# Patient Record
Sex: Female | Born: 1960 | Race: Black or African American | Hispanic: No | Marital: Married | Smoking: Never smoker
Health system: Southern US, Community
[De-identification: ages and names within clinical notes are randomized; demographics above are authoritative.]

## PROBLEM LIST (undated history)

## (undated) ENCOUNTER — Emergency Department (HOSPITAL_COMMUNITY)

## (undated) DIAGNOSIS — M069 Rheumatoid arthritis, unspecified: Secondary | ICD-10-CM

## (undated) DIAGNOSIS — K219 Gastro-esophageal reflux disease without esophagitis: Secondary | ICD-10-CM

## (undated) DIAGNOSIS — E079 Disorder of thyroid, unspecified: Secondary | ICD-10-CM

## (undated) DIAGNOSIS — D239 Other benign neoplasm of skin, unspecified: Secondary | ICD-10-CM

## (undated) DIAGNOSIS — Z833 Family history of diabetes mellitus: Secondary | ICD-10-CM

## (undated) DIAGNOSIS — E039 Hypothyroidism, unspecified: Secondary | ICD-10-CM

## (undated) DIAGNOSIS — Z780 Asymptomatic menopausal state: Secondary | ICD-10-CM

## (undated) DIAGNOSIS — T7840XA Allergy, unspecified, initial encounter: Secondary | ICD-10-CM

## (undated) DIAGNOSIS — E876 Hypokalemia: Secondary | ICD-10-CM

## (undated) DIAGNOSIS — J069 Acute upper respiratory infection, unspecified: Secondary | ICD-10-CM

## (undated) DIAGNOSIS — M25512 Pain in left shoulder: Secondary | ICD-10-CM

## (undated) DIAGNOSIS — Z8249 Family history of ischemic heart disease and other diseases of the circulatory system: Secondary | ICD-10-CM

## (undated) DIAGNOSIS — D649 Anemia, unspecified: Secondary | ICD-10-CM

## (undated) DIAGNOSIS — I10 Essential (primary) hypertension: Secondary | ICD-10-CM

## (undated) DIAGNOSIS — R209 Unspecified disturbances of skin sensation: Secondary | ICD-10-CM

## (undated) DIAGNOSIS — IMO0002 Reserved for concepts with insufficient information to code with codable children: Secondary | ICD-10-CM

## (undated) DIAGNOSIS — G4730 Sleep apnea, unspecified: Secondary | ICD-10-CM

## (undated) DIAGNOSIS — Z8049 Family history of malignant neoplasm of other genital organs: Secondary | ICD-10-CM

## (undated) DIAGNOSIS — K5730 Diverticulosis of large intestine without perforation or abscess without bleeding: Secondary | ICD-10-CM

## (undated) DIAGNOSIS — S139XXA Sprain of joints and ligaments of unspecified parts of neck, initial encounter: Secondary | ICD-10-CM

## (undated) DIAGNOSIS — R109 Unspecified abdominal pain: Secondary | ICD-10-CM

## (undated) HISTORY — DX: Pain in left shoulder: M25.512

## (undated) HISTORY — DX: Acute upper respiratory infection, unspecified: J06.9

## (undated) HISTORY — DX: Hypokalemia: E87.6

## (undated) HISTORY — DX: Allergy, unspecified, initial encounter: T78.40XA

## (undated) HISTORY — DX: Essential (primary) hypertension: I10

## (undated) HISTORY — PX: SHOULDER SURGERY: SHX246

## (undated) HISTORY — PX: ABDOMINAL HYSTERECTOMY: SHX81

## (undated) HISTORY — DX: Family history of diabetes mellitus: Z83.3

## (undated) HISTORY — DX: Sprain of joints and ligaments of unspecified parts of neck, initial encounter: S13.9XXA

## (undated) HISTORY — DX: Rheumatoid arthritis, unspecified: M06.9

## (undated) HISTORY — PX: COLONOSCOPY: SHX174

## (undated) HISTORY — DX: Family history of ischemic heart disease and other diseases of the circulatory system: Z82.49

## (undated) HISTORY — DX: Gastro-esophageal reflux disease without esophagitis: K21.9

## (undated) HISTORY — DX: Unspecified disturbances of skin sensation: R20.9

## (undated) HISTORY — DX: Unspecified abdominal pain: R10.9

## (undated) HISTORY — DX: Family history of malignant neoplasm of other genital organs: Z80.49

## (undated) HISTORY — DX: Diverticulosis of large intestine without perforation or abscess without bleeding: K57.30

## (undated) HISTORY — DX: Asymptomatic menopausal state: Z78.0

## (undated) HISTORY — DX: Other benign neoplasm of skin, unspecified: D23.9

## (undated) HISTORY — DX: Sleep apnea, unspecified: G47.30

## (undated) HISTORY — DX: Reserved for concepts with insufficient information to code with codable children: IMO0002

## (undated) HISTORY — DX: Anemia, unspecified: D64.9

## (undated) HISTORY — DX: Disorder of thyroid, unspecified: E07.9

---

## 2000-04-19 HISTORY — PX: TOTAL ABDOMINAL HYSTERECTOMY W/ BILATERAL SALPINGOOPHORECTOMY: SHX83

## 2002-08-08 ENCOUNTER — Other Ambulatory Visit: Admission: RE | Admit: 2002-08-08 | Discharge: 2002-08-08 | Payer: Self-pay | Admitting: Gynecology

## 2002-08-29 ENCOUNTER — Encounter: Admission: RE | Admit: 2002-08-29 | Discharge: 2002-08-29 | Payer: Self-pay | Admitting: Gynecology

## 2002-08-29 ENCOUNTER — Encounter: Payer: Self-pay | Admitting: Gynecology

## 2003-01-03 ENCOUNTER — Inpatient Hospital Stay (HOSPITAL_COMMUNITY): Admission: AD | Admit: 2003-01-03 | Discharge: 2003-01-06 | Payer: Self-pay | Admitting: Obstetrics & Gynecology

## 2003-01-03 ENCOUNTER — Encounter (INDEPENDENT_AMBULATORY_CARE_PROVIDER_SITE_OTHER): Payer: Self-pay

## 2003-04-20 HISTORY — PX: BUNIONECTOMY: SHX129

## 2003-11-20 ENCOUNTER — Other Ambulatory Visit: Admission: RE | Admit: 2003-11-20 | Discharge: 2003-11-20 | Payer: Self-pay | Admitting: Obstetrics & Gynecology

## 2004-03-11 ENCOUNTER — Inpatient Hospital Stay (HOSPITAL_COMMUNITY): Admission: AD | Admit: 2004-03-11 | Discharge: 2004-03-17 | Payer: Self-pay | Admitting: Obstetrics & Gynecology

## 2004-03-11 ENCOUNTER — Encounter (INDEPENDENT_AMBULATORY_CARE_PROVIDER_SITE_OTHER): Payer: Self-pay | Admitting: *Deleted

## 2004-03-12 ENCOUNTER — Encounter (INDEPENDENT_AMBULATORY_CARE_PROVIDER_SITE_OTHER): Payer: Self-pay | Admitting: *Deleted

## 2004-03-12 ENCOUNTER — Encounter (INDEPENDENT_AMBULATORY_CARE_PROVIDER_SITE_OTHER): Payer: Self-pay | Admitting: Specialist

## 2004-12-22 ENCOUNTER — Other Ambulatory Visit: Admission: RE | Admit: 2004-12-22 | Discharge: 2004-12-22 | Payer: Self-pay | Admitting: Obstetrics & Gynecology

## 2006-01-07 ENCOUNTER — Ambulatory Visit: Payer: Self-pay | Admitting: Family Medicine

## 2006-01-17 ENCOUNTER — Ambulatory Visit: Payer: Self-pay | Admitting: Family Medicine

## 2006-01-28 ENCOUNTER — Ambulatory Visit: Payer: Self-pay | Admitting: Family Medicine

## 2006-02-04 ENCOUNTER — Ambulatory Visit: Payer: Self-pay | Admitting: Family Medicine

## 2006-02-07 ENCOUNTER — Ambulatory Visit: Payer: Self-pay | Admitting: Family Medicine

## 2006-02-07 LAB — CONVERTED CEMR LAB
BUN: 13 mg/dL (ref 6–23)
CO2: 29 meq/L (ref 19–32)
Calcium: 8.6 mg/dL (ref 8.4–10.5)
Chloride: 108 meq/L (ref 96–112)
Creatinine, Ser: 1.2 mg/dL (ref 0.4–1.2)
GFR calc non Af Amer: 52 mL/min
Glomerular Filtration Rate, Af Am: 62 mL/min/{1.73_m2}
Glucose, Bld: 106 mg/dL — ABNORMAL HIGH (ref 70–99)
Potassium: 3 meq/L — ABNORMAL LOW (ref 3.5–5.1)
Sodium: 144 meq/L (ref 135–145)

## 2006-02-16 ENCOUNTER — Ambulatory Visit: Payer: Self-pay | Admitting: Family Medicine

## 2006-02-16 LAB — CONVERTED CEMR LAB: Potassium: 3.8 meq/L (ref 3.5–5.1)

## 2006-03-03 ENCOUNTER — Ambulatory Visit: Payer: Self-pay | Admitting: Family Medicine

## 2006-03-03 LAB — CONVERTED CEMR LAB
BUN: 15 mg/dL (ref 6–23)
CO2: 27 meq/L (ref 19–32)
Calcium: 8.9 mg/dL (ref 8.4–10.5)
Chloride: 106 meq/L (ref 96–112)
Creatinine, Ser: 1.1 mg/dL (ref 0.4–1.2)
GFR calc non Af Amer: 57 mL/min
Glomerular Filtration Rate, Af Am: 69 mL/min/{1.73_m2}
Glucose, Bld: 74 mg/dL (ref 70–99)
H Pylori IgG: NEGATIVE
Potassium: 4.1 meq/L (ref 3.5–5.1)
Sodium: 140 meq/L (ref 135–145)

## 2006-03-23 ENCOUNTER — Encounter: Admission: RE | Admit: 2006-03-23 | Discharge: 2006-06-21 | Payer: Self-pay | Admitting: Internal Medicine

## 2006-04-25 ENCOUNTER — Ambulatory Visit: Payer: Self-pay | Admitting: Family Medicine

## 2006-05-18 DIAGNOSIS — I10 Essential (primary) hypertension: Secondary | ICD-10-CM | POA: Insufficient documentation

## 2006-06-22 ENCOUNTER — Ambulatory Visit: Payer: Self-pay | Admitting: Family Medicine

## 2006-07-01 ENCOUNTER — Ambulatory Visit: Payer: Self-pay | Admitting: Family Medicine

## 2006-07-07 ENCOUNTER — Encounter: Admission: RE | Admit: 2006-07-07 | Discharge: 2006-07-07 | Payer: Self-pay | Admitting: Family Medicine

## 2006-07-28 ENCOUNTER — Ambulatory Visit: Payer: Self-pay | Admitting: Family Medicine

## 2006-07-28 LAB — CONVERTED CEMR LAB
BUN: 15 mg/dL (ref 6–23)
CO2: 29 meq/L (ref 19–32)
Calcium: 9 mg/dL (ref 8.4–10.5)
Chloride: 108 meq/L (ref 96–112)
Creatinine, Ser: 0.9 mg/dL (ref 0.4–1.2)
GFR calc Af Amer: 87 mL/min
GFR calc non Af Amer: 72 mL/min
Glucose, Bld: 81 mg/dL (ref 70–99)
Potassium: 3.9 meq/L (ref 3.5–5.1)
Sodium: 143 meq/L (ref 135–145)

## 2006-08-26 ENCOUNTER — Ambulatory Visit: Payer: Self-pay | Admitting: Internal Medicine

## 2006-09-28 ENCOUNTER — Telehealth (INDEPENDENT_AMBULATORY_CARE_PROVIDER_SITE_OTHER): Payer: Self-pay | Admitting: Family Medicine

## 2007-01-10 ENCOUNTER — Encounter (INDEPENDENT_AMBULATORY_CARE_PROVIDER_SITE_OTHER): Payer: Self-pay | Admitting: Family Medicine

## 2007-01-13 ENCOUNTER — Ambulatory Visit: Payer: Self-pay | Admitting: Family Medicine

## 2007-01-16 ENCOUNTER — Telehealth (INDEPENDENT_AMBULATORY_CARE_PROVIDER_SITE_OTHER): Payer: Self-pay | Admitting: *Deleted

## 2007-01-16 LAB — CONVERTED CEMR LAB
BUN: 14 mg/dL (ref 6–23)
CO2: 28 meq/L (ref 19–32)
Calcium: 9.2 mg/dL (ref 8.4–10.5)
Chloride: 107 meq/L (ref 96–112)
Creatinine, Ser: 0.8 mg/dL (ref 0.4–1.2)
GFR calc Af Amer: 99 mL/min
GFR calc non Af Amer: 82 mL/min
Glucose, Bld: 95 mg/dL (ref 70–99)
Potassium: 3.5 meq/L (ref 3.5–5.1)
Sodium: 142 meq/L (ref 135–145)

## 2007-02-17 ENCOUNTER — Ambulatory Visit: Payer: Self-pay | Admitting: Family Medicine

## 2007-02-24 ENCOUNTER — Encounter (INDEPENDENT_AMBULATORY_CARE_PROVIDER_SITE_OTHER): Payer: Self-pay | Admitting: *Deleted

## 2007-02-24 ENCOUNTER — Telehealth (INDEPENDENT_AMBULATORY_CARE_PROVIDER_SITE_OTHER): Payer: Self-pay | Admitting: *Deleted

## 2007-02-24 LAB — CONVERTED CEMR LAB
BUN: 11 mg/dL (ref 6–23)
CO2: 31 meq/L (ref 19–32)
Calcium: 8.9 mg/dL (ref 8.4–10.5)
Chloride: 108 meq/L (ref 96–112)
Creatinine, Ser: 0.8 mg/dL (ref 0.4–1.2)
GFR calc Af Amer: 99 mL/min
GFR calc non Af Amer: 82 mL/min
Glucose, Bld: 77 mg/dL (ref 70–99)
Potassium: 3.7 meq/L (ref 3.5–5.1)
Sodium: 142 meq/L (ref 135–145)

## 2007-04-28 ENCOUNTER — Telehealth (INDEPENDENT_AMBULATORY_CARE_PROVIDER_SITE_OTHER): Payer: Self-pay | Admitting: *Deleted

## 2007-04-28 ENCOUNTER — Ambulatory Visit: Payer: Self-pay | Admitting: Family Medicine

## 2007-04-28 DIAGNOSIS — J069 Acute upper respiratory infection, unspecified: Secondary | ICD-10-CM | POA: Insufficient documentation

## 2007-04-29 LAB — CONVERTED CEMR LAB
BUN: 15 mg/dL (ref 6–23)
CO2: 29 meq/L (ref 19–32)
Calcium: 9 mg/dL (ref 8.4–10.5)
Chloride: 102 meq/L (ref 96–112)
Creatinine, Ser: 0.9 mg/dL (ref 0.4–1.2)
GFR calc Af Amer: 87 mL/min
GFR calc non Af Amer: 72 mL/min
Glucose, Bld: 80 mg/dL (ref 70–99)
Potassium: 3.4 meq/L — ABNORMAL LOW (ref 3.5–5.1)
Sodium: 139 meq/L (ref 135–145)

## 2007-05-15 ENCOUNTER — Telehealth (INDEPENDENT_AMBULATORY_CARE_PROVIDER_SITE_OTHER): Payer: Self-pay | Admitting: *Deleted

## 2007-05-17 ENCOUNTER — Telehealth (INDEPENDENT_AMBULATORY_CARE_PROVIDER_SITE_OTHER): Payer: Self-pay | Admitting: *Deleted

## 2007-05-17 ENCOUNTER — Encounter (INDEPENDENT_AMBULATORY_CARE_PROVIDER_SITE_OTHER): Payer: Self-pay | Admitting: Family Medicine

## 2007-05-17 ENCOUNTER — Telehealth (INDEPENDENT_AMBULATORY_CARE_PROVIDER_SITE_OTHER): Payer: Self-pay | Admitting: Family Medicine

## 2007-06-27 ENCOUNTER — Ambulatory Visit: Payer: Self-pay | Admitting: Family Medicine

## 2007-08-24 ENCOUNTER — Encounter: Payer: Self-pay | Admitting: Family Medicine

## 2007-11-02 ENCOUNTER — Ambulatory Visit: Payer: Self-pay | Admitting: Family Medicine

## 2008-01-23 ENCOUNTER — Emergency Department (HOSPITAL_COMMUNITY): Admission: EM | Admit: 2008-01-23 | Discharge: 2008-01-24 | Payer: Self-pay | Admitting: Emergency Medicine

## 2008-01-29 ENCOUNTER — Ambulatory Visit: Payer: Self-pay | Admitting: Family Medicine

## 2008-01-29 DIAGNOSIS — R209 Unspecified disturbances of skin sensation: Secondary | ICD-10-CM | POA: Insufficient documentation

## 2008-01-29 DIAGNOSIS — S139XXA Sprain of joints and ligaments of unspecified parts of neck, initial encounter: Secondary | ICD-10-CM | POA: Insufficient documentation

## 2008-01-29 DIAGNOSIS — IMO0002 Reserved for concepts with insufficient information to code with codable children: Secondary | ICD-10-CM | POA: Insufficient documentation

## 2008-04-29 ENCOUNTER — Encounter: Payer: Self-pay | Admitting: Family Medicine

## 2008-04-30 ENCOUNTER — Ambulatory Visit: Payer: Self-pay | Admitting: Family Medicine

## 2008-04-30 DIAGNOSIS — D239 Other benign neoplasm of skin, unspecified: Secondary | ICD-10-CM | POA: Insufficient documentation

## 2008-05-01 ENCOUNTER — Encounter: Payer: Self-pay | Admitting: Family Medicine

## 2008-05-03 ENCOUNTER — Encounter (INDEPENDENT_AMBULATORY_CARE_PROVIDER_SITE_OTHER): Payer: Self-pay | Admitting: *Deleted

## 2008-05-13 ENCOUNTER — Encounter: Payer: Self-pay | Admitting: Family Medicine

## 2008-05-14 ENCOUNTER — Ambulatory Visit: Payer: Self-pay | Admitting: Family Medicine

## 2008-05-14 LAB — CONVERTED CEMR LAB
OCCULT 1: NEGATIVE
OCCULT 2: NEGATIVE
OCCULT 3: NEGATIVE

## 2008-05-15 ENCOUNTER — Encounter (INDEPENDENT_AMBULATORY_CARE_PROVIDER_SITE_OTHER): Payer: Self-pay | Admitting: *Deleted

## 2008-05-16 ENCOUNTER — Telehealth (INDEPENDENT_AMBULATORY_CARE_PROVIDER_SITE_OTHER): Payer: Self-pay | Admitting: *Deleted

## 2008-06-14 ENCOUNTER — Encounter: Payer: Self-pay | Admitting: Family Medicine

## 2008-07-10 ENCOUNTER — Encounter: Payer: Self-pay | Admitting: Internal Medicine

## 2008-07-31 ENCOUNTER — Ambulatory Visit: Payer: Self-pay | Admitting: Family Medicine

## 2008-07-31 DIAGNOSIS — Z862 Personal history of diseases of the blood and blood-forming organs and certain disorders involving the immune mechanism: Secondary | ICD-10-CM | POA: Insufficient documentation

## 2008-07-31 DIAGNOSIS — Z8639 Personal history of other endocrine, nutritional and metabolic disease: Secondary | ICD-10-CM

## 2008-08-01 LAB — CONVERTED CEMR LAB
BUN: 15 mg/dL (ref 6–23)
CO2: 31 meq/L (ref 19–32)
Calcium: 8.8 mg/dL (ref 8.4–10.5)
Chloride: 107 meq/L (ref 96–112)
Creatinine, Ser: 1 mg/dL (ref 0.4–1.2)
GFR calc non Af Amer: 76.24 mL/min (ref >60)
Glucose, Bld: 84 mg/dL (ref 70–99)
Potassium: 3.7 meq/L (ref 3.5–5.1)
Sodium: 143 meq/L (ref 135–145)

## 2008-08-02 ENCOUNTER — Telehealth (INDEPENDENT_AMBULATORY_CARE_PROVIDER_SITE_OTHER): Payer: Self-pay | Admitting: *Deleted

## 2008-08-02 ENCOUNTER — Encounter (INDEPENDENT_AMBULATORY_CARE_PROVIDER_SITE_OTHER): Payer: Self-pay | Admitting: *Deleted

## 2008-09-03 ENCOUNTER — Encounter: Payer: Self-pay | Admitting: Family Medicine

## 2008-09-13 DIAGNOSIS — K5730 Diverticulosis of large intestine without perforation or abscess without bleeding: Secondary | ICD-10-CM | POA: Insufficient documentation

## 2008-09-13 DIAGNOSIS — R1010 Upper abdominal pain, unspecified: Secondary | ICD-10-CM | POA: Insufficient documentation

## 2008-09-13 DIAGNOSIS — R109 Unspecified abdominal pain: Secondary | ICD-10-CM | POA: Insufficient documentation

## 2008-09-19 ENCOUNTER — Ambulatory Visit: Payer: Self-pay | Admitting: Internal Medicine

## 2008-09-19 DIAGNOSIS — R109 Unspecified abdominal pain: Secondary | ICD-10-CM | POA: Insufficient documentation

## 2008-09-25 ENCOUNTER — Ambulatory Visit: Payer: Self-pay | Admitting: Internal Medicine

## 2008-09-25 LAB — HM COLONOSCOPY

## 2008-12-11 ENCOUNTER — Ambulatory Visit: Payer: Self-pay | Admitting: Family Medicine

## 2008-12-11 DIAGNOSIS — R519 Headache, unspecified: Secondary | ICD-10-CM | POA: Insufficient documentation

## 2008-12-11 DIAGNOSIS — R51 Headache: Secondary | ICD-10-CM | POA: Insufficient documentation

## 2008-12-11 DIAGNOSIS — R42 Dizziness and giddiness: Secondary | ICD-10-CM | POA: Insufficient documentation

## 2008-12-11 LAB — CONVERTED CEMR LAB
BUN: 17 mg/dL (ref 6–23)
Basophils Absolute: 0.1 10*3/uL (ref 0.0–0.1)
Basophils Relative: 1.3 % (ref 0.0–3.0)
CO2: 31 meq/L (ref 19–32)
Calcium: 8.8 mg/dL (ref 8.4–10.5)
Chloride: 107 meq/L (ref 96–112)
Creatinine, Ser: 0.9 mg/dL (ref 0.4–1.2)
Eosinophils Absolute: 0.1 10*3/uL (ref 0.0–0.7)
Eosinophils Relative: 2.3 % (ref 0.0–5.0)
GFR calc non Af Amer: 85.97 mL/min (ref >60)
Glucose, Bld: 76 mg/dL (ref 70–99)
HCT: 36.3 % (ref 36.0–46.0)
Hemoglobin: 12 g/dL (ref 12.0–15.0)
Lymphocytes Relative: 36.6 % (ref 12.0–46.0)
Lymphs Abs: 1.5 10*3/uL (ref 0.7–4.0)
MCHC: 33.2 g/dL (ref 30.0–36.0)
MCV: 86.7 fL (ref 78.0–100.0)
Monocytes Absolute: 0.3 10*3/uL (ref 0.1–1.0)
Monocytes Relative: 6.6 % (ref 3.0–12.0)
Neutro Abs: 2.2 10*3/uL (ref 1.4–7.7)
Neutrophils Relative %: 53.2 % (ref 43.0–77.0)
Platelets: 280 10*3/uL (ref 150.0–400.0)
Potassium: 3.6 meq/L (ref 3.5–5.1)
RBC: 4.19 M/uL (ref 3.87–5.11)
RDW: 13.1 % (ref 11.5–14.6)
Sed Rate: 9 mm/hr (ref 0–22)
Sodium: 142 meq/L (ref 135–145)
TSH: 1.7 microintl units/mL (ref 0.35–5.50)
WBC: 4.2 10*3/uL — ABNORMAL LOW (ref 4.5–10.5)

## 2008-12-13 ENCOUNTER — Encounter (INDEPENDENT_AMBULATORY_CARE_PROVIDER_SITE_OTHER): Payer: Self-pay | Admitting: *Deleted

## 2008-12-17 ENCOUNTER — Telehealth (INDEPENDENT_AMBULATORY_CARE_PROVIDER_SITE_OTHER): Payer: Self-pay | Admitting: *Deleted

## 2008-12-17 LAB — CONVERTED CEMR LAB

## 2008-12-27 ENCOUNTER — Encounter (INDEPENDENT_AMBULATORY_CARE_PROVIDER_SITE_OTHER): Payer: Self-pay | Admitting: *Deleted

## 2009-01-02 ENCOUNTER — Telehealth (INDEPENDENT_AMBULATORY_CARE_PROVIDER_SITE_OTHER): Payer: Self-pay | Admitting: *Deleted

## 2009-01-21 ENCOUNTER — Ambulatory Visit: Payer: Self-pay | Admitting: Family Medicine

## 2009-02-07 ENCOUNTER — Telehealth (INDEPENDENT_AMBULATORY_CARE_PROVIDER_SITE_OTHER): Payer: Self-pay | Admitting: *Deleted

## 2009-02-11 ENCOUNTER — Encounter: Payer: Self-pay | Admitting: Family Medicine

## 2009-04-23 ENCOUNTER — Ambulatory Visit: Payer: Self-pay | Admitting: Family Medicine

## 2009-04-23 DIAGNOSIS — M1990 Unspecified osteoarthritis, unspecified site: Secondary | ICD-10-CM | POA: Insufficient documentation

## 2009-04-30 ENCOUNTER — Telehealth (INDEPENDENT_AMBULATORY_CARE_PROVIDER_SITE_OTHER): Payer: Self-pay | Admitting: *Deleted

## 2009-04-30 LAB — CONVERTED CEMR LAB
BUN: 14 mg/dL (ref 6–23)
CO2: 30 meq/L (ref 19–32)
Calcium: 9 mg/dL (ref 8.4–10.5)
Chloride: 105 meq/L (ref 96–112)
Creatinine, Ser: 0.9 mg/dL (ref 0.4–1.2)
GFR calc non Af Amer: 85.83 mL/min (ref >60)
Glucose, Bld: 81 mg/dL (ref 70–99)
Potassium: 3.2 meq/L — ABNORMAL LOW (ref 3.5–5.1)
Sodium: 141 meq/L (ref 135–145)

## 2009-05-08 ENCOUNTER — Telehealth (INDEPENDENT_AMBULATORY_CARE_PROVIDER_SITE_OTHER): Payer: Self-pay | Admitting: *Deleted

## 2009-05-13 ENCOUNTER — Telehealth (INDEPENDENT_AMBULATORY_CARE_PROVIDER_SITE_OTHER): Payer: Self-pay | Admitting: *Deleted

## 2009-05-19 ENCOUNTER — Encounter: Payer: Self-pay | Admitting: Family Medicine

## 2009-06-25 LAB — HM MAMMOGRAPHY: HM Mammogram: NORMAL

## 2009-07-02 LAB — CONVERTED CEMR LAB: Pap Smear: NORMAL

## 2009-09-23 ENCOUNTER — Telehealth (INDEPENDENT_AMBULATORY_CARE_PROVIDER_SITE_OTHER): Payer: Self-pay | Admitting: *Deleted

## 2009-09-24 ENCOUNTER — Ambulatory Visit: Payer: Self-pay | Admitting: Family Medicine

## 2009-09-24 DIAGNOSIS — R079 Chest pain, unspecified: Secondary | ICD-10-CM | POA: Insufficient documentation

## 2009-09-24 DIAGNOSIS — R12 Heartburn: Secondary | ICD-10-CM | POA: Insufficient documentation

## 2009-09-30 ENCOUNTER — Encounter (INDEPENDENT_AMBULATORY_CARE_PROVIDER_SITE_OTHER): Payer: Self-pay | Admitting: *Deleted

## 2009-11-03 ENCOUNTER — Ambulatory Visit: Payer: Self-pay | Admitting: Internal Medicine

## 2009-11-04 ENCOUNTER — Ambulatory Visit: Payer: Self-pay | Admitting: Internal Medicine

## 2009-11-06 ENCOUNTER — Ambulatory Visit (HOSPITAL_COMMUNITY): Admission: RE | Admit: 2009-11-06 | Discharge: 2009-11-06 | Payer: Self-pay | Admitting: Internal Medicine

## 2009-11-08 ENCOUNTER — Encounter: Payer: Self-pay | Admitting: Internal Medicine

## 2009-11-27 ENCOUNTER — Encounter: Payer: Self-pay | Admitting: Family Medicine

## 2009-12-25 ENCOUNTER — Telehealth: Payer: Self-pay | Admitting: Family Medicine

## 2010-03-04 ENCOUNTER — Ambulatory Visit: Payer: Self-pay | Admitting: Family Medicine

## 2010-03-04 ENCOUNTER — Encounter: Payer: Self-pay | Admitting: Family Medicine

## 2010-03-04 DIAGNOSIS — IMO0001 Reserved for inherently not codable concepts without codable children: Secondary | ICD-10-CM | POA: Insufficient documentation

## 2010-03-04 DIAGNOSIS — M2550 Pain in unspecified joint: Secondary | ICD-10-CM | POA: Insufficient documentation

## 2010-03-08 LAB — CONVERTED CEMR LAB
ANA Titer 1: 1:160 {titer} — ABNORMAL HIGH
Anti Nuclear Antibody(ANA): POSITIVE — AB
Rheumatoid fact SerPl-aCnc: 21 intl units/mL — ABNORMAL HIGH (ref 0–20)

## 2010-03-09 ENCOUNTER — Telehealth (INDEPENDENT_AMBULATORY_CARE_PROVIDER_SITE_OTHER): Payer: Self-pay | Admitting: *Deleted

## 2010-03-09 LAB — CONVERTED CEMR LAB
ALT: 16 units/L (ref 0–35)
AST: 16 units/L (ref 0–37)
Albumin: 3.7 g/dL (ref 3.5–5.2)
Alkaline Phosphatase: 66 units/L (ref 39–117)
BUN: 20 mg/dL (ref 6–23)
Basophils Absolute: 0 10*3/uL (ref 0.0–0.1)
Basophils Relative: 0.9 % (ref 0.0–3.0)
Bilirubin, Direct: 0.1 mg/dL (ref 0.0–0.3)
CO2: 29 meq/L (ref 19–32)
Calcium: 8.8 mg/dL (ref 8.4–10.5)
Chloride: 101 meq/L (ref 96–112)
Cholesterol: 181 mg/dL (ref 0–200)
Creatinine, Ser: 1 mg/dL (ref 0.4–1.2)
Eosinophils Absolute: 0.1 10*3/uL (ref 0.0–0.7)
Eosinophils Relative: 2 % (ref 0.0–5.0)
GFR calc non Af Amer: 79.39 mL/min (ref >60)
Glucose, Bld: 76 mg/dL (ref 70–99)
HCT: 36.8 % (ref 36.0–46.0)
HDL: 42.1 mg/dL (ref >39.00)
Hemoglobin: 12.2 g/dL (ref 12.0–15.0)
LDL Cholesterol: 114 mg/dL — ABNORMAL HIGH (ref 0–99)
Lymphocytes Relative: 40.4 % (ref 12.0–46.0)
Lymphs Abs: 2 10*3/uL (ref 0.7–4.0)
MCHC: 33.3 g/dL (ref 30.0–36.0)
MCV: 86.3 fL (ref 78.0–100.0)
Monocytes Absolute: 0.3 10*3/uL (ref 0.1–1.0)
Monocytes Relative: 6.5 % (ref 3.0–12.0)
Neutro Abs: 2.5 10*3/uL (ref 1.4–7.7)
Neutrophils Relative %: 50.2 % (ref 43.0–77.0)
Platelets: 290 10*3/uL (ref 150.0–400.0)
Potassium: 3.2 meq/L — ABNORMAL LOW (ref 3.5–5.1)
RBC: 4.26 M/uL (ref 3.87–5.11)
RDW: 13.7 % (ref 11.5–14.6)
Sed Rate: 38 mm/hr — ABNORMAL HIGH (ref 0–22)
Sodium: 139 meq/L (ref 135–145)
TSH: 2 microintl units/mL (ref 0.35–5.50)
Total Bilirubin: 0.7 mg/dL (ref 0.3–1.2)
Total CHOL/HDL Ratio: 4
Total Protein: 7 g/dL (ref 6.0–8.3)
Triglycerides: 123 mg/dL (ref 0.0–149.0)
VLDL: 24.6 mg/dL (ref 0.0–40.0)
WBC: 5 10*3/uL (ref 4.5–10.5)

## 2010-03-19 ENCOUNTER — Encounter: Payer: Self-pay | Admitting: Family Medicine

## 2010-03-27 ENCOUNTER — Encounter: Payer: Self-pay | Admitting: Family Medicine

## 2010-04-23 ENCOUNTER — Encounter: Payer: Self-pay | Admitting: Family Medicine

## 2010-04-23 ENCOUNTER — Ambulatory Visit
Admission: RE | Admit: 2010-04-23 | Discharge: 2010-04-23 | Payer: Self-pay | Source: Home / Self Care | Attending: Family Medicine | Admitting: Family Medicine

## 2010-04-23 LAB — CONVERTED CEMR LAB
Inflenza A Ag: NEGATIVE
Influenza B Ag: NEGATIVE
Rapid Strep: NEGATIVE

## 2010-04-27 ENCOUNTER — Telehealth: Payer: Self-pay | Admitting: Family Medicine

## 2010-05-10 ENCOUNTER — Encounter: Payer: Self-pay | Admitting: Obstetrics & Gynecology

## 2010-05-17 LAB — CONVERTED CEMR LAB
ALT: 22 units/L (ref 0–35)
AST: 18 units/L (ref 0–37)
Albumin: 3.9 g/dL (ref 3.5–5.2)
Alkaline Phosphatase: 70 units/L (ref 39–117)
BUN: 18 mg/dL (ref 6–23)
Basophils Absolute: 0.1 10*3/uL (ref 0.0–0.1)
Basophils Relative: 2.2 % (ref 0.0–3.0)
Bilirubin, Direct: 0.1 mg/dL (ref 0.0–0.3)
CO2: 31 meq/L (ref 19–32)
Calcium: 9.4 mg/dL (ref 8.4–10.5)
Chloride: 103 meq/L (ref 96–112)
Cholesterol: 204 mg/dL (ref 0–200)
Creatinine, Ser: 0.9 mg/dL (ref 0.4–1.2)
Direct LDL: 128.5 mg/dL
Eosinophils Absolute: 0 10*3/uL (ref 0.0–0.7)
Eosinophils Relative: 0.9 % (ref 0.0–5.0)
GFR calc Af Amer: 86 mL/min
GFR calc non Af Amer: 71 mL/min
Glucose, Bld: 86 mg/dL (ref 70–99)
HCT: 38.6 % (ref 36.0–46.0)
HDL: 48.4 mg/dL (ref >39.0)
Hemoglobin: 13 g/dL (ref 12.0–15.0)
Lymphocytes Relative: 36.9 % (ref 12.0–46.0)
MCHC: 33.6 g/dL (ref 30.0–36.0)
MCV: 86.8 fL (ref 78.0–100.0)
Monocytes Absolute: 0.4 10*3/uL (ref 0.1–1.0)
Monocytes Relative: 7 % (ref 3.0–12.0)
Neutro Abs: 2.8 10*3/uL (ref 1.4–7.7)
Neutrophils Relative %: 53 % (ref 43.0–77.0)
Pap Smear: NORMAL
Platelets: 280 10*3/uL (ref 150–400)
Potassium: 3 meq/L — ABNORMAL LOW (ref 3.5–5.1)
RBC: 4.45 M/uL (ref 3.87–5.11)
RDW: 13.3 % (ref 11.5–14.6)
Sodium: 141 meq/L (ref 135–145)
TSH: 2.34 microintl units/mL (ref 0.35–5.50)
Total Bilirubin: 0.8 mg/dL (ref 0.3–1.2)
Total CHOL/HDL Ratio: 4.2
Total Protein: 8.2 g/dL (ref 6.0–8.3)
Triglycerides: 79 mg/dL (ref 0–149)
VLDL: 16 mg/dL (ref 0–40)
WBC: 5.3 10*3/uL (ref 4.5–10.5)

## 2010-05-19 NOTE — Progress Notes (Signed)
Summary: [facility] 9/8  Phone Note Outgoing Call Call back at Work Phone [phone] Call back at [phone]   Call placed by: Almeta Monas CMA Duncan Dull),  December 25, 2009 12:41 PM Details for Reason: Med Verification Summary of Call: Letter rcvd from the Insurance comp advising [place] that pt's meds are not being filled in accordance to direction from MD. Jeanene Erb pt to verify and make sure she is taking Losartan-Hctz 100-25 mg tab as directed. Almeta Monas CMA Duncan Dull)  December 25, 2009 12:43 PM   Follow-up for Phone Call        spk with patient and she said she is not taking her meds properly, says she takes them when she remembers. Advised importance of taking meds properly, pt voiced understanding. She said she was no having any problems and no concerns at the moment, says she will do better at taking meds. Adv pt I will let Dr. Laury Axon know and I may need to call back with further instruction. Pt voiced understanding.  Follow-up by: Almeta Monas CMA Duncan Dull),  December 25, 2009 1:07 PM

## 2010-05-19 NOTE — Progress Notes (Signed)
Summary: REFILL  Phone Note Refill Request Message from:  Fax from Pharmacy on [facility] [address] FAX [phone]  Refills Requested: Medication #1:  KLOR-CON M20 20 MEQ CR-TABS Take TWO  tabletS daily Initial call taken by: Barb Merino,  May 08, 2009 10:49 AM    Prescriptions: KLOR-CON M20 20 MEQ CR-TABS (POTASSIUM CHLORIDE CRYS CR) Take TWO  tabletS daily  #60 x 2   Entered by:   Army Fossa CMA   Authorized by:   Loreen Freud DO   Signed by:   Army Fossa CMA on 05/08/2009   Method used:   [facility]ron[facility]        [facility]  [address] [id number]       [address]       [place], [place]  [address]       Ph: [phone] or [phone]       Fax: [phone]   RxID:   [id number]

## 2010-05-19 NOTE — Progress Notes (Signed)
Summary: REFILL  Phone Note Refill Request Message from:  Fax from Pharmacy on [facility] [address] FAX [phone]  LOSARTAN-HCTZ 100-25MG   Initial call taken by: Barb Merino,  April 30, 2009 11:46 AM    Prescriptions: HYZAAR 100-25 MG TABS (LOSARTAN POTASSIUM-HCTZ) 1 by mouth once daily  #30 x 2   Entered by:   Army Fossa CMA   Authorized by:   Loreen Freud DO   Signed by:   Army Fossa CMA on 04/30/2009   Method used:   Electronically to        [facility]  [address] [id number] (retail)       [address]       [place], [place]  [address]       Ph: [phone] or [phone]       Fax: [phone]   RxID:   [id number]

## 2010-05-19 NOTE — Progress Notes (Signed)
Summary: Results 11/21  Phone Note Outgoing Call   Call placed by: Almeta Monas CMA Duncan Dull),  March 09, 2010 3:49 PM Call placed to: Patient Details for Reason: Results Summary of Call: Ideally your LDL (bad cholesterol) should be <_100___, your HDL (good cholesterol) should be >_50__ and your triglycerides should be< 150.  Diet and exercise will increase HDL and decrease the LDL and triglycerides. Read Dr. Danice Goltz book--Eat Drink and Be Healthy.  We will recheck labs in _3__ months.  We will discuss options then.    K is low----is pt taking potassium supplements?  med list says 2 daily.  She will need to increase to 3 a day if she is.             272,4  401.9  bmp, lipid, hep  + ANA---- with bodyaches----refer to rheumatology  Left message to call back...Marland KitchenMarland KitchenMarland Kitchen Almeta Monas CMA Duncan Dull)  March 09, 2010 3:52 PM   Follow-up for Phone Call        spk with pt and gave her results, pt is only taking 1 potassium pill and will increase to two. Will watch her diet and exercise and recheck labs in 3 mo. Follow-up by: Almeta Monas CMA Duncan Dull),  March 11, 2010 4:08 PM

## 2010-05-19 NOTE — Letter (Signed)
Summary: Patient Notice-Endo Biopsy Results   Gastroenterology  [address] [place], [place] [address]   Phone: [phone]  Fax: [phone]        November 08, 2009 MRN: [id number]    [name] [address] [place], [place]  [address]    Dear [name],  I am pleased to inform you that the biopsies taken during your recent endoscopic examination did not show any evidence of cancer upon pathologic examination.Mild inflammation due to reflux  Additional information/recommendations:  __No further action is needed at this time.  Please follow-up with      your primary care physician for your other healthcare needs.  __ Please call [phone] to schedule a return visit to review      your condition.  _x_ Continue with the treatment plan as outlined on the day of your      exam.  __ You should have a repeat endoscopic examination for this problem              in _ months/years.   Please call us if you are having persistent problems or have questions about your condition that have not been fully answered at this time.  Sincerely,  Hart Carwin MD  This letter has been electronically signed by your physician.  Appended Document: Patient Notice-Endo Biopsy Results letter mailed

## 2010-05-19 NOTE — Progress Notes (Signed)
Summary: Lab Results   Phone Note Outgoing Call   Summary of Call: Regarding lab results, LMTCB with a female:  make sure she is taking her potassium two times a day ---  K is still low  Initial call taken by: Army Fossa CMA,  April 30, 2009 2:02 PM  Follow-up for Phone Call        pt was only taking once daily. instructed pt to take two times a day.

## 2010-05-19 NOTE — Progress Notes (Signed)
Summary: refill  Phone Note Refill Request Message from:  Fax from Pharmacy on [address] fax [phone]  losartan-hcttz 100-25mg    Initial call taken by: Barb Merino,  May 13, 2009 10:10 AM    Prescriptions: HYZAAR 100-25 MG TABS (LOSARTAN POTASSIUM-HCTZ) 1 by mouth once daily  #30 x 2   Entered by:   Army Fossa CMA   Authorized by:   Loreen Freud DO   Signed by:   Army Fossa CMA on 05/13/2009   Method used:   Faxed to ...       [facility]  [address] [id number])       [address]       [place], [place]  [address]       Ph: [phone] or [phone]       Fax: [phone]   RxID:   [id number]

## 2010-05-19 NOTE — Miscellaneous (Signed)
Summary: nexium-rx and carafate rx  Clinical Lists Changes  Medications: Added new medication of NEXIUM 40 MG  CPDR (ESOMEPRAZOLE MAGNESIUM) 1 capsule twice a day 30 minutes before meals - Signed Added new medication of CARAFATE 1 GM/10ML  SUSP (SUCRALFATE) take 10ml by mouth two times a day for 2 weeks - Signed Rx of NEXIUM 40 MG  CPDR (ESOMEPRAZOLE MAGNESIUM) 1 capsule twice a day 30 minutes before meals;  #60 x 1;  Signed;  Entered by: Greer Ee RN;  Authorized by: Hart Carwin MD;  Method used: Electronically [facility]  [address] [id number] [address], [place], [place]  [address], Ph: [phone] or [phone], Fax: [phone] Rx of CARAFATE 1 GM/10ML  SUSP (SUCRALFATE) take 10ml by mouth two times a day for 2 weeks;  #12oz x 1;  Signed;  Entered by: Greer Ee RN;  Authorized by: Hart Carwin MD;  Method used: Electronically to [facility]  [address] [facility] [address] [place], [place], [place]  [address], Ph: [phone] or [phone], Fax: [phone]    Prescriptions: CARAFATE 1 GM/10ML  SUSP (SUCRALFATE) take 10ml by mouth two times a day for 2 weeks  #12oz x 1   Entered by:   Greer Ee RN   Authorized by:   Hart Carwin MD   Signed by:   Greer Ee RN on 11/04/2009   Method used:   Electronically to        [facility]  [address] [id number] (retail)       [address]       [place], [place]  [address]       Ph: [phone] or [phone]       Fax: [phone]   RxID:   [id number] NEXIUM 40 MG  CPDR (ESOMEPRAZOLE MAGNESIUM) 1 capsule twice a day 30 minutes before meals  #60 x 1   Entered by:   Greer Ee RN   Authorized by:   Hart Carwin MD   Signed by:   Greer Ee RN on 11/04/2009   Method used:   Electronically to        [facility]  [address] [id number] (retail)       [address]       [place], [place]  [address]       Ph: [phone] or [phone]       Fax: [phone]   RxID:    [id number]   Appended Document: nexium-rx and carafate rx I have spoken to patient. Patient's insurance will not cover Nexium. However, they will approve Dexilant. I will send a new prescription for the Dexilant to pharmacy and will discontinue prescription for Nexium.   Clinical Lists Changes  Medications: Removed medication of NEXIUM 40 MG CPDR (ESOMEPRAZOLE MAGNESIUM) 1 by mouth once daily Removed medication of NEXIUM 40 MG  CPDR (ESOMEPRAZOLE MAGNESIUM) 1 capsule twice a day 30 minutes before meals Added new medication of DEXILANT 60 MG CPDR (DEXLANSOPRAZOLE) Take 1 tablet by mouth two times a day. PHARMACY-PLEASE D/C NEXIUM RX.Marland KitchenMarland KitchenINSURANCE WILL NOT COVER! - Signed Rx of DEXILANT 60 MG CPDR (DEXLANSOPRAZOLE) Take 1 tablet by mouth two times a day. PHARMACY-PLEASE D/C NEXIUM RX.Marland KitchenMarland KitchenINSURANCE WILL NOT COVER!;  #60 x 1;  Signed;  Entered by: Lamona Curl CMA (AAMA);  Authorized by: Hart Carwin MD;  Method used: Electronically to [facility]  [address] [phone]*, [address] [place], [place], [place]  [address], Ph: [phone] or [phone], Fax: [phone]    Prescriptions: DEXILANT 60 MG CPDR (DEXLANSOPRAZOLE) Take 1 tablet by mouth two times a day. PHARMACY-PLEASE D/C NEXIUM RX.Marland KitchenMarland KitchenINSURANCE WILL NOT COVER!  #60 x 1   Entered by:   Lamona Curl CMA (AAMA)   Authorized by:   Hart Carwin MD   Signed by:   Lamona Curl CMA (AAMA) on 11/05/2009   Method used:   Electronically to        [facility]  [address] [id number] (retail)       [address]       [place], [place]  [address]       Ph: [phone] or [phone]       Fax: [phone]   RxID:   [id number]

## 2010-05-19 NOTE — Assessment & Plan Note (Signed)
Summary: heartburn...as.   History of Present Illness Visit Type: consult  Primary GI MD: Lina Sar MD Primary Provider: Loreen Freud, DO Requesting Provider: Loreen Freud, DO Chief Complaint: GERD History of Present Illness:   This is a 50 year old African American female with a history of gastroesophageal reflux which has progressed over the past few months. She has had heartburn intermittently for many years. She has been overweight. She never smoked. She denies taking anti-inflammatory agents other than an occasional baby aspirin. Dr Laury Axon started the patient on Nexium 40 mg daily which resulted in 80% improvement. There is no family history of gallbladder disease. A CT Scan of the abdomen and pelvis completed last year showed diverticulosis of the left colon. I saw the patient last year for left lower quadrant abdominal pain. She is status post hysterectomy and BSO. A colonoscopy in June 2010 showed moderately severe diverticulosis. She has been treated with Levsin SL 0.125  and Levbid 0.375 mg.   GI Review of Systems    Reports acid reflux and  heartburn.      Denies abdominal pain, belching, bloating, chest pain, dysphagia with liquids, dysphagia with solids, loss of appetite, nausea, vomiting, vomiting blood, weight loss, and  weight gain.        Denies anal fissure, black tarry stools, change in bowel habit, constipation, diarrhea, diverticulosis, fecal incontinence, heme positive stool, hemorrhoids, irritable bowel syndrome, jaundice, light color stool, liver problems, rectal bleeding, and  rectal pain.    Current Medications (verified): 1)  Hyzaar 100-25 Mg Tabs (Losartan Potassium-Hctz) .Marland Kitchen.. 1 By Mouth Once Daily 2)  Klor-Con M20 20 Meq Cr-Tabs (Potassium Chloride Crys Cr) .... Take Two  Tablets Daily 3)  Estradiol 2 Mg Tabs (Estradiol) .... As Needed 4)  Hydrocodone-Acetaminophen 7.5-750 Mg Tabs (Hydrocodone-Acetaminophen) .Marland Kitchen.. 1 By Mouth Every 6 Hours As Needed 5)  Levsin  0.125 Mg  Tabs (Hyoscyamine Sulfate) .... Dissolve 1 Tablets Under Tongue As Need For Abdominal Pain 6)  Nexium 40 Mg Cpdr (Esomeprazole Magnesium) .Marland Kitchen.. 1 By Mouth Once Daily  Allergies (verified): No Known Drug Allergies  Past History:  Past Medical History: Reviewed history from 09/13/2008 and no changes required. Hypertension Post menopausal Left Shoulder pain  DIVERTICULOSIS, COLON (ICD-562.10) PELVIC PAIN, CHRONIC (ICD-789.09) HYPOKALEMIA, HX OF (ICD-V12.2) PREVENTIVE HEALTH CARE (ICD-V70.0) BENIGN NEOPLASM OF SKIN SITE UNSPECIFIED (ICD-216.9) FAMILY HISTORY OF CAD FEMALE 1ST DEGREE RELATIVE <50 (ICD-V17.3) FAMILY HISTORY OF CAD FEMALE 1ST DEGREE RELATIVE <60 (ICD-V16.49) PARESTHESIA, HANDS (ICD-782.0) KNEE SPRAIN, LEFT (ICD-844.9) NECK SPRAIN AND STRAIN (ICD-847.0) URI (ICD-465.9) WELL ADULT EXAM (ICD-V70.0) FAMILY HISTORY DIABETES 1ST DEGREE RELATIVE (ICD-V18.0) HYPERTENSION (ICD-401.9)    Past Surgical History: Reviewed history from 09/19/2008 and no changes required. TAH/BSO secondary to ovarian cyst-2002 Recurrent pelvis cyst-2005 Rt foot (bunionectomy)   left shoulder surgery: spurs  2/09---Dr Woos Hysterectomy  Family History: Reviewed history from 09/13/2008 and no changes required. Family History Diabetes 1st degree relative Family History Hypertension Family History of CAD Female 1st degree relative <52 yo Family History of CAD Female 1st degree relative <35 Maunt--stroke Maunt--breast cancer Mniece--breast Cancer Family History of Stroke F 1st degree relative <60 Dementia Family History High cholesterol No FH of Colon Cancer:  Social History: Reviewed history from 04/30/2008 and no changes required. 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[employer]  Review of Systems  The patient denies allergy/sinus, anemia, anxiety-new, arthritis/joint pain, back pain, blood in urine,  breast changes/lumps, change in vision, confusion, cough, coughing  up blood, depression-new, fainting, fatigue, fever, headaches-new, hearing problems, heart murmur, heart rhythm changes, itching, menstrual pain, muscle pains/cramps, night sweats, nosebleeds, pregnancy symptoms, shortness of breath, skin rash, sleeping problems, sore throat, swelling of feet/legs, swollen lymph glands, thirst - excessive , urination - excessive , urination changes/pain, urine leakage, vision changes, and voice change.         Pertinent positive and negative review of systems were noted in the above HPI. All other ROS was otherwise negative.   Vital Signs:  Patient profile:   50 year old female Height:      68 inches Weight:      278 pounds BMI:     42.42 BSA:     2.35 Pulse rate:   88 / minute Pulse rhythm:   regular BP sitting:   124 / 82  (left arm) Cuff size:   large  Vitals Entered By: Ok Anis CMA (November 03, 2009 8:21 AM)  Physical Exam  General:  very pleasant, alert and oriented. Overweight. Eyes:  PERRLA, no icterus. Mouth:  No deformity or lesions, dentition normal. Neck:  Supple; no masses or thyromegaly. Lungs:  Clear throughout to auscultation. Heart:  Regular rate and rhythm; no murmurs, rubs,  or bruits. Abdomen:  soft abdomen with mild tenderness in the mid epigastrium. Right and left upper quadrants are unremarkable. Lower abdomen was normal. well-healed surgical scar from prior hysterectomy. Extremities:  No clubbing, cyanosis, edema or deformities noted. Skin:  Intact without significant lesions or rashes. Psych:  Alert and cooperative. Normal mood and affect.   Impression & Recommendations:  Problem # 1:  HEARTBURN (ICD-787.1)  chronic intermittent gastroesophageal reflux improved but not resolved on Nexium 40 mg daily. I have asked patient to increase her Nexium to 40 mg twice a day while awaiting upper endoscopy to rule out Barrett's esophagus, hiatal hernia. H. pylori  gastritis. We will also proceed with upper abdominal ultrasound. I have given her a instruction booklet on  gastroesophageal reflux  Orders: Ultrasound Abdomen (UAS) EGD (EGD)  Problem # 2:  ABDOMINAL PAIN, UNSPECIFIED SITE (ICD-789.00) Patient has left lower quadrant abdominal pain improved on antispasmodics. She is status post evaluation with colonoscopy and CT scan in 2010. The pain is most likely due to diverticulosis or adhesions from her prior hysterectomy.  Patient Instructions: 1)  Antireflux measures. 2)  Upper endoscopy and biopsies. 3)  Nexium 40 mg p.o. b.i.d., samples given. 4)  Upper abdominal ultrasound. 5)  Copy sent to : Dr Laury Axon 6)  The medication list was reviewed and reconciled.  All changed / newly prescribed medications were explained.  A complete medication list was provided to the patient / caregiver.

## 2010-05-19 NOTE — Procedures (Signed)
Summary: Upper Endoscopy  Patient: [name] Note: All result statuses are Final unless otherwise noted.  Tests: (1) Upper Endoscopy (EGD)   EGD Upper Endoscopy       DONE     [facility]     [address]     [place], [place]  [address]           ENDOSCOPY PROCEDURE REPORT           PATIENT:  Andrea, Porter  MR#:  [id number]     BIRTHDATE:  July 19, 1960, 48 yrs. old  GENDER:  female           ENDOSCOPIST:  Hedwig Morton. Juanda Chance, MD     Referred by:  Loreen Freud, DO           PROCEDURE DATE:  11/04/2009     PROCEDURE:  EGD with biopsy     ASA CLASS:  Class II     INDICATIONS:  heartburn, GERD           MEDICATIONS:   Versed 7 mg, Fentanyl 75 mcg     TOPICAL ANESTHETIC:  Exactacain Spray           DESCRIPTION OF PROCEDURE:   After the risks benefits and     alternatives of the procedure were thoroughly explained, informed     consent was obtained.  The LB GIF-H180 [id number] endoscope was     introduced through the mouth and advanced to the second portion of     the duodenum, without limitations.  The instrument was slowly     withdrawn as the mucosa was fully examined.     <<PROCEDUREIMAGES>>           The upper, middle, and distal third of the esophagus were     carefully inspected and no abnormalities were noted. The z-line     was well seen at the GEJ. The endoscope was pushed into the fundus     which was normal including a retroflexed view. The antrum,gastric     body, first and second part of the duodenum were unremarkable.     With standard forceps, a biopsy was obtained and sent to pathology     (see image1, image2, image3, and image4). gastric biopsy and g-e     junction biopsy    Retroflexed views revealed no abnormalities.     The scope was then withdrawn from the patient and the procedure     completed.           COMPLICATIONS:  None           ENDOSCOPIC IMPRESSION:     1) Normal EGD     s/p gastric and esophageal biopsies     RECOMMENDATIONS:     1) Await  biopsy results     Nexiem 40 mg po bid x 2 weeks, # 60, 1 refill,     Carafate slurry 10cc po bid x 2 weeks, #12 oz, 1 refill           REPEAT EXAM:  In 0 year(s) for.           ______________________________     Hedwig Morton. Juanda Chance, MD           CC:           n.     eSIGNED:   Hedwig Morton. Latonga Ponder at 11/04/2009 03:36 PM           Camie Patience, [phone]  Note:  An exclamation mark (!) indicates a result that was not dispersed into the flowsheet. Document Creation Date: 11/04/2009 3:38 PM _______________________________________________________________________  (1) Order result status: Final Collection or observation date-time: 11/04/2009 15:20 Requested date-time:  Receipt date-time:  Reported date-time:  Referring Physician:   Ordering Physician: Lina Sar [id number]) Specimen Source:  Source: Launa Grill Order Number: [id number] Lab site:

## 2010-05-19 NOTE — Letter (Signed)
Summary: [facility]  [facility]   Imported By: Lanelle Bal 05/30/2009 10:02:45  _____________________________________________________________________  External Attachment:    Type:   Image     Comment:   External Document

## 2010-05-19 NOTE — Letter (Signed)
Summary: EGD Instructions  [facility]  [address] [place], [place] [address]   Phone: [phone]  Fax: [phone]       [name]    Jan 10, 1961    MRN: [id number]       Procedure Day /Date: 11/04/09 Tuesday     Arrival Time:  1:30 pm     Procedure Time: 2:30 pm     Location of Procedure:                    _ x _ [facility] (4th Floor)  PREPARATION FOR ENDOSCOPY   On 11/04/09 THE DAY OF THE PROCEDURE:  1.   No solid foods, milk or milk products are allowed after midnight the night before your procedure.  2.   Do not drink anything colored red or purple.  Avoid juices with pulp.  No orange juice.  3.  You may drink clear liquids until 12:30 pm, which is 2 hours before your procedure.                                                                                                CLEAR LIQUIDS INCLUDE: Water Jello Ice Popsicles Tea (sugar ok, no milk/cream) Powdered fruit flavored drinks Coffee (sugar ok, no milk/cream) Gatorade Juice: apple, white grape, white cranberry  Lemonade Clear bullion, consomm, broth Carbonated beverages (any kind) Strained chicken noodle soup Hard Candy   MEDICATION INSTRUCTIONS  Unless otherwise instructed, you should take regular prescription medications with a small sip of water as early as possible the morning of your procedure.                   OTHER INSTRUCTIONS  You will need a responsible adult at least 50 years of age to accompany you and drive you home.   This person must remain in the waiting room during your procedure.  Wear loose fitting clothing that is easily removed.  Leave jewelry and other valuables at home.  However, you may wish to bring a book to read or an iPod/MP3 player to listen to music as you wait for your procedure to start.  Remove all body piercing jewelry and leave at home.  Total time from sign-in until discharge is approximately 2-3 hours.  You should go  home directly after your procedure and rest.  You can resume normal activities the day after your procedure.  The day of your procedure you should not:   Drive   Make legal decisions   Operate machinery   Drink alcohol   Return to work  You will receive specific instructions about eating, activities and medications before you leave.    The above instructions have been reviewed and explained to me by  Lamona Curl CMA Duncan Dull)  November 03, 2009 8:48 AM     I fully understand and can verbalize these instructions _____________________________ Date 11/03/09

## 2010-05-19 NOTE — Progress Notes (Signed)
Summary: reflux  Phone Note Call from Patient Call back at [phone]   Caller: Patient Summary of Call: pt called c/o reflux real bad  OV sched for am. .Kandice Hams  September 23, 2009 3:57 PM  Initial call taken by: Kandice Hams,  September 23, 2009 3:57 PM

## 2010-05-19 NOTE — Assessment & Plan Note (Signed)
Summary: 3 MONTH FOLLOW UP//PH   Vital Signs:  Patient profile:   50 year old female Weight:      277 pounds Temp:     98.1 degrees F oral Pulse rate:   80 / minute Pulse rhythm:   regular BP sitting:   124 / 82  (left arm) Cuff size:   large  Vitals Entered By: Army Fossa CMA (April 23, 2009 10:00 AM) CC: 3 month follow up.    History of Present Illness:  Hypertension follow-up      This is a 50 year old woman who presents for Hypertension follow-up.  The patient denies lightheadedness, urinary frequency, headaches, edema, impotence, rash, and fatigue.  The patient denies the following associated symptoms: chest pain, chest pressure, exercise intolerance, dyspnea, palpitations, syncope, leg edema, and pedal edema.  Compliance with medications (by patient report) has been near 100%.  The patient reports that dietary compliance has been fair.  The patient reports exercising daily.  Adjunctive measures currently used by the patient include salt restriction.    Preventive Screening-Counseling & Management  Caffeine-Diet-Exercise     Does Patient Exercise: yes     Type of exercise: walking     Times/week: 5  Current Medications (verified): 1)  Hyzaar 100-25 Mg Tabs (Losartan Potassium-Hctz) .Marland Kitchen.. 1 By Mouth Once Daily 2)  Klor-Con M20 20 Meq Cr-Tabs (Potassium Chloride Crys Cr) .... Take Two  Tablets Daily 3)  Estradiol 2 Mg Tabs (Estradiol) .... As Needed 4)  Hydrocodone-Acetaminophen 7.5-750 Mg Tabs (Hydrocodone-Acetaminophen) .... As Needed 5)  Levsin 0.125 Mg  Tabs (Hyoscyamine Sulfate) .... Dissolve 1 Tablets Under Tongue As Need For Abdominal Pain 6)  Levbid 0.375 Mg  Tb12 (Hyoscyamine Sulfate) .... Take 1 Tablet Once Daily 7)  Flexeril 10 Mg Tabs (Cyclobenzaprine Hcl) .Marland Kitchen.. 1 By Mouth Three Times A Day As Needed 8)  Valtrex 1 Gm Tabs (Valacyclovir Hcl) .... Take 1 Tab  Daily  Allergies (verified): No Known Drug Allergies  Past History:  Past medical, surgical,  family and social histories (including risk factors) reviewed for relevance to current acute and chronic problems.  Past Medical History: Reviewed history from 09/13/2008 and no changes required. Hypertension Post menopausal Left Shoulder pain  DIVERTICULOSIS, COLON (ICD-562.10) PELVIC PAIN, CHRONIC (ICD-789.09) HYPOKALEMIA, HX OF (ICD-V12.2) PREVENTIVE HEALTH CARE (ICD-V70.0) BENIGN NEOPLASM OF SKIN SITE UNSPECIFIED (ICD-216.9) FAMILY HISTORY OF CAD FEMALE 1ST DEGREE RELATIVE <50 (ICD-V17.3) FAMILY HISTORY OF CAD FEMALE 1ST DEGREE RELATIVE <60 (ICD-V16.49) PARESTHESIA, HANDS (ICD-782.0) KNEE SPRAIN, LEFT (ICD-844.9) NECK SPRAIN AND STRAIN (ICD-847.0) URI (ICD-465.9) WELL ADULT EXAM (ICD-V70.0) FAMILY HISTORY DIABETES 1ST DEGREE RELATIVE (ICD-V18.0) HYPERTENSION (ICD-401.9)    Past Surgical History: Reviewed history from 09/19/2008 and no changes required. TAH/BSO secondary to ovarian cyst-2002 Recurrent pelvis cyst-2005 Rt foot (bunionectomy)   left shoulder surgery: spurs  2/09---Dr Woos Hysterectomy  Family History: Reviewed history from 09/13/2008 and no changes required. Family History Diabetes 1st degree relative Family History Hypertension Family History of CAD Female 1st degree relative <52 yo Family History of CAD Female 1st degree relative <35 Maunt--stroke Maunt--breast cancer Mniece--breast Cancer Family History of Stroke F 1st degree relative <60 Dementia Family History High cholesterol No FH of Colon Cancer:  Social History: Reviewed history from 04/30/2008 and no changes required. 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 Lungs are clear to auscultation, no crackles or wheezes. Heart:  normal rate and no murmur.   Extremities:  No clubbing, cyanosis, edema, or deformity noted with normal full range of motion of all joints.     Impression & Recommendations:  Problem # 1:  HYPERTENSION (ICD-401.9)  Her updated medication list for this problem includes:    Hyzaar 100-25 Mg Tabs (Losartan potassium-hctz) .Marland Kitchen... 1 by mouth once daily  BP today: 124/82 Prior BP: 128/90 (01/21/2009)  Labs Reviewed: K+: 3.6 (12/11/2008) Creat: : 0.9 (12/11/2008)   Chol: 204 (04/30/2008)   HDL: 48.4 (04/30/2008)   LDL: DEL (04/30/2008)   TG: 79 (04/30/2008)  Orders: Venipuncture (09811) TLB-BMP (Basic Metabolic Panel-BMET) (80048-METABOL)  Problem # 2:  KNEE PAIN, RIGHT (ICD-719.46) Con't f/u with ortho Her updated medication list for this problem includes:    Hydrocodone-acetaminophen 7.5-750 Mg Tabs (Hydrocodone-acetaminophen) .Marland Kitchen... 1 by mouth every 6 hours as needed    Flexeril 10 Mg Tabs (Cyclobenzaprine hcl) .Marland Kitchen... 1 by mouth three times a day as needed  Discussed strengthening exercises, use of ice or heat, and medications.   Complete Medication List: 1)  Hyzaar 100-25 Mg Tabs (Losartan potassium-hctz) .Marland Kitchen.. 1 by mouth once daily 2)  Klor-con M20 20 Meq Cr-tabs (Potassium chloride crys cr) .... Take two  tablets daily 3)  Estradiol 2 Mg Tabs (Estradiol) .... As needed 4)  Hydrocodone-acetaminophen 7.5-750 Mg Tabs (Hydrocodone-acetaminophen) .Marland Kitchen.. 1 by mouth every 6 hours as needed 5)  Levsin 0.125 Mg Tabs (Hyoscyamine sulfate) .... Dissolve 1 tablets under tongue as need for abdominal pain 6)  Levbid 0.375 Mg Tb12 (Hyoscyamine sulfate) .... Take 1 tablet once daily 7)  Flexeril 10 Mg Tabs (Cyclobenzaprine hcl) .Marland Kitchen.. 1 by mouth three times a day as needed 8)  Valtrex 1 Gm Tabs (Valacyclovir hcl) .... Take 1 tab  daily Prescriptions: HYDROCODONE-ACETAMINOPHEN 7.5-750 MG TABS (HYDROCODONE-ACETAMINOPHEN) 1  by mouth every 6 hours as needed  #30 x 0   Entered and Authorized by:   Loreen Freud DO   Signed by:   Loreen Freud DO on 04/23/2009   Method used:   Print then Give to Patient   RxID:   [id number]

## 2010-05-19 NOTE — Assessment & Plan Note (Signed)
Summary: reflux/alr   Vital Signs:  Patient profile:   50 year old female Height:      68 inches Weight:      281 pounds BMI:     42.88 Pulse rate:   85 / minute Pulse rhythm:   regular BP sitting:   124 / 78  (left arm) Cuff size:   large  Vitals Entered By: Army Fossa CMA (September 24, 2009 11:05 AM) CC: Pt here c/o Acid Reflux, ongoing problem., Heartburn   History of Present Illness:  Heartburn      This is a 50 year old woman who presents with Heartburn.  The symptoms began 1 day ago.  Pt states she feels like the food gets stuck when she eats.  It started yesterday after a cup of coffee.  She did not get relief until she had some ginger ale and burped.  Pt did not take any otc.  She used to take nexium.  The patient complains of acid reflux and chest pain, but denies sour taste in mouth, epigastric pain, trouble swallowing, weight loss, and weight gain.  The patient denies the following alarm features: melena, dysphagia, hematemesis, vomiting, involuntary weight loss >5%, and history of anemia.  Symptoms are worse with spicy foods and citrus.  Prior evaluation has included no diagnostic studies.    Current Medications (verified): 1)  Hyzaar 100-25 Mg Tabs (Losartan Potassium-Hctz) .Marland Kitchen.. 1 By Mouth Once Daily 2)  Klor-Con M20 20 Meq Cr-Tabs (Potassium Chloride Crys Cr) .... Take Two  Tablets Daily 3)  Estradiol 2 Mg Tabs (Estradiol) .... As Needed 4)  Hydrocodone-Acetaminophen 7.5-750 Mg Tabs (Hydrocodone-Acetaminophen) .Marland Kitchen.. 1 By Mouth Every 6 Hours As Needed 5)  Levsin 0.125 Mg  Tabs (Hyoscyamine Sulfate) .... Dissolve 1 Tablets Under Tongue As Need For Abdominal Pain 6)  Nexium 40 Mg Cpdr (Esomeprazole Magnesium) .Marland Kitchen.. 1 By Mouth Once Daily  Allergies (verified): No Known Drug Allergies  Past History:  Past medical, surgical, family and social histories (including risk factors) reviewed for relevance to current acute and chronic problems.  Past Medical History: Reviewed  history from 09/13/2008 and no changes required. Hypertension Post menopausal Left Shoulder pain  DIVERTICULOSIS, COLON (ICD-562.10) PELVIC PAIN, CHRONIC (ICD-789.09) HYPOKALEMIA, HX OF (ICD-V12.2) PREVENTIVE HEALTH CARE (ICD-V70.0) BENIGN NEOPLASM OF SKIN SITE UNSPECIFIED (ICD-216.9) FAMILY HISTORY OF CAD FEMALE 1ST DEGREE RELATIVE <50 (ICD-V17.3) FAMILY HISTORY OF CAD FEMALE 1ST DEGREE RELATIVE <60 (ICD-V16.49) PARESTHESIA, HANDS (ICD-782.0) KNEE SPRAIN, LEFT (ICD-844.9) NECK SPRAIN AND STRAIN (ICD-847.0) URI (ICD-465.9) WELL ADULT EXAM (ICD-V70.0) FAMILY HISTORY DIABETES 1ST DEGREE RELATIVE (ICD-V18.0) HYPERTENSION (ICD-401.9)    Past Surgical History: Reviewed history from 09/19/2008 and no changes required. TAH/BSO secondary to ovarian cyst-2002 Recurrent pelvis cyst-2005 Rt foot (bunionectomy)   left shoulder surgery: spurs  2/09---Dr Woos Hysterectomy  Family History: Reviewed history from 09/13/2008 and no changes required. Family History Diabetes 1st degree relative Family History Hypertension Family History of CAD Female 1st degree relative <52 yo Family History of CAD Female 1st degree relative <35 Maunt--stroke Maunt--breast cancer Mniece--breast Cancer Family History of Stroke F 1st degree relative <60 Dementia Family History High cholesterol No FH of Colon Cancer:  Social History: Reviewed history from 04/30/2008 and no changes required. 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 Lungs:  Normal respiratory effort, chest expands symmetrically. Lungs are clear to auscultation, no crackles or wheezes. Heart:  normal rate and no murmur.   Abdomen:  soft,  no distention, no masses, no guarding, no rigidity, no rebound tenderness, and epigastric tenderness.   Extremities:  No clubbing, cyanosis, edema, or deformity noted with normal full range of motion of all joints.   Psych:  Oriented X3 and normally interactive.     Impression & Recommendations:  Problem # 1:  HEARTBURN (ICD-787.1) nexium qam avoid spicy acidic foods, peppermint and caffeine Orders: Gastroenterology Referral (GI)  Problem # 2:  CHEST PAIN UNSPECIFIED (ICD-786.50)  Orders: EKG w/ Interpretation (93000)  Complete Medication List: 1)  Hyzaar 100-25 Mg Tabs (Losartan potassium-hctz) .Marland Kitchen.. 1 by mouth once daily 2)  Klor-con M20 20 Meq Cr-tabs (Potassium chloride crys cr) .... Take two  tablets daily 3)  Estradiol 2 Mg Tabs (Estradiol) .... As needed 4)  Hydrocodone-acetaminophen 7.5-750 Mg Tabs (Hydrocodone-acetaminophen) .Marland Kitchen.. 1 by mouth every 6 hours as needed 5)  Levsin 0.125 Mg Tabs (Hyoscyamine sulfate) .... Dissolve 1 tablets under tongue as need for abdominal pain 6)  Nexium 40 Mg Cpdr (Esomeprazole magnesium) .Marland Kitchen.. 1 by mouth once daily Prescriptions: NEXIUM 40 MG CPDR (ESOMEPRAZOLE MAGNESIUM) 1 by mouth once daily  #30 x 11   Entered and Authorized by:   Loreen Freud DO   Signed by:   Loreen Freud DO on 09/24/2009   Method used:   Print then Give to Patient   RxID:   [id number]     EKG  Procedure date:  09/24/2009  Findings:      Normal sinus rhythm with rate of:  75

## 2010-05-19 NOTE — Letter (Signed)
Summary: New Patient letter  [facility]  [address] [place], [place] [address]   Phone: [phone]  Fax: [phone]       09/30/2009 MRN: [id number]  [name] [address] [place], [place]  [address]  Dear [name],  Welcome to the Gastroenterology Division at [facility].    You are scheduled to see Dr. Juanda Chance on 11/03/2009 at 8:30AM on the 3rd floor at [facility], [address].  We ask that you try to arrive at our office 15 minutes prior to your appointment time to allow for check-in.  We would like you to complete the enclosed self-administered evaluation form prior to your visit and bring it with you on the day of your appointment.  We will review it with you.  Also, please bring a complete list of all your medications or, if you prefer, bring the medication bottles and we will list them.  Please bring your insurance card so that we may make a copy of it.  If your insurance requires a referral to see a specialist, please bring your referral form from your primary care physician.  Co-payments are due at the time of your visit and may be paid by cash, check or credit card.     Your office visit will consist of a consult with your physician (includes a physical exam), any laboratory testing he/she may order, scheduling of any necessary diagnostic testing (e.g. x-ray, ultrasound, CT-scan), and scheduling of a procedure (e.g. Endoscopy, Colonoscopy) if required.  Please allow enough time on your schedule to allow for any/all of these possibilities.    If you cannot keep your appointment, please call [phone] to cancel or reschedule prior to your appointment date.  This allows [place] the opportunity to schedule an appointment for another patient in need of care.  If you do not cancel or reschedule by 5 p.m. the business day prior to your appointment date, you will be charged a $50.00 late cancellation/no-show fee.    Thank you for choosing [facility] for your medical needs.  We appreciate the opportunity to care for you.  Please visit [place] at our website  to learn more about our practice.                     Sincerely,                                                             The Gastroenterology Division

## 2010-05-19 NOTE — Assessment & Plan Note (Signed)
Summary: CPX/FASTING///KP   Vital Signs:  Patient profile:   50 year old female Menstrual status:  hysterectomy Height:      68 inches Weight:      274.0 pounds Temp:     98.5 degrees F oral BP sitting:   130 / 88  (right arm) Cuff size:   large  Vitals Entered By: Almeta Monas CMA Duncan Dull) (March 04, 2010 10:02 AM) CC: cpx/fasting--- c/o pain to the body at night only      Menstrual Status hysterectomy Last PAP Result normal   CC:  cpx/fasting--- c/o pain to the body at night only .  History of Present Illness: Pt here for cpe and labs .  Pt c/o muscle aches only at night in hands , arms and legs.  She takes aleve and it goes away.  Symptoms for about 1 month.   No known injury.   Pt sees Dr Ishmael Holter for gyn exam and mammo.    Preventive Screening-Counseling & Management  Alcohol-Tobacco     Alcohol drinks/day: 0     Smoking Status: never  Caffeine-Diet-Exercise     Caffeine use/day: 2     Does Patient Exercise: yes     Type of exercise: walking     Times/week: 5  Hep-HIV-STD-Contraception     HIV Risk: no     Dental Visit-last 6 months yes     Dental Care Counseling: not indicated; dental care within six months     SBE monthly: yes     SBE Education/Counseling: not indicated; SBE done regularly  Safety-Violence-Falls     Seat Belt Use: 100      Sexual History:  currently monogamous.    Current Medications (verified): 1)  Hyzaar 100-25 Mg Tabs (Losartan Potassium-Hctz) .Marland Kitchen.. 1 By Mouth Once Daily 2)  Klor-Con M20 20 Meq Cr-Tabs (Potassium Chloride Crys Cr) .... Take Two  Tablets Daily 3)  Estradiol 2 Mg Tabs (Estradiol) .... As Needed 4)  Hydrocodone-Acetaminophen 7.5-750 Mg Tabs (Hydrocodone-Acetaminophen) .Marland Kitchen.. 1 By Mouth Every 6 Hours As Needed 5)  Dexilant 60 Mg Cpdr (Dexlansoprazole) .... Take 1 Tablet By Mouth Two Times A Day. Pharmacy-Please D/c Nexium Rx...insurance Will Not Cover!  Allergies (verified): No Known Drug Allergies  Past  History:  Past Medical History: Last updated: 09/13/2008 Hypertension Post menopausal Left Shoulder pain  DIVERTICULOSIS, COLON (ICD-562.10) PELVIC PAIN, CHRONIC (ICD-789.09) HYPOKALEMIA, HX OF (ICD-V12.2) PREVENTIVE HEALTH CARE (ICD-V70.0) BENIGN NEOPLASM OF SKIN SITE UNSPECIFIED (ICD-216.9) FAMILY HISTORY OF CAD FEMALE 1ST DEGREE RELATIVE <50 (ICD-V17.3) FAMILY HISTORY OF CAD FEMALE 1ST DEGREE RELATIVE <60 (ICD-V16.49) PARESTHESIA, HANDS (ICD-782.0) KNEE SPRAIN, LEFT (ICD-844.9) NECK SPRAIN AND STRAIN (ICD-847.0) URI (ICD-465.9) WELL ADULT EXAM (ICD-V70.0) FAMILY HISTORY DIABETES 1ST DEGREE RELATIVE (ICD-V18.0) HYPERTENSION (ICD-401.9)    Past Surgical History: Last updated: 09/19/2008 TAH/BSO secondary to ovarian cyst-2002 Recurrent pelvis cyst-2005 Rt foot (bunionectomy)   left shoulder surgery: spurs  2/09---Dr Woos Hysterectomy  Family History: Last updated: 09/13/2008 Family History Diabetes 1st degree relative Family History Hypertension Family History of CAD Female 1st degree relative <52 yo Family History of CAD Female 1st degree relative <35 Maunt--stroke Maunt--breast cancer Mniece--breast Cancer Family History of Stroke F 1st degree relative <60 Dementia Family History High cholesterol No FH of Colon Cancer:  Social History: Last updated: 04/30/2008 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 Family History Diabetes 1st degree relative Family History Hypertension Family History of CAD Female 1st degree relative <52 yo Family History of CAD Female 1st degree relative <35 Maunt--stroke Maunt--breast cancer Mniece--breast Cancer Family History  of Stroke F 1st degree relative <60 Dementia Family History High cholesterol No FH of Colon Cancer:  Social History: Reviewed history from 04/30/2008 and no changes required. 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 Eyes:  Denies blurring, discharge, double vision, eye irritation, eye pain, halos, itching, light sensitivity, red eye, vision loss-1 eye, and vision loss-both eyes; optho--  q1y--. ENT:  Denies decreased hearing, difficulty swallowing, ear discharge, earache, hoarseness, nasal congestion, nosebleeds, postnasal drainage, ringing in ears, sinus pressure, and sore throat. CV:  Denies bluish discoloration of lips or nails, chest pain or discomfort, difficulty breathing at night, difficulty breathing while lying down, fainting, fatigue, leg cramps with exertion, lightheadness, near fainting, palpitations, shortness of breath with exertion, swelling of feet, swelling of hands, and weight gain. Resp:  Denies chest discomfort, chest pain with inspiration, cough, coughing up blood, excessive snoring, hypersomnolence, morning headaches, pleuritic, shortness of breath, sputum productive, and wheezing. GI:  Denies abdominal pain, bloody stools, change in bowel habits, constipation, dark tarry stools, diarrhea, excessive appetite, gas, hemorrhoids, indigestion, loss of appetite, nausea, vomiting, vomiting blood, and yellowish skin color. GU:  Denies abnormal vaginal bleeding, decreased libido, discharge, dysuria, genital sores, hematuria, incontinence, nocturia, urinary frequency, and urinary hesitancy. MS:  Complains of joint pain; denies joint redness, joint swelling, loss of  strength, low back pain, mid back pain, muscle aches, muscle , cramps, muscle weakness, stiffness, and thoracic pain. Derm:  Denies changes in color of skin, changes in nail beds, dryness, excessive perspiration, flushing, hair loss, insect bite(s), itching, lesion(s), poor wound healing, and rash. Neuro:  Denies brief paralysis, difficulty with concentration, disturbances in coordination, falling down, headaches, inability to speak, memory loss, numbness, poor balance, seizures, sensation of room spinning, tingling, tremors, visual disturbances, and weakness. Psych:  Denies alternate hallucination ( auditory/visual), anxiety, depression, easily angered, easily tearful, irritability, mental problems, panic attacks, sense of great danger, suicidal thoughts/plans, thoughts of violence, unusual visions or sounds, and thoughts /plans of harming others. Endo:  Denies cold intolerance, excessive hunger, excessive thirst, excessive urination, heat intolerance, polyuria, and weight change. Heme:  Denies abnormal bruising, bleeding, enlarge lymph nodes, fevers, pallor, and skin discoloration. Allergy:  Denies hives or rash, itching eyes, persistent infections, seasonal allergies, and sneezing.  Physical Exam  General:  Well-developed,well-nourished,in no acute distress; alert,appropriate and cooperative throughout examinationoverweight-appearing.   Head:  Normocephalic and atraumatic without obvious abnormalities. No apparent alopecia or balding. Eyes:  pupils equal, pupils round, pupils reactive to light, and no injection.   Ears:  External ear exam shows no significant lesions or deformities.  Otoscopic examination reveals clear canals, tympanic membranes are intact bilaterally without bulging, retraction, inflammation or discharge. Hearing is grossly normal bilaterally. Nose:  External nasal examination shows no deformity or inflammation. Nasal mucosa are pink and moist without lesions or exudates. Mouth:   Oral mucosa and oropharynx without lesions or exudates.  Teeth in good repair. Neck:  No deformities, masses,  or tenderness noted.no carotid bruits.   Breasts:  gyn Lungs:  Normal respiratory effort, chest expands symmetrically. Lungs are clear to auscultation, no crackles or wheezes. Heart:  normal rate and no murmur.   Abdomen:  Bowel sounds positive,abdomen soft and non-tender without masses, organomegaly or hernias noted. Rectal:  gyn Genitalia:  gyn Msk:  L hip pain with walking --no dec rom normal ROM, no joint swelling, no joint warmth, no redness over joints, no joint deformities, no joint instability, and no crepitation.   Pulses:  R posterior tibial normal, R dorsalis pedis normal, R carotid normal, L posterior tibial normal, L dorsalis pedis normal, and L carotid normal.   Extremities:  No clubbing, cyanosis, edema, or deformity noted with normal full range of motion of all joints.   Neurologic:  No cranial nerve deficits noted. Station and gait are normal. Plantar reflexes are down-going bilaterally. DTRs are symmetrical throughout. Sensory, motor and coordinative functions appear intact. Skin:  Intact without suspicious lesions or rashes Cervical Nodes:  No lymphadenopathy noted Psych:  Cognition and judgment appear intact. Alert and cooperative with normal attention span and concentration. No apparent delusions, illusions, hallucinations   Impression & Recommendations:  Problem # 1:  PREVENTIVE HEALTH CARE (ICD-V70.0) GHM utd  Orders: Venipuncture ([address]) TLB-Lipid Panel (80061-LIPID) TLB-BMP (Basic Metabolic Panel-BMET) (80048-METABOL) TLB-CBC Platelet - w/Differential (85025-CBCD) TLB-Hepatic/Liver Function Pnl (80076-HEPATIC) TLB-TSH (Thyroid Stimulating Hormone) (84443-TSH) TLB-Sedimentation Rate (ESR) (85652-ESR) T-Antinuclear Antib (ANA) (60454-09811) T-Rheumatoid Factor (91478-29562) EKG w/ Interpretation (93000)  Problem # 2:  PAIN IN JOINT, MULTIPLE SITES  (ICD-719.49)  Orders: Venipuncture ([address]) TLB-Lipid Panel (80061-LIPID) TLB-BMP (Basic Metabolic Panel-BMET) (80048-METABOL) TLB-CBC Platelet - w/Differential (85025-CBCD) TLB-Hepatic/Liver Function Pnl (80076-HEPATIC) TLB-TSH (Thyroid Stimulating Hormone) (84443-TSH) TLB-Sedimentation Rate (ESR) (85652-ESR) T-Antinuclear Antib (ANA) (57846-96295) T-Rheumatoid Factor (28413-24401) EKG w/ Interpretation (93000)  Problem # 3:  MYALGIA (ICD-729.1)  Her updated medication list for this problem includes:    Hydrocodone-acetaminophen 7.5-750 Mg Tabs (Hydrocodone-acetaminophen) .Marland Kitchen... 1 by mouth every 6 hours as needed  Orders: Venipuncture (02725) TLB-Lipid Panel (80061-LIPID) TLB-BMP (Basic Metabolic Panel-BMET) (80048-METABOL) TLB-CBC Platelet - w/Differential (85025-CBCD) TLB-Hepatic/Liver Function Pnl (80076-HEPATIC) TLB-TSH (Thyroid Stimulating Hormone) (84443-TSH) TLB-Sedimentation Rate (ESR) (85652-ESR) T-Antinuclear Antib (ANA) (36644-03474) T-Rheumatoid Factor (25956-38756) EKG w/ Interpretation (93000)  Problem # 4:  HYPOKALEMIA, HX OF (ICD-V12.2)  Problem # 5:  HYPERTENSION (ICD-401.9)  Her updated medication list for this problem includes:    Hyzaar 100-25 Mg Tabs (Losartan potassium-hctz) .Marland Kitchen... 1 by mouth once daily  Orders: Venipuncture ([address]) TLB-Lipid Panel (80061-LIPID) TLB-BMP (Basic Metabolic Panel-BMET) (80048-METABOL) TLB-CBC Platelet - w/Differential (85025-CBCD) TLB-Hepatic/Liver Function Pnl (80076-HEPATIC) TLB-TSH (Thyroid Stimulating Hormone) (84443-TSH) TLB-Sedimentation Rate (ESR) (85652-ESR) T-Antinuclear Antib (ANA) (51884-16606) T-Rheumatoid Factor (30160-10932) EKG w/ Interpretation (93000)  BP today: 130/88 Prior BP: 124/82 (11/03/2009)  Labs Reviewed: K+: 3.2 (04/23/2009) Creat: : 0.9 (04/23/2009)   Chol: 204 (04/30/2008)   HDL: 48.4 (04/30/2008)   LDL: DEL (04/30/2008)   TG: 79 (04/30/2008)  Complete Medication List: 1)  Hyzaar  100-25 Mg Tabs (Losartan potassium-hctz) .Marland Kitchen.. 1 by mouth once daily 2)  Klor-con M20 20 Meq Cr-tabs (Potassium chloride crys cr) .... Take two  tablets daily 3)  Estradiol 2 Mg Tabs (Estradiol) .... As needed 4)  Hydrocodone-acetaminophen 7.5-750 Mg Tabs (Hydrocodone-acetaminophen) .Marland Kitchen.. 1 by mouth every 6 hours as needed 5)  Dexilant 60 Mg Cpdr (Dexlansoprazole) .... Take 1 tablet by mouth two times a day. pharmacy-please d/c nexium rx...insurance will not cover!   Orders Added: 1)  Venipuncture [35573] 2)  TLB-Lipid Panel [  80061-LIPID] 3)  TLB-BMP (Basic Metabolic Panel-BMET) [80048-METABOL] 4)  TLB-CBC Platelet - w/Differential [85025-CBCD] 5)  TLB-Hepatic/Liver Function Pnl [80076-HEPATIC] 6)  TLB-TSH (Thyroid Stimulating Hormone) [84443-TSH] 7)  TLB-Sedimentation Rate (ESR) [85652-ESR] 8)  T-Antinuclear Antib (ANA) [47829-56213] 9)  T-Rheumatoid Factor [08657-84696] 10)  Est. Patient 40-64 years [99396] 11)  EKG w/ Interpretation [93000]    Flu Vaccine Next Due:  Refused Last PAP:  Normal (02/01/2007 9:41:44 AM) PAP Result Date:  07/02/2009 PAP Result:  normal PAP Next Due:  1 yr Last Mammogram:  Normal Bilateral (02/01/2007 9:41:44 AM) Mammogram Result Date:  06/25/2009 Mammogram Result:  normal Mammogram Next Due:  1 yr   Appended Document: CPX/FASTING///KP   Appended Document: CPX/FASTING///KP  Laboratory Results   Urine Tests   Date/Time Reported: March 04, 2010 1:09 PM   Routine Urinalysis   Color: yellow Appearance: Clear Glucose: negative   (Normal Range: Negative) Bilirubin: negative   (Normal Range: Negative) Ketone: negative   (Normal Range: Negative) Spec. Gravity: >=1.030   (Normal Range: 1.003-1.035) Blood: small   (Normal Range: Negative) pH: 5.0   (Normal Range: 5.0-8.0) Protein: negative   (Normal Range: Negative) Urobilinogen: negative   (Normal Range: 0-1) Nitrite: negative   (Normal Range: Negative) Leukocyte Esterace: negative    (Normal Range: Negative)    Comments: Nia Nathaniel  March 04, 2010 1:10 PM cx

## 2010-05-19 NOTE — Medication Information (Signed)
Summary: Noncompliance with Losartan/[employer]  Noncompliance with Losartan/[employer]   Imported By: Lanelle Bal 01/05/2010 08:38:04  _____________________________________________________________________  External Attachment:    Type:   Image     Comment:   External Document

## 2010-05-21 NOTE — Progress Notes (Signed)
Summary: vaginal itching  Phone Note Call from Patient Call back at [phone]   Caller: Patient Summary of Call: Pt c/o vaginal itching since starting amoxicillin. Pt would like a cream or something to help with the itching....Marland KitchenMarland KitchenFelecia Deloach CMA  April 27, 2010 2:25 PM    Follow-up for Phone Call        diflucan 150 mg  #2  1 by mouth once daily x1 , may repeat in 3 days as needed  Follow-up by: Loreen Freud DO,  April 27, 2010 5:20 PM  Additional Follow-up for Phone Call Additional follow up Details #1::        Discuss with patient .....Marland KitchenMarland KitchenFelecia Deloach CMA  April 27, 2010 5:22 PM     New/Updated Medications: DIFLUCAN 150 MG TABS (FLUCONAZOLE) Take  1 by mouth once daily x1 , may repeat in 3 days as needed Prescriptions: DIFLUCAN 150 MG TABS (FLUCONAZOLE) Take  1 by mouth once daily x1 , may repeat in 3 days as needed  #2 x 0   Entered by:   Jeremy Johann CMA   Authorized by:   Loreen Freud DO   Signed by:   Jeremy Johann CMA on 04/27/2010   Method used:   [facility]ed to ...       [facility]  [address] [id number]       [address]       [place], [place]  [address]       Ph: [phone] or [phone]       Fax: [phone]   RxID:   [id number]

## 2010-05-21 NOTE — Letter (Signed)
Summary: Out of Work  [facility]  [address] [place], [place] [address]   Phone: [phone]  Fax: [phone]    April 23, 2010   Employee:  [name]    To Whom It May Concern:   For Medical reasons, please excuse the above named employee from work for the following dates:  Start:   04/23/2010  End:   04/23/2010  If you need additional information, please feel free to contact our office.         Sincerely,    Loreen Freud DO

## 2010-05-21 NOTE — Assessment & Plan Note (Signed)
Summary: flu//kp   Vital Signs:  Patient profile:   50 year old female Menstrual status:  hysterectomy Weight:      274 pounds Temp:     98.7 degrees F oral Pulse rate:   84 / minute Pulse rhythm:   regular BP sitting:   140 / 90  (right arm) Cuff size:   large  Vitals Entered By: Almeta Monas CMA Duncan Dull) (April 23, 2010 10:25 AM) CC: X2days c/o bodyaches, fever, weakness, cough, URI symptoms    History of Present Illness:       This is a 50 year old woman who presents with URI symptoms.  The symptoms began 3 days ago.  The patient complains of nasal congestion, clear nasal discharge, sore throat, dry cough, and sick contacts, but denies earache.  Associated symptoms include fever.  The patient denies low-grade fever (<100.5 degrees), fever of 100.5-103 degrees, fever of 103.1-104 degrees, fever to >104 degrees, stiff neck, dyspnea, wheezing, rash, vomiting, diarrhea, use of an antipyretic, and response to antipyretic.  The patient also reports headache and muscle aches.  The patient denies itchy watery eyes, itchy throat, sneezing, seasonal symptoms, response to antihistamine, and severe fatigue.  The patient denies the following risk factors for Strep sinusitis: unilateral facial pain, unilateral nasal discharge, poor response to decongestant, double sickening, tooth pain, Strep exposure, tender adenopathy, and absence of cough.    Current Medications (verified): 1)  Hyzaar 100-25 Mg Tabs (Losartan Potassium-Hctz) .Marland Kitchen.. 1 By Mouth Once Daily 2)  Klor-Con M20 20 Meq Cr-Tabs (Potassium Chloride Crys Cr) .... Take Two  Tablets Daily 3)  Estradiol 2 Mg Tabs (Estradiol) .... As Needed 4)  Hydrocodone-Acetaminophen 7.5-750 Mg Tabs (Hydrocodone-Acetaminophen) .Marland Kitchen.. 1 By Mouth Every 6 Hours As Needed 5)  Dexilant 60 Mg Cpdr (Dexlansoprazole) .... Take 1 Tablet By Mouth Two Times A Day. Pharmacy-Please D/c Nexium Rx...insurance Will Not Cover! 6)  Plaquenil 200 Mg Tabs (Hydroxychloroquine  Sulfate) .Marland Kitchen.. 1 By Mouth Two Times A Day 7)  Amoxicillin 875 Mg Tabs (Amoxicillin) .Marland Kitchen.. 1 By Mouth Two Times A Day 8)  Claritin 10 Mg Tabs (Loratadine) .Marland Kitchen.. 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  Past Medical History: Reviewed history from 09/13/2008 and no changes required. Hypertension Post menopausal Left Shoulder pain  DIVERTICULOSIS, COLON (ICD-562.10) PELVIC PAIN, CHRONIC (ICD-789.09) HYPOKALEMIA, HX OF (ICD-V12.2) PREVENTIVE HEALTH CARE (ICD-V70.0) BENIGN NEOPLASM OF SKIN SITE UNSPECIFIED (ICD-216.9) FAMILY HISTORY OF CAD FEMALE 1ST DEGREE RELATIVE <50 (ICD-V17.3) FAMILY HISTORY OF CAD FEMALE 1ST DEGREE RELATIVE <60 (ICD-V16.49) PARESTHESIA, HANDS (ICD-782.0) KNEE SPRAIN, LEFT (ICD-844.9) NECK SPRAIN AND STRAIN (ICD-847.0) URI (ICD-465.9) WELL ADULT EXAM (ICD-V70.0) FAMILY HISTORY DIABETES 1ST DEGREE RELATIVE (ICD-V18.0) HYPERTENSION (ICD-401.9)    Past Surgical History: Reviewed history from 09/19/2008 and  no changes required. TAH/BSO secondary to ovarian  cyst-2002 Recurrent pelvis cyst-2005 Rt foot (bunionectomy)   left shoulder surgery: spurs  2/09---Dr Woos Hysterectomy  Family History: Reviewed history from 09/13/2008 and no changes required. Family History Diabetes 1st degree relative Family History Hypertension Family History of CAD Female 1st degree relative <52 yo Family History of CAD Female 1st degree relative <35 Maunt--stroke Maunt--breast cancer Mniece--breast Cancer Family History of Stroke F 1st degree relative <60 Dementia Family History High cholesterol No FH of Colon Cancer:  Social History: Reviewed history from 04/30/2008 and no changes required. 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  Otoscopic examination reveals clear canals, tympanic membranes are intact bilaterally without bulging, retraction, inflammation or discharge. Hearing is grossly normal bilaterally. Nose:  no external deformity, mucosal erythema, and mucosal edema.   Mouth:  pharyngeal erythema and postnasal drip.   Neck:  No deformities, masses, or tenderness noted. Lungs:  Normal respiratory effort, chest expands symmetrically. Lungs are clear to auscultation, no crackles or wheezes. Heart:  normal rate and no murmur.     Impression & Recommendations:  Problem # 1:  URI (ICD-465.9)  Instructed on symptomatic treatment. Call if symptoms persist or worsen.   Her updated medication list for this problem includes:    Claritin 10 Mg Tabs (Loratadine) .Marland Kitchen... 1 by mouth once daily    If no better fill amoxicillin ----rto as needed   Complete Medication List: 1)  Hyzaar 100-25 Mg Tabs (Losartan potassium-hctz) .Marland Kitchen.. 1 by  mouth once daily 2)  Klor-con M20 20 Meq Cr-tabs (Potassium chloride crys cr) .... Take two  tablets daily 3)  Estradiol 2 Mg Tabs (Estradiol) .... As needed 4)  Hydrocodone-acetaminophen 7.5-750 Mg Tabs (Hydrocodone-acetaminophen) .Marland Kitchen.. 1 by mouth every 6 hours as needed 5)  Dexilant 60 Mg Cpdr (Dexlansoprazole) .... Take 1 tablet by mouth two times a day. pharmacy-please d/c nexium rx...insurance will not cover! 6)  Plaquenil 200 Mg Tabs (Hydroxychloroquine sulfate) .Marland Kitchen.. 1 by mouth two times a day 7)  Amoxicillin 875 Mg Tabs (Amoxicillin) .Marland Kitchen.. 1 by mouth two times a day 8)  Claritin 10 Mg Tabs (Loratadine) .Marland Kitchen.. 1 by mouth once daily Prescriptions: AMOXICILLIN 875 MG TABS (AMOXICILLIN) 1 by mouth two times a day  #20 x 0   Entered and Authorized by:   Loreen Freud DO   Signed by:   Loreen Freud DO on 04/23/2010   Method used:   [facility]        [facility]  [address] [id number]       [address]       [place], [place]  [address]       Ph: [phone] or [phone]       Fax: [phone]   RxID:   [id number]    Orders Added: 1)  Est. Patient Level III [95638]    Laboratory Results    Other Tests  Rapid Strep: negative Influenza A: negative Influenza B: negative

## 2010-05-21 NOTE — Letter (Signed)
Summary: [facility]   Imported By: Lanelle Bal 04/16/2010 12:02:11  _____________________________________________________________________  External Attachment:    Type:   Image     Comment:   External Document

## 2010-05-21 NOTE — Consult Note (Signed)
Summary: [facility]   Imported By: Lanelle Bal 03/31/2010 11:31:37  _____________________________________________________________________  External Attachment:    Type:   Image     Comment:   External Document

## 2010-07-09 ENCOUNTER — Ambulatory Visit: Payer: Self-pay | Admitting: Family Medicine

## 2010-07-09 DIAGNOSIS — Z0289 Encounter for other administrative examinations: Secondary | ICD-10-CM

## 2010-07-14 ENCOUNTER — Ambulatory Visit (INDEPENDENT_AMBULATORY_CARE_PROVIDER_SITE_OTHER): Payer: Managed Care, Other (non HMO) | Admitting: Family Medicine

## 2010-07-14 ENCOUNTER — Encounter: Payer: Self-pay | Admitting: Family Medicine

## 2010-07-14 VITALS — BP 126/74 | HR 88 | Wt 286.4 lb

## 2010-07-14 DIAGNOSIS — N390 Urinary tract infection, site not specified: Secondary | ICD-10-CM

## 2010-07-14 DIAGNOSIS — R319 Hematuria, unspecified: Secondary | ICD-10-CM

## 2010-07-14 DIAGNOSIS — I10 Essential (primary) hypertension: Secondary | ICD-10-CM

## 2010-07-14 LAB — POCT URINALYSIS DIPSTICK
Glucose, UA: NEGATIVE
Nitrite, UA: NEGATIVE
Protein, UA: NEGATIVE
Urobilinogen, UA: NEGATIVE

## 2010-07-14 MED ORDER — POTASSIUM CHLORIDE CRYS ER 20 MEQ PO TBCR: 20.0000 meq | EXTENDED_RELEASE_TABLET | Freq: Every day | ORAL | Status: DC

## 2010-07-14 NOTE — Progress Notes (Signed)
Addended by: Floydene Flock on: 07/14/2010 04:38 PM   Modules accepted: Orders

## 2010-07-14 NOTE — Progress Notes (Signed)
  Subjective:    Patient ID: [name], female    DOB: Aug 19, 1960, 50 y.o.   MRN: [id number]  HPI    Review of Systems     Objective:   Physical Exam        Assessment & Plan:   Subjective:    Patient here for follow-up of elevated blood pressure.  She is exercising , walking qd for 10 min and is adherent to a low-salt diet.  Blood pressure is well controlled at home. Cardiac symptoms: none. Patient denies: chest pain, chest pressure/discomfort, dyspnea, exertional chest pressure/discomfort, fatigue, irregular heart beat, lower extremity edema, near-syncope, orthopnea, palpitations, paroxysmal nocturnal dyspnea, syncope and tachypnea. Cardiovascular risk factors: hypertension, obesity (BMI >= 30 kg/m2) and sedentary lifestyle. Use of agents associated with hypertension: none. History of target organ damage: none.  The following portions of the patient's history were reviewed and updated as appropriate: allergies, current medications, past family history, past medical history, past social history, past surgical history and problem list.  Review of Systems Pertinent items are noted in HPI.     Objective:    BP 126/74  Pulse 88  Wt 286 lb 6.4 oz (129.91 kg) General appearance: alert, cooperative, no distress and mildly obese Lungs: clear to auscultation bilaterally Heart: regular rate and rhythm, S1, S2 normal, no murmur, click, rub or gallop Extremities: extremities normal, atraumatic, no cyanosis or edema    Assessment:    Hypertension, normal blood pressure today. Evidence of target organ damage: none.    Plan:    Medication: no change. Screening for causes of secondary hypertension: GFR (chronic kidney disease). Dietary sodium restriction. Regular aerobic exercise. Check blood pressures 2 times weekly and record. Follow up: 6 months and as needed.

## 2010-07-14 NOTE — Progress Notes (Signed)
Addended by: Floydene Flock on: 07/14/2010 04:23 PM   Modules accepted: Orders

## 2010-07-14 NOTE — Patient Instructions (Signed)
  Place appropriate patient instructions regarding hypertension here. 

## 2010-07-15 LAB — LIPID PANEL
Cholesterol: 187 mg/dL (ref 0–200)
LDL Cholesterol: 104 mg/dL — ABNORMAL HIGH (ref 0–99)
Triglycerides: 172 mg/dL — ABNORMAL HIGH (ref 0.0–149.0)
VLDL: 34.4 mg/dL (ref 0.0–40.0)

## 2010-07-15 LAB — HEPATIC FUNCTION PANEL
ALT: 15 U/L (ref 0–35)
Albumin: 3.7 g/dL (ref 3.5–5.2)
Total Protein: 7.2 g/dL (ref 6.0–8.3)

## 2010-07-15 LAB — BASIC METABOLIC PANEL
Chloride: 105 mEq/L (ref 96–112)
GFR: 72.28 mL/min (ref >60.00)
Potassium: 4 mEq/L (ref 3.5–5.1)
Sodium: 140 mEq/L (ref 135–145)

## 2010-07-16 LAB — URINE CULTURE: Colony Count: 8000

## 2010-07-17 ENCOUNTER — Telehealth: Payer: Self-pay | Admitting: *Deleted

## 2010-07-17 NOTE — Telephone Encounter (Signed)
Left message on VM      KP

## 2010-07-17 NOTE — Progress Notes (Signed)
Left message to call back     KP 

## 2010-07-17 NOTE — Progress Notes (Signed)
Pt aware--done in the office      KP

## 2010-07-17 NOTE — Telephone Encounter (Signed)
Pt return call Left message to call office.  

## 2010-07-17 NOTE — Progress Notes (Signed)
Pt aware of results---done in office    KP

## 2010-07-17 NOTE — Telephone Encounter (Signed)
Message copied by Candie Echevaria on Fri Jul 17, 2010  3:42 PM ------      Message from: Loreen Freud      Created: Fri Jul 17, 2010  2:24 PM       UA contaminated---recheck if symptomatic      Cholesterol--- LDL goal < 100,  HDL >40,  TG < 150.  Diet and exercise will increase HDL and decrease LDL and TG.  Fish,  Fish Oil, Flaxseed oil will also help increase the HDL and decrease Triglycerides.   Recheck labs in 3-6 months.      TG have increased since last check.

## 2010-07-20 NOTE — Telephone Encounter (Signed)
Spoke with patient and she is aware of labs--copy mailed       KP

## 2010-07-20 NOTE — Telephone Encounter (Signed)
Left message to call office

## 2010-09-04 NOTE — Assessment & Plan Note (Signed)
[facility]                        [facility] OFFICE NOTE   NAME:[name]                         MRN:          [id number]  DATE:06/22/2006                            DOB:          January 15, 1961    REASON FOR VISIT:  Headache.   [name] is a 50 year old female who reports that for the last 2 weeks  she has been having a headache that starts in the left side of her neck  into the left temporal area. Sometimes it goes into the axillary sinus  and behind her left eye. She reports that it has been off and on, on a  daily basis for the whole 2 weeks. She does report that this is not an  unusual type of headache. Typically it presents when she has sinus  infection. The only difference is that it has been prolonged. She  denies any photosensitivity, she denies any nausea or vomiting,  paresthesias, syncope, or presyncopal episodes. She denies any head  trauma or recent stressors although her sister passed away about a week  ago. She rates the headache at 4/10, occasionally it is up to a 6. She  has been taking some Advil on an as needed basis. The Advil has  decreased the discomfort but has not resolved the pain.   MEDICATIONS:  1. Benicar 40.  2. Premarin 0.45 mg.   ALLERGIES:  No known drug allergies.   REVIEW OF SYSTEMS:  As per HPI otherwise unremarkable.   OBJECTIVE:  VITAL SIGNS:  Weight 275.8, pulse 80, blood pressure 130/94.  GENERAL:  We have a pleasant, overweight female in no acute distress,  answers questions appropriately.  HEENT:  Normocephalic, atraumatic. Pupils were equal and reactive to  light. Extraocular muscles were intact. No nystagmus was noted. Fundi  was benign.  NECK:  The nasal mucosa was swollen and boggy. No significant drainage  noted. Oropharynx is benign. Supple, no lymphadenopathy, carotid bruits  or JVD. No thyromegaly.  LUNGS:  Clear.  HEART:  Regular rate and rhythm. Normal S1, S2. No murmur, gallop  or  rub.  NEUROLOGIC:  Cranial nerves II-XII grossly intact. No focal sensory or  motor deficits are noted. Cerebellar function was within normal limits.  No pronator drift was appreciated. Gait was uninhibited.   IMPRESSION:  A 50 year old female presenting with a 2 week headache.  Differential diagnosis includes tension headache versus sinusitis based  on history.   PLAN:  1. Precautions were reviewed with the patient in regards to worsening      headaches. Advised if symptoms worsen in anyway she is to seek      urgent attention or to call our office.  2. Will treat the patient empirically as a sinusitis with Augmentin      875 mg b.i.d. for 10 days, side effects were reviewed.  3. Will use Tylenol #3 p.r.n. as needed for moderate headache. Side      effects were reviewed.  4. The patient is scheduled to followup with me in 10 days or sooner      if symptoms worsen in  anyway.     Leanne Chang, M.D.  Electronically Signed    LA/MedQ  DD: 06/22/2006  DT: 06/22/2006  Job #: [id number]

## 2010-09-04 NOTE — Op Note (Signed)
[name], [name]                  ACCOUNT NO.:  [id number]   MEDICAL RECORD NO.:  [id number]          PATIENT TYPE:  INP   LOCATION:  9316                          FACILITY:  [facility]   PHYSICIAN:  Freddy Finner, M.D.   DATE OF BIRTH:  Jun 06, 1960   DATE OF PROCEDURE:  03/12/2004  DATE OF DISCHARGE:                                 OPERATIVE REPORT   PREOPERATIVE DIAGNOSIS:  Large complex cystic pelvic mass with left ureteral  obstruction.   POSTOPERATIVE DIAGNOSES:  1.  Large complex cystic pelvic mass with left ureteral obstruction with      left ovarian remnant syndrome and large benign cyst by frozen section,      probably of ovarian origin.  2.  Extensive intra-abdominal and pelvic adhesions.  3.  Complete obstruction of the left ureter.   PROCEDURE:  Cystoscopy by Dr. Ginger Carne with easy cannulization and  placement of the stent in the right ureter but inability to pass a catheter  through the left ureter, followed by exploratory laparotomy, resection of  large cystic and pelvic mass, and ovarian remnant, lysis of adhesions,  repair of serosal injury of colon on two occasions-one on the sigmoid colon  and one on the small bowel loop of ileum.   SURGEON:  Freddy Finner, M.D.   ASSISTANT:  1.  Ginger Carne, M.D.  2.  Courtney Paris, M.D., was brought in following transection of the      left ureter who performed a reimplantation of the left ureter.   ANESTHESIA:  General endotracheal anesthesia.   COMPLICATIONS:  Ureteral transection.  No other complications.   ESTIMATED BLOOD LOSS:  500 cc.   INDICATIONS:  Details of the present illness are recorded in the admission  note.  The patient was admitted following a CT scan in the radiographic unit  at [facility] showing obstruction of the left ureter.   DESCRIPTION OF PROCEDURE:  The patient was brought to the operating room.  There, placed under adequate general endotracheal anesthesia.   Placed in the  dorsal lithotomy position using the [facility] stirrup system.  The perineum and  vagina were prepped by Dr. Ginger Carne, who will dictate an operative  note regarding this cystoscopy and attempted placement of ureteral stent.   After completion of this procedure, a Foley catheter was placed.  The  abdomen was prepped and draped for laparotomy.  A vertical midline incision  was made from the symphysis to just below the umbilicus.  The incision was  carried sharply down to fascia which was entered sharply and extended to the  skin incision.  Bleeding subcutaneous vessels were controlled with Bovie.   Rectus midline was identified and entered with gentle sharp and blunt  dissection.  The rectus muscle was divided.  The perineum was carefully  elevated with hemostats and entered sharply.  The incision was extended.  Peritoneal washings were taken.  Loop of bowel was adherent to the right  side of the incision on the peritoneal surface and this was freed.  This  serosa of the bowel was partially denuded in an area approximately 5 mm.  Interrupted 3-0 silk popoff sutures were used to close the defect in the  muscularis and serosa.   The mass was palpable lateral to and an inferior to the sigmoid colon which  was elevated over the mass.  The mesentery was entered inferior to the  sigmoid to open the space containing the mass.  With careful blunt and sharp  dissection, the mass was dissected from approximately three-fourths of the  parameter of the mass.  This was ruptured at this point and it was clear,  yellow fluid. The wall was thin and the segment of the wall was excised and  sent to pathology for evaluation.  Preliminary report was a benign cystic  structure.   The dissection was then continued with sharp and blunt dissection.  Essentially 90% or more of the mass was felt to have been resected including  a segment of approximately 1.5 cm of what appeared to be ovary.   Additional  attempts were then made to free the obstruction.  The ureter was easily  identified and dilated and the dissection was carried along the anterior  surface of the ureter.  In the process of this dissection the point of  occlusion, near the ureter, was transected.  Remaining portions of the cyst  and ovary were excised.   After transection of the ureter, Dr. Dennison Nancy. Kimbrough was consulted and  came in for the reimplantation procedure which will be described in his  operative note.   After completion of this procedure, copious irrigation was carried out.  Please note that the small defect in the serosa of the lower portion of the  sigmoid did occur during the dissection.  This was also closed with  interrupted 3-0 silk popoff's.  No free spillage of material was encountered  from either bowel injury of this one or the one described above.  After  completing the procedure, copious irrigation was carried out.  Hemostasis  was adequate.  All pack, needles, and instruments were removed.  Counts were  correct.  Rectus muscles were approximated with figure-of-eight of 0  Monocryl.  Fascia was closed with running 0 PDS running from superiorly to  midpoint and from inferiorly to midpoint with two separate sutures.  Skin  was closed with wide skin staples.   The patient was awakened and taken to the recovery room in good condition.     [name]   WRN/MEDQ  D:  03/12/2004  T:  03/12/2004  Job:  [id number]

## 2010-09-04 NOTE — Op Note (Signed)
NAMEBRYNNE, [name]                            ACCOUNT NO.:  [id number]   MEDICAL RECORD NO.:  [id number]                   PATIENT TYPE:  INP   LOCATION:  9322                                 FACILITY:  [facility]   PHYSICIAN:  Freddy Finner, M.D.                DATE OF BIRTH:  [birth year]   DATE OF PROCEDURE:  01/03/2003  DATE OF DISCHARGE:                                 OPERATIVE REPORT   PREOPERATIVE DIAGNOSES:  1. Complex adnexal mass.  2. Uterine leiomyomata.  3. 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   POSTOPERATIVE DIAGNOSES:  1. Bilateral complex adnexal masses, including ovarian cysts.  2. Uterine leiomyomata.  3. 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  4. Basically a frozen pelvis with extensive pelvic and adnexal adhesions,     uterine fibroids.   OPERATIVE PROCEDURE:  Total abdominal hysterectomy, bilateral salpingo-  oophorectomy.   SURGEON:  Freddy Finner, M.D.   ASSISTANT:  Dineen Kid. Rana Snare, M.D.   ESTIMATED INTRAOPERATIVE BLOOD LOSS:  800 mL.   ANESTHESIA:  General endotracheal.   INTRAOPERATIVE COMPLICATIONS:  None.   Details of the present illness recorded in the admission note.  The patient  has had a long history of GYN problems and had exploratory laparotomy in  1999 for bilateral ovarian endometriomas, which were resected at that time.  She has reached a point in her life where she is not willing to continue  with pain to maintain her ability to conceive and has requested definitive  surgery.  She is admitted at this time for that purpose.   She was admitted on the morning of surgery.  She was placed on PAS hose.  She was given a gram of Cefotetan IV preoperatively.  She was brought to the  operating room, placed under adequate general endotracheal anesthesia,  placed in the dorsal recumbent position.  Abdomen,  perineum, and vagina were  prepped in the usual fashion with scrub followed by solution.  A Foley  catheter was placed using sterile technique.  Sterile drapes were applied.  A lower abdominal transverse incision was made just above an old scar.  This  incision was carried sharply to the fascia, which was entered sharply and  extended to the extent of the skin incision.  The rectus sheath was  developed superiorly and inferiorly with blunt and sharp dissection.  Subcutaneous and subfascial bleeding vessels were controlled with the Bovie.  The peritoneum was entered and, in fact, the omentum was stuck directly to  the peritoneum and so the entry into the abdomen was carried out through the  omentum, which was left in this position.  Exploration of the upper abdomen  revealed no palpable abnormalities of liver, gallbladder, or kidneys.  The  appendix was visualized  later in the case and was normal.  A self-retaining  O'Connor-O'Sullivan retractor was then used.  Moist towels were used to pack  intestine out of the pelvis as best as could be accomplished, although there  were loops of bowel adherent to the posterior fundus and adhesions filling  the cul-de-sac and both adnexa.  Careful sharp and blunt dissection was then  carried out to partially free up the adnexa on either side.  Using the  LigaSure system, the round ligament on each side was sealed and divided.  The upper broad ligament was sealed and divided.  Portions of the adnexa on  each side were removed.  It was felt for certain that the ovary remained on  the right side.  With continued careful dissection, the uterus and cul-de-  sac were freed.  The bladder flap was developed and the bladder advanced off  the cervix, and we continued progressive bites with LigaSure system.  Each  pedicle was sealed and divided as we progressed.  This was carried down to  the level of the uterosacral ligaments on each side.  These ligaments were   then clamped with Heaneys and divided sharply and suture ligated in a Heaney  fashion.  The cervix was completely excised from the cuff.  The cuff was  closed with figure-of-eights of 0 Monocryl.  Attention was then directed at  additional dissection on either side, first starting with the right, and  with very careful blunt and sharp dissection the ovary and most if not all  of the ovarian cortex were dissected off the right pelvic sidewall and  removed.  Bleeding sources within this dissection were controlled with the  Bovie.  There was a fragment of tissue measuring approximately 2.5 cm,  probably containing elements of tube and ovary on the left, which was  developed.  In each case on each side, the infundibulopelvic ligament was  sealed with the LigaSure system and divided.  Again, the Bovie was required  for complete hemostasis on the left side.  Irrigation was carried out.  Hemostasis was considered to be adequate.  At this point the procedure was  terminated, all instruments, needles, and packs were accounted for.  The  abdominal incision was closed in layers.  Figure-of-eights of 0 Monocryl  were used to approximate the rectus muscles and thereby closed the  peritoneal cavity.  The fascia was closed with a running 0 PDS from angle to  angle on either side.  Subcutaneous tissue was approximated with a running 2-  0 plain suture.  The skin was closed with wide skin staples.  The patient  tolerated the operative field well.  She was awakened and taken to the  recovery room in satisfactory condition.                                                Freddy Finner, M.D.    WRN/MEDQ  D:  01/03/2003  T:  01/03/2003  Job:  [id number]

## 2010-09-04 NOTE — Assessment & Plan Note (Signed)
[facility]                        [facility] OFFICE NOTE   NAME:[name], [name]                         MRN:          [id number]  DATE:07/01/2006                            DOB:          01/04/1961    REASON FOR VISIT:  Follow up headache.   HISTORY OF PRESENT ILLNESS:  [name] is a 50 year old female who is  presenting for a follow up on headache which was treated as sinusitis.  She was given a prescription for Tylenol 3, as well as Augmentin 875 mg  b.i.d. Unfortunately, the patient did not pick up the prescription for  the Augmentin, because it totally crossed her mind until I reviewed our  previous discussion. The patient does report that the headache is  usually worse in the morning or in the evening when she goes to bed. She  describes it as a 6-7/10. Symptoms have not worsened. Please see the  previous note for further details.   MEDICATIONS:  1. Premarin 0.25 mg daily.  2. Benicar 40 mg daily.   ALLERGIES:  No known drug allergies.   REVIEW OF SYSTEMS:  As per history of present illness, otherwise  unremarkable.   OBJECTIVE:  VITAL SIGNS:  Blood pressure 130/80, weight 277, pulse 82.  GENERAL:  This is a pleasant overweight female in no acute distress.  HEENT:  Pupils were equal and reactive to light, extraocular muscles  intact, no nystagmus appreciated; tympanic membranes were both clear  bilaterally; nasal mucosa was not swollen with very minimal nasal  discharge; oral pharynx was benign.  NECK:  Supple.  LUNGS:  Clear.  NEUROLOGIC:  Non-focal.   IMPRESSION:  50 year old female with a now three week history of a  headache. Unfortunately, treatment for sinusitis was not implement  secondary to the patient's forgetting to pick up the prescription.  Nonetheless, given that her headache continues to be present and starts  in the base of her neck into the left side of her head, further  evaluation will be warranted at this  point.   PLAN:  1. The patient states that she will go ahead and pick up the      prescription for Augmentin 875 mg b.i.d. for 10 days.  2. We will obtain a MRI with or without contrast to rule out any      positive pathology given the      extent of her headache.  3. The patient is advised that if her symptoms worsen in anyway she is      to seek urgent medical care. The patient expressed understanding.     Leanne Chang, M.D.  Electronically Signed    LA/MedQ  DD: 07/03/2006  DT: 07/04/2006  Job #: [id number]

## 2010-09-04 NOTE — Op Note (Signed)
[name], [name]                  ACCOUNT NO.:  [id number]   MEDICAL RECORD NO.:  [id number]          PATIENT TYPE:  INP   LOCATION:  9316                          FACILITY:  [facility]   PHYSICIAN:  Rozanna Boer., M.D.DATE OF BIRTH:  04/06/1961   DATE OF PROCEDURE:  03/11/2004  DATE OF DISCHARGE:                                 OPERATIVE REPORT   PREOPERATIVE DIAGNOSIS:  Injured left ureter.   POSTOPERATIVE DIAGNOSIS:  Injured left ureter.   OPERATION PERFORMED:  Left ureteral reimplantation.   SURGEON:  Courtney Paris, M.D.   ASSISTANT:  Freddy Finner, M.D.   ANESTHESIA:  General.   INDICATIONS FOR PROCEDURE:  This 50 year old black female presented with a  pelvic mass and some pain and was found to have a large benign ovarian  lesion and rather severe endometriosis.  In resecting this lesion, Dr. Jennette Kettle  inadvertently cut the ureter distally near the bladder and I was called to  the operating room to repair this.  The ureter was transected probably about  3 or 4 cm from the ureterovesical junction on the left side.  It did not  look like it could be repaired primarily.  The bladder was then opened and  there seemed to be plenty of bladder to stretch up to the ureter which was  freed up so that it came into the bladder without tension.  A right angle  clamp was brought out through a dependent portion of the back wall of the  bladder and a silk suture which had been placed on the 6 o'clock position of  the ureter was then pulled into the bladder.  Potts Smith scissors were used  to spatulate the ureteral opening and a 6 French x 24 cm length double-J  ureteral stent was then passed up the ureteral orifice up into the kidney.  As the guidewire was removed, the stent was in good position.  The ureteral  mucosa was then tacked to the bladder with five 4-0 chromic catgut sutures  at 12, 2, 5, 6, 8, and 10 o'clock to effect good anchoring of the ureter to  the bladder.   Two more stitches were placed outside the bladder with 4-0  chromic to anchor the back wall of the ureter to the bladder wall so that  again it would relieve tension.  There was a nice gentle curve of the ureter  to the bladder and it did not appear to be under any tension.  The bladder  was then closed with a running 3-0 chromic for the mucosa and another 0  chromic for the muscle layer to effect a water tight closure.  The rest of  the procedure will be dictated by Dr. Jennette Kettle.  We will leave the catheter for  two weeks and I will removed the stent in about a month postoperatively.      HMK/MEDQ  D:  03/12/2004  T:  03/12/2004  Job:  [id number]

## 2010-09-04 NOTE — Op Note (Signed)
[name], [name]                  ACCOUNT NO.:  [id number]   MEDICAL RECORD NO.:  [id number]          PATIENT TYPE:  INP   LOCATION:  9316                          FACILITY:  [facility]   PHYSICIAN:  Ginger Carne, MD  DATE OF BIRTH:  11-19-1960   DATE OF PROCEDURE:  03/11/2004  DATE OF DISCHARGE:                                 OPERATIVE REPORT   PREOPERATIVE DIAGNOSIS:  Obstructed left ureter and 10 cm left pelvic mass.   POSTOPERATIVE DIAGNOSIS:  Obstructed left ureter and 10 cm left pelvic mass.  Distal ureterovesical obstruction (left side).   OPERATION PERFORMED:  Open stenting of right ureter and failed attempt to  catheterize and stent left ureter.   SURGEON:  Ginger Carne, MD   ASSISTANT:  None.   COMPLICATIONS:  None immediately.   ESTIMATED BLOOD LOSS:  Negligible.   SPECIMENS:  None.   ANESTHESIA:  General.   OPERATIVE FINDINGS:  External genitalia, vulva and vagina normal.  The  patient had absent cervix and uterus.  The external urethral meatus appeared  normal.  Urethral canal normal.  No evidence of bladder lesions noted from  trigone to fundus.  The right ureter was easily catheterized with a #7 open  [place] stent.  Left ureter despite multiple attempts would not admit a  #3  French guidewire and/or #4 open __________ stent despite multiple attempts.  Catheter went about 2 cm and would not advance beyond said location.   DESCRIPTION OF PROCEDURE:  Patient prepped and draped in usual fashion and  placed in lithotomy position.  Betadine solution used for antiseptic.  Afterwards a cystoscopy was performed and a #7 open [place] stent was placed  through the right ureteral orifice and advanced about 20 cm.  The left  distal portion of the ureter, however, could not be stented despite multiple  attempts as mentioned above.   ADDENDUM:  Note, the patient was opened followed these attempts.  Significant left hydroureter noted with extensive narrowing of the  ureterovesical junction.  Said narrowing was significant with adhesive  disease around distal ureter.     Stev  SHB/MEDQ  D:  03/12/2004  T:  03/12/2004  Job:  [id number]

## 2010-09-04 NOTE — H&P (Signed)
[name], [name]                  ACCOUNT NO.:  [id number]   MEDICAL RECORD NO.:  [id number]          PATIENT TYPE:  INP   LOCATION:  9316                          [facility]ILITY[facility]   PHYSICIAN:  Freddy Finner, M.D.   DATE OF BIRTH:  10/22/1960   DATE OF ADMISSION:  03/11/2004  DATE OF DISCHARGE:                                HISTORY & PHYSICAL   ADMISSION DIAGNOSES:  1.  Complex cystic pelvic mass.  2.  Obstruction of left ureter.   The patient is a 50 year old, black, married female nulligravida who had  total abdominal hysterectomy, bilateral salpingo-oophorectomy in September  of 2004. At that surgery, there were massive pelvic adhesions with extensive  pelvic endometriosis and while reviewing the operative note, there were only  what appeared to be a remnant of the left tube and ovary present at the time  of that surgery and this was resected. The patient had done well until  approximately 2-3 days prior to this admission when she noted the onset of  severe left lower quadrant pelvic pain. She presented to the office on the  day prior to this admission for evaluation of that pain and was found to  have a large pelvic mass which was approximately 11 cm and complex with  cystic and solid components on pelvic ultrasound.  CT of the abdomen and  pelvis today with contrast was performed and there was a finding of an  obstruction of the left ureter. The mass was much as described by the pelvic  ultrasound. There was no evidence of ascites and no other apparent  abnormality within the abdomen or at the lung bases.  She is now admitted  for laparotomy with a ureteral catheter as part of her operative procedure.   REVIEW OF SYMPTOMS:  She denies fever, nausea, vomiting, diarrhea or urinary  symptoms except for urinary frequency.  The pain is described as somewhat  colicky in nature but is fairly persistent at all times.   PAST MEDICAL HISTORY:  The patient is noted to be hypertensive  for which she  takes Univasc 7.5 mg a day and hydrochlorothiazide 25 mg a day for  hypertension. Her only other chronic medication is Climara 0.1 mg patch for  hormone replacement therapy.  She has never had a blood transfusions. She  has no known allergies to medications. She is not a cigarette smoker. She  does not use alcohol.   FAMILY HISTORY:  Noncontributory.   PHYSICAL EXAMINATION:  HEENT:  Grossly within normal limits. Thyroid gland  is not palpably enlarged.  VITAL SIGNS:  Blood pressure 139/78.  CHEST:  Clear to auscultation throughout.  HEART:  Normal sinus rhythm without murmurs, rubs or gallops.  ABDOMEN:  Mildly tender to deep palpation in the left lower quadrant. There  is no appreciable organomegaly, no CVA tenderness, no palpable masses on  abdominal exam.  EXTREMITIES:  No cyanosis, clubbing or edema.  PELVIC:  External genitalia, vagina and cervix are normal. Bimanual and  rectovaginal identify a large approximately 10-12 cm cystic mass filling the  pelvis in  the midline and extending to the left.   ASSESSMENT:  1.  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  2.  Obstruction of left ureter.   PLAN:  Dr. Alfonzo Feller will place ureteral stents and then will proceed  exploratory laparotomy and resection of the pelvic mass.     [name]   WRN/MEDQ  D:  03/11/2004  T:  03/11/2004  Job:  [id number]

## 2010-09-04 NOTE — Discharge Summary (Signed)
[name], [name]                  ACCOUNT NO.:  [id number]   MEDICAL RECORD NO.:  [id number]          PATIENT TYPE:  INP   LOCATION:  9316                          [facility]   PHYSICIAN:  [name], M.D.   DATE OF BIRTH:  [birth year]   DATE OF ADMISSION:  03/11/2004  DATE OF DISCHARGE:  03/17/2004                                 DISCHARGE SUMMARY   DISCHARGE DIAGNOSES:  1.  Large benign hemorrhagic ovarian cyst, left, no evidence of malignancy.  2.  Extensive intraabdominal adhesions.  3.  Preoperative obstruction of left ureter.   OPERATIVE PROCEDURE:  Stenting of right ureter and failed attempt to  catheterize and stent left ureter, followed by exploratory laparotomy with  extensive intraabdominal and pelvic adhesiolysis and resection of large  ovarian remnant with large associated cyst.   INTRAOPERATIVE COMPLICATIONS:  Transsection of left ureter, followed by  reimplantation of left ureter by Dr. Vic Porter.   POSTOPERATIVE COMPLICATIONS:  None, though she did have an anticipated  significant postoperative ileus.   Details of the present illness are recorded in the admission History and  Physical.  Briefly, the patient presented with left-sided pain which at  least clinically seemed consistent with nephrolithiasis.  In the course of  her workup she was found to have a complex large cystic pelvic mass and  complete obstruction of the left ureter, presumably by this mass.  This  prompted admission for emergency surgery.  Her other physical findings were  considered to be normal.   Laboratory data during this admission includes a CBC on admission with  hemoglobin of 12.5, postoperative hemoglobin were 11.8 on postoperative day  #1, 9.9 on postoperative day #2.  Her admission chemistry profile was  normal.  Her admission prothrombin time and PTT were normal.  A CT scan of  the abdomen did show the large pelvic mass and left hydronephrosis with a  high-grade  obstruction of the left ureter.  Ultrasound findings done in the  office prior to admission also showed a complex cystic mass.   HOSPITAL COURSE:  Because of the ureteral obstruction it was elected to  proceed with a relatively urgent surgical procedure after the CT scan showed  obstruction of the left ureter.  Dr. Blima Porter was asked to assist and  attempted to catheterize or stent the left ureter.  This could not be done  technically.  This later became apparent at the laparotomy that the ureter  on the left was obstructed and stenotic.  The pelvic mass and extensive  adhesions were removed.  In the process of doing this in stripping the mass  away from the ureter, the ureter was transected.  Dr. Vic Porter was  consulted intraoperatively and performed a reimplantation procedure.  Postoperatively, the patient remained essentially afebrile throughout her  hospital stay.  She did have a mild to moderate ileus which responded to  conservative management.  This seemed appropriate for the lengthy procedure  which she did have.  She also had a stent that was left in place to be  removed in  approximately 2 weeks which created some difficulty with pain,  but manageable with oral medications after approximately 2 days.  By  postoperative day #6 she was still afebrile, she was tolerating a regular  diet at that time after resolution of the ileus.  She was ambulating without  difficulty.  She was having adequate bowel function.  Her Foley catheter was  left in place.  She is instructed to have follow-up appointments with Dr.  Aldean Porter in approximately 1 week for Foley removal and approximately 4  weeks for stent removal.  She is to return to my office in approximately 10  days for wound care.   She was given the following medications:  1.  She is to take Percocet 5/325 for postoperative pain.  2.  She is given Ambien 10 mg to be used at night as needed for sleep.  3.  She is given  Vivelle Dot 0.1 for estrogen replacement therapy.  4.  She is to take Colace as a stool softener.  5.  She was given Ceftin to take 250 mg b.i.d. for 10 days.  6.  She was given Pyridium 200 mg to be taken t.i.d. for urinary tract spasm      and analgesia.   She is further instructed to call for fever, for significant bleeding or  drainage from the incision.  She is to avoid heavy lifting.  She is to take  her regular diet.     [name]   WRN/MEDQ  D:  04/23/2004  T:  04/23/2004  Job:  [id number]   cc:   Andrea Carne, MD  Fax: [phone]   [name]., M.D.  [address]  [place]  [place] [address]  Fax: [phone]

## 2010-09-04 NOTE — Discharge Summary (Signed)
   [name], PATIENT                            ACCOUNT NO.:  [id number]   MEDICAL RECORD NO.:  [id number]                   PATIENT TYPE:  INP   LOCATION:  9322                                 [facility]ILITY[facility]   PHYSICIAN:  Freddy Finner, M.D.                DATE OF BIRTH:  [birth year]   DATE OF ADMISSION:  01/03/2003  DATE OF DISCHARGE:  01/06/2003                                 DISCHARGE SUMMARY   DISCHARGE DIAGNOSES:  1. Uterine leiomyomata.  2. Pelvic endometriosis.  3. 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  4. Benign left endometriotic cyst.  5. Extensive pelvic adhesions.   OPERATIVE PROCEDURE:  Total abdominal hysterectomy, salpingo-oophorectomy.   COMPLICATIONS:  None.   POSTOPERATIVE EVENTS:  Mild postoperative ileus delaying discharge.   DISPOSITION:  The patient was in satisfactory and improved condition at the  time of her discharge.  Her mild ileus had resolved.  She was ambulating  without difficulty,  having normal bowel and bladder function.  She was  discharged home with progressively increasing physical activity for follow-  up in the office in two weeks.  She was given Tylox to be taken as needed  for postoperative pain.  She was given Prempro 2.5 to be taken daily.  She  is to take an iron supplement daily.  She is to have a regular diet.   Details of the present illness, past history, family history, review of  systems, and physical exam are recorded in the admission note.  The patient  was admitted on the morning of surgery.  The above-described operative  procedure was accomplished without difficulty.  Intrahospital laboratory  data included admission hemoglobin of 11.5, postoperative hemoglobin of 11.1  immediately after surgery, 11.2 on postoperative day #1, and 9.4 on  postoperative day #2.  These were consistent with the estimated  intraoperative blood loss.  Her prothrombin time and PTT on admission were  normal as was  her routine chemistry profile and urinalysis.   The patient's postoperative course was without significant complications.  She remained afebrile throughout the hospital stay.  By the morning of  postoperative day #3 her condition was considered to be satisfactory.  Her  mild ileus had resolved and she was discharged home with disposition as  noted above.                                               Freddy Finner, M.D.    WRN/MEDQ  D:  01/22/2003  T:  01/22/2003  Job:  [id number]

## 2010-09-04 NOTE — H&P (Signed)
NAMESHAMYIA, GRANDPRE                            ACCOUNT NO.:  [id number]   MEDICAL RECORD NO.:  [id number]                   PATIENT TYPE:  INP   LOCATION:  NA                                   [facility]   PHYSICIAN:  Freddy Finner, M.D.                DATE OF BIRTH:  1961/03/12   DATE OF ADMISSION:  01/02/2003  DATE OF DISCHARGE:                                HISTORY & PHYSICAL   ADMISSION DIAGNOSES:  1. Fibroids with extensive pelvic endometriosis.  2. Persistent left adnexal mass.  3. Previous conservative surgery for endometriosis.   HISTORY OF PRESENT ILLNESS:  The patient is a 50 year old, African-American,  married female, nulligravida who has some extensive past history of  endometriosis with previous laparoscopy and findings described as bilateral  endometriomas, fibroids with extensive pelvic adhesions.  She has had  extensive therapy and has pursued fertility treatments in the past.  At the  present time, she is having chronic pain.  She has reached a point in her  life where she does not wish to have further pregnancies and she is now  admitted for definitive surgery.   REVIEW OF SYMPTOMS:  Current review of systems is otherwise negative.  There  are no cardiopulmonary, GI or GU complaints.   PAST MEDICAL HISTORY:  Hypertension for which she takes Univasc 7.5 mg a day  and hydrochlorothiazide daily.  She has no other known significant medical  illnesses.   ALLERGIES:  No known drug allergies.   MEDICATIONS:  She is on no other medications on a chronic basis.  She has  never had a blood transfusion.   SOCIAL HISTORY:  She does not use alcohol.   FAMILY HISTORY:  Noncontributory.   PHYSICAL EXAMINATION:  HEENT:  Grossly within normal limits.  NECK:  Thyroid gland is not palpably enlarged.  VITAL SIGNS:  Most recent blood pressure in the office was 126/84.  CHEST:  Clear to auscultation.  HEART:  Normal sinus rhythm without murmurs, rubs or gallops.  ABDOMEN:  Soft, nontender without appreciable organomegaly or palpable  masses.  PELVIC:  External genitalia, vagina and cervix are normal.  Bimanual reveals  the uterus to be anterior in position.  It is a difficult exam because of  obesity, but there is no palpable enlargement of the uterus to clinical  exam.  There are no palpable adnexal masses at the present time.  Again,  because of obesity, we recommend rectovaginal exam to confirm these  findings.   LABORATORY DATA AND X-RAY FINDINGS:  Previous pelvic ultrasound in the  office has shown enlargement of the uterus with at least five myomas noted  throughout the uterus with one thought to be degeneration.   ASSESSMENT:  1. Significant past history of extensive pelvic endometriosis with     adhesions.  2. Definite documented uterine leiomyomata.  3. Clinical history of chronic pelvic  pain.   PLAN:  The patient is admitted now for a total abdominal hysterectomy and  bilateral salingo-oophorectomy.                                               Freddy Finner, M.D.    WRN/MEDQ  D:  01/02/2003  T:  01/02/2003  Job:  [id number]

## 2010-10-14 ENCOUNTER — Other Ambulatory Visit: Payer: Self-pay | Admitting: Family Medicine

## 2011-01-13 ENCOUNTER — Encounter: Payer: Self-pay | Admitting: Family Medicine

## 2011-01-14 ENCOUNTER — Ambulatory Visit (INDEPENDENT_AMBULATORY_CARE_PROVIDER_SITE_OTHER): Payer: Managed Care, Other (non HMO) | Admitting: Family Medicine

## 2011-01-14 ENCOUNTER — Encounter: Payer: Self-pay | Admitting: Family Medicine

## 2011-01-14 VITALS — BP 124/76 | HR 95 | Temp 98.8°F | Wt 279.2 lb

## 2011-01-14 DIAGNOSIS — K219 Gastro-esophageal reflux disease without esophagitis: Secondary | ICD-10-CM

## 2011-01-14 DIAGNOSIS — E785 Hyperlipidemia, unspecified: Secondary | ICD-10-CM

## 2011-01-14 DIAGNOSIS — I10 Essential (primary) hypertension: Secondary | ICD-10-CM

## 2011-01-14 MED ORDER — ESOMEPRAZOLE MAGNESIUM 40 MG PO CPDR: 40.0000 mg | DELAYED_RELEASE_CAPSULE | Freq: Every day | ORAL | Status: DC

## 2011-01-14 NOTE — Patient Instructions (Signed)
Esophagitis (Heartburn) Esophagitis (heartburn) is a painful, burning sensation in the chest. It may feel worse in certain positions, such as lying down or bending over. It is caused by stomach acid backing up into the tube that carries food from the mouth down to the stomach (lower esophagus). TREATMENT There are a number of non-prescription medicines used to treat heartburn, including:  Antacids.   Acid reducers (also called H-2 blockers).   Proton-pump inhibitors.  HOME CARE INSTRUCTIONS  Raise the head of your bead by putting blocks under the legs.   Eat 2-3 hours before going to bed.   Stop smoking.   Try to reach and maintain a healthy weight.   Do not eat just a few very large meals. Instead, eat many smaller meals throughout the day.   Try to identify foods and beverages that make your symptoms worse, and avoid these.   Avoid tight clothing.   Do not exercise right after eating.  SEEK IMMEDIATE MEDICAL CARE IF YOU:  Have severe chest pain that goes down your arm, or into your jaw or neck.   Feel sweaty, dizzy, or lightheaded.   Are short of breath.   Throw up (vomit) blood.   Have difficulty or pain with swallowing.   Have bloody or black, tarry stools.   Have bouts of heartburn more than three times a week for more than two weeks.  Document Released: 05/13/2004 Document Re-Released: 06/30/2009 [employer] Patient Information 2011 [employer]. 

## 2011-01-14 NOTE — Progress Notes (Signed)
  Subjective:    Patient here for follow-up of elevated blood pressure.  She is not exercising and is adherent to a low-salt diet.  Blood pressure is well controlled at home. Cardiac symptoms: none. Patient denies: chest pain, chest pressure/discomfort, claudication, dyspnea, exertional chest pressure/discomfort, fatigue, irregular heart beat, lower extremity edema, near-syncope, orthopnea, palpitations, paroxysmal nocturnal dyspnea, syncope and tachypnea. Cardiovascular risk factors: dyslipidemia, hypertension, obesity (BMI >= 30 kg/m2) and sedentary lifestyle. Use of agents associated with hypertension: none. History of target organ damage: none.  Pt also stopped her nexium because she didn't feel like she needed it anymore.   Pt c/o midepigastric tenderness.  Pt las GI visit 1 year ago.  The following portions of the patient's history were reviewed and updated as appropriate: allergies, current medications, past family history, past medical history, past social history, past surgical history and problem list.  Review of Systems Pertinent items are noted in HPI.     Objective:    BP 124/76  Pulse 95  Temp(Src) 98.8 F (37.1 C) (Oral)  Wt 279 lb 3.2 oz (126.644 kg)  SpO2 97% General appearance: alert, cooperative, appears stated age and no distress Throat: lips, mucosa, and tongue normal; teeth and gums normal Neck: no adenopathy Lungs: clear to auscultation bilaterally Abdomen: normal findings: bowel sounds normal, no masses palpable and no organomegaly and abnormal findings:  moderate tenderness in the epigastrium    Assessment:    Hypertension, normal blood pressure . Evidence of target organ damage: none.   GERD----restart nexium 40 mg daily---f/u GI if no better Plan:    Medication: no change. Dietary sodium restriction. Regular aerobic exercise. Check blood pressures 2-3 times weekly and record. Follow up: 3 months and as needed.

## 2011-01-15 ENCOUNTER — Other Ambulatory Visit: Payer: Self-pay | Admitting: Family Medicine

## 2011-01-15 DIAGNOSIS — K219 Gastro-esophageal reflux disease without esophagitis: Secondary | ICD-10-CM

## 2011-01-15 DIAGNOSIS — I10 Essential (primary) hypertension: Secondary | ICD-10-CM

## 2011-01-15 DIAGNOSIS — E785 Hyperlipidemia, unspecified: Secondary | ICD-10-CM

## 2011-01-18 ENCOUNTER — Other Ambulatory Visit (INDEPENDENT_AMBULATORY_CARE_PROVIDER_SITE_OTHER): Payer: Managed Care, Other (non HMO)

## 2011-01-18 DIAGNOSIS — E785 Hyperlipidemia, unspecified: Secondary | ICD-10-CM

## 2011-01-18 DIAGNOSIS — R319 Hematuria, unspecified: Secondary | ICD-10-CM

## 2011-01-18 DIAGNOSIS — K219 Gastro-esophageal reflux disease without esophagitis: Secondary | ICD-10-CM

## 2011-01-18 DIAGNOSIS — I10 Essential (primary) hypertension: Secondary | ICD-10-CM

## 2011-01-18 LAB — POCT URINALYSIS DIPSTICK
Bilirubin, UA: NEGATIVE
Ketones, UA: NEGATIVE
Nitrite, UA: NEGATIVE
pH, UA: 5

## 2011-01-18 LAB — BASIC METABOLIC PANEL
CO2: 27 mEq/L (ref 19–32)
Calcium: 8.5 mg/dL (ref 8.4–10.5)
Creatinine, Ser: 0.9 mg/dL (ref 0.4–1.2)
GFR: 86.33 mL/min (ref >60.00)
Glucose, Bld: 85 mg/dL (ref 70–99)
Sodium: 143 mEq/L (ref 135–145)

## 2011-01-18 LAB — LIPID PANEL
HDL: 58.8 mg/dL (ref >39.00)
Triglycerides: 85 mg/dL (ref 0.0–149.0)
VLDL: 17 mg/dL (ref 0.0–40.0)

## 2011-01-18 LAB — HEPATIC FUNCTION PANEL
Albumin: 3.6 g/dL (ref 3.5–5.2)
Bilirubin, Direct: 0 mg/dL (ref 0.0–0.3)
Total Protein: 7.3 g/dL (ref 6.0–8.3)

## 2011-01-18 NOTE — Progress Notes (Signed)
Labs only

## 2011-01-19 ENCOUNTER — Other Ambulatory Visit: Payer: Self-pay | Admitting: Family Medicine

## 2011-01-19 DIAGNOSIS — K219 Gastro-esophageal reflux disease without esophagitis: Secondary | ICD-10-CM

## 2011-01-19 MED ORDER — ESOMEPRAZOLE MAGNESIUM 40 MG PO CPDR: 40.0000 mg | DELAYED_RELEASE_CAPSULE | Freq: Every day | ORAL | Status: DC

## 2011-01-19 NOTE — Telephone Encounter (Signed)
Rx faxed.    KP 

## 2011-01-20 LAB — URINE CULTURE: Colony Count: 25000

## 2011-02-01 ENCOUNTER — Telehealth: Payer: Self-pay | Admitting: *Deleted

## 2011-02-01 NOTE — Telephone Encounter (Signed)
Pt should get a copy of her formulary to [place] so we can pick one that is covered.

## 2011-02-01 NOTE — Telephone Encounter (Signed)
Rec fax stating Nexium is no longer covered. Pharmacy could not provide covered alternatives. Please advise.

## 2011-02-01 NOTE — Telephone Encounter (Signed)
Pt informed. She will check with ins for preferred alternatives and let us know what they are.

## 2011-05-20 ENCOUNTER — Ambulatory Visit (INDEPENDENT_AMBULATORY_CARE_PROVIDER_SITE_OTHER): Payer: Managed Care, Other (non HMO) | Admitting: Family Medicine

## 2011-05-20 ENCOUNTER — Encounter: Payer: Self-pay | Admitting: Family Medicine

## 2011-05-20 ENCOUNTER — Telehealth: Payer: Self-pay | Admitting: *Deleted

## 2011-05-20 DIAGNOSIS — I10 Essential (primary) hypertension: Secondary | ICD-10-CM

## 2011-05-20 DIAGNOSIS — R079 Chest pain, unspecified: Secondary | ICD-10-CM

## 2011-05-20 MED ORDER — AMLODIPINE BESYLATE 5 MG PO TABS: 5.0000 mg | ORAL_TABLET | Freq: Every day | ORAL | Status: DC

## 2011-05-20 MED ORDER — LOSARTAN POTASSIUM-HCTZ 100-25 MG PO TABS: ORAL_TABLET | ORAL | Status: DC

## 2011-05-20 NOTE — Telephone Encounter (Signed)
Pt c/o headache for to pass few days. Pt denies any chest pain, SOB, numbness, tingling in extremities, or blurred vision. Pt does not have BP cuff so she is unable to verify what BP is now of has been for the pass few days that she has had symptom.Pt notes that she does not have a history of migraines but does have stress related headache. Pt scheduled to come in this afternoon, Pt offer earlier appt but is only able to come in at current time. .Please advise if ok to have Pt wait to 4:15 pm or does she needs to be evaluated some where else.

## 2011-05-20 NOTE — Telephone Encounter (Signed)
Ideally it would be better to see her sooner --- if symptoms worsen she should go to er

## 2011-05-20 NOTE — Telephone Encounter (Signed)
Discuss with patient  

## 2011-05-20 NOTE — Progress Notes (Signed)
  Subjective:    Patient here for follow-up of elevated blood pressure.  She is not exercising and is adherent to a low-salt diet.  Blood pressure is not well controlled at home. Cardiac symptoms: chest pain. Patient denies: dyspnea, exertional chest pressure/discomfort, fatigue, irregular heart beat, lower extremity edema, near-syncope, orthopnea, palpitations, paroxysmal nocturnal dyspnea, syncope and tachypnea. Cardiovascular risk factors: hypertension, obesity (BMI >= 30 kg/m2) and sedentary lifestyle. Use of agents associated with hypertension: none. History of target organ damage: none.  The following portions of the patient's history were reviewed and updated as appropriate: allergies, current medications, past family history, past medical history, past social history, past surgical history and problem list.  Review of Systems Pertinent items are noted in HPI.     Objective:    BP 134/90  Pulse 92  Temp(Src) 99.1 F (37.3 C) (Oral)  Wt 292 lb (132.45 kg)  SpO2 96% General appearance: alert, cooperative, appears stated age, no distress and morbidly obese Lungs: clear to auscultation bilaterally Heart: S1, S2 normal Extremities: extremities normal, atraumatic, no cyanosis or edema    Assessment:    Hypertension, stage 1 . Evidence of target organ damage: none.    Plan:    Medication: continue losartan and begin norvasc. Dietary sodium restriction. Regular aerobic exercise. Follow up: 2 weeks and as needed.

## 2011-05-20 NOTE — Patient Instructions (Signed)

## 2011-06-04 ENCOUNTER — Encounter: Payer: Self-pay | Admitting: Family Medicine

## 2011-06-04 ENCOUNTER — Ambulatory Visit (INDEPENDENT_AMBULATORY_CARE_PROVIDER_SITE_OTHER): Payer: Managed Care, Other (non HMO) | Admitting: Family Medicine

## 2011-06-04 VITALS — BP 122/90 | HR 115 | Temp 98.2°F | Wt 282.8 lb

## 2011-06-04 DIAGNOSIS — I10 Essential (primary) hypertension: Secondary | ICD-10-CM

## 2011-06-04 DIAGNOSIS — J329 Chronic sinusitis, unspecified: Secondary | ICD-10-CM

## 2011-06-04 MED ORDER — CEFUROXIME AXETIL 500 MG PO TABS: 500.0000 mg | ORAL_TABLET | Freq: Two times a day (BID) | ORAL | Status: AC

## 2011-06-04 MED ORDER — FLUTICASONE PROPIONATE 50 MCG/ACT NA SUSP: 2.0000 | Freq: Every day | NASAL | Status: DC

## 2011-06-04 NOTE — Progress Notes (Signed)
  Subjective:     [name] is a 51 y.o. female who presents for evaluation of sinus pain. Symptoms include: clear rhinorrhea, congestion, nasal congestion, post nasal drip, sinus pressure and low grade fever and productive cough. Onset of symptoms was 1 week ago. Symptoms have been gradually worsening since that time. Past history is significant for no history of pneumonia or bronchitis. Patient is a non-smoker.  Pt also her for bp check.  Headaches are better  The following portions of the patient's history were reviewed and updated as appropriate: allergies, current medications, past family history, past medical history, past social history, past surgical history and problem list.  Review of Systems Pertinent items are noted in HPI.   Objective:    BP 122/90  Pulse 115  Temp(Src) 98.2 F (36.8 C) (Oral)  Wt 282 lb 12.8 oz (128.277 kg)  SpO2 95% General appearance: alert, cooperative, appears stated age and mild distress Ears: normal TM's and external ear canals both ears Nose: clear discharge, moderate congestion, turbinates red, swollen, edematous, sinus tenderness bilateral Throat: lips, mucosa, and tongue normal; teeth and gums normal Neck: mild anterior cervical adenopathy, supple, symmetrical, trachea midline and thyroid not enlarged, symmetric, no tenderness/mass/nodules Lungs: clear to auscultation bilaterally Heart: S1, S2 normal Extremities: extremities normal, atraumatic, no cyanosis or edema    Assessment:    Acute bacterial sinusitis.  HTN--- slightly high today but pt has been sick   Plan:    Nasal steroids per medication orders. Antihistamines per medication orders. Ceftin per medication orders. f/u prn -----2-3 weeks for bp check

## 2011-06-04 NOTE — Patient Instructions (Signed)

## 2011-06-18 ENCOUNTER — Ambulatory Visit: Payer: Managed Care, Other (non HMO) | Admitting: Family Medicine

## 2011-06-21 ENCOUNTER — Ambulatory Visit (INDEPENDENT_AMBULATORY_CARE_PROVIDER_SITE_OTHER): Payer: Managed Care, Other (non HMO) | Admitting: Family Medicine

## 2011-06-21 ENCOUNTER — Encounter: Payer: Self-pay | Admitting: Family Medicine

## 2011-06-21 VITALS — BP 130/84 | HR 98 | Temp 98.5°F | Wt 290.0 lb

## 2011-06-21 DIAGNOSIS — E876 Hypokalemia: Secondary | ICD-10-CM

## 2011-06-21 DIAGNOSIS — I10 Essential (primary) hypertension: Secondary | ICD-10-CM

## 2011-06-21 MED ORDER — POTASSIUM CHLORIDE 20 MEQ PO PACK: 20.0000 meq | PACK | Freq: Two times a day (BID) | ORAL | Status: DC

## 2011-06-21 MED ORDER — AMLODIPINE BESYLATE 5 MG PO TABS: 5.0000 mg | ORAL_TABLET | Freq: Every day | ORAL | Status: DC

## 2011-06-21 NOTE — Progress Notes (Signed)
  Subjective:    Patient here for follow-up of elevated blood pressure.  She  is adherent to a low-salt diet.  Blood pressure is well controlled at home. Cardiac symptoms: none. Patient denies: chest pain, chest pressure/discomfort, claudication, dyspnea, exertional chest pressure/discomfort, fatigue, irregular heart beat, lower extremity edema, near-syncope, orthopnea, palpitations, paroxysmal nocturnal dyspnea, syncope and tachypnea. Cardiovascular risk factors: hypertension and obesity (BMI >= 30 kg/m2). Use of agents associated with hypertension: none. History of target organ damage: none.  The following portions of the patient's history were reviewed and updated as appropriate: allergies, current medications, past family history, past medical history, past social history, past surgical history and problem list.  Review of Systems Pertinent items are noted in HPI.     Objective:    BP 130/84  Pulse 98  Temp(Src) 98.5 F (36.9 C) (Oral)  Wt 290 lb (131.543 kg)  SpO2 96% General appearance: alert, cooperative, appears stated age and no distress Lungs: clear to auscultation bilaterally Heart: regular rate and rhythm, S1, S2 normal, no murmur, click, rub or gallop Extremities: extremities normal, atraumatic, no cyanosis or edema    Assessment:    Hypertension, normal blood pressure . Evidence of target organ damage: none.    Plan:    Medication: no change. Dietary sodium restriction. Regular aerobic exercise. Follow up: 3 months and as needed.

## 2011-06-21 NOTE — Patient Instructions (Signed)

## 2011-07-14 ENCOUNTER — Ambulatory Visit: Payer: Managed Care, Other (non HMO) | Admitting: Family Medicine

## 2011-07-21 ENCOUNTER — Ambulatory Visit (INDEPENDENT_AMBULATORY_CARE_PROVIDER_SITE_OTHER): Payer: Managed Care, Other (non HMO) | Admitting: Family Medicine

## 2011-07-21 ENCOUNTER — Encounter: Payer: Self-pay | Admitting: Family Medicine

## 2011-07-21 VITALS — BP 128/90 | HR 74 | Temp 98.0°F | Ht 69.0 in | Wt 296.6 lb

## 2011-07-21 DIAGNOSIS — E669 Obesity, unspecified: Secondary | ICD-10-CM

## 2011-07-21 DIAGNOSIS — Z0000 Encounter for general adult medical examination without abnormal findings: Secondary | ICD-10-CM

## 2011-07-21 DIAGNOSIS — R319 Hematuria, unspecified: Secondary | ICD-10-CM

## 2011-07-21 DIAGNOSIS — I10 Essential (primary) hypertension: Secondary | ICD-10-CM

## 2011-07-21 DIAGNOSIS — M1990 Unspecified osteoarthritis, unspecified site: Secondary | ICD-10-CM

## 2011-07-21 LAB — POCT URINALYSIS DIPSTICK
Bilirubin, UA: NEGATIVE
Ketones, UA: NEGATIVE
Leukocytes, UA: NEGATIVE
Spec Grav, UA: >=1.03
pH, UA: 6

## 2011-07-21 LAB — CBC WITH DIFFERENTIAL/PLATELET
Basophils Absolute: 0 10*3/uL (ref 0.0–0.1)
Basophils Relative: 0.7 % (ref 0.0–3.0)
Eosinophils Absolute: 0.1 10*3/uL (ref 0.0–0.7)
HCT: 36.8 % (ref 36.0–46.0)
Hemoglobin: 12.2 g/dL (ref 12.0–15.0)
Lymphs Abs: 1.5 10*3/uL (ref 0.7–4.0)
MCHC: 33.2 g/dL (ref 30.0–36.0)
MCV: 86.5 fl (ref 78.0–100.0)
Monocytes Absolute: 0.3 10*3/uL (ref 0.1–1.0)
Neutro Abs: 1.9 10*3/uL (ref 1.4–7.7)
RBC: 4.25 Mil/uL (ref 3.87–5.11)
RDW: 14.4 % (ref 11.5–14.6)

## 2011-07-21 LAB — BASIC METABOLIC PANEL
CO2: 25 mEq/L (ref 19–32)
Chloride: 110 mEq/L (ref 96–112)
Glucose, Bld: 84 mg/dL (ref 70–99)
Sodium: 147 mEq/L — ABNORMAL HIGH (ref 135–145)

## 2011-07-21 LAB — HEPATIC FUNCTION PANEL
Albumin: 3.8 g/dL (ref 3.5–5.2)
Alkaline Phosphatase: 56 U/L (ref 39–117)
Total Protein: 7.5 g/dL (ref 6.0–8.3)

## 2011-07-21 LAB — LIPID PANEL
Cholesterol: 194 mg/dL (ref 0–200)
HDL: 54.8 mg/dL (ref >39.00)
Triglycerides: 132 mg/dL (ref 0.0–149.0)
VLDL: 26.4 mg/dL (ref 0.0–40.0)

## 2011-07-21 NOTE — Assessment & Plan Note (Signed)
D/w pt diet and exercise  

## 2011-07-21 NOTE — Assessment & Plan Note (Signed)
Per ortho.  

## 2011-07-21 NOTE — Progress Notes (Signed)
Addended by: Silvio Pate D on: 07/21/2011 11:42 AM   Modules accepted: Orders

## 2011-07-21 NOTE — Assessment & Plan Note (Signed)
con't meds con't diet and exercise 

## 2011-07-21 NOTE — Patient Instructions (Signed)
Preventive Care for Adults, Female A healthy lifestyle and preventive care can promote health and wellness. Preventive health guidelines for women include the following key practices.  A routine yearly physical is a good way to check with your caregiver about your health and preventive screening. It is a chance to share any concerns and updates on your health, and to receive a thorough exam.   Visit your dentist for a routine exam and preventive care every 6 months. Brush your teeth twice a day and floss once a day. Good oral hygiene prevents tooth decay and gum disease.   The frequency of eye exams is based on your age, health, family medical history, use of contact lenses, and other factors. Follow your caregiver's recommendations for frequency of eye exams.   Eat a healthy diet. Foods like vegetables, fruits, whole grains, low-fat dairy products, and lean protein foods contain the nutrients you need without too many calories. Decrease your intake of foods high in solid fats, added sugars, and salt. Eat the right amount of calories for you.Get information about a proper diet from your caregiver, if necessary.   Regular physical exercise is one of the most important things you can do for your health. Most adults should get at least 150 minutes of moderate-intensity exercise (any activity that increases your heart rate and causes you to sweat) each week. In addition, most adults need muscle-strengthening exercises on 2 or more days a week.   Maintain a healthy weight. The body mass index (BMI) is a screening tool to identify possible weight problems. It provides an estimate of body fat based on height and weight. Your caregiver can help determine your BMI, and can help you achieve or maintain a healthy weight.For adults 20 years and older:   A BMI below 18.5 is considered underweight.   A BMI of 18.5 to 24.9 is normal.   A BMI of 25 to 29.9 is considered overweight.   A BMI of 30 and above is  considered obese.   Maintain normal blood lipids and cholesterol levels by exercising and minimizing your intake of saturated fat. Eat a balanced diet with plenty of fruit and vegetables. Blood tests for lipids and cholesterol should begin at age 20 and be repeated every 5 years. If your lipid or cholesterol levels are high, you are over 50, or you are at high risk for heart disease, you may need your cholesterol levels checked more frequently.Ongoing high lipid and cholesterol levels should be treated with medicines if diet and exercise are not effective.   If you smoke, find out from your caregiver how to quit. If you do not use tobacco, do not start.   If you are pregnant, do not drink alcohol. If you are breastfeeding, be very cautious about drinking alcohol. If you are not pregnant and choose to drink alcohol, do not exceed 1 drink per day. One drink is considered to be 12 ounces (355 mL) of beer, 5 ounces (148 mL) of wine, or 1.5 ounces (44 mL) of liquor.   Avoid use of street drugs. Do not share needles with anyone. Ask for help if you need support or instructions about stopping the use of drugs.   High blood pressure causes heart disease and increases the risk of stroke. Your blood pressure should be checked at least every 1 to 2 years. Ongoing high blood pressure should be treated with medicines if weight loss and exercise are not effective.   If you are 55 to 51   years old, ask your caregiver if you should take aspirin to prevent strokes.   Diabetes screening involves taking a blood sample to check your fasting blood sugar level. This should be done once every 3 years, after age 45, if you are within normal weight and without risk factors for diabetes. Testing should be considered at a younger age or be carried out more frequently if you are overweight and have at least 1 risk factor for diabetes.   Breast cancer screening is essential preventive care for women. You should practice "breast  self-awareness." This means understanding the normal appearance and feel of your breasts and may include breast self-examination. Any changes detected, no matter how small, should be reported to a caregiver. Women in their 20s and 30s should have a clinical breast exam (CBE) by a caregiver as part of a regular health exam every 1 to 3 years. After age 40, women should have a CBE every year. Starting at age 40, women should consider having a mammography (breast X-ray test) every year. Women who have a family history of breast cancer should talk to their caregiver about genetic screening. Women at a high risk of breast cancer should talk to their caregivers about having magnetic resonance imaging (MRI) and a mammography every year.   The Pap test is a screening test for cervical cancer. A Pap test can show cell changes on the cervix that might become cervical cancer if left untreated. A Pap test is a procedure in which cells are obtained and examined from the lower end of the uterus (cervix).   Women should have a Pap test starting at age 21.   Between ages 21 and 29, Pap tests should be repeated every 2 years.   Beginning at age 30, you should have a Pap test every 3 years as long as the past 3 Pap tests have been normal.   Some women have medical problems that increase the chance of getting cervical cancer. Talk to your caregiver about these problems. It is especially important to talk to your caregiver if a new problem develops soon after your last Pap test. In these cases, your caregiver may recommend more frequent screening and Pap tests.   The above recommendations are the same for women who have or have not gotten the vaccine for human papillomavirus (HPV).   If you had a hysterectomy for a problem that was not cancer or a condition that could lead to cancer, then you no longer need Pap tests. Even if you no longer need a Pap test, a regular exam is a good idea to make sure no other problems are  starting.   If you are between ages 65 and 70, and you have had normal Pap tests going back 10 years, you no longer need Pap tests. Even if you no longer need a Pap test, a regular exam is a good idea to make sure no other problems are starting.   If you have had past treatment for cervical cancer or a condition that could lead to cancer, you need Pap tests and screening for cancer for at least 20 years after your treatment.   If Pap tests have been discontinued, risk factors (such as a new sexual partner) need to be reassessed to determine if screening should be resumed.   The HPV test is an additional test that may be used for cervical cancer screening. The HPV test looks for the virus that can cause the cell changes on the cervix.   The cells collected during the Pap test can be tested for HPV. The HPV test could be used to screen women aged 30 years and older, and should be used in women of any age who have unclear Pap test results. After the age of 30, women should have HPV testing at the same frequency as a Pap test.   Colorectal cancer can be detected and often prevented. Most routine colorectal cancer screening begins at the age of 50 and continues through age 75. However, your caregiver may recommend screening at an earlier age if you have risk factors for colon cancer. On a yearly basis, your caregiver may provide home test kits to check for hidden blood in the stool. Use of a small camera at the end of a tube, to directly examine the colon (sigmoidoscopy or colonoscopy), can detect the earliest forms of colorectal cancer. Talk to your caregiver about this at age 50, when routine screening begins. Direct examination of the colon should be repeated every 5 to 10 years through age 75, unless early forms of pre-cancerous polyps or small growths are found.   Hepatitis C blood testing is recommended for all people born from 1945 through 1965 and any individual with known risks for hepatitis C.    Practice safe sex. Use condoms and avoid high-risk sexual practices to reduce the spread of sexually transmitted infections (STIs). STIs include gonorrhea, chlamydia, syphilis, trichomonas, herpes, HPV, and human immunodeficiency virus (HIV). Herpes, HIV, and HPV are viral illnesses that have no cure. They can result in disability, cancer, and death. Sexually active women aged 25 and younger should be checked for chlamydia. Older women with new or multiple partners should also be tested for chlamydia. Testing for other STIs is recommended if you are sexually active and at increased risk.   Osteoporosis is a disease in which the bones lose minerals and strength with aging. This can result in serious bone fractures. The risk of osteoporosis can be identified using a bone density scan. Women ages 65 and over and women at risk for fractures or osteoporosis should discuss screening with their caregivers. Ask your caregiver whether you should take a calcium supplement or vitamin D to reduce the rate of osteoporosis.   Menopause can be associated with physical symptoms and risks. Hormone replacement therapy is available to decrease symptoms and risks. You should talk to your caregiver about whether hormone replacement therapy is right for you.   Use sunscreen with sun protection factor (SPF) of 30 or more. Apply sunscreen liberally and repeatedly throughout the day. You should seek shade when your shadow is shorter than you. Protect yourself by wearing long sleeves, pants, a wide-brimmed hat, and sunglasses year round, whenever you are outdoors.   Once a month, do a whole body skin exam, using a mirror to look at the skin on your back. Notify your caregiver of new moles, moles that have irregular borders, moles that are larger than a pencil eraser, or moles that have changed in shape or color.   Stay current with required immunizations.   Influenza. You need a dose every fall (or winter). The composition of  the flu vaccine changes each year, so being vaccinated once is not enough.   Pneumococcal polysaccharide. You need 1 to 2 doses if you smoke cigarettes or if you have certain chronic medical conditions. You need 1 dose at age 65 (or older) if you have never been vaccinated.   Tetanus, diphtheria, pertussis (Tdap, Td). Get 1 dose of   Tdap vaccine if you are younger than age 65, are over 65 and have contact with an infant, are a healthcare worker, are pregnant, or simply want to be protected from whooping cough. After that, you need a Td booster dose every 10 years. Consult your caregiver if you have not had at least 3 tetanus and diphtheria-containing shots sometime in your life or have a deep or dirty wound.   HPV. You need this vaccine if you are a woman age 26 or younger. The vaccine is given in 3 doses over 6 months.   Measles, mumps, rubella (MMR). You need at least 1 dose of MMR if you were born in 1957 or later. You may also need a second dose.   Meningococcal. If you are age 19 to 21 and a first-year college student living in a residence hall, or have one of several medical conditions, you need to get vaccinated against meningococcal disease. You may also need additional booster doses.   Zoster (shingles). If you are age 60 or older, you should get this vaccine.   Varicella (chickenpox). If you have never had chickenpox or you were vaccinated but received only 1 dose, talk to your caregiver to find out if you need this vaccine.   Hepatitis A. You need this vaccine if you have a specific risk factor for hepatitis A virus infection or you simply wish to be protected from this disease. The vaccine is usually given as 2 doses, 6 to 18 months apart.   Hepatitis B. You need this vaccine if you have a specific risk factor for hepatitis B virus infection or you simply wish to be protected from this disease. The vaccine is given in 3 doses, usually over 6 months.  Preventive Services /  Frequency Ages 19 to 39  Blood pressure check.** / Every 1 to 2 years.   Lipid and cholesterol check.** / Every 5 years beginning at age 20.   Clinical breast exam.** / Every 3 years for women in their 20s and 30s.   Pap test.** / Every 2 years from ages 21 through 29. Every 3 years starting at age 30 through age 65 or 70 with a history of 3 consecutive normal Pap tests.   HPV screening.** / Every 3 years from ages 30 through ages 65 to 70 with a history of 3 consecutive normal Pap tests.   Hepatitis C blood test.** / For any individual with known risks for hepatitis C.   Skin self-exam. / Monthly.   Influenza immunization.** / Every year.   Pneumococcal polysaccharide immunization.** / 1 to 2 doses if you smoke cigarettes or if you have certain chronic medical conditions.   Tetanus, diphtheria, pertussis (Tdap, Td) immunization. / A one-time dose of Tdap vaccine. After that, you need a Td booster dose every 10 years.   HPV immunization. / 3 doses over 6 months, if you are 26 and younger.   Measles, mumps, rubella (MMR) immunization. / You need at least 1 dose of MMR if you were born in 1957 or later. You may also need a second dose.   Meningococcal immunization. / 1 dose if you are age 19 to 21 and a first-year college student living in a residence hall, or have one of several medical conditions, you need to get vaccinated against meningococcal disease. You may also need additional booster doses.   Varicella immunization.** / Consult your caregiver.   Hepatitis A immunization.** / Consult your caregiver. 2 doses, 6 to 18 months   apart.   Hepatitis B immunization.** / Consult your caregiver. 3 doses usually over 6 months.  Ages 40 to 64  Blood pressure check.** / Every 1 to 2 years.   Lipid and cholesterol check.** / Every 5 years beginning at age 20.   Clinical breast exam.** / Every year after age 40.   Mammogram.** / Every year beginning at age 40 and continuing for as  long as you are in good health. Consult with your caregiver.   Pap test.** / Every 3 years starting at age 30 through age 65 or 70 with a history of 3 consecutive normal Pap tests.   HPV screening.** / Every 3 years from ages 30 through ages 65 to 70 with a history of 3 consecutive normal Pap tests.   Fecal occult blood test (FOBT) of stool. / Every year beginning at age 50 and continuing until age 75. You may not need to do this test if you get a colonoscopy every 10 years.   Flexible sigmoidoscopy or colonoscopy.** / Every 5 years for a flexible sigmoidoscopy or every 10 years for a colonoscopy beginning at age 50 and continuing until age 75.   Hepatitis C blood test.** / For all people born from 1945 through 1965 and any individual with known risks for hepatitis C.   Skin self-exam. / Monthly.   Influenza immunization.** / Every year.   Pneumococcal polysaccharide immunization.** / 1 to 2 doses if you smoke cigarettes or if you have certain chronic medical conditions.   Tetanus, diphtheria, pertussis (Tdap, Td) immunization.** / A one-time dose of Tdap vaccine. After that, you need a Td booster dose every 10 years.   Measles, mumps, rubella (MMR) immunization. / You need at least 1 dose of MMR if you were born in 1957 or later. You may also need a second dose.   Varicella immunization.** / Consult your caregiver.   Meningococcal immunization.** / Consult your caregiver.   Hepatitis A immunization.** / Consult your caregiver. 2 doses, 6 to 18 months apart.   Hepatitis B immunization.** / Consult your caregiver. 3 doses, usually over 6 months.  Ages 65 and over  Blood pressure check.** / Every 1 to 2 years.   Lipid and cholesterol check.** / Every 5 years beginning at age 20.   Clinical breast exam.** / Every year after age 40.   Mammogram.** / Every year beginning at age 40 and continuing for as long as you are in good health. Consult with your caregiver.   Pap test.** /  Every 3 years starting at age 30 through age 65 or 70 with a 3 consecutive normal Pap tests. Testing can be stopped between 65 and 70 with 3 consecutive normal Pap tests and no abnormal Pap or HPV tests in the past 10 years.   HPV screening.** / Every 3 years from ages 30 through ages 65 or 70 with a history of 3 consecutive normal Pap tests. Testing can be stopped between 65 and 70 with 3 consecutive normal Pap tests and no abnormal Pap or HPV tests in the past 10 years.   Fecal occult blood test (FOBT) of stool. / Every year beginning at age 50 and continuing until age 75. You may not need to do this test if you get a colonoscopy every 10 years.   Flexible sigmoidoscopy or colonoscopy.** / Every 5 years for a flexible sigmoidoscopy or every 10 years for a colonoscopy beginning at age 50 and continuing until age 75.   Hepatitis   C blood test.** / For all people born from 1945 through 1965 and any individual with known risks for hepatitis C.   Osteoporosis screening.** / A one-time screening for women ages 65 and over and women at risk for fractures or osteoporosis.   Skin self-exam. / Monthly.   Influenza immunization.** / Every year.   Pneumococcal polysaccharide immunization.** / 1 dose at age 65 (or older) if you have never been vaccinated.   Tetanus, diphtheria, pertussis (Tdap, Td) immunization. / A one-time dose of Tdap vaccine if you are over 65 and have contact with an infant, are a healthcare worker, or simply want to be protected from whooping cough. After that, you need a Td booster dose every 10 years.   Varicella immunization.** / Consult your caregiver.   Meningococcal immunization.** / Consult your caregiver.   Hepatitis A immunization.** / Consult your caregiver. 2 doses, 6 to 18 months apart.   Hepatitis B immunization.** / Check with your caregiver. 3 doses, usually over 6 months.  ** Family history and personal history of risk and conditions may change your caregiver's  recommendations. Document Released: 06/01/2001 Document Revised: 03/25/2011 Document Reviewed: 08/31/2010 [employer] Patient Information 2012 [employer]. 

## 2011-07-21 NOTE — Progress Notes (Signed)
  Subjective:     [name] is a 51 y.o. female and is here for a comprehensive physical exam. The patient reports no problems.  History   Social History  . Marital Status: Married    Spouse Name: N/A    Number of Children: N/A  . Years of Education: N/A   Occupational History  . Not on file.   Social History Main Topics  . Smoking status: Never Smoker   . Smokeless tobacco: Never Used  . Alcohol Use: No  . Drug Use: No  . Sexually Active: Yes -- Female partner(s)   Other Topics Concern  . Not on file   Social History Narrative   Regular exercise- joined [employer]--- plans to go 5 days a week   Health Maintenance  Topic Date Due  . Mammogram  01/04/2011  . Influenza Vaccine  01/18/2012  . Pap Smear  10/13/2013  . Tetanus/tdap  04/30/2018  . Colonoscopy  09/26/2018    The following portions of the patient's history were reviewed and updated as appropriate: allergies, current medications, past family history, past medical history, past social history, past surgical history and problem list.  Review of Systems Review of Systems  Constitutional: Negative for activity change, appetite change and fatigue.  HENT: Negative for hearing loss, congestion, tinnitus and ear discharge.  dentist-- due Eyes: Negative for visual disturbance (see optho q1y -- vision corrected to 20/20 with glasses).  Respiratory: Negative for cough, chest tightness and shortness of breath.   Cardiovascular: Negative for chest pain, palpitations and leg swelling.  Gastrointestinal: Negative for abdominal pain, diarrhea, constipation and abdominal distention.  Genitourinary: Negative for urgency, frequency, decreased urine volume and difficulty urinating.  Musculoskeletal: Negative for back pain, arthralgias and gait problem.  Skin: Negative for color change, pallor and rash.  Neurological: Negative for dizziness, light-headedness, numbness and headaches.  Hematological: Negative for adenopathy. Does not  bruise/bleed easily.  Psychiatric/Behavioral: Negative for suicidal ideas, confusion, sleep disturbance, self-injury, dysphoric mood, decreased concentration and agitation.       Objective:    BP 128/90  Pulse 74  Temp(Src) 98 F (36.7 C) (Oral)  Ht 5\' 9"  (1.753 m)  Wt 296 lb 9.6 oz (134.537 kg)  BMI 43.80 kg/m2  SpO2 96% General appearance: alert, cooperative, appears stated age and no distress Head: Normocephalic, without obvious abnormality, atraumatic Eyes: conjunctivae/corneas clear. PERRL, EOM's intact. Fundi benign. Ears: normal TM's and external ear canals both ears Nose: Nares normal. Septum midline. Mucosa normal. No drainage or sinus tenderness. Throat: lips, mucosa, and tongue normal; teeth and gums normal Neck: no adenopathy, no carotid bruit, no JVD, supple, symmetrical, trachea midline and thyroid not enlarged, symmetric, no tenderness/mass/nodules Back: symmetric, no curvature. ROM normal. No CVA tenderness. Lungs: clear to auscultation bilaterally Breasts: gyn Heart: regular rate and rhythm, S1, S2 normal, no murmur, click, rub or gallop Abdomen: soft, non-tender; bowel sounds normal; no masses,  no organomegaly Pelvic: gyn Extremities: extremities normal, atraumatic, no cyanosis or edema Pulses: 2+ and symmetric Skin: Skin color, texture, turgor normal. No rashes or lesions Lymph nodes: Cervical, supraclavicular, and axillary nodes normal. Neurologic: Alert and oriented X 3, normal strength and tone. Normal symmetric reflexes. Normal coordination and gait Psych--- no depression / anxiety Assessment:    Healthy female exam.      Plan:     See After Visit Summary for Counseling Recommendations

## 2011-07-23 LAB — URINE CULTURE

## 2011-07-30 ENCOUNTER — Telehealth: Payer: Self-pay

## 2011-07-30 MED ORDER — FLUCONAZOLE 150 MG PO TABS: ORAL_TABLET | ORAL | Status: DC

## 2011-07-30 NOTE — Telephone Encounter (Signed)
Rx faxed.    KP 

## 2011-07-30 NOTE — Telephone Encounter (Signed)
Discussed with patient and she stated she did not have any urine symptoms but the ABX that she was given gave her a yeast infection. She wanted to know if she could get an Rx for Diflucan sent to the pharmacy. Please advise    KP

## 2011-07-30 NOTE — Telephone Encounter (Signed)
Ok for Diflucan 150mg 

## 2011-07-30 NOTE — Telephone Encounter (Signed)
Message copied by Arnette Norris on Fri Jul 30, 2011 10:25 AM ------      Message from: Lelon Perla      Created: Fri Jul 23, 2011  9:41 AM       Contaminated culture.  If symptomatic ---recheck

## 2011-07-31 ENCOUNTER — Ambulatory Visit (INDEPENDENT_AMBULATORY_CARE_PROVIDER_SITE_OTHER): Payer: Managed Care, Other (non HMO) | Admitting: Family Medicine

## 2011-07-31 VITALS — BP 134/81 | HR 85 | Temp 98.1°F | Resp 18 | Ht 69.0 in | Wt 296.0 lb

## 2011-07-31 DIAGNOSIS — H60399 Other infective otitis externa, unspecified ear: Secondary | ICD-10-CM

## 2011-07-31 DIAGNOSIS — H6000 Abscess of external ear, unspecified ear: Secondary | ICD-10-CM

## 2011-07-31 MED ORDER — HYDROCODONE-ACETAMINOPHEN 5-500 MG PO TABS: 1.0000 | ORAL_TABLET | Freq: Three times a day (TID) | ORAL | Status: AC | PRN

## 2011-07-31 MED ORDER — DOXYCYCLINE HYCLATE 100 MG PO TABS: 100.0000 mg | ORAL_TABLET | Freq: Two times a day (BID) | ORAL | Status: AC

## 2011-07-31 NOTE — Progress Notes (Signed)
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  Worse pain with pressure to left ear.  Pain as bad as 8/10 and radiates to cheek.    No allergies to medicine, no fever, no trouble swallowing or dental pain  O: swollen post auricular skin with obvious abscess and small pustule in center of postauricular swelling Oroph:  Clear Neck:  Tender left side with left ant cervical adenopathy.   Left ear mildly swollen, although TM and canal are normal  A: postauricular abscess with cellulitis, worsening  P:  Doxycycline 100 mg bid Vicodin 5/500 q6h prn

## 2011-07-31 NOTE — Patient Instructions (Signed)

## 2011-07-31 NOTE — Progress Notes (Signed)
  Subjective:    Patient ID: [name], female    DOB: [birth year], 51 y.o.   MRN: [id number]  HPI    Review of Systems     Objective:   Physical Exam  L ear- betadine prep-0.5cc plain 1% lidocaine locally. #11 blade to make 5mm incision. +purulent drainage. Wick placed.      Assessment & Plan:

## 2011-08-02 ENCOUNTER — Ambulatory Visit (INDEPENDENT_AMBULATORY_CARE_PROVIDER_SITE_OTHER): Payer: Managed Care, Other (non HMO) | Admitting: Family Medicine

## 2011-08-02 VITALS — BP 121/85 | HR 90 | Temp 97.6°F | Resp 16 | Ht 68.5 in | Wt 293.0 lb

## 2011-08-02 DIAGNOSIS — H60399 Other infective otitis externa, unspecified ear: Secondary | ICD-10-CM

## 2011-08-02 DIAGNOSIS — H6000 Abscess of external ear, unspecified ear: Secondary | ICD-10-CM

## 2011-08-02 LAB — POCT CBC
Granulocyte percent: 54.5 %G (ref 37–80)
HCT, POC: 35.8 % — AB (ref 37.7–47.9)
Hemoglobin: 11.3 g/dL — AB (ref 12.2–16.2)
Lymph, poc: 1.9 (ref 0.6–3.4)
MCHC: 31.6 g/dL — AB (ref 31.8–35.4)
MCV: 85.8 fL (ref 80–97)
POC Granulocyte: 2.7 (ref 2–6.9)

## 2011-08-02 NOTE — Patient Instructions (Signed)
Same care. If not much better by Wednesday or Thursday return for one more recheck.

## 2011-08-02 NOTE — Progress Notes (Signed)
Subjective: Patient is here for recheck with regard to her ear lobe. She was seen 2 days ago and an I&D was done. He continues to be swollen her, but is not as painful as it was.  Objective the ear lobe itself looks okay. There is an area of induration just below the ear. Dr. Elbert Ewings was walking by and I have checked also. It is better than it was the other day.  Abscess left earlobe and neck  Plan Same treatment. Check CBC.  Results for orders placed in visit on 08/02/11  POCT CBC      Component Value Range   WBC 5.0  4.6 - 10.2 (K/uL)   Lymph, poc 1.9  0.6 - 3.4    POC LYMPH PERCENT 37.4  10 - 50 (%L)   MID (cbc) 0.4  0 - 0.9    POC MID % 8.1  0 - 12 (%M)   POC Granulocyte 2.7  2 - 6.9    Granulocyte percent 54.5  37 - 80 (%G)   RBC 4.17  4.04 - 5.48 (M/uL)   Hemoglobin 11.3 (*) 12.2 - 16.2 (g/dL)   HCT, POC 95.6 (*) 21.3 - 47.9 (%)   MCV 85.8  80 - 97 (fL)   MCH, POC 27.1  27 - 31.2 (pg)   MCHC 31.6 (*) 31.8 - 35.4 (g/dL)   RDW, POC 08.6     Platelet Count, POC 265  142 - 424 (K/uL)   MPV 8.9  0 - 99.8 (fL)

## 2011-08-06 ENCOUNTER — Other Ambulatory Visit: Payer: Self-pay | Admitting: Obstetrics & Gynecology

## 2011-08-06 DIAGNOSIS — R928 Other abnormal and inconclusive findings on diagnostic imaging of breast: Secondary | ICD-10-CM

## 2011-08-09 ENCOUNTER — Ambulatory Visit
Admission: RE | Admit: 2011-08-09 | Discharge: 2011-08-09 | Disposition: A | Discharge status: Home / Self Care | Payer: Managed Care, Other (non HMO) | Source: Ambulatory Visit | Attending: Obstetrics & Gynecology | Admitting: Obstetrics & Gynecology

## 2011-08-09 DIAGNOSIS — R928 Other abnormal and inconclusive findings on diagnostic imaging of breast: Secondary | ICD-10-CM

## 2011-08-21 ENCOUNTER — Other Ambulatory Visit: Payer: Self-pay | Admitting: Family Medicine

## 2011-09-20 ENCOUNTER — Encounter: Payer: Self-pay | Admitting: Family Medicine

## 2011-09-20 ENCOUNTER — Ambulatory Visit (INDEPENDENT_AMBULATORY_CARE_PROVIDER_SITE_OTHER): Payer: Managed Care, Other (non HMO) | Admitting: Family Medicine

## 2011-09-20 VITALS — BP 118/80 | HR 76 | Temp 98.8°F | Wt 298.8 lb

## 2011-09-20 DIAGNOSIS — R12 Heartburn: Secondary | ICD-10-CM

## 2011-09-20 DIAGNOSIS — K219 Gastro-esophageal reflux disease without esophagitis: Secondary | ICD-10-CM

## 2011-09-20 LAB — BASIC METABOLIC PANEL
BUN: 12 mg/dL (ref 6–23)
GFR: 92.03 mL/min (ref >60.00)
Potassium: 3.4 mEq/L — ABNORMAL LOW (ref 3.5–5.1)
Sodium: 141 mEq/L (ref 135–145)

## 2011-09-20 LAB — CBC WITH DIFFERENTIAL/PLATELET
Eosinophils Relative: 4.4 % (ref 0.0–5.0)
HCT: 33.6 % — ABNORMAL LOW (ref 36.0–46.0)
Lymphs Abs: 1.5 10*3/uL (ref 0.7–4.0)
Monocytes Relative: 7.6 % (ref 3.0–12.0)
Platelets: 257 10*3/uL (ref 150.0–400.0)
WBC: 5.2 10*3/uL (ref 4.5–10.5)

## 2011-09-20 LAB — H. PYLORI ANTIBODY, IGG: H Pylori IgG: NEGATIVE

## 2011-09-20 LAB — HEPATIC FUNCTION PANEL
Bilirubin, Direct: 0.1 mg/dL (ref 0.0–0.3)
Total Bilirubin: 0.5 mg/dL (ref 0.3–1.2)

## 2011-09-20 MED ORDER — OMEPRAZOLE 40 MG PO CPDR: 40.0000 mg | DELAYED_RELEASE_CAPSULE | Freq: Every day | ORAL | Status: DC

## 2011-09-20 MED ORDER — GI COCKTAIL ~~LOC~~
30.0000 mL | Freq: Once | ORAL | Status: AC
  Administered 2011-09-20: 30 mL via ORAL

## 2011-09-20 NOTE — Assessment & Plan Note (Signed)
Omeprazole Check labs F/u GI

## 2011-09-20 NOTE — Progress Notes (Signed)
  Subjective:    Patient ID: [name], female    DOB: 04/05/1961, 51 y.o.   MRN: [id number]  HPI  Pt here c/o feeling like a piece of grapefruit from snack cup got stuck in her throat Thursday.   Friday she took a Nexium because the feeling did not go away.  Now she has a buringing sensation in her chest and is burping a lot.    Review of Systems As above    Objective:   Physical Exam  Constitutional: She is oriented to person, place, and time. She appears well-developed and well-nourished.  Pulmonary/Chest: Effort normal and breath sounds normal. No respiratory distress. She has no wheezes. She has no rales. She exhibits no tenderness.  Abdominal: Soft. Bowel sounds are normal. She exhibits no distension. There is no tenderness. There is no rebound and no guarding.  Neurological: She is alert and oriented to person, place, and time.  Psychiatric: She has a normal mood and affect. Her behavior is normal.          Assessment & Plan:   GERD----  Symptoms improve with GI cocktail                   Take omeprazole qd                     F/u GI

## 2011-09-20 NOTE — Patient Instructions (Signed)
Diet for GERD or PUD Nutrition therapy can help ease the discomfort of gastroesophageal reflux disease (GERD) and peptic ulcer disease (PUD).  HOME CARE INSTRUCTIONS   Eat your meals slowly, in a relaxed setting.   Eat 5 to 6 small meals per day.   If a food causes distress, stop eating it for a period of time.  FOODS TO AVOID  Coffee, regular or decaffeinated.   Cola beverages, regular or low calorie.   Tea, regular or decaffeinated.   Pepper.   Cocoa.   High fat foods, including meats.   Butter, margarine, hydrogenated oil (trans fats).   Peppermint or spearmint (if you have GERD).   Fruits and vegetables if not tolerated.   Alcohol.   Nicotine (smoking or chewing). This is one of the most potent stimulants to acid production in the gastrointestinal tract.   Any food that seems to aggravate your condition.  If you have questions regarding your diet, ask your caregiver or a registered dietitian. TIPS  Lying flat may make symptoms worse. Keep the head of your bed raised 6 to 9 inches (15 to 23 cm) by using a foam wedge or blocks under the legs of the bed.   Do not lay down until 3 hours after eating a meal.   Daily physical activity may help reduce symptoms.  MAKE SURE YOU:   Understand these instructions.   Will watch your condition.   Will get help right away if you are not doing well or get worse.  Document Released: 04/05/2005 Document Revised: 03/25/2011 Document Reviewed: 02/19/2011 Exit[employer] Patient Information 2012 [employer]. 

## 2011-09-24 ENCOUNTER — Encounter: Payer: Self-pay | Admitting: *Deleted

## 2011-10-01 ENCOUNTER — Encounter: Payer: Self-pay | Admitting: Internal Medicine

## 2011-10-01 ENCOUNTER — Ambulatory Visit (INDEPENDENT_AMBULATORY_CARE_PROVIDER_SITE_OTHER): Payer: Managed Care, Other (non HMO) | Admitting: Internal Medicine

## 2011-10-01 VITALS — BP 110/64 | HR 88 | Ht 68.0 in | Wt 292.8 lb

## 2011-10-01 DIAGNOSIS — R1319 Other dysphagia: Secondary | ICD-10-CM

## 2011-10-01 DIAGNOSIS — K219 Gastro-esophageal reflux disease without esophagitis: Secondary | ICD-10-CM

## 2011-10-01 NOTE — Progress Notes (Signed)
[name] [birth year] MRN [id number]   History of Present Illness:  This is a 51 year old African American female with a history of gastroesophageal reflux evaluated with an upper endoscopy in July 2011. The exam was normal and she responded to Nexium and Carafate. She had a colonoscopy in June 2010 for left lower quadrant abdominal pain and was found to have moderately severe diverticulosis. She is here today because of  an episode of choking on food one week ago. It was pieces of grapefruit. It hesitated and then it felt painful as the bolus of food passed down... She is  still experiencing soreness substernally every time she eats, but it is about 75% improved. She denies having dysphagia . She used to be on Nexium 40 mg daily but stopped taking it. She uses it only on occasion, less than once a week. An upper abdominal ultrasound in July 2011 was normal.   Past Medical History  Diagnosis Date  . Hypertension   . Post-menopausal   . Left shoulder pain   . Diverticulosis of colon (without mention of hemorrhage)   . Abdominal  pain, other specified site   . Hypokalemia   . Benign neoplasm of skin, site unspecified   . Family history of ischemic heart disease   . Family history of malignant neoplasm of genital organ, other   . Disturbance of skin sensation   . Sprain and strain of unspecified site of knee and leg   . Sprain of neck   . Acute upper respiratory infections of unspecified site   . Family history of diabetes mellitus   . Unspecified essential hypertension   . GERD (gastroesophageal reflux disease)    Past Surgical History  Procedure Date  . Total abdominal hysterectomy w/ bilateral salpingoophorectomy 2002  . Bunionectomy 2005    right foot  . Shoulder surgery     left;spurs 2-09- dr Rolene Arbour  . Abdominal hysterectomy     reports that she has never smoked. She has never used smokeless tobacco. She reports that she does not drink alcohol or use illicit drugs. family  history includes Dementia in her mother; Diabetes in her brother and mother; Hypertension in her father, mother, and sister; Prostate cancer in her father; and Stroke in her maternal aunt and mother.  There is no history of Colon cancer and Colon cancer. No Known Allergies      Review of Systems: Residual dysphagia odynophagia. No nausea vomiting  The remainder of the 10 point ROS is negative except as outlined in H&P   Physical Exam: General appearance  Well developed, in no distress. Eyes- non icteric. HEENT nontraumatic, normocephalic. Mouth no lesions, tongue papillated, no cheilosis. Neck supple without adenopathy, thyroid not enlarged, no carotid bruits, no JVD. Lungs Clear to auscultation bilaterally. Cor normal S1, normal S2, regular rhythm, no murmur,  quiet precordium. Abdomen: Soft nontender with normoactive bowel sounds. Rectal: Not done. Extremities no pedal edema. Skin no lesions. Neurological alert and oriented x 3. Psychological normal mood and affect.  Assessment and Plan:  Problem #1 History of gastroesophageal reflux and acute episode of odynophagia precipitated by food bolus . We need to rule out esophageal dysmotility or esophageal ulcer. Her symptoms have  improved on proton pump inhibitors. She will continue on Prilosec 20 mg daily and we will add Carafate slurry 10 cc by mouth twice a day for several days. If her symptoms don't improve or if they improve and then recuragain she ought to have an upper endoscopy  and biopsies.Candida esophagitis is a consideration. Problem #2 colorectal screening- she is up to date, last colon 09/2008  10/01/2011 Lina Sar

## 2011-10-01 NOTE — Patient Instructions (Addendum)
We have sent the following medications to your pharmacy for you to pick up at your convenience: Carafate CC: Dr Loreen Freud

## 2011-10-06 ENCOUNTER — Other Ambulatory Visit: Payer: Self-pay | Admitting: Internal Medicine

## 2011-10-06 MED ORDER — SUCRALFATE 1 GM/10ML PO SUSP: 1.0000 g | Freq: Two times a day (BID) | ORAL | Status: DC

## 2011-10-06 NOTE — Telephone Encounter (Signed)
rx sent

## 2011-10-20 ENCOUNTER — Telehealth: Payer: Self-pay | Admitting: Family Medicine

## 2011-10-20 DIAGNOSIS — E876 Hypokalemia: Secondary | ICD-10-CM

## 2011-10-20 MED ORDER — POTASSIUM CHLORIDE 20 MEQ PO PACK: 20.0000 meq | PACK | Freq: Two times a day (BID) | ORAL | Status: DC

## 2011-10-20 NOTE — Telephone Encounter (Signed)
Refill: Klor-con 20 meq packet #60. Use 1 packet by mouth twice daily. Last fill 09-20-11

## 2011-10-22 ENCOUNTER — Other Ambulatory Visit (INDEPENDENT_AMBULATORY_CARE_PROVIDER_SITE_OTHER): Payer: Managed Care, Other (non HMO)

## 2011-10-22 DIAGNOSIS — D649 Anemia, unspecified: Secondary | ICD-10-CM

## 2011-10-22 LAB — CBC WITH DIFFERENTIAL/PLATELET
Basophils Relative: 0.4 % (ref 0.0–3.0)
Eosinophils Absolute: 0.1 10*3/uL (ref 0.0–0.7)
Eosinophils Relative: 3.1 % (ref 0.0–5.0)
HCT: 39 % (ref 36.0–46.0)
Lymphs Abs: 1.5 10*3/uL (ref 0.7–4.0)
MCHC: 32.8 g/dL (ref 30.0–36.0)
MCV: 86.2 fl (ref 78.0–100.0)
Monocytes Absolute: 0.3 10*3/uL (ref 0.1–1.0)
Neutro Abs: 2.6 10*3/uL (ref 1.4–7.7)
RBC: 4.52 Mil/uL (ref 3.87–5.11)
WBC: 4.5 10*3/uL (ref 4.5–10.5)

## 2011-10-22 NOTE — Progress Notes (Signed)
Labs only

## 2012-01-20 ENCOUNTER — Ambulatory Visit (INDEPENDENT_AMBULATORY_CARE_PROVIDER_SITE_OTHER): Payer: Managed Care, Other (non HMO) | Admitting: Family Medicine

## 2012-01-20 ENCOUNTER — Encounter: Payer: Self-pay | Admitting: Family Medicine

## 2012-01-20 VITALS — BP 114/74 | HR 97 | Temp 98.4°F | Wt 286.2 lb

## 2012-01-20 DIAGNOSIS — D649 Anemia, unspecified: Secondary | ICD-10-CM

## 2012-01-20 DIAGNOSIS — E785 Hyperlipidemia, unspecified: Secondary | ICD-10-CM | POA: Insufficient documentation

## 2012-01-20 DIAGNOSIS — M1990 Unspecified osteoarthritis, unspecified site: Secondary | ICD-10-CM

## 2012-01-20 DIAGNOSIS — I10 Essential (primary) hypertension: Secondary | ICD-10-CM

## 2012-01-20 LAB — CBC WITH DIFFERENTIAL/PLATELET
Basophils Absolute: 0.1 10*3/uL (ref 0.0–0.1)
HCT: 38.8 % (ref 36.0–46.0)
Lymphocytes Relative: 32.1 % (ref 12.0–46.0)
Lymphs Abs: 1.6 10*3/uL (ref 0.7–4.0)
Monocytes Relative: 6.9 % (ref 3.0–12.0)
Neutrophils Relative %: 57.8 % (ref 43.0–77.0)
Platelets: 274 10*3/uL (ref 150.0–400.0)
RDW: 14.1 % (ref 11.5–14.6)

## 2012-01-20 LAB — BASIC METABOLIC PANEL
BUN: 20 mg/dL (ref 6–23)
Calcium: 8.6 mg/dL (ref 8.4–10.5)
Creatinine, Ser: 1 mg/dL (ref 0.4–1.2)
GFR: 73.46 mL/min (ref >60.00)
Glucose, Bld: 76 mg/dL (ref 70–99)

## 2012-01-20 LAB — HEPATIC FUNCTION PANEL
AST: 17 U/L (ref 0–37)
Bilirubin, Direct: 0.1 mg/dL (ref 0.0–0.3)
Total Bilirubin: 0.8 mg/dL (ref 0.3–1.2)

## 2012-01-20 LAB — LIPID PANEL
Cholesterol: 172 mg/dL (ref 0–200)
HDL: 52.8 mg/dL (ref >39.00)
Triglycerides: 75 mg/dL (ref 0.0–149.0)
VLDL: 15 mg/dL (ref 0.0–40.0)

## 2012-01-20 MED ORDER — HYDROCODONE-ACETAMINOPHEN 7.5-750 MG PO TABS: 1.0000 | ORAL_TABLET | Freq: Four times a day (QID) | ORAL | Status: DC | PRN

## 2012-01-20 NOTE — Assessment & Plan Note (Signed)
Check labs 

## 2012-01-20 NOTE — Progress Notes (Signed)
  Subjective:    Patient here for follow-up of elevated blood pressure, cholesterol and to have labs done.    She is exercising and is adherent to a low-salt diet.  Blood pressure is well controlled at home. Cardiac symptoms: none. Patient denies: chest pain, chest pressure/discomfort, claudication, dyspnea, exertional chest pressure/discomfort, fatigue, irregular heart beat, lower extremity edema, near-syncope, orthopnea, palpitations, paroxysmal nocturnal dyspnea, syncope and tachypnea. Cardiovascular risk factors: dyslipidemia, hypertension and obesity (BMI >= 30 kg/m2). Use of agents associated with hypertension: none. History of target organ damage: none.  The following portions of the patient's history were reviewed and updated as appropriate: allergies, current medications, past family history, past medical history, past social history, past surgical history and problem list.  Review of Systems Pertinent items are noted in HPI.     Objective:    BP 114/74  Pulse 97  Temp 98.4 F (36.9 C) (Oral)  Wt 286 lb 3.2 oz (129.819 kg)  SpO2 96% General appearance: alert, cooperative, appears stated age and no distress Lungs: clear to auscultation bilaterally Heart: S1, S2 normal Extremities: extremities normal, atraumatic, no cyanosis or edema    Assessment:    Hypertension, normal blood pressure . Evidence of target organ damage: none.   hyperlipidemia OA Plan:    Medication: no change. Dietary sodium restriction. Regular aerobic exercise. Check blood pressures 2-3 times weekly and record. Follow up: 6 months and as needed.

## 2012-01-20 NOTE — Assessment & Plan Note (Signed)
Controlled con't meds  

## 2012-01-20 NOTE — Assessment & Plan Note (Signed)
Refill pain meds 

## 2012-01-20 NOTE — Patient Instructions (Addendum)

## 2012-02-08 ENCOUNTER — Other Ambulatory Visit (INDEPENDENT_AMBULATORY_CARE_PROVIDER_SITE_OTHER): Payer: Managed Care, Other (non HMO)

## 2012-02-08 DIAGNOSIS — E876 Hypokalemia: Secondary | ICD-10-CM

## 2012-02-08 LAB — BASIC METABOLIC PANEL
CO2: 29 mEq/L (ref 19–32)
GFR: 78.77 mL/min (ref >60.00)
Glucose, Bld: 70 mg/dL (ref 70–99)
Potassium: 3.2 mEq/L — ABNORMAL LOW (ref 3.5–5.1)
Sodium: 139 mEq/L (ref 135–145)

## 2012-02-11 ENCOUNTER — Other Ambulatory Visit: Payer: Self-pay | Admitting: Family Medicine

## 2012-02-11 DIAGNOSIS — I10 Essential (primary) hypertension: Secondary | ICD-10-CM

## 2012-02-11 DIAGNOSIS — E876 Hypokalemia: Secondary | ICD-10-CM

## 2012-02-11 MED ORDER — AMLODIPINE BESYLATE 5 MG PO TABS: 5.0000 mg | ORAL_TABLET | Freq: Every day | ORAL | Status: DC

## 2012-02-11 MED ORDER — POTASSIUM CHLORIDE 20 MEQ PO PACK: 20.0000 meq | PACK | Freq: Two times a day (BID) | ORAL | Status: DC

## 2012-02-11 NOTE — Telephone Encounter (Signed)
refill AmLODIPine Besylate (Tab) 5 MG Take 1 tablet (5 mg total) by mouth daily. #30 wt/2-refills last fill 6.29.13--last ov 34-month f/u 10.3.13

## 2012-02-11 NOTE — Telephone Encounter (Signed)
refill KLOR-CON 20 MEQ Take 20 mEq by mouth 2 (two) times daily #60 wt/2-refills last fill 6.3.13-last ov 10.3.13 26-month follow up

## 2012-05-18 ENCOUNTER — Telehealth: Payer: Self-pay | Admitting: Family Medicine

## 2012-05-18 NOTE — Telephone Encounter (Signed)
Patient Information:  Caller Name: Tam  Phone: [phone]  Patient: [name], [name]  Gender: Female  DOB: 1961/04/11  Age: 52 Years  PCP: Lelon Perla.  Pregnant: No  Office Follow Up:  Does the office need to follow up with this patient?: Yes  Instructions For The Office: Patient is at work and can be at the office in 5 minutes.   Symptoms  Reason For Call & Symptoms: Patient calling, asking for an appt.  Has had a cold and cough since Monday 1/27 and had yellow sputum.  Has a fever at times but hasn't checked same with a thermometer.  Reviewed Health History In EMR: Yes  Reviewed Medications In EMR: Yes  Reviewed Allergies In EMR: Yes  Reviewed Surgeries / Procedures: Yes  Date of Onset of Symptoms: 05/15/2012  Treatments Tried: Aleve, Halls cough drops  Treatments Tried Worked: No OB / GYN:  LMP: Unknown  Guideline(s) Used:  Cough  Disposition Per Guideline:   See Today or Tomorrow in Office  Reason For Disposition Reached:   Patient wants to be seen  Advice Given:  N/A

## 2012-05-18 NOTE — Telephone Encounter (Signed)
msg left to call the office     KP 

## 2012-05-18 NOTE — Telephone Encounter (Signed)
Was appointment made?

## 2012-05-19 ENCOUNTER — Encounter: Payer: Self-pay | Admitting: Family Medicine

## 2012-05-19 ENCOUNTER — Ambulatory Visit (INDEPENDENT_AMBULATORY_CARE_PROVIDER_SITE_OTHER): Payer: Managed Care, Other (non HMO) | Admitting: Family Medicine

## 2012-05-19 VITALS — BP 130/88 | HR 110 | Temp 99.1°F | Ht 68.25 in | Wt 284.0 lb

## 2012-05-19 DIAGNOSIS — J111 Influenza due to unidentified influenza virus with other respiratory manifestations: Secondary | ICD-10-CM

## 2012-05-19 LAB — POCT INFLUENZA A/B: Influenza A, POC: POSITIVE

## 2012-05-19 MED ORDER — OSELTAMIVIR PHOSPHATE 75 MG PO CAPS: 75.0000 mg | ORAL_CAPSULE | Freq: Two times a day (BID) | ORAL | Status: DC

## 2012-05-19 MED ORDER — GUAIFENESIN-CODEINE 100-10 MG/5ML PO SYRP: 10.0000 mL | ORAL_SOLUTION | Freq: Three times a day (TID) | ORAL | Status: DC | PRN

## 2012-05-19 NOTE — Progress Notes (Signed)
  Subjective:    Patient ID: [name], female    DOB: 09-15-1960, 52 y.o.   MRN: [id number]  HPI URI- sxs started Monday.  + productive cough, facial pain/pressure, no HAs.  L ear pain on Wed.  Subjective fevers at night.  No N/V/D.  No known sick contacts.  + body aches.  No relief w/ OTC cough/cold meds.  Fever improves w/ ibuprofen   Review of Systems For ROS see HPI     Objective:   Physical Exam  Vitals reviewed. Constitutional: She appears well-developed and well-nourished. No distress.       Obviously not feeling well  HENT:  Head: Normocephalic and atraumatic.  Right Ear: Tympanic membrane normal.  Left Ear: Tympanic membrane normal.  Nose: Mucosal edema and rhinorrhea present. Right sinus exhibits no maxillary sinus tenderness and no frontal sinus tenderness. Left sinus exhibits no maxillary sinus tenderness and no frontal sinus tenderness.  Mouth/Throat: Uvula is midline and mucous membranes are normal. Posterior oropharyngeal erythema present. No oropharyngeal exudate.  Eyes: Conjunctivae normal and EOM are normal. Pupils are equal, round, and reactive to light.  Neck: Normal range of motion. Neck supple.  Cardiovascular: Normal rate, regular rhythm and normal heart sounds.   Pulmonary/Chest: Effort normal and breath sounds normal. No respiratory distress. She has no wheezes.       + dry cough  Lymphadenopathy:    She has no cervical adenopathy.  Skin: Skin is warm.          Assessment & Plan:

## 2012-05-19 NOTE — Telephone Encounter (Signed)
msg left to call the office     KP 

## 2012-05-19 NOTE — Patient Instructions (Addendum)
This is the flu Start the Tamiflu twice daily Use the cough syrup as needed Drink plenty of fluids REST! Hang in there!

## 2012-05-21 NOTE — Assessment & Plan Note (Signed)
New.  Rapid test +.  Start Tamiflu.  Reviewed supportive care and red flags that should prompt return.  Pt expressed understanding and is in agreement w/ plan.

## 2012-05-24 NOTE — Telephone Encounter (Signed)
Spoke with patient and she stated she was doing better.  Still having cough and she had been taking the cough medication that was prescribed at night only. I made her aware to try delsym during the day if need and if no relief to follow up     KP//CMA

## 2012-06-14 ENCOUNTER — Telehealth: Payer: Self-pay | Admitting: Family Medicine

## 2012-06-14 DIAGNOSIS — I10 Essential (primary) hypertension: Secondary | ICD-10-CM

## 2012-06-14 MED ORDER — LOSARTAN POTASSIUM-HCTZ 100-25 MG PO TABS: ORAL_TABLET | ORAL | Status: DC

## 2012-06-14 NOTE — Telephone Encounter (Signed)
refill Losartan HCTZ (Tab) 100-25 MG take 1 tablet every day #90 wt/3-refills last fill 1.29.14

## 2012-07-05 ENCOUNTER — Telehealth: Payer: Self-pay | Admitting: Family Medicine

## 2012-07-05 DIAGNOSIS — I10 Essential (primary) hypertension: Secondary | ICD-10-CM

## 2012-07-05 DIAGNOSIS — M1990 Unspecified osteoarthritis, unspecified site: Secondary | ICD-10-CM

## 2012-07-05 DIAGNOSIS — J329 Chronic sinusitis, unspecified: Secondary | ICD-10-CM

## 2012-07-05 MED ORDER — AMLODIPINE BESYLATE 5 MG PO TABS: 5.0000 mg | ORAL_TABLET | Freq: Every day | ORAL | Status: DC

## 2012-07-05 MED ORDER — HYDROCODONE-ACETAMINOPHEN 7.5-750 MG PO TABS: 1.0000 | ORAL_TABLET | Freq: Four times a day (QID) | ORAL | Status: DC | PRN

## 2012-07-05 MED ORDER — LOSARTAN POTASSIUM-HCTZ 100-25 MG PO TABS: ORAL_TABLET | ORAL | Status: DC

## 2012-07-05 MED ORDER — FLUTICASONE PROPIONATE 50 MCG/ACT NA SUSP: 2.0000 | Freq: Every day | NASAL | Status: DC

## 2012-07-05 MED ORDER — OMEPRAZOLE 40 MG PO CPDR: 40.0000 mg | DELAYED_RELEASE_CAPSULE | Freq: Every day | ORAL | Status: DC

## 2012-07-05 NOTE — Telephone Encounter (Signed)
Pt aware Rx ready for pick up 

## 2012-07-05 NOTE — Telephone Encounter (Signed)
Patient called requesting written prescriptions of flonase, her 2 bp meds, her indigestion pill, and hydrocodone. Call mobile # when ready for pick up.

## 2012-07-05 NOTE — Telephone Encounter (Signed)
Refill pain med x1 all else for 3 months

## 2012-07-05 NOTE — Telephone Encounter (Signed)
01-20-12 Last OV, Last filled #30

## 2012-07-11 MED ORDER — HYDROCODONE-ACETAMINOPHEN 7.5-325 MG PO TABS: 1.0000 | ORAL_TABLET | Freq: Four times a day (QID) | ORAL | Status: DC | PRN

## 2012-07-11 NOTE — Addendum Note (Signed)
Addended by: Candie Echevaria L on: 07/11/2012 01:48 PM   Modules accepted: Orders

## 2012-07-11 NOTE — Telephone Encounter (Signed)
Rx changed due to current strength no longer available.

## 2012-07-19 ENCOUNTER — Encounter: Payer: Self-pay | Admitting: Lab

## 2012-07-20 ENCOUNTER — Ambulatory Visit: Payer: Managed Care, Other (non HMO) | Admitting: Family Medicine

## 2012-07-21 ENCOUNTER — Encounter: Payer: Self-pay | Admitting: Lab

## 2012-07-24 ENCOUNTER — Encounter: Payer: Self-pay | Admitting: Family Medicine

## 2012-07-24 ENCOUNTER — Ambulatory Visit (INDEPENDENT_AMBULATORY_CARE_PROVIDER_SITE_OTHER): Payer: Managed Care, Other (non HMO) | Admitting: Family Medicine

## 2012-07-24 VITALS — BP 114/72 | HR 73 | Temp 98.2°F | Wt 290.6 lb

## 2012-07-24 DIAGNOSIS — I10 Essential (primary) hypertension: Secondary | ICD-10-CM

## 2012-07-24 DIAGNOSIS — E876 Hypokalemia: Secondary | ICD-10-CM

## 2012-07-24 DIAGNOSIS — Z7251 High risk heterosexual behavior: Secondary | ICD-10-CM

## 2012-07-24 MED ORDER — POTASSIUM CHLORIDE ER 10 MEQ PO TBCR: 20.0000 meq | EXTENDED_RELEASE_TABLET | Freq: Two times a day (BID) | ORAL | Status: DC

## 2012-07-24 NOTE — Progress Notes (Signed)
  Subjective:    Patient here for follow-up of elevated blood pressure.  She is not exercising and is adherent to a low-salt diet.  Blood pressure is well controlled at home. Cardiac symptoms: none. Patient denies: chest pain, chest pressure/discomfort, claudication, dyspnea, exertional chest pressure/discomfort, fatigue and irregular heart beat. Cardiovascular risk factors: hypertension, obesity (BMI >= 30 kg/m2) and sedentary lifestyle. Use of agents associated with hypertension: none. History of target organ damage: none.  The following portions of the patient's history were reviewed and updated as appropriate: allergies, current medications, past family history, past medical history, past social history, past surgical history and problem list.  Review of Systems Pertinent items are noted in HPI.     Objective:    BP 114/72  Pulse 73  Temp(Src) 98.2 F (36.8 C) (Oral)  Wt 290 lb 9.6 oz (131.815 kg)  BMI 43.84 kg/m2  SpO2 96% General appearance: alert, cooperative, appears stated age and no distress Neck: no adenopathy, supple, symmetrical, trachea midline and thyroid not enlarged, symmetric, no tenderness/mass/nodules Lungs: clear to auscultation bilaterally Heart: S1, S2 normal Extremities: extremities normal, atraumatic, no cyanosis or edema    Assessment:    Hypertension, normal blood pressure . Evidence of target organ damage: none.    Plan:    Medication: no change. Dietary sodium restriction. Regular aerobic exercise. Check blood pressures 2-3 times weekly and record. Follow up: 6 months and as needed.

## 2012-07-24 NOTE — Patient Instructions (Addendum)

## 2012-07-25 LAB — LIPID PANEL
Cholesterol: 151 mg/dL (ref 0–200)
LDL Cholesterol: 84 mg/dL (ref 0–99)
Total CHOL/HDL Ratio: 3
VLDL: 17 mg/dL (ref 0.0–40.0)

## 2012-07-25 LAB — POCT URINALYSIS DIPSTICK
Blood, UA: NEGATIVE
Glucose, UA: NEGATIVE
Nitrite, UA: NEGATIVE
Urobilinogen, UA: 0.2
pH, UA: 6

## 2012-07-25 LAB — HEPATIC FUNCTION PANEL
AST: 18 U/L (ref 0–37)
Alkaline Phosphatase: 57 U/L (ref 39–117)
Bilirubin, Direct: 0.1 mg/dL (ref 0.0–0.3)
Total Bilirubin: 0.7 mg/dL (ref 0.3–1.2)

## 2012-07-25 LAB — BASIC METABOLIC PANEL
Calcium: 8.6 mg/dL (ref 8.4–10.5)
GFR: 77.69 mL/min (ref >60.00)
Glucose, Bld: 85 mg/dL (ref 70–99)
Sodium: 138 mEq/L (ref 135–145)

## 2012-08-14 ENCOUNTER — Other Ambulatory Visit: Payer: Self-pay | Admitting: Family Medicine

## 2012-08-17 ENCOUNTER — Encounter: Payer: Self-pay | Admitting: Family Medicine

## 2012-10-23 ENCOUNTER — Other Ambulatory Visit: Payer: Self-pay | Admitting: *Deleted

## 2012-10-23 DIAGNOSIS — J329 Chronic sinusitis, unspecified: Secondary | ICD-10-CM

## 2012-10-23 MED ORDER — FLUTICASONE PROPIONATE 50 MCG/ACT NA SUSP: 2.0000 | Freq: Every day | NASAL | Status: DC

## 2012-10-23 NOTE — Addendum Note (Signed)
Addended by: Arnette Norris on: 10/23/2012 03:46 PM   Modules accepted: Orders

## 2012-10-23 NOTE — Telephone Encounter (Signed)
Refill for fluticasone [facility] to [facility]

## 2012-10-23 NOTE — Telephone Encounter (Signed)
Rx re-sent because previous Rx printed      KP

## 2012-11-22 ENCOUNTER — Other Ambulatory Visit: Payer: Self-pay | Admitting: *Deleted

## 2012-11-22 DIAGNOSIS — I10 Essential (primary) hypertension: Secondary | ICD-10-CM

## 2012-11-22 MED ORDER — LOSARTAN POTASSIUM-HCTZ 100-25 MG PO TABS: ORAL_TABLET | ORAL | Status: DC

## 2012-11-22 MED ORDER — OMEPRAZOLE 40 MG PO CPDR: 40.0000 mg | DELAYED_RELEASE_CAPSULE | Freq: Every day | ORAL | Status: DC

## 2012-11-22 NOTE — Telephone Encounter (Signed)
Rx was filled for losartan/hctz 100/25 mg. AG cma

## 2012-11-22 NOTE — Telephone Encounter (Signed)
Rx was refilled for Omeprazole dr 40 mg 11/22/2012.  AG cma

## 2012-11-24 ENCOUNTER — Other Ambulatory Visit: Payer: Self-pay | Admitting: *Deleted

## 2012-11-24 DIAGNOSIS — I10 Essential (primary) hypertension: Secondary | ICD-10-CM

## 2012-11-24 MED ORDER — OMEPRAZOLE 40 MG PO CPDR: 40.0000 mg | DELAYED_RELEASE_CAPSULE | Freq: Every day | ORAL | Status: DC

## 2012-11-24 MED ORDER — LOSARTAN POTASSIUM-HCTZ 100-25 MG PO TABS: ORAL_TABLET | ORAL | Status: DC

## 2012-11-25 ENCOUNTER — Ambulatory Visit: Payer: Managed Care, Other (non HMO)

## 2012-11-25 ENCOUNTER — Ambulatory Visit (INDEPENDENT_AMBULATORY_CARE_PROVIDER_SITE_OTHER): Payer: Managed Care, Other (non HMO) | Admitting: Emergency Medicine

## 2012-11-25 VITALS — BP 124/70 | HR 84 | Temp 97.8°F | Resp 16 | Ht 68.5 in | Wt 285.0 lb

## 2012-11-25 DIAGNOSIS — M2390 Unspecified internal derangement of unspecified knee: Secondary | ICD-10-CM

## 2012-11-25 DIAGNOSIS — M2391 Unspecified internal derangement of right knee: Secondary | ICD-10-CM

## 2012-11-25 DIAGNOSIS — M25569 Pain in unspecified knee: Secondary | ICD-10-CM

## 2012-11-25 DIAGNOSIS — M25561 Pain in right knee: Secondary | ICD-10-CM

## 2012-11-25 MED ORDER — HYDROCODONE-ACETAMINOPHEN 7.5-325 MG PO TABS: 1.0000 | ORAL_TABLET | Freq: Four times a day (QID) | ORAL | Status: DC | PRN

## 2012-11-25 NOTE — Progress Notes (Signed)
  Subjective:    Patient ID: [name], female    DOB: 12/17/1960, 52 y.o.   MRN: [id number]  HPI  52 year old female presents with right knee swelling and feels pinched in the front and back.  No known injuries.  Did start a Zumba class three weeks ago.  Knee started swelling before class started.  History of bone rubbing bone on the right knee per Dr. Althea Charon.  She did receive a shot in there a year ago.  He also pulled fluid off the right knee.  She takes Aleve and hydrocodone as needed.  Had shoulder surgery in 2003.    Review of Systems     Objective:   Physical Exam  Patient has mild swelling around the knee. There is no definite fluid present. She is tenderness over the medial and lateral joint space but more tenderness present medially. The patella is normal. There is a negative anterior drawer sign [facility] reading (PRIMARY) by  Dr Cleta Alberts there are spurs present on the top and bottom of the patella. There is loss of the medial joint space       Assessment & Plan:  We'll go ahead and refill her hydrocodone. She is to followup with Dr. Althea Charon if she continues to have problems

## 2012-12-21 ENCOUNTER — Other Ambulatory Visit: Payer: Self-pay | Admitting: *Deleted

## 2012-12-21 DIAGNOSIS — I10 Essential (primary) hypertension: Secondary | ICD-10-CM

## 2012-12-21 MED ORDER — AMLODIPINE BESYLATE 5 MG PO TABS: 5.0000 mg | ORAL_TABLET | Freq: Every day | ORAL | Status: DC

## 2012-12-21 NOTE — Telephone Encounter (Signed)
Rx fro amlodipine faxed to [facility]

## 2012-12-21 NOTE — Telephone Encounter (Signed)
Rx was refilled for Amlodlpine 5 mg.  Ag cma

## 2013-02-13 ENCOUNTER — Other Ambulatory Visit: Payer: Self-pay | Admitting: Family Medicine

## 2013-03-20 ENCOUNTER — Other Ambulatory Visit: Payer: Self-pay | Admitting: Family Medicine

## 2013-03-23 ENCOUNTER — Ambulatory Visit (INDEPENDENT_AMBULATORY_CARE_PROVIDER_SITE_OTHER): Payer: Managed Care, Other (non HMO) | Admitting: Family Medicine

## 2013-03-23 ENCOUNTER — Encounter: Payer: Self-pay | Admitting: Family Medicine

## 2013-03-23 VITALS — BP 120/80 | HR 73 | Temp 98.1°F | Resp 16 | Wt 279.4 lb

## 2013-03-23 DIAGNOSIS — M7541 Impingement syndrome of right shoulder: Secondary | ICD-10-CM | POA: Insufficient documentation

## 2013-03-23 DIAGNOSIS — M62838 Other muscle spasm: Secondary | ICD-10-CM

## 2013-03-23 MED ORDER — NAPROXEN 500 MG PO TABS: 500.0000 mg | ORAL_TABLET | Freq: Two times a day (BID) | ORAL | Status: DC

## 2013-03-23 MED ORDER — CYCLOBENZAPRINE HCL 10 MG PO TABS: 10.0000 mg | ORAL_TABLET | Freq: Three times a day (TID) | ORAL | Status: DC | PRN

## 2013-03-23 NOTE — Patient Instructions (Signed)
Follow up as needed Start the Naproxen twice daily- take w/ food Use the flexeril at night- will cause drowsiness HEAT! Call with any questions or concerns Hang in there! Happy Holidays!

## 2013-03-23 NOTE — Progress Notes (Signed)
   Subjective:    Patient ID: [name], female    DOB: 1960-07-03, 52 y.o.   MRN: [id number]  HPI Pre visit review using our clinic review tool, if applicable. No additional management support is needed unless otherwise documented below in the visit note.  R shoulder pain- sxs started 1 week ago.  Is having a 'pulling' sensation in R side of neck.  sxs started when they began incorporating yoga poses into Zumba.  Pain radiates from neck into front of shoulder.  Pain worsens w/ overhead motion.  Heat improves pain.  No weakness or numbness of arm.   Review of Systems For ROS see HPI     Objective:   Physical Exam  Vitals reviewed. Constitutional: She is oriented to person, place, and time. She appears well-developed and well-nourished. No distress.  HENT:  Head: Normocephalic and atraumatic.  Neck: Normal range of motion.  + trap spasm on R  Musculoskeletal: She exhibits tenderness (over R trap spasm).  Full ROM of neck and R shoulder  Lymphadenopathy:    She has no cervical adenopathy.  Neurological: She is alert and oriented to person, place, and time. She has normal reflexes. No cranial nerve deficit. Coordination normal.          Assessment & Plan:

## 2013-03-25 NOTE — Assessment & Plan Note (Signed)
New.  Start scheduled NSAIDs and muscle relaxer prn.  Reviewed supportive care and red flags that should prompt return.  Pt expressed understanding and is in agreement w/ plan.

## 2013-03-27 ENCOUNTER — Other Ambulatory Visit: Payer: Self-pay

## 2013-03-27 DIAGNOSIS — I10 Essential (primary) hypertension: Secondary | ICD-10-CM

## 2013-03-27 MED ORDER — AMLODIPINE BESYLATE 5 MG PO TABS: 5.0000 mg | ORAL_TABLET | Freq: Every day | ORAL | Status: DC

## 2013-04-11 ENCOUNTER — Telehealth: Payer: Self-pay | Admitting: *Deleted

## 2013-04-11 MED ORDER — HYDROCODONE-ACETAMINOPHEN 5-325 MG PO TABS: 1.0000 | ORAL_TABLET | Freq: Four times a day (QID) | ORAL | Status: DC | PRN

## 2013-04-11 NOTE — Telephone Encounter (Signed)
Ok for Hydrocodone 5/325mg  q6 prn, #30, no refills

## 2013-04-11 NOTE — Telephone Encounter (Signed)
Med filled and patient notified.

## 2013-04-11 NOTE — Telephone Encounter (Signed)
Patient called and stated that she was prescribed naproxen and states that it is not working. Patient states that she has hydrocodone on her medication list and would like to know if she can get that refilled.  Last seen-03/23/2013  Last filled-11/25/2012  [facility]-07/24/2012 low risk, contract signed   Please advise. SW

## 2013-05-04 ENCOUNTER — Other Ambulatory Visit: Payer: Self-pay | Admitting: Family Medicine

## 2013-05-09 ENCOUNTER — Ambulatory Visit (INDEPENDENT_AMBULATORY_CARE_PROVIDER_SITE_OTHER): Payer: Managed Care, Other (non HMO) | Admitting: Family Medicine

## 2013-05-09 ENCOUNTER — Encounter: Payer: Self-pay | Admitting: Family Medicine

## 2013-05-09 VITALS — BP 134/92 | HR 93 | Temp 98.4°F | Resp 12 | Wt 285.4 lb

## 2013-05-09 DIAGNOSIS — M25819 Other specified joint disorders, unspecified shoulder: Secondary | ICD-10-CM

## 2013-05-09 DIAGNOSIS — M7541 Impingement syndrome of right shoulder: Secondary | ICD-10-CM

## 2013-05-09 DIAGNOSIS — M7580 Other shoulder lesions, unspecified shoulder: Secondary | ICD-10-CM

## 2013-05-09 NOTE — Progress Notes (Signed)
   Subjective:    Patient ID: [name], female    DOB: 06/15/1960, 53 y.o.   MRN: [id number]  HPI R shoulder pain- pt was seen for similar in December and tx'd w/ Naproxen and flexeril.  Flexeril helps pt sleep but doesn't improve pain or spasm.  Pain will radiate from R shoulder up into neck.  'it just hurts'.  Some discomfort w/ overhead motion but worse w/ reaching across.   Review of Systems For ROS see HPI     Objective:   Physical Exam  Vitals reviewed. Constitutional: She is oriented to person, place, and time. She appears well-developed and well-nourished. No distress.  Cardiovascular: Intact distal pulses.   Musculoskeletal:  + impingement signs on R R trap spasm  Neurological: She is alert and oriented to person, place, and time. She has normal reflexes.          Assessment & Plan:

## 2013-05-09 NOTE — Assessment & Plan Note (Signed)
New.  Refer to ortho.  Continue NSAIDs and flexeril until ortho appt.

## 2013-05-09 NOTE — Patient Instructions (Signed)
Follow up as needed We'll notify you of your ortho appt Continue the flexeril and naproxen as needed ICE! Call with any questions or concerns Hang in there!!!

## 2013-05-09 NOTE — Progress Notes (Signed)
Pre visit review using our clinic review tool, if applicable. No additional management support is needed unless otherwise documented below in the visit note. 

## 2013-05-11 ENCOUNTER — Other Ambulatory Visit: Payer: Self-pay

## 2013-05-11 DIAGNOSIS — E876 Hypokalemia: Secondary | ICD-10-CM

## 2013-05-11 MED ORDER — OMEPRAZOLE 40 MG PO CPDR: DELAYED_RELEASE_CAPSULE | ORAL | Status: DC

## 2013-05-11 MED ORDER — POTASSIUM CHLORIDE ER 10 MEQ PO TBCR: 20.0000 meq | EXTENDED_RELEASE_TABLET | Freq: Two times a day (BID) | ORAL | Status: DC

## 2013-05-11 MED ORDER — NAPROXEN 500 MG PO TABS: 500.0000 mg | ORAL_TABLET | Freq: Two times a day (BID) | ORAL | Status: DC | PRN

## 2013-05-11 MED ORDER — CYCLOBENZAPRINE HCL 10 MG PO TABS: 10.0000 mg | ORAL_TABLET | Freq: Three times a day (TID) | ORAL | Status: DC | PRN

## 2013-07-19 ENCOUNTER — Other Ambulatory Visit: Payer: Self-pay

## 2013-07-19 DIAGNOSIS — I10 Essential (primary) hypertension: Secondary | ICD-10-CM

## 2013-07-19 MED ORDER — AMLODIPINE BESYLATE 5 MG PO TABS: 5.0000 mg | ORAL_TABLET | Freq: Every day | ORAL | Status: DC

## 2013-07-31 ENCOUNTER — Encounter: Payer: Self-pay | Admitting: Family Medicine

## 2013-08-22 ENCOUNTER — Other Ambulatory Visit: Payer: Self-pay | Admitting: Family Medicine

## 2013-08-28 ENCOUNTER — Encounter: Payer: Self-pay | Admitting: Family Medicine

## 2013-08-28 ENCOUNTER — Ambulatory Visit (INDEPENDENT_AMBULATORY_CARE_PROVIDER_SITE_OTHER): Payer: Managed Care, Other (non HMO) | Admitting: Family Medicine

## 2013-08-28 VITALS — BP 132/86 | HR 75 | Temp 98.0°F | Wt 291.8 lb

## 2013-08-28 DIAGNOSIS — I10 Essential (primary) hypertension: Secondary | ICD-10-CM

## 2013-08-28 LAB — BASIC METABOLIC PANEL
BUN: 20 mg/dL (ref 6–23)
CO2: 29 mEq/L (ref 19–32)
Calcium: 9 mg/dL (ref 8.4–10.5)
Chloride: 105 mEq/L (ref 96–112)
Creatinine, Ser: 1 mg/dL (ref 0.4–1.2)
GFR: 74.69 mL/min (ref >60.00)
Glucose, Bld: 73 mg/dL (ref 70–99)
POTASSIUM: 3.7 meq/L (ref 3.5–5.1)
SODIUM: 139 meq/L (ref 135–145)

## 2013-08-28 MED ORDER — AMLODIPINE BESYLATE 10 MG PO TABS: 10.0000 mg | ORAL_TABLET | Freq: Every day | ORAL | Status: DC

## 2013-08-28 NOTE — Patient Instructions (Signed)

## 2013-08-28 NOTE — Progress Notes (Signed)
  Subjective:    Patient here for follow-up of elevated blood pressure.  She is not exercising and is adherent to a low-salt diet.  Blood pressure is well controlled at home. Cardiac symptoms: none. Patient denies: chest pain, chest pressure/discomfort, claudication, dyspnea, exertional chest pressure/discomfort, fatigue, irregular heart beat, lower extremity edema, near-syncope, orthopnea, palpitations, paroxysmal nocturnal dyspnea, syncope and tachypnea. Cardiovascular risk factors: hypertension, obesity (BMI >= 30 kg/m2) and sedentary lifestyle. Use of agents associated with hypertension: none. History of target organ damage: none.  The following portions of the patient's history were reviewed and updated as appropriate: allergies, current medications, past family history, past medical history, past social history, past surgical history and problem list.  Review of Systems Pertinent items are noted in HPI.     Objective:    BP 132/86  Pulse 75  Temp(Src) 98 F (36.7 C) (Oral)  Wt 291 lb 12.8 oz (132.36 kg)  SpO2 98% General appearance: alert, cooperative, appears stated age and no distress Throat: lips, mucosa, and tongue normal; teeth and gums normal Neck: no adenopathy, supple, symmetrical, trachea midline and thyroid not enlarged, symmetric, no tenderness/mass/nodules Lungs: clear to auscultation bilaterally Heart: S1, S2 normal Extremities: extremities normal, atraumatic, no cyanosis or edema    Assessment:    Hypertension, stage 1 . Evidence of target organ damage: none.    Plan:    Medication: increase to norvasc 10 mg. Dietary sodium restriction. Regular aerobic exercise. Check blood pressures 2-3 times weekly and record. Follow up: 3 months and as needed.

## 2013-08-28 NOTE — Progress Notes (Signed)
Pre visit review using our clinic review tool, if applicable. No additional management support is needed unless otherwise documented below in the visit note. 

## 2013-09-19 ENCOUNTER — Encounter: Payer: Self-pay | Admitting: Family Medicine

## 2013-09-20 ENCOUNTER — Other Ambulatory Visit: Payer: Self-pay | Admitting: Family Medicine

## 2013-09-26 ENCOUNTER — Other Ambulatory Visit: Payer: Self-pay | Admitting: Family Medicine

## 2013-09-26 ENCOUNTER — Telehealth: Payer: Self-pay | Admitting: Family Medicine

## 2013-09-26 NOTE — Telephone Encounter (Signed)
Caller name: [name] Relation to pt: patient Call back number: [place], [place]   Reason for call: patient called stating that her pharmacy refilled amlodipine instead of Losartan. Please advise.

## 2013-09-26 NOTE — Telephone Encounter (Signed)
Patient is aware the med's have been faxed and she would need to call the pharmacy to get her Rx refills.     KP

## 2013-10-11 ENCOUNTER — Other Ambulatory Visit: Payer: Self-pay | Admitting: Family Medicine

## 2013-11-28 ENCOUNTER — Telehealth: Payer: Self-pay | Admitting: Family Medicine

## 2013-11-28 ENCOUNTER — Encounter: Payer: Self-pay | Admitting: Family Medicine

## 2013-11-28 ENCOUNTER — Ambulatory Visit (INDEPENDENT_AMBULATORY_CARE_PROVIDER_SITE_OTHER): Payer: Managed Care, Other (non HMO) | Admitting: Family Medicine

## 2013-11-28 ENCOUNTER — Other Ambulatory Visit (HOSPITAL_COMMUNITY): Payer: Self-pay | Admitting: Cardiology

## 2013-11-28 VITALS — BP 134/76 | HR 69 | Temp 98.1°F | Ht 68.0 in | Wt 287.0 lb

## 2013-11-28 DIAGNOSIS — R0989 Other specified symptoms and signs involving the circulatory and respiratory systems: Secondary | ICD-10-CM

## 2013-11-28 DIAGNOSIS — B354 Tinea corporis: Secondary | ICD-10-CM

## 2013-11-28 DIAGNOSIS — I6529 Occlusion and stenosis of unspecified carotid artery: Secondary | ICD-10-CM

## 2013-11-28 DIAGNOSIS — Z0000 Encounter for general adult medical examination without abnormal findings: Secondary | ICD-10-CM

## 2013-11-28 DIAGNOSIS — I10 Essential (primary) hypertension: Secondary | ICD-10-CM

## 2013-11-28 DIAGNOSIS — R011 Cardiac murmur, unspecified: Secondary | ICD-10-CM

## 2013-11-28 MED ORDER — NAFTIFINE HCL 1 % EX CREA: TOPICAL_CREAM | Freq: Every day | CUTANEOUS | Status: DC

## 2013-11-28 NOTE — Progress Notes (Signed)
Subjective:     [name] is a 53 y.o. female and is here for a comprehensive physical exam. The patient reports no problems.  History   Social History  . Marital Status: Married    Spouse Name: N/A    Number of Children: 0  . Years of Education: N/A   Occupational History  . [employer]   Social History Main Topics  . Smoking status: Never Smoker   . Smokeless tobacco: Never Used  . Alcohol Use: No  . Drug Use: No  . Sexual Activity: Yes    Partners: Male   Other Topics Concern  . Not on file   Social History Narrative   Regular exercise- joined [employer]--- plans to go 5 days a week   Health Maintenance  Topic Date Due  . Influenza Vaccine  01/28/2014 (Originally 11/17/2013)  . Mammogram  06/18/2014  . Pap Smear  06/18/2015  . Tetanus/tdap  04/30/2018  . Colonoscopy  09/26/2018    The following portions of the patient's history were reviewed and updated as appropriate:  She  has a past medical history of Hypertension; Post-menopausal; Left shoulder pain; Diverticulosis of colon (without mention of hemorrhage); Abdominal pain, other specified site; Hypokalemia; Benign neoplasm of skin, site unspecified; Family history of ischemic heart disease; Family history of malignant neoplasm of genital organ, other; Disturbance of skin sensation; Sprain and strain of unspecified site of knee and leg; Sprain of neck; Acute upper respiratory infections of unspecified site; Family history of diabetes mellitus; Unspecified essential hypertension; and GERD (gastroesophageal reflux disease). She  does not have any pertinent problems on file. She  has past surgical history that includes Total abdominal hysterectomy w/ bilateral salpingoophorectomy (2002); Bunionectomy (2005); Shoulder surgery; and Abdominal hysterectomy. Her family history includes Dementia in her mother; Diabetes in her brother and mother; Hypertension in her father, mother, and sister; Prostate cancer  in her father; Stroke in her maternal aunt and mother. There is no history of Colon cancer or Colon cancer. She  reports that she has never smoked. She has never used smokeless tobacco. She reports that she does not drink alcohol or use illicit drugs. She has a current medication list which includes the following prescription(s): amlodipine, estradiol, fluticasone, hydrocodone-acetaminophen, hydroxychloroquine, losartan-hydrochlorothiazide, naproxen, naproxen sodium, omeprazole, potassium chloride, and restasis. Current Outpatient Prescriptions on File Prior to Visit  Medication Sig Dispense Refill  . amLODipine (NORVASC) 10 MG tablet Take 1 tablet (10 mg total) by mouth daily.  90 tablet  3  . estradiol (ESTRACE) 2 MG tablet Take 1 mg by mouth as needed.       . fluticasone (FLONASE) 50 MCG/ACT nasal spray Place 2 sprays into the nose daily.  16 g  2  . HYDROcodone-acetaminophen (NORCO/VICODIN) 5-325 MG per tablet Take 1 tablet by mouth every 6 (six) hours as needed for moderate pain.  30 tablet  0  . hydroxychloroquine (PLAQUENIL) 200 MG tablet Take 200 mg by mouth 2 (two) times daily.        Marland Kitchen losartan-hydrochlorothiazide (HYZAAR) 100-25 MG per tablet Take 1 tablet by mouth daily.  30 tablet  5  . naproxen (NAPROSYN) 500 MG tablet TAKE 1 TABLET (500 MG TOTAL) BY MOUTH 2 (TWO) TIMES DAILY BETWEEN MEALS AS NEEDED.  60 tablet  1  . Naproxen Sodium (ALEVE PO) Take by mouth as needed.      Marland Kitchen omeprazole (PRILOSEC) 40 MG capsule TAKE 1 CAPSULE (40 MG TOTAL) BY MOUTH DAILY.  30 capsule  11  . potassium chloride (K-DUR) 10 MEQ tablet Take 2 tablets (20 mEq total) by mouth 2 (two) times daily.  60 tablet  2  . RESTASIS 0.05 % ophthalmic emulsion Place 1 drop into both eyes daily.        No current facility-administered medications on file prior to visit.   She has No Known Allergies..  Review of Systems Review of Systems  Constitutional: Negative for activity change, appetite change and fatigue.   HENT: Negative for hearing loss, congestion, tinnitus and ear discharge.  dentist q33m Eyes: Negative for visual disturbance (see optho q1y -- vision corrected to 20/20 with glasses).  Respiratory: Negative for cough, chest tightness and shortness of breath.   Cardiovascular: Negative for chest pain, palpitations and leg swelling.  Gastrointestinal: Negative for abdominal pain, diarrhea, constipation and abdominal distention.  Genitourinary: Negative for urgency, frequency, decreased urine volume and difficulty urinating.  Musculoskeletal: Negative for back pain, arthralgias and gait problem.  Skin: Negative for color change, pallor and rash.  Neurological: Negative for dizziness, light-headedness, numbness and headaches.  Hematological: Negative for adenopathy. Does not bruise/bleed easily.  Psychiatric/Behavioral: Negative for suicidal ideas, confusion, sleep disturbance, self-injury, dysphoric mood, decreased concentration and agitation.       Objective:    BP 134/76  Pulse 69  Temp(Src) 98.1 F (36.7 C) (Oral)  Ht 5\' 8"  (1.727 m)  Wt 287 lb (130.182 kg)  BMI 43.65 kg/m2  SpO2 95% General appearance: alert, cooperative, appears stated age and no distress Head: Normocephalic, without obvious abnormality, atraumatic Eyes: negative findings: lids and lashes normal, conjunctivae and sclerae normal and pupils equal, round, reactive to light and accomodation Ears: normal TM's and external ear canals both ears Nose: Nares normal. Septum midline. Mucosa normal. No drainage or sinus tenderness. Throat: lips, mucosa, and tongue normal; teeth and gums normal Neck: no adenopathy, no JVD, supple, symmetrical, trachea midline, thyroid not enlarged, symmetric, no tenderness/mass/nodules and L bruit Back: symmetric, no curvature. ROM normal. No CVA tenderness. Lungs: clear to auscultation bilaterally Breasts: gyn Heart: S1, S2 normal --murmur   soft, non-tender; bowel sounds normal; no  masses,  no organomegaly Pelvic: deferred ---gyn Extremities: extremities normal, atraumatic, no cyanosis or edema Pulses: 2+ and symmetric Skin: hyperpigmentation - axilla b/l Lymph nodes: Cervical, supraclavicular, and axillary nodes normal. Neurologic: Alert and oriented X 3, normal strength and tone. Normal symmetric reflexes. Normal coordination and gait Psych-- no depression, no anxiety      Assessment:    Healthy female exam.      Plan:    check labs See After Visit Summary for Counseling Recommendations   1. Tinea corporis  - naftifine (NAFTIN) 1 % cream; Apply topically daily.  Dispense: 30 g; Refill: 0  2. Preventative health care  - Basic metabolic panel; Future - CBC with Differential; Future - Hepatic function panel; Future - Lipid panel; Future - POCT urinalysis dipstick; Future - TSH; Future  3. Essential hypertension Stable, con't meds - Basic metabolic panel; Future - CBC with Differential; Future - Hepatic function panel; Future - Lipid panel; Future - POCT urinalysis dipstick; Future - TSH; Future - 2D Echocardiogram without contrast; Future  4. Undiagnosed cardiac murmurs  - 2D Echocardiogram without contrast; Future  5. Left carotid bruit  - US Carotid Duplex Bilateral; Future

## 2013-11-28 NOTE — Progress Notes (Signed)
Pre visit review using our clinic review tool, if applicable. No additional management support is needed unless otherwise documented below in the visit note. 

## 2013-11-28 NOTE — Telephone Encounter (Signed)
Relevant patient education assigned to patient using Emmi. ° °

## 2013-11-28 NOTE — Patient Instructions (Signed)
Preventive Care for Adults A healthy lifestyle and preventive care can promote health and wellness. Preventive health guidelines for women include the following key practices.  A routine yearly physical is a good way to check with your health care provider about your health and preventive screening. It is a chance to share any concerns and updates on your health and to receive a thorough exam.  Visit your dentist for a routine exam and preventive care every 6 months. Brush your teeth twice a day and floss once a day. Good oral hygiene prevents tooth decay and gum disease.  The frequency of eye exams is based on your age, health, family medical history, use of contact lenses, and other factors. Follow your health care provider's recommendations for frequency of eye exams.  Eat a healthy diet. Foods like vegetables, fruits, whole grains, low-fat dairy products, and lean protein foods contain the nutrients you need without too many calories. Decrease your intake of foods high in solid fats, added sugars, and salt. Eat the right amount of calories for you.Get information about a proper diet from your health care provider, if necessary.  Regular physical exercise is one of the most important things you can do for your health. Most adults should get at least 150 minutes of moderate-intensity exercise (any activity that increases your heart rate and causes you to sweat) each week. In addition, most adults need muscle-strengthening exercises on 2 or more days a week.  Maintain a healthy weight. The body mass index (BMI) is a screening tool to identify possible weight problems. It provides an estimate of body fat based on height and weight. Your health care provider can find your BMI and can help you achieve or maintain a healthy weight.For adults 20 years and older:  A BMI below 18.5 is considered underweight.  A BMI of 18.5 to 24.9 is normal.  A BMI of 25 to 29.9 is considered overweight.  A BMI of  30 and above is considered obese.  Maintain normal blood lipids and cholesterol levels by exercising and minimizing your intake of saturated fat. Eat a balanced diet with plenty of fruit and vegetables. Blood tests for lipids and cholesterol should begin at age 76 and be repeated every 5 years. If your lipid or cholesterol levels are high, you are over 50, or you are at high risk for heart disease, you may need your cholesterol levels checked more frequently.Ongoing high lipid and cholesterol levels should be treated with medicines if diet and exercise are not working.  If you smoke, find out from your health care provider how to quit. If you do not use tobacco, do not start.  Lung cancer screening is recommended for adults aged 22-80 years who are at high risk for developing lung cancer because of a history of smoking. A yearly low-dose CT scan of the lungs is recommended for people who have at least a 30-pack-year history of smoking and are a current smoker or have quit within the past 15 years. A pack year of smoking is smoking an average of 1 pack of cigarettes a day for 1 year (for example: 1 pack a day for 30 years or 2 packs a day for 15 years). Yearly screening should continue until the smoker has stopped smoking for at least 15 years. Yearly screening should be stopped for people who develop a health problem that would prevent them from having lung cancer treatment.  If you are pregnant, do not drink alcohol. If you are breastfeeding,  be very cautious about drinking alcohol. If you are not pregnant and choose to drink alcohol, do not have more than 1 drink per day. One drink is considered to be 12 ounces (355 mL) of beer, 5 ounces (148 mL) of wine, or 1.5 ounces (44 mL) of liquor.  Avoid use of street drugs. Do not share needles with anyone. Ask for help if you need support or instructions about stopping the use of drugs.  High blood pressure causes heart disease and increases the risk of  stroke. Your blood pressure should be checked at least every 1 to 2 years. Ongoing high blood pressure should be treated with medicines if weight loss and exercise do not work.  If you are 75-52 years old, ask your health care provider if you should take aspirin to prevent strokes.  Diabetes screening involves taking a blood sample to check your fasting blood sugar level. This should be done once every 3 years, after age 15, if you are within normal weight and without risk factors for diabetes. Testing should be considered at a younger age or be carried out more frequently if you are overweight and have at least 1 risk factor for diabetes.  Breast cancer screening is essential preventive care for women. You should practice "breast self-awareness." This means understanding the normal appearance and feel of your breasts and may include breast self-examination. Any changes detected, no matter how small, should be reported to a health care provider. Women in their 58s and 30s should have a clinical breast exam (CBE) by a health care provider as part of a regular health exam every 1 to 3 years. After age 16, women should have a CBE every year. Starting at age 53, women should consider having a mammogram (breast X-ray test) every year. Women who have a family history of breast cancer should talk to their health care provider about genetic screening. Women at a high risk of breast cancer should talk to their health care providers about having an MRI and a mammogram every year.  Breast cancer gene (BRCA)-related cancer risk assessment is recommended for women who have family members with BRCA-related cancers. BRCA-related cancers include breast, ovarian, tubal, and peritoneal cancers. Having family members with these cancers may be associated with an increased risk for harmful changes (mutations) in the breast cancer genes BRCA1 and BRCA2. Results of the assessment will determine the need for genetic counseling and  BRCA1 and BRCA2 testing.  Routine pelvic exams to screen for cancer are no longer recommended for nonpregnant women who are considered low risk for cancer of the pelvic organs (ovaries, uterus, and vagina) and who do not have symptoms. Ask your health care provider if a screening pelvic exam is right for you.  If you have had past treatment for cervical cancer or a condition that could lead to cancer, you need Pap tests and screening for cancer for at least 20 years after your treatment. If Pap tests have been discontinued, your risk factors (such as having a new sexual partner) need to be reassessed to determine if screening should be resumed. Some women have medical problems that increase the chance of getting cervical cancer. In these cases, your health care provider may recommend more frequent screening and Pap tests.  The HPV test is an additional test that may be used for cervical cancer screening. The HPV test looks for the virus that can cause the cell changes on the cervix. The cells collected during the Pap test can be  tested for HPV. The HPV test could be used to screen women aged 30 years and older, and should be used in women of any age who have unclear Pap test results. After the age of 30, women should have HPV testing at the same frequency as a Pap test.  Colorectal cancer can be detected and often prevented. Most routine colorectal cancer screening begins at the age of 50 years and continues through age 75 years. However, your health care provider may recommend screening at an earlier age if you have risk factors for colon cancer. On a yearly basis, your health care provider may provide home test kits to check for hidden blood in the stool. Use of a small camera at the end of a tube, to directly examine the colon (sigmoidoscopy or colonoscopy), can detect the earliest forms of colorectal cancer. Talk to your health care provider about this at age 50, when routine screening begins. Direct  exam of the colon should be repeated every 5-10 years through age 75 years, unless early forms of pre-cancerous polyps or small growths are found.  People who are at an increased risk for hepatitis B should be screened for this virus. You are considered at high risk for hepatitis B if:  You were born in a country where hepatitis B occurs often. Talk with your health care provider about which countries are considered high risk.  Your parents were born in a high-risk country and you have not received a shot to protect against hepatitis B (hepatitis B vaccine).  You have HIV or AIDS.  You use needles to inject street drugs.  You live with, or have sex with, someone who has hepatitis B.  You get hemodialysis treatment.  You take certain medicines for conditions like cancer, organ transplantation, and autoimmune conditions.  Hepatitis C blood testing is recommended for all people born from 1945 through 1965 and any individual with known risks for hepatitis C.  Practice safe sex. Use condoms and avoid high-risk sexual practices to reduce the spread of sexually transmitted infections (STIs). STIs include gonorrhea, chlamydia, syphilis, trichomonas, herpes, HPV, and human immunodeficiency virus (HIV). Herpes, HIV, and HPV are viral illnesses that have no cure. They can result in disability, cancer, and death.  You should be screened for sexually transmitted illnesses (STIs) including gonorrhea and chlamydia if:  You are sexually active and are younger than 24 years.  You are older than 24 years and your health care provider tells you that you are at risk for this type of infection.  Your sexual activity has changed since you were last screened and you are at an increased risk for chlamydia or gonorrhea. Ask your health care provider if you are at risk.  If you are at risk of being infected with HIV, it is recommended that you take a prescription medicine daily to prevent HIV infection. This is  called preexposure prophylaxis (PrEP). You are considered at risk if:  You are a heterosexual woman, are sexually active, and are at increased risk for HIV infection.  You take drugs by injection.  You are sexually active with a partner who has HIV.  Talk with your health care provider about whether you are at high risk of being infected with HIV. If you choose to begin PrEP, you should first be tested for HIV. You should then be tested every 3 months for as long as you are taking PrEP.  Osteoporosis is a disease in which the bones lose minerals and strength   with aging. This can result in serious bone fractures or breaks. The risk of osteoporosis can be identified using a bone density scan. Women ages 65 years and over and women at risk for fractures or osteoporosis should discuss screening with their health care providers. Ask your health care provider whether you should take a calcium supplement or vitamin D to reduce the rate of osteoporosis.  Menopause can be associated with physical symptoms and risks. Hormone replacement therapy is available to decrease symptoms and risks. You should talk to your health care provider about whether hormone replacement therapy is right for you.  Use sunscreen. Apply sunscreen liberally and repeatedly throughout the day. You should seek shade when your shadow is shorter than you. Protect yourself by wearing long sleeves, pants, a wide-brimmed hat, and sunglasses year round, whenever you are outdoors.  Once a month, do a whole body skin exam, using a mirror to look at the skin on your back. Tell your health care provider of new moles, moles that have irregular borders, moles that are larger than a pencil eraser, or moles that have changed in shape or color.  Stay current with required vaccines (immunizations).  Influenza vaccine. All adults should be immunized every year.  Tetanus, diphtheria, and acellular pertussis (Td, Tdap) vaccine. Pregnant women should  receive 1 dose of Tdap vaccine during each pregnancy. The dose should be obtained regardless of the length of time since the last dose. Immunization is preferred during the 27th-36th week of gestation. An adult who has not previously received Tdap or who does not know her vaccine status should receive 1 dose of Tdap. This initial dose should be followed by tetanus and diphtheria toxoids (Td) booster doses every 10 years. Adults with an unknown or incomplete history of completing a 3-dose immunization series with Td-containing vaccines should begin or complete a primary immunization series including a Tdap dose. Adults should receive a Td booster every 10 years.  Varicella vaccine. An adult without evidence of immunity to varicella should receive 2 doses or a second dose if she has previously received 1 dose. Pregnant females who do not have evidence of immunity should receive the first dose after pregnancy. This first dose should be obtained before leaving the health care facility. The second dose should be obtained 4-8 weeks after the first dose.  Human papillomavirus (HPV) vaccine. Females aged 13-26 years who have not received the vaccine previously should obtain the 3-dose series. The vaccine is not recommended for use in pregnant females. However, pregnancy testing is not needed before receiving a dose. If a female is found to be pregnant after receiving a dose, no treatment is needed. In that case, the remaining doses should be delayed until after the pregnancy. Immunization is recommended for any person with an immunocompromised condition through the age of 26 years if she did not get any or all doses earlier. During the 3-dose series, the second dose should be obtained 4-8 weeks after the first dose. The third dose should be obtained 24 weeks after the first dose and 16 weeks after the second dose.  Zoster vaccine. One dose is recommended for adults aged 60 years or older unless certain conditions are  present.  Measles, mumps, and rubella (MMR) vaccine. Adults born before 1957 generally are considered immune to measles and mumps. Adults born in 1957 or later should have 1 or more doses of MMR vaccine unless there is a contraindication to the vaccine or there is laboratory evidence of immunity to   each of the three diseases. A routine second dose of MMR vaccine should be obtained at least 28 days after the first dose for students attending postsecondary schools, health care workers, or international travelers. People who received inactivated measles vaccine or an unknown type of measles vaccine during 1963-1967 should receive 2 doses of MMR vaccine. People who received inactivated mumps vaccine or an unknown type of mumps vaccine before 1979 and are at high risk for mumps infection should consider immunization with 2 doses of MMR vaccine. For females of childbearing age, rubella immunity should be determined. If there is no evidence of immunity, females who are not pregnant should be vaccinated. If there is no evidence of immunity, females who are pregnant should delay immunization until after pregnancy. Unvaccinated health care workers born before 1957 who lack laboratory evidence of measles, mumps, or rubella immunity or laboratory confirmation of disease should consider measles and mumps immunization with 2 doses of MMR vaccine or rubella immunization with 1 dose of MMR vaccine.  Pneumococcal 13-valent conjugate (PCV13) vaccine. When indicated, a person who is uncertain of her immunization history and has no record of immunization should receive the PCV13 vaccine. An adult aged 19 years or older who has certain medical conditions and has not been previously immunized should receive 1 dose of PCV13 vaccine. This PCV13 should be followed with a dose of pneumococcal polysaccharide (PPSV23) vaccine. The PPSV23 vaccine dose should be obtained at least 8 weeks after the dose of PCV13 vaccine. An adult aged 19  years or older who has certain medical conditions and previously received 1 or more doses of PPSV23 vaccine should receive 1 dose of PCV13. The PCV13 vaccine dose should be obtained 1 or more years after the last PPSV23 vaccine dose.  Pneumococcal polysaccharide (PPSV23) vaccine. When PCV13 is also indicated, PCV13 should be obtained first. All adults aged 65 years and older should be immunized. An adult younger than age 65 years who has certain medical conditions should be immunized. Any person who resides in a nursing home or long-term care facility should be immunized. An adult smoker should be immunized. People with an immunocompromised condition and certain other conditions should receive both PCV13 and PPSV23 vaccines. People with human immunodeficiency virus (HIV) infection should be immunized as soon as possible after diagnosis. Immunization during chemotherapy or radiation therapy should be avoided. Routine use of PPSV23 vaccine is not recommended for American Indians, [place] Natives, or people younger than 65 years unless there are medical conditions that require PPSV23 vaccine. When indicated, people who have unknown immunization and have no record of immunization should receive PPSV23 vaccine. One-time revaccination 5 years after the first dose of PPSV23 is recommended for people aged 19-64 years who have chronic kidney failure, nephrotic syndrome, asplenia, or immunocompromised conditions. People who received 1-2 doses of PPSV23 before age 65 years should receive another dose of PPSV23 vaccine at age 65 years or later if at least 5 years have passed since the previous dose. Doses of PPSV23 are not needed for people immunized with PPSV23 at or after age 65 years.  Meningococcal vaccine. Adults with asplenia or persistent complement component deficiencies should receive 2 doses of quadrivalent meningococcal conjugate (MenACWY-D) vaccine. The doses should be obtained at least 2 months apart.  Microbiologists working with certain meningococcal bacteria, military recruits, people at risk during an outbreak, and people who travel to or live in countries with a high rate of meningitis should be immunized. A first-year college student up through age   21 years who is living in a residence hall should receive a dose if she did not receive a dose on or after her 16th birthday. Adults who have certain high-risk conditions should receive one or more doses of vaccine.  Hepatitis A vaccine. Adults who wish to be protected from this disease, have certain high-risk conditions, work with hepatitis A-infected animals, work in hepatitis A research labs, or travel to or work in countries with a high rate of hepatitis A should be immunized. Adults who were previously unvaccinated and who anticipate close contact with an international adoptee during the first 60 days after arrival in the [place] from a country with a high rate of hepatitis A should be immunized.  Hepatitis B vaccine. Adults who wish to be protected from this disease, have certain high-risk conditions, may be exposed to blood or other infectious body fluids, are household contacts or sex partners of hepatitis B positive people, are clients or workers in certain care facilities, or travel to or work in countries with a high rate of hepatitis B should be immunized.  Haemophilus influenzae type b (Hib) vaccine. A previously unvaccinated person with asplenia or sickle cell disease or having a scheduled splenectomy should receive 1 dose of Hib vaccine. Regardless of previous immunization, a recipient of a hematopoietic stem cell transplant should receive a 3-dose series 6-12 months after her successful transplant. Hib vaccine is not recommended for adults with HIV infection. Preventive Services / Frequency Ages 64 to 68 years  Blood pressure check.** / Every 1 to 2 years.  Lipid and cholesterol check.** / Every 5 years beginning at age  22.  Clinical breast exam.** / Every 3 years for women in their 88s and 53s.  BRCA-related cancer risk assessment.** / For women who have family members with a BRCA-related cancer (breast, ovarian, tubal, or peritoneal cancers).  Pap test.** / Every 2 years from ages 90 through 51. Every 3 years starting at age 21 through age 56 or 3 with a history of 3 consecutive normal Pap tests.  HPV screening.** / Every 3 years from ages 24 through ages 1 to 46 with a history of 3 consecutive normal Pap tests.  Hepatitis C blood test.** / For any individual with known risks for hepatitis C.  Skin self-exam. / Monthly.  Influenza vaccine. / Every year.  Tetanus, diphtheria, and acellular pertussis (Tdap, Td) vaccine.** / Consult your health care provider. Pregnant women should receive 1 dose of Tdap vaccine during each pregnancy. 1 dose of Td every 10 years.  Varicella vaccine.** / Consult your health care provider. Pregnant females who do not have evidence of immunity should receive the first dose after pregnancy.  HPV vaccine. / 3 doses over 6 months, if 72 and younger. The vaccine is not recommended for use in pregnant females. However, pregnancy testing is not needed before receiving a dose.  Measles, mumps, rubella (MMR) vaccine.** / You need at least 1 dose of MMR if you were born in 1957 or later. You may also need a 2nd dose. For females of childbearing age, rubella immunity should be determined. If there is no evidence of immunity, females who are not pregnant should be vaccinated. If there is no evidence of immunity, females who are pregnant should delay immunization until after pregnancy.  Pneumococcal 13-valent conjugate (PCV13) vaccine.** / Consult your health care provider.  Pneumococcal polysaccharide (PPSV23) vaccine.** / 1 to 2 doses if you smoke cigarettes or if you have certain conditions.  Meningococcal vaccine.** /  1 dose if you are age 19 to 21 years and a first-year college  student living in a residence hall, or have one of several medical conditions, you need to get vaccinated against meningococcal disease. You may also need additional booster doses.  Hepatitis A vaccine.** / Consult your health care provider.  Hepatitis B vaccine.** / Consult your health care provider.  Haemophilus influenzae type b (Hib) vaccine.** / Consult your health care provider. Ages 40 to 64 years  Blood pressure check.** / Every 1 to 2 years.  Lipid and cholesterol check.** / Every 5 years beginning at age 20 years.  Lung cancer screening. / Every year if you are aged 55-80 years and have a 30-pack-year history of smoking and currently smoke or have quit within the past 15 years. Yearly screening is stopped once you have quit smoking for at least 15 years or develop a health problem that would prevent you from having lung cancer treatment.  Clinical breast exam.** / Every year after age 40 years.  BRCA-related cancer risk assessment.** / For women who have family members with a BRCA-related cancer (breast, ovarian, tubal, or peritoneal cancers).  Mammogram.** / Every year beginning at age 40 years and continuing for as long as you are in good health. Consult with your health care provider.  Pap test.** / Every 3 years starting at age 30 years through age 65 or 70 years with a history of 3 consecutive normal Pap tests.  HPV screening.** / Every 3 years from ages 30 years through ages 65 to 70 years with a history of 3 consecutive normal Pap tests.  Fecal occult blood test (FOBT) of stool. / Every year beginning at age 50 years and continuing until age 75 years. You may not need to do this test if you get a colonoscopy every 10 years.  Flexible sigmoidoscopy or colonoscopy.** / Every 5 years for a flexible sigmoidoscopy or every 10 years for a colonoscopy beginning at age 50 years and continuing until age 75 years.  Hepatitis C blood test.** / For all people born from 1945 through  1965 and any individual with known risks for hepatitis C.  Skin self-exam. / Monthly.  Influenza vaccine. / Every year.  Tetanus, diphtheria, and acellular pertussis (Tdap/Td) vaccine.** / Consult your health care provider. Pregnant women should receive 1 dose of Tdap vaccine during each pregnancy. 1 dose of Td every 10 years.  Varicella vaccine.** / Consult your health care provider. Pregnant females who do not have evidence of immunity should receive the first dose after pregnancy.  Zoster vaccine.** / 1 dose for adults aged 60 years or older.  Measles, mumps, rubella (MMR) vaccine.** / You need at least 1 dose of MMR if you were born in 1957 or later. You may also need a 2nd dose. For females of childbearing age, rubella immunity should be determined. If there is no evidence of immunity, females who are not pregnant should be vaccinated. If there is no evidence of immunity, females who are pregnant should delay immunization until after pregnancy.  Pneumococcal 13-valent conjugate (PCV13) vaccine.** / Consult your health care provider.  Pneumococcal polysaccharide (PPSV23) vaccine.** / 1 to 2 doses if you smoke cigarettes or if you have certain conditions.  Meningococcal vaccine.** / Consult your health care provider.  Hepatitis A vaccine.** / Consult your health care provider.  Hepatitis B vaccine.** / Consult your health care provider.  Haemophilus influenzae type b (Hib) vaccine.** / Consult your health care provider. Ages 65   years and over  Blood pressure check.** / Every 1 to 2 years.  Lipid and cholesterol check.** / Every 5 years beginning at age 22 years.  Lung cancer screening. / Every year if you are aged 73-80 years and have a 30-pack-year history of smoking and currently smoke or have quit within the past 15 years. Yearly screening is stopped once you have quit smoking for at least 15 years or develop a health problem that would prevent you from having lung cancer  treatment.  Clinical breast exam.** / Every year after age 4 years.  BRCA-related cancer risk assessment.** / For women who have family members with a BRCA-related cancer (breast, ovarian, tubal, or peritoneal cancers).  Mammogram.** / Every year beginning at age 40 years and continuing for as long as you are in good health. Consult with your health care provider.  Pap test.** / Every 3 years starting at age 9 years through age 34 or 91 years with 3 consecutive normal Pap tests. Testing can be stopped between 65 and 70 years with 3 consecutive normal Pap tests and no abnormal Pap or HPV tests in the past 10 years.  HPV screening.** / Every 3 years from ages 57 years through ages 64 or 45 years with a history of 3 consecutive normal Pap tests. Testing can be stopped between 65 and 70 years with 3 consecutive normal Pap tests and no abnormal Pap or HPV tests in the past 10 years.  Fecal occult blood test (FOBT) of stool. / Every year beginning at age 15 years and continuing until age 17 years. You may not need to do this test if you get a colonoscopy every 10 years.  Flexible sigmoidoscopy or colonoscopy.** / Every 5 years for a flexible sigmoidoscopy or every 10 years for a colonoscopy beginning at age 86 years and continuing until age 71 years.  Hepatitis C blood test.** / For all people born from [birth year] through 1965 and any individual with known risks for hepatitis C.  Osteoporosis screening.** / A one-time screening for women ages 83 years and over and women at risk for fractures or osteoporosis.  Skin self-exam. / Monthly.  Influenza vaccine. / Every year.  Tetanus, diphtheria, and acellular pertussis (Tdap/Td) vaccine.** / 1 dose of Td every 10 years.  Varicella vaccine.** / Consult your health care provider.  Zoster vaccine.** / 1 dose for adults aged 61 years or older.  Pneumococcal 13-valent conjugate (PCV13) vaccine.** / Consult your health care provider.  Pneumococcal  polysaccharide (PPSV23) vaccine.** / 1 dose for all adults aged 28 years and older.  Meningococcal vaccine.** / Consult your health care provider.  Hepatitis A vaccine.** / Consult your health care provider.  Hepatitis B vaccine.** / Consult your health care provider.  Haemophilus influenzae type b (Hib) vaccine.** / Consult your health care provider. ** Family history and personal history of risk and conditions may change your health care provider's recommendations. Document Released: 06/01/2001 Document Revised: 08/20/2013 Document Reviewed: 08/31/2010 [facility] Patient Information 2015 [place], [place]. This information is not intended to replace advice given to you by your health care provider. Make sure you discuss any questions you have with your health care provider.

## 2013-12-03 ENCOUNTER — Other Ambulatory Visit (INDEPENDENT_AMBULATORY_CARE_PROVIDER_SITE_OTHER): Payer: Managed Care, Other (non HMO)

## 2013-12-03 DIAGNOSIS — Z0000 Encounter for general adult medical examination without abnormal findings: Secondary | ICD-10-CM

## 2013-12-03 DIAGNOSIS — I10 Essential (primary) hypertension: Secondary | ICD-10-CM

## 2013-12-03 LAB — CBC WITH DIFFERENTIAL/PLATELET
BASOS PCT: 0.7 % (ref 0.0–3.0)
Basophils Absolute: 0 10*3/uL (ref 0.0–0.1)
Eosinophils Absolute: 0.3 10*3/uL (ref 0.0–0.7)
Eosinophils Relative: 6.1 % — ABNORMAL HIGH (ref 0.0–5.0)
HCT: 34.4 % — ABNORMAL LOW (ref 36.0–46.0)
HEMOGLOBIN: 11.5 g/dL — AB (ref 12.0–15.0)
LYMPHS PCT: 33.7 % (ref 12.0–46.0)
Lymphs Abs: 1.4 10*3/uL (ref 0.7–4.0)
MCHC: 33.5 g/dL (ref 30.0–36.0)
MCV: 85.3 fl (ref 78.0–100.0)
MONOS PCT: 7.9 % (ref 3.0–12.0)
Monocytes Absolute: 0.3 10*3/uL (ref 0.1–1.0)
NEUTROS ABS: 2.2 10*3/uL (ref 1.4–7.7)
Neutrophils Relative %: 51.6 % (ref 43.0–77.0)
Platelets: 273 10*3/uL (ref 150.0–400.0)
RBC: 4.03 Mil/uL (ref 3.87–5.11)
RDW: 13.8 % (ref 11.5–15.5)
WBC: 4.3 10*3/uL (ref 4.0–10.5)

## 2013-12-03 LAB — POCT URINALYSIS DIPSTICK
BILIRUBIN UA: NEGATIVE
Blood, UA: NEGATIVE
GLUCOSE UA: NEGATIVE
Ketones, UA: NEGATIVE
Leukocytes, UA: NEGATIVE
NITRITE UA: NEGATIVE
PH UA: 5
Protein, UA: NEGATIVE
Spec Grav, UA: >=1.03
Urobilinogen, UA: 0.2

## 2013-12-03 LAB — HEPATIC FUNCTION PANEL
ALBUMIN: 3.5 g/dL (ref 3.5–5.2)
ALT: 15 U/L (ref 0–35)
AST: 16 U/L (ref 0–37)
Alkaline Phosphatase: 51 U/L (ref 39–117)
Bilirubin, Direct: 0 mg/dL (ref 0.0–0.3)
TOTAL PROTEIN: 6.7 g/dL (ref 6.0–8.3)
Total Bilirubin: 0.5 mg/dL (ref 0.2–1.2)

## 2013-12-03 LAB — BASIC METABOLIC PANEL
BUN: 22 mg/dL (ref 6–23)
CALCIUM: 8.7 mg/dL (ref 8.4–10.5)
CO2: 27 mEq/L (ref 19–32)
Chloride: 106 mEq/L (ref 96–112)
Creatinine, Ser: 1 mg/dL (ref 0.4–1.2)
GFR: 72.93 mL/min (ref >60.00)
Glucose, Bld: 74 mg/dL (ref 70–99)
POTASSIUM: 3.5 meq/L (ref 3.5–5.1)
Sodium: 142 mEq/L (ref 135–145)

## 2013-12-03 LAB — LIPID PANEL
Cholesterol: 161 mg/dL (ref 0–200)
HDL: 58.1 mg/dL (ref >39.00)
LDL Cholesterol: 88 mg/dL (ref 0–99)
NONHDL: 102.9
Total CHOL/HDL Ratio: 3
Triglycerides: 73 mg/dL (ref 0.0–149.0)
VLDL: 14.6 mg/dL (ref 0.0–40.0)

## 2013-12-03 LAB — TSH: TSH: 1.76 u[IU]/mL (ref 0.35–4.50)

## 2013-12-06 ENCOUNTER — Ambulatory Visit (HOSPITAL_COMMUNITY): Payer: Managed Care, Other (non HMO) | Attending: Family Medicine | Admitting: Radiology

## 2013-12-06 ENCOUNTER — Ambulatory Visit (HOSPITAL_BASED_OUTPATIENT_CLINIC_OR_DEPARTMENT_OTHER): Payer: Managed Care, Other (non HMO) | Admitting: Cardiology

## 2013-12-06 ENCOUNTER — Encounter (HOSPITAL_COMMUNITY): Payer: Managed Care, Other (non HMO)

## 2013-12-06 DIAGNOSIS — I10 Essential (primary) hypertension: Secondary | ICD-10-CM

## 2013-12-06 DIAGNOSIS — R011 Cardiac murmur, unspecified: Secondary | ICD-10-CM | POA: Diagnosis not present

## 2013-12-06 DIAGNOSIS — R0989 Other specified symptoms and signs involving the circulatory and respiratory systems: Secondary | ICD-10-CM

## 2013-12-06 NOTE — Progress Notes (Signed)
Carotid duplex performed 

## 2013-12-06 NOTE — Progress Notes (Signed)
Echocardiogram performed.  

## 2014-01-01 ENCOUNTER — Other Ambulatory Visit: Payer: Self-pay | Admitting: Obstetrics & Gynecology

## 2014-01-01 LAB — HM MAMMOGRAPHY

## 2014-01-02 LAB — CYTOLOGY - PAP

## 2014-02-09 ENCOUNTER — Other Ambulatory Visit: Payer: Self-pay | Admitting: Family Medicine

## 2014-03-12 ENCOUNTER — Other Ambulatory Visit: Payer: Self-pay | Admitting: Family Medicine

## 2014-04-25 ENCOUNTER — Other Ambulatory Visit: Payer: Self-pay | Admitting: Family Medicine

## 2014-05-21 ENCOUNTER — Other Ambulatory Visit: Payer: Self-pay | Admitting: Family Medicine

## 2014-07-04 ENCOUNTER — Other Ambulatory Visit: Payer: Self-pay | Admitting: Family Medicine

## 2014-07-16 ENCOUNTER — Other Ambulatory Visit: Payer: Self-pay | Admitting: Family Medicine

## 2014-07-22 ENCOUNTER — Other Ambulatory Visit: Payer: Self-pay | Admitting: Family Medicine

## 2014-07-27 ENCOUNTER — Other Ambulatory Visit: Payer: Self-pay | Admitting: Family Medicine

## 2014-08-08 ENCOUNTER — Ambulatory Visit (INDEPENDENT_AMBULATORY_CARE_PROVIDER_SITE_OTHER): Payer: Managed Care, Other (non HMO) | Admitting: Family Medicine

## 2014-08-08 ENCOUNTER — Encounter: Payer: Self-pay | Admitting: Family Medicine

## 2014-08-08 VITALS — BP 124/82 | HR 77 | Temp 98.1°F | Wt 301.0 lb

## 2014-08-08 DIAGNOSIS — J321 Chronic frontal sinusitis: Secondary | ICD-10-CM | POA: Diagnosis not present

## 2014-08-08 DIAGNOSIS — M150 Primary generalized (osteo)arthritis: Secondary | ICD-10-CM | POA: Diagnosis not present

## 2014-08-08 DIAGNOSIS — B354 Tinea corporis: Secondary | ICD-10-CM | POA: Diagnosis not present

## 2014-08-08 DIAGNOSIS — I10 Essential (primary) hypertension: Secondary | ICD-10-CM

## 2014-08-08 DIAGNOSIS — R1011 Right upper quadrant pain: Secondary | ICD-10-CM | POA: Diagnosis not present

## 2014-08-08 DIAGNOSIS — M549 Dorsalgia, unspecified: Secondary | ICD-10-CM

## 2014-08-08 DIAGNOSIS — R0789 Other chest pain: Secondary | ICD-10-CM

## 2014-08-08 DIAGNOSIS — R1013 Epigastric pain: Secondary | ICD-10-CM

## 2014-08-08 DIAGNOSIS — M159 Polyosteoarthritis, unspecified: Secondary | ICD-10-CM

## 2014-08-08 LAB — POCT URINALYSIS DIPSTICK
Bilirubin, UA: NEGATIVE
Blood, UA: NEGATIVE
GLUCOSE UA: NEGATIVE
Ketones, UA: NEGATIVE
Leukocytes, UA: NEGATIVE
Nitrite, UA: NEGATIVE
Spec Grav, UA: >=1.03
UROBILINOGEN UA: 4
pH, UA: 6

## 2014-08-08 MED ORDER — FLUTICASONE PROPIONATE 50 MCG/ACT NA SUSP: 2.0000 | Freq: Every day | NASAL | Status: DC

## 2014-08-08 MED ORDER — LOSARTAN POTASSIUM-HCTZ 100-25 MG PO TABS: 1.0000 | ORAL_TABLET | Freq: Every day | ORAL | Status: DC

## 2014-08-08 MED ORDER — AMLODIPINE BESYLATE 10 MG PO TABS: 10.0000 mg | ORAL_TABLET | Freq: Every day | ORAL | Status: DC

## 2014-08-08 MED ORDER — NAFTIFINE HCL 1 % EX CREA
TOPICAL_CREAM | Freq: Every day | CUTANEOUS | Status: DC
Start: 2014-08-08 — End: 2014-08-09

## 2014-08-08 MED ORDER — HYDROCODONE-ACETAMINOPHEN 5-325 MG PO TABS: 1.0000 | ORAL_TABLET | Freq: Four times a day (QID) | ORAL | Status: DC | PRN

## 2014-08-08 MED ORDER — CYCLOBENZAPRINE HCL 10 MG PO TABS: 10.0000 mg | ORAL_TABLET | Freq: Three times a day (TID) | ORAL | Status: DC | PRN

## 2014-08-08 MED ORDER — OMEPRAZOLE 20 MG PO CPDR: 20.0000 mg | DELAYED_RELEASE_CAPSULE | Freq: Every day | ORAL | Status: DC

## 2014-08-08 NOTE — Patient Instructions (Signed)

## 2014-08-08 NOTE — Progress Notes (Signed)
Pre visit review using our clinic review tool, if applicable. No additional management support is needed unless otherwise documented below in the visit note. 

## 2014-08-08 NOTE — Progress Notes (Signed)
Patient ID: [name], female    DOB: Oct 20, 1960  Age: 54 y.o. MRN: [id number]    Subjective:  Subjective HPI [name] presents for f/u htn and c/o mid back pain and underneath r shoulder blade.  + sob and chest pain--- last several seconds.    Review of Systems  Constitutional: Negative for activity change, appetite change, fatigue and unexpected weight change.  Respiratory: Negative for cough and shortness of breath.   Cardiovascular: Negative for chest pain and palpitations.  Gastrointestinal: Positive for abdominal pain.  Psychiatric/Behavioral: Negative for behavioral problems and dysphoric mood. The patient is not nervous/anxious.     History Past Medical History  Diagnosis Date  . Hypertension   . Post-menopausal   . Left shoulder pain   . Diverticulosis of colon (without mention of hemorrhage)   . Abdominal pain, other specified site   . Hypokalemia   . Benign neoplasm of skin, site unspecified   . Family history of ischemic heart disease   . Family history of malignant neoplasm of genital organ, other   . Disturbance of skin sensation   . Sprain and strain of unspecified site of knee and leg   . Sprain of neck   . Acute upper respiratory infections of unspecified site   . Family history of diabetes mellitus   . Unspecified essential hypertension   . GERD (gastroesophageal reflux disease)     She has past surgical history that includes Total abdominal hysterectomy w/ bilateral salpingoophorectomy (2002); Bunionectomy (2005); Shoulder surgery; and Abdominal hysterectomy.   Her family history includes Dementia in her mother; Diabetes in her brother and mother; Hypertension in her father, mother, and sister; Prostate cancer in her father; Stroke in her maternal aunt and mother. There is no history of Colon cancer or Colon cancer.She reports that she has never smoked. She has never used smokeless tobacco. She reports that she does not drink alcohol or use illicit  drugs.  Current Outpatient Prescriptions on File Prior to Visit  Medication Sig Dispense Refill  . estradiol (ESTRACE) 2 MG tablet Take 1 mg by mouth as needed.     . hydroxychloroquine (PLAQUENIL) 200 MG tablet Take 200 mg by mouth 2 (two) times daily.      Marland Kitchen KLOR-CON M10 10 MEQ tablet Take 2 tablets (20 mEq total) by mouth 2 (two) times daily. 120 tablet 1  . naproxen (NAPROSYN) 500 MG tablet TAKE 1 TABLET (500 MG TOTAL) BY MOUTH 2 (TWO) TIMES DAILY BETWEEN MEALS AS NEEDED. 60 tablet 0  . omeprazole (PRILOSEC) 40 MG capsule TAKE 1 CAPSULE BY MOUTH DAILY ON EMPTY STOMACH 30 capsule 5  . RESTASIS 0.05 % ophthalmic emulsion Place 1 drop into both eyes daily.      No current facility-administered medications on file prior to visit.     Objective:  Objective Physical Exam  Constitutional: She is oriented to person, place, and time. She appears well-developed and well-nourished. No distress.  HENT:  Head: Normocephalic and atraumatic.  Right Ear: External ear normal.  Left Ear: External ear normal.  Nose: Nose normal.  Mouth/Throat: Oropharynx is clear and moist.  Eyes: Conjunctivae and EOM are normal. Pupils are equal, round, and reactive to light.  Neck: Normal range of motion. Neck supple. No thyromegaly present.  Cardiovascular: Normal rate, regular rhythm, normal heart sounds and intact distal pulses.   No murmur heard. Pulmonary/Chest: Effort normal and breath sounds normal. No respiratory distress. She has no wheezes. She has no rales. She  exhibits no tenderness.  Abdominal: Soft. She exhibits no distension. There is tenderness. There is no rebound and no guarding.    Musculoskeletal: She exhibits no edema.  Lymphadenopathy:    She has no cervical adenopathy.  Neurological: She is alert and oriented to person, place, and time.  Skin: Skin is warm and dry.  Psychiatric: She has a normal mood and affect. Her behavior is normal. Judgment and thought content normal.   BP 124/82  mmHg  Pulse 77  Temp(Src) 98.1 F (36.7 C) (Oral)  Wt 301 lb (136.533 kg)  SpO2 97% Wt Readings from Last 3 Encounters:  08/08/14 301 lb (136.533 kg)  11/28/13 287 lb (130.182 kg)  08/28/13 291 lb 12.8 oz (132.36 kg)     Lab Results  Component Value Date   WBC 4.3 12/03/2013   HGB 11.5* 12/03/2013   HCT 34.4* 12/03/2013   PLT 273.0 12/03/2013   GLUCOSE 74 12/03/2013   CHOL 161 12/03/2013   TRIG 73.0 12/03/2013   HDL 58.10 12/03/2013   LDLDIRECT 128.5 04/30/2008   LDLCALC 88 12/03/2013   ALT 15 12/03/2013   AST 16 12/03/2013   NA 142 12/03/2013   K 3.5 12/03/2013   CL 106 12/03/2013   CREATININE 1.0 12/03/2013   BUN 22 12/03/2013   CO2 27 12/03/2013   TSH 1.76 12/03/2013    US Breast Left  08/09/2011   *RADIOLOGY REPORT*  Clinical Data:  Recall from screening mammography.  DIGITAL DIAGNOSTIC LEFT BREAST MAMMOGRAM  AND LEFT BREAST ULTRASOUND:  Comparison:  08/04/2011, 07/01/2010, 06/25/2009, 05/15/2008.  Findings:  Additional views of the left breast demonstrate a partially obscured oval mass to be located within the left breast at approximately the 12 o'clock position.  There is a stable benign intramammary lymph node located within the upper-outer quadrant of the left breast.  On physical exam, there is no discrete palpable abnormality within the superior left breast.  Ultrasound is performed, showing a simple cyst measuring 10 mm in size located within the left breast at 12 o'clock position 5 cm from the nipple corresponding to the mammographic finding.  IMPRESSION: 1 cm simple cyst located within the left breast at 12 o'clock position 5 cm from nipple.  No findings worrisome for malignancy. Recommend screening mammography in 1 year.  BI-RADS CATEGORY 2:  Benign finding(s).  Original Report Authenticated By: Altamese Cabal, M.D.  Mm Digital Diagnostic Unilat L  08/09/2011   *RADIOLOGY REPORT*  Clinical Data:  Recall from screening mammography.  DIGITAL DIAGNOSTIC LEFT  BREAST MAMMOGRAM  AND LEFT BREAST ULTRASOUND:  Comparison:  08/04/2011, 07/01/2010, 06/25/2009, 05/15/2008.  Findings:  Additional views of the left breast demonstrate a partially obscured oval mass to be located within the left breast at approximately the 12 o'clock position.  There is a stable benign intramammary lymph node located within the upper-outer quadrant of the left breast.  On physical exam, there is no discrete palpable abnormality within the superior left breast.  Ultrasound is performed, showing a simple cyst measuring 10 mm in size located within the left breast at 12 o'clock position 5 cm from the nipple corresponding to the mammographic finding.  IMPRESSION: 1 cm simple cyst located within the left breast at 12 o'clock position 5 cm from nipple.  No findings worrisome for malignancy. Recommend screening mammography in 1 year.  BI-RADS CATEGORY 2:  Benign finding(s).  Original Report Authenticated By: Altamese Cabal, M.D.    Assessment & Plan:  Plan I have discontinued Ms. Mednick Naproxen  Sodium (ALEVE PO). I have also changed her fluticasone. Additionally, I am having her start on cyclobenzaprine and omeprazole. Lastly, I am having her maintain her estradiol, hydroxychloroquine, RESTASIS, naproxen, omeprazole, KLOR-CON M10, amLODipine, HYDROcodone-acetaminophen, losartan-hydrochlorothiazide, and naftifine.  Meds ordered this encounter  Medications  . amLODipine (NORVASC) 10 MG tablet    Sig: Take 1 tablet (10 mg total) by mouth daily.    Dispense:  90 tablet    Refill:  3    D/C PREVIOUS SCRIPTS FOR THIS MEDICATION  . fluticasone (FLONASE) 50 MCG/ACT nasal spray    Sig: Place 2 sprays into both nostrils daily.    Dispense:  16 g    Refill:  2    D/C PREVIOUS SCRIPTS FOR THIS MEDICATION  . HYDROcodone-acetaminophen (NORCO/VICODIN) 5-325 MG per tablet    Sig: Take 1 tablet by mouth every 6 (six) hours as needed for moderate pain.    Dispense:  30 tablet    Refill:  0  .  losartan-hydrochlorothiazide (HYZAAR) 100-25 MG per tablet    Sig: Take 1 tablet by mouth daily.    Dispense:  30 tablet    Refill:  3    D/C PREVIOUS SCRIPTS FOR THIS MEDICATION  . naftifine (NAFTIN) 1 % cream    Sig: Apply topically daily.    Dispense:  30 g    Refill:  0  . cyclobenzaprine (FLEXERIL) 10 MG tablet    Sig: Take 1 tablet (10 mg total) by mouth 3 (three) times daily as needed for muscle spasms.    Dispense:  30 tablet    Refill:  0  . omeprazole (PRILOSEC) 20 MG capsule    Sig: Take 1 capsule (20 mg total) by mouth daily.    Dispense:  30 capsule    Refill:  3    Problem List Items Addressed This Visit    None    Visit Diagnoses    Essential hypertension    -  Primary    Relevant Medications    amLODipine (NORVASC) 10 MG tablet    losartan-hydrochlorothiazide (HYZAAR) 100-25 MG per tablet    Other Relevant Orders    Basic metabolic panel    CBC with Differential/Platelet    Hepatic function panel    Lipid panel    POCT urinalysis dipstick (Completed)    TSH    Frontal sinusitis, unspecified chronicity        Relevant Medications    fluticasone (FLONASE) 50 MCG/ACT nasal spray    Tinea corporis        Relevant Medications    naftifine (NAFTIN) 1 % cream    Primary osteoarthritis involving multiple joints        Relevant Medications    HYDROcodone-acetaminophen (NORCO/VICODIN) 5-325 MG per tablet    cyclobenzaprine (FLEXERIL) 10 MG tablet    Mid back pain        Relevant Medications    HYDROcodone-acetaminophen (NORCO/VICODIN) 5-325 MG per tablet    cyclobenzaprine (FLEXERIL) 10 MG tablet    RUQ pain        Relevant Orders    US Abdomen Complete    Basic metabolic panel    CBC with Differential/Platelet    Hepatic function panel    Lipid panel    POCT urinalysis dipstick (Completed)    TSH    H. pylori antibody, IgG    Dyspepsia        Relevant Medications    omeprazole (PRILOSEC) 20 MG capsule    Other chest  pain        Relevant Orders     EKG 12-Lead (Completed)    Myocardial Perfusion Imaging       Follow-up: Return in about 6 months (around 02/07/2015), or if symptoms worsen or fail to improve, for f/u and labs.  Garnet Koyanagi, DO

## 2014-08-09 ENCOUNTER — Other Ambulatory Visit: Payer: Self-pay

## 2014-08-09 DIAGNOSIS — B354 Tinea corporis: Secondary | ICD-10-CM

## 2014-08-09 LAB — CBC WITH DIFFERENTIAL/PLATELET
BASOS PCT: 0.5 % (ref 0.0–3.0)
Basophils Absolute: 0 10*3/uL (ref 0.0–0.1)
EOS ABS: 0.2 10*3/uL (ref 0.0–0.7)
EOS PCT: 4.2 % (ref 0.0–5.0)
HCT: 34.6 % — ABNORMAL LOW (ref 36.0–46.0)
HEMOGLOBIN: 11.6 g/dL — AB (ref 12.0–15.0)
Lymphocytes Relative: 35.7 % (ref 12.0–46.0)
Lymphs Abs: 1.6 10*3/uL (ref 0.7–4.0)
MCHC: 33.5 g/dL (ref 30.0–36.0)
MCV: 83.2 fl (ref 78.0–100.0)
Monocytes Absolute: 0.3 10*3/uL (ref 0.1–1.0)
Monocytes Relative: 6.1 % (ref 3.0–12.0)
NEUTROS ABS: 2.4 10*3/uL (ref 1.4–7.7)
NEUTROS PCT: 53.5 % (ref 43.0–77.0)
Platelets: 273 10*3/uL (ref 150.0–400.0)
RBC: 4.16 Mil/uL (ref 3.87–5.11)
RDW: 13.9 % (ref 11.5–15.5)
WBC: 4.4 10*3/uL (ref 4.0–10.5)

## 2014-08-09 LAB — BASIC METABOLIC PANEL
BUN: 17 mg/dL (ref 6–23)
CO2: 25 meq/L (ref 19–32)
CREATININE: 0.84 mg/dL (ref 0.40–1.20)
Calcium: 9 mg/dL (ref 8.4–10.5)
Chloride: 109 mEq/L (ref 96–112)
GFR: 91.01 mL/min (ref >60.00)
GLUCOSE: 72 mg/dL (ref 70–99)
Potassium: 3.7 mEq/L (ref 3.5–5.1)
Sodium: 141 mEq/L (ref 135–145)

## 2014-08-09 LAB — LIPID PANEL
Cholesterol: 164 mg/dL (ref 0–200)
HDL: 53.1 mg/dL (ref >39.00)
LDL Cholesterol: 98 mg/dL (ref 0–99)
NONHDL: 110.9
Total CHOL/HDL Ratio: 3
Triglycerides: 63 mg/dL (ref 0.0–149.0)
VLDL: 12.6 mg/dL (ref 0.0–40.0)

## 2014-08-09 LAB — TSH: TSH: 1.99 u[IU]/mL (ref 0.35–4.50)

## 2014-08-09 LAB — HEPATIC FUNCTION PANEL
ALT: 12 U/L (ref 0–35)
AST: 13 U/L (ref 0–37)
Albumin: 3.8 g/dL (ref 3.5–5.2)
Alkaline Phosphatase: 59 U/L (ref 39–117)
BILIRUBIN DIRECT: 0.1 mg/dL (ref 0.0–0.3)
Total Bilirubin: 0.6 mg/dL (ref 0.2–1.2)
Total Protein: 7 g/dL (ref 6.0–8.3)

## 2014-08-09 LAB — H. PYLORI ANTIBODY, IGG: H Pylori IgG: NEGATIVE

## 2014-08-09 MED ORDER — NAFTIFINE HCL 1 % EX CREA: TOPICAL_CREAM | Freq: Every day | CUTANEOUS | Status: DC

## 2014-08-15 ENCOUNTER — Ambulatory Visit (HOSPITAL_BASED_OUTPATIENT_CLINIC_OR_DEPARTMENT_OTHER)
Admission: RE | Admit: 2014-08-15 | Discharge: 2014-08-15 | Disposition: A | Discharge status: Home / Self Care | Payer: Managed Care, Other (non HMO) | Source: Ambulatory Visit | Attending: Family Medicine | Admitting: Family Medicine

## 2014-08-15 DIAGNOSIS — R1011 Right upper quadrant pain: Secondary | ICD-10-CM | POA: Insufficient documentation

## 2014-08-21 ENCOUNTER — Telehealth: Payer: Self-pay | Admitting: *Deleted

## 2014-08-21 NOTE — Telephone Encounter (Signed)
Prior authorization for omeprazole initiated through [place]. Awaiting determination. JG//CMA

## 2014-09-25 ENCOUNTER — Encounter: Payer: Self-pay | Admitting: Behavioral Health

## 2014-10-08 ENCOUNTER — Telehealth: Payer: Self-pay | Admitting: *Deleted

## 2014-10-08 NOTE — Telephone Encounter (Signed)
Opened in error

## 2014-12-13 ENCOUNTER — Other Ambulatory Visit: Payer: Self-pay

## 2014-12-13 DIAGNOSIS — R1013 Epigastric pain: Secondary | ICD-10-CM

## 2014-12-13 MED ORDER — OMEPRAZOLE 20 MG PO CPDR: 20.0000 mg | DELAYED_RELEASE_CAPSULE | Freq: Every day | ORAL | Status: DC

## 2015-01-08 ENCOUNTER — Other Ambulatory Visit: Payer: Self-pay | Admitting: Obstetrics & Gynecology

## 2015-01-09 ENCOUNTER — Telehealth: Payer: Self-pay

## 2015-01-09 LAB — CYTOLOGY - PAP

## 2015-01-09 MED ORDER — KLOR-CON M10 10 MEQ PO TBCR: 20.0000 meq | EXTENDED_RELEASE_TABLET | Freq: Two times a day (BID) | ORAL | Status: DC

## 2015-01-09 NOTE — Telephone Encounter (Signed)
Rx faxed.    KP 

## 2015-01-22 ENCOUNTER — Other Ambulatory Visit: Payer: Self-pay | Admitting: Family Medicine

## 2015-02-17 ENCOUNTER — Encounter: Payer: Self-pay | Admitting: Internal Medicine

## 2015-03-03 ENCOUNTER — Ambulatory Visit (INDEPENDENT_AMBULATORY_CARE_PROVIDER_SITE_OTHER): Payer: Managed Care, Other (non HMO) | Admitting: Podiatry

## 2015-03-03 ENCOUNTER — Encounter: Payer: Self-pay | Admitting: Podiatry

## 2015-03-03 ENCOUNTER — Ambulatory Visit (INDEPENDENT_AMBULATORY_CARE_PROVIDER_SITE_OTHER): Payer: Managed Care, Other (non HMO)

## 2015-03-03 VITALS — BP 111/69 | HR 69 | Resp 16 | Ht 68.0 in | Wt 297.0 lb

## 2015-03-03 DIAGNOSIS — M7660 Achilles tendinitis, unspecified leg: Secondary | ICD-10-CM

## 2015-03-03 DIAGNOSIS — M722 Plantar fascial fibromatosis: Secondary | ICD-10-CM

## 2015-03-03 MED ORDER — DICLOFENAC SODIUM 75 MG PO TBEC: 75.0000 mg | DELAYED_RELEASE_TABLET | Freq: Two times a day (BID) | ORAL | Status: DC

## 2015-03-03 NOTE — Progress Notes (Signed)
Subjective:     Patient ID: [name], female   DOB: November 01, 1960, 54 y.o.   MRN: [id number]  HPI patient states I'm having a lot of pain in the back of my heel left over right with inflammation noted and I do not recommend any specific injury that may have occurred   Review of Systems  All other systems reviewed and are negative.      Objective:   Physical Exam  Constitutional: She is oriented to person, place, and time.  Cardiovascular: Intact distal pulses.   Musculoskeletal: Normal range of motion.  Neurological: She is oriented to person, place, and time.  Skin: Skin is warm.  Vitals reviewed.  neurovascular status intact with muscle strength adequate and range of motion within normal limits. Patient's noted to have significant discomfort in the posterior heel region left over right at the muscle tendon junction with moderate equinus condition noted but no indication of muscle strength loss. Patient has good digital perfusion and is well oriented 3     Assessment:     Achilles tendinitis left over right at the muscle tendon junction with inflammation and fluid buildup noted    Plan:     H&P condition reviewed with patient. At this point where to treat conservatively and I went ahead and I applied a walking boot to the left lower leg to immobilize the Achilles tendon and advised on heat and ice therapy. Patient will be seen back for [place] to recheck again in 3 weeks and was given instructions for boot usage and stretching exercises for the right posterior

## 2015-03-03 NOTE — Progress Notes (Signed)
   Subjective:    Patient ID: [name], female    DOB: 1960/06/24, 54 y.o.   MRN: [id number]  HPI Pt presents with bilateral foot pain, back of heel. Pt stated, "left foot hurts more than the right foot"; x4 months.   Review of Systems  All other systems reviewed and are negative.      Objective:   Physical Exam        Assessment & Plan:

## 2015-03-03 NOTE — Patient Instructions (Signed)

## 2015-03-04 DIAGNOSIS — M79673 Pain in unspecified foot: Secondary | ICD-10-CM

## 2015-03-19 ENCOUNTER — Other Ambulatory Visit: Payer: Self-pay | Admitting: Family Medicine

## 2015-03-19 NOTE — Telephone Encounter (Signed)
Medication filled to pharmacy as requested.   

## 2015-04-22 ENCOUNTER — Ambulatory Visit (INDEPENDENT_AMBULATORY_CARE_PROVIDER_SITE_OTHER): Payer: Self-pay | Admitting: Family Medicine

## 2015-04-22 ENCOUNTER — Encounter: Payer: Self-pay | Admitting: Family Medicine

## 2015-04-22 VITALS — BP 110/74 | HR 98 | Temp 98.7°F | Wt 297.0 lb

## 2015-04-22 DIAGNOSIS — B349 Viral infection, unspecified: Secondary | ICD-10-CM

## 2015-04-22 DIAGNOSIS — J329 Chronic sinusitis, unspecified: Secondary | ICD-10-CM

## 2015-04-22 DIAGNOSIS — B9789 Other viral agents as the cause of diseases classified elsewhere: Secondary | ICD-10-CM

## 2015-04-22 MED ORDER — AMOXICILLIN-POT CLAVULANATE 875-125 MG PO TABS: 1.0000 | ORAL_TABLET | Freq: Two times a day (BID) | ORAL | Status: DC

## 2015-04-22 NOTE — Progress Notes (Signed)
PCP: Pcp Not In System  Subjective:  [name] is a 55 y.o. year old very pleasant female patient who presents with sinusitis symptoms including nasal congestion, sinus tenderness -other symptoms include: cough, green discharge from nose and in cough -day of illness:5 -started: last Thursday/Friday -Symptoms show no change, except drainage is starting to clear up some -previous treatments: rest, hydration, ibuprofen for sinus pain -sick contacts/travel/risks: denies flu exposure.   ROS-denies fever, SOB, NVD. Endorses maxillary sinus pain  Pertinent Past Medical History- severe obesity, RA on plaquenil through Dr. Amil Amen, HLD, hypertension  Medications- reviewed  Current Outpatient Prescriptions  Medication Sig Dispense Refill  . amLODipine (NORVASC) 10 MG tablet Take 1 tablet (10 mg total) by mouth daily. 90 tablet 3  . Cyanocobalamin (VITAMIN B-12 PO) Take 1 tablet by mouth daily.    . diclofenac (VOLTAREN) 75 MG EC tablet Take 1 tablet (75 mg total) by mouth 2 (two) times daily. 50 tablet 2  . estradiol (ESTRACE) 2 MG tablet Take 1 mg by mouth as needed.     . fluticasone (FLONASE) 50 MCG/ACT nasal spray Place 2 sprays into both nostrils daily. 16 g 2  . hydroxychloroquine (PLAQUENIL) 200 MG tablet Take 200 mg by mouth 2 (two) times daily.      . IBUPROFEN IB PO Take by mouth as needed. Pt takes Aleve    . KLOR-CON M10 10 MEQ tablet TAKE TWO TABLETS (20 MEQ) BY MOUTH TWO TIMES A DAY. 120 tablet 1  . losartan-hydrochlorothiazide (HYZAAR) 100-25 MG tablet TAKE ONE TABLET BY MOUTH DAILY. 30 tablet 5  . naftifine (NAFTIN) 1 % cream Apply topically daily. 60 g 0  . naproxen (NAPROSYN) 500 MG tablet TAKE 1 TABLET (500 MG TOTAL) BY MOUTH 2 (TWO) TIMES DAILY BETWEEN MEALS AS NEEDED. 60 tablet 0  . omeprazole (PRILOSEC) 20 MG capsule Take 1 capsule (20 mg total) by mouth daily. 30 capsule 11  . RESTASIS 0.05 % ophthalmic emulsion Place 1 drop into both eyes daily.     . cyclobenzaprine  (FLEXERIL) 10 MG tablet Take 1 tablet (10 mg total) by mouth 3 (three) times daily as needed for muscle spasms. (Patient not taking: Reported on 04/22/2015) 30 tablet 0  . HYDROcodone-acetaminophen (NORCO/VICODIN) 5-325 MG per tablet Take 1 tablet by mouth every 6 (six) hours as needed for moderate pain. (Patient not taking: Reported on 04/22/2015) 30 tablet 0   No current facility-administered medications for this visit.    Objective: BP 110/74 mmHg  Pulse 98  Temp(Src) 98.7 F (37.1 C)  Wt 297 lb (134.718 kg)  SpO2 96% Gen: NAD, resting comfortably HEENT: Turbinates erythematous with yellow drainage, TM normal, pharynx mildly erythematous with no tonsilar exudate or edema, maxillary bilateral sinus tenderness CV: RRR no murmurs rubs or gallops Lungs: CTAB no crackles, wheeze, rhonchi Abdomen: soft/nontender/nondistended/normal bowel sounds. No rebound or guarding. Morbidly obese Ext: no edema Skin: warm, dry, no rash Neuro: grossly normal, moves all extremities  Assessment/Plan:  Sinsusitis Viral based on <10 days, no double sickening, lack of severity of symptoms in first 3 days. We also discussed reasons why current illness does not meet criteria for bacterial illness and therefore no antibiotics indicated. Also educated on signs that bacterial infection may have developed.   Treatment: -considered prednisone: patient opted out. We were worried about worsening obesity -other symptomatic care with rest- stay out of work tomorroow -Antibiotic indicated: no but My concern is that patient is on plaquenil and may not be able  to mount response to bacterial superinfection. We were cautious and will allow antibiotic use at day 7 instead of waiting until day 10.   Finally, we reviewed reasons to return to care including if symptoms worsen or persist or new concerns arise.  Meds ordered this encounter  Medications  . amoxicillin-clavulanate (AUGMENTIN) 875-125 MG tablet    Sig: Take 1  tablet by mouth 2 (two) times daily.    Dispense:  14 tablet    Refill:  0

## 2015-04-22 NOTE — Patient Instructions (Signed)
Suspect viral sinus infection but given you are on plaquenil want to be a bit more proactive. If symptoms do not continue to improve over next 2 days, may take antibiotic or may take if fever or worsening symptoms.   Follow up if symptoms do not resolve after antibiotic

## 2015-05-05 ENCOUNTER — Ambulatory Visit: Payer: Managed Care, Other (non HMO) | Admitting: Podiatry

## 2015-05-08 ENCOUNTER — Ambulatory Visit (INDEPENDENT_AMBULATORY_CARE_PROVIDER_SITE_OTHER): Payer: Managed Care, Other (non HMO) | Admitting: Podiatry

## 2015-05-08 ENCOUNTER — Encounter: Payer: Self-pay | Admitting: Podiatry

## 2015-05-08 DIAGNOSIS — M7660 Achilles tendinitis, unspecified leg: Secondary | ICD-10-CM | POA: Diagnosis not present

## 2015-05-08 DIAGNOSIS — M722 Plantar fascial fibromatosis: Secondary | ICD-10-CM

## 2015-05-08 NOTE — Progress Notes (Signed)
Subjective:     Patient ID: [name], female   DOB: 02-27-1961, 55 y.o.   MRN: [id number]  HPIpatient presents stating I'm improved but I still gets some pain with certain types of shoes on my left Achilles   Review of Systems     Objective:   Physical Exam Neurovascular status intact muscle strength adequate with mild discomfort on the posterior aspect left heel with significant relief from previous visit    Assessment:     Achilles tendinitis left improving    Plan:     Silicone sheath was administered and instructions on continued stretching and boot usage as needed

## 2015-07-10 ENCOUNTER — Other Ambulatory Visit: Payer: Self-pay | Admitting: Family Medicine

## 2015-08-11 ENCOUNTER — Encounter: Payer: Self-pay | Admitting: Family Medicine

## 2015-08-11 ENCOUNTER — Ambulatory Visit (INDEPENDENT_AMBULATORY_CARE_PROVIDER_SITE_OTHER): Payer: Managed Care, Other (non HMO) | Admitting: Family Medicine

## 2015-08-11 VITALS — BP 114/79 | HR 73 | Temp 97.9°F | Wt 295.6 lb

## 2015-08-11 DIAGNOSIS — M1711 Unilateral primary osteoarthritis, right knee: Secondary | ICD-10-CM | POA: Diagnosis not present

## 2015-08-11 DIAGNOSIS — E785 Hyperlipidemia, unspecified: Secondary | ICD-10-CM

## 2015-08-11 DIAGNOSIS — Z1159 Encounter for screening for other viral diseases: Secondary | ICD-10-CM

## 2015-08-11 DIAGNOSIS — I10 Essential (primary) hypertension: Secondary | ICD-10-CM | POA: Diagnosis not present

## 2015-08-11 DIAGNOSIS — M150 Primary generalized (osteo)arthritis: Secondary | ICD-10-CM

## 2015-08-11 DIAGNOSIS — M159 Polyosteoarthritis, unspecified: Secondary | ICD-10-CM

## 2015-08-11 LAB — POCT URINALYSIS DIPSTICK
Bilirubin, UA: NEGATIVE
Blood, UA: NEGATIVE
GLUCOSE UA: NEGATIVE
LEUKOCYTES UA: NEGATIVE
Nitrite, UA: NEGATIVE
Spec Grav, UA: >=1.03
Urobilinogen, UA: 0.2
pH, UA: 6

## 2015-08-11 LAB — CBC WITH DIFFERENTIAL/PLATELET
BASOS PCT: 0.5 % (ref 0.0–3.0)
Basophils Absolute: 0 10*3/uL (ref 0.0–0.1)
Eosinophils Absolute: 0.1 10*3/uL (ref 0.0–0.7)
Eosinophils Relative: 3.6 % (ref 0.0–5.0)
HCT: 36.5 % (ref 36.0–46.0)
Hemoglobin: 12 g/dL (ref 12.0–15.0)
LYMPHS ABS: 1.2 10*3/uL (ref 0.7–4.0)
Lymphocytes Relative: 32.3 % (ref 12.0–46.0)
MCHC: 32.9 g/dL (ref 30.0–36.0)
MCV: 84.9 fl (ref 78.0–100.0)
MONOS PCT: 5.9 % (ref 3.0–12.0)
Monocytes Absolute: 0.2 10*3/uL (ref 0.1–1.0)
NEUTROS ABS: 2.2 10*3/uL (ref 1.4–7.7)
NEUTROS PCT: 57.7 % (ref 43.0–77.0)
Platelets: 261 10*3/uL (ref 150.0–400.0)
RBC: 4.3 Mil/uL (ref 3.87–5.11)
RDW: 14.4 % (ref 11.5–15.5)
WBC: 3.8 10*3/uL — ABNORMAL LOW (ref 4.0–10.5)

## 2015-08-11 LAB — COMPREHENSIVE METABOLIC PANEL
ALT: 18 U/L (ref 0–35)
AST: 18 U/L (ref 0–37)
Albumin: 3.7 g/dL (ref 3.5–5.2)
Alkaline Phosphatase: 60 U/L (ref 39–117)
BILIRUBIN TOTAL: 0.5 mg/dL (ref 0.2–1.2)
BUN: 21 mg/dL (ref 6–23)
CO2: 28 meq/L (ref 19–32)
CREATININE: 0.94 mg/dL (ref 0.40–1.20)
Calcium: 9.1 mg/dL (ref 8.4–10.5)
Chloride: 106 mEq/L (ref 96–112)
GFR: 79.63 mL/min (ref >60.00)
GLUCOSE: 81 mg/dL (ref 70–99)
Potassium: 4 mEq/L (ref 3.5–5.1)
Sodium: 142 mEq/L (ref 135–145)
Total Protein: 7 g/dL (ref 6.0–8.3)

## 2015-08-11 LAB — LIPID PANEL
CHOLESTEROL: 179 mg/dL (ref 0–200)
HDL: 55.2 mg/dL (ref >39.00)
LDL CALC: 110 mg/dL — AB (ref 0–99)
NonHDL: 123.87
Total CHOL/HDL Ratio: 3
Triglycerides: 68 mg/dL (ref 0.0–149.0)
VLDL: 13.6 mg/dL (ref 0.0–40.0)

## 2015-08-11 LAB — HEPATITIS C ANTIBODY: HCV AB: NEGATIVE

## 2015-08-11 MED ORDER — LOSARTAN POTASSIUM-HCTZ 100-25 MG PO TABS: 1.0000 | ORAL_TABLET | Freq: Every day | ORAL | Status: DC

## 2015-08-11 MED ORDER — HYDROCODONE-ACETAMINOPHEN 5-325 MG PO TABS: 1.0000 | ORAL_TABLET | Freq: Four times a day (QID) | ORAL | Status: DC | PRN

## 2015-08-11 NOTE — Progress Notes (Signed)
Pre visit review using our clinic review tool, if applicable. No additional management support is needed unless otherwise documented below in the visit note. 

## 2015-08-11 NOTE — Progress Notes (Signed)
Patient ID: [name], female    DOB: September 30, 1960  Age: 55 y.o. MRN: [id number]    Subjective:  Subjective HPI [name] presents for bp and cholesterol f/u.  Pt still c/o R knee pain.  She has not seen Dr Rip Harbour in a year.    Review of Systems  Constitutional: Negative for diaphoresis, appetite change, fatigue and unexpected weight change.  Eyes: Negative for pain, redness and visual disturbance.  Respiratory: Negative for cough, chest tightness, shortness of breath and wheezing.   Cardiovascular: Negative for chest pain, palpitations and leg swelling.  Endocrine: Negative for cold intolerance, heat intolerance, polydipsia, polyphagia and polyuria.  Genitourinary: Negative for dysuria, frequency and difficulty urinating.  Musculoskeletal: Positive for joint swelling and gait problem.       R knee pain   Neurological: Negative for dizziness, light-headedness, numbness and headaches.    History Past Medical History  Diagnosis Date  . Hypertension   . Post-menopausal   . Left shoulder pain   . Diverticulosis of colon (without mention of hemorrhage)   . Abdominal pain, other specified site   . Hypokalemia   . Benign neoplasm of skin, site unspecified   . Family history of ischemic heart disease   . Family history of malignant neoplasm of genital organ, other   . Disturbance of skin sensation   . Sprain and strain of unspecified site of knee and leg   . Sprain of neck   . Acute upper respiratory infections of unspecified site   . Family history of diabetes mellitus   . Unspecified essential hypertension   . GERD (gastroesophageal reflux disease)     She has past surgical history that includes Total abdominal hysterectomy w/ bilateral salpingoophorectomy (2002); Bunionectomy (2005); Shoulder surgery; and Abdominal hysterectomy.   Her family history includes Dementia in her mother; Diabetes in her brother and mother; Hypertension in her father, mother, and sister; Prostate  cancer in her father; Stroke in her maternal aunt and mother. There is no history of Colon cancer or Colon cancer.She reports that she has never smoked. She has never used smokeless tobacco. She reports that she does not drink alcohol or use illicit drugs.  Current Outpatient Prescriptions on File Prior to Visit  Medication Sig Dispense Refill  . amLODipine (NORVASC) 10 MG tablet Take 1 tablet (10 mg total) by mouth daily. 90 tablet 3  . Cyanocobalamin (VITAMIN B-12 PO) Take 1 tablet by mouth daily.    . diclofenac (VOLTAREN) 75 MG EC tablet Take 1 tablet (75 mg total) by mouth 2 (two) times daily. 50 tablet 2  . estradiol (ESTRACE) 2 MG tablet Take 1 mg by mouth as needed.     . hydroxychloroquine (PLAQUENIL) 200 MG tablet Take 200 mg by mouth 2 (two) times daily.      . IBUPROFEN IB PO Take by mouth as needed. Pt takes Aleve    . KLOR-CON M10 10 MEQ tablet TAKE TWO TABLETS (20 MEQ) BY MOUTH TWO TIMES A DAY. 120 tablet 1  . naftifine (NAFTIN) 1 % cream Apply topically daily. 60 g 0  . omeprazole (PRILOSEC) 20 MG capsule Take 1 capsule (20 mg total) by mouth daily. 30 capsule 11  . RESTASIS 0.05 % ophthalmic emulsion Place 1 drop into both eyes daily.      No current facility-administered medications on file prior to visit.     Objective:  Objective Physical Exam  Constitutional: She is oriented to person, place, and time. She appears  well-developed and well-nourished.  HENT:  Head: Normocephalic and atraumatic.  Eyes: Conjunctivae and EOM are normal.  Neck: Normal range of motion. Neck supple. No JVD present. Carotid bruit is not present. No thyromegaly present.  Cardiovascular: Normal rate, regular rhythm and normal heart sounds.   No murmur heard. Pulmonary/Chest: Effort normal and breath sounds normal. No respiratory distress. She has no wheezes. She has no rales. She exhibits no tenderness.  Musculoskeletal: She exhibits no edema.  Neurological: She is alert and oriented to person,  place, and time.  Psychiatric: She has a normal mood and affect. Her behavior is normal.  Nursing note and vitals reviewed.  BP 114/79 mmHg  Pulse 73  Temp(Src) 97.9 F (36.6 C) (Oral)  Wt 295 lb 9.6 oz (134.083 kg)  SpO2 98% Wt Readings from Last 3 Encounters:  08/11/15 295 lb 9.6 oz (134.083 kg)  04/22/15 297 lb (134.718 kg)  03/03/15 297 lb (134.718 kg)     Lab Results  Component Value Date   WBC 4.4 08/08/2014   HGB 11.6* 08/08/2014   HCT 34.6* 08/08/2014   PLT 273.0 08/08/2014   GLUCOSE 72 08/08/2014   CHOL 164 08/08/2014   TRIG 63.0 08/08/2014   HDL 53.10 08/08/2014   LDLDIRECT 128.5 04/30/2008   LDLCALC 98 08/08/2014   ALT 12 08/08/2014   AST 13 08/08/2014   NA 141 08/08/2014   K 3.7 08/08/2014   CL 109 08/08/2014   CREATININE 0.84 08/08/2014   BUN 17 08/08/2014   CO2 25 08/08/2014   TSH 1.99 08/08/2014    US Abdomen Complete  08/15/2014  CLINICAL DATA:  Pain under the right scapula for 2 weeks. EXAM: ULTRASOUND ABDOMEN COMPLETE COMPARISON:  Abdominal ultrasound 11/06/2009. FINDINGS: Gallbladder: No gallstones or wall thickening visualized. No sonographic Murphy sign noted. Common bile duct: Diameter: 0.3 cm Liver: No focal lesion identified. Within normal limits in parenchymal echogenicity. IVC: No abnormality visualized. Pancreas: Visualized portion unremarkable. Spleen: Size and appearance within normal limits. Right Kidney: Length: 11.4 cm. Echogenicity within normal limits. No mass or hydronephrosis visualized. Left Kidney: Length: 10.2 cm. Echogenicity within normal limits. No mass or hydronephrosis visualized. Abdominal aorta: No aneurysm visualized. Other findings: None. IMPRESSION: Negative for gallstones.  Negative exam. Electronically Signed   By: Inge Rise M.D.   On: 08/15/2014 09:29     Assessment & Plan:  Plan I have discontinued [name] naproxen, fluticasone, cyclobenzaprine, and amoxicillin-clavulanate. I have also changed her  losartan-hydrochlorothiazide and HYDROcodone-acetaminophen. Additionally, I am having her maintain her estradiol, hydroxychloroquine, RESTASIS, amLODipine, naftifine, omeprazole, diclofenac, KLOR-CON M10, Cyanocobalamin (VITAMIN B-12 PO), and IBUPROFEN IB PO.  Meds ordered this encounter  Medications  . losartan-hydrochlorothiazide (HYZAAR) 100-25 MG tablet    Sig: Take 1 tablet by mouth daily.    Dispense:  90 tablet    Refill:  3  . HYDROcodone-acetaminophen (NORCO/VICODIN) 5-325 MG tablet    Sig: Take 1 tablet by mouth every 6 (six) hours as needed for moderate pain.    Dispense:  30 tablet    Refill:  0    Problem List Items Addressed This Visit      Unprioritized   Essential hypertension - Primary    Stable con't meds      Relevant Medications   losartan-hydrochlorothiazide (HYZAAR) 100-25 MG tablet   Other Relevant Orders   CBC with Differential/Platelet   Lipid panel   POCT urinalysis dipstick   Comprehensive metabolic panel   Osteoarthritis   Relevant Medications   HYDROcodone-acetaminophen (  NORCO/VICODIN) 5-325 MG tablet   Hyperlipidemia   Relevant Medications   losartan-hydrochlorothiazide (HYZAAR) 100-25 MG tablet   Other Relevant Orders   CBC with Differential/Platelet   Lipid panel   Comprehensive metabolic panel    Other Visit Diagnoses    Primary osteoarthritis of right knee        Relevant Medications    HYDROcodone-acetaminophen (NORCO/VICODIN) 5-325 MG tablet    Need for hepatitis C screening test        Relevant Orders    Hepatitis C antibody       Follow-up: Return in about 6 months (around 02/10/2016), or if symptoms worsen or fail to improve, for annual exam, fasting.  [name], DO

## 2015-08-11 NOTE — Assessment & Plan Note (Signed)
Stable con't meds 

## 2015-08-11 NOTE — Patient Instructions (Addendum)
Hypertension Hypertension, commonly called high blood pressure, is when the force of blood pumping through your arteries is too strong. Your arteries are the blood vessels that carry blood from your heart throughout your body. A blood pressure reading consists of a higher number over a lower number, such as 110/72. The higher number (systolic) is the pressure inside your arteries when your heart pumps. The lower number (diastolic) is the pressure inside your arteries when your heart relaxes. Ideally you want your blood pressure below 120/80. Hypertension forces your heart to work harder to pump blood. Your arteries may become narrow or stiff. Having untreated or uncontrolled hypertension can cause heart attack, stroke, kidney disease, and other problems. RISK FACTORS Some risk factors for high blood pressure are controllable. Others are not.  Risk factors you cannot control include:   Race. You may be at higher risk if you are African American.  Age. Risk increases with age.  Gender. Men are at higher risk than women before age 45 years. After age 65, women are at higher risk than men. Risk factors you can control include:  Not getting enough exercise or physical activity.  Being overweight.  Getting too much fat, sugar, calories, or salt in your diet.  Drinking too much alcohol. SIGNS AND SYMPTOMS Hypertension does not usually cause signs or symptoms. Extremely high blood pressure (hypertensive crisis) may cause headache, anxiety, shortness of breath, and nosebleed. DIAGNOSIS To check if you have hypertension, your health care provider will measure your blood pressure while you are seated, with your arm held at the level of your heart. It should be measured at least twice using the same arm. Certain conditions can cause a difference in blood pressure between your right and left arms. A blood pressure reading that is higher than normal on one occasion does not mean that you need treatment. If  it is not clear whether you have high blood pressure, you may be asked to return on a different day to have your blood pressure checked again. Or, you may be asked to monitor your blood pressure at home for 1 or more weeks. TREATMENT Treating high blood pressure includes making lifestyle changes and possibly taking medicine. Living a healthy lifestyle can help lower high blood pressure. You may need to change some of your habits. Lifestyle changes may include:  Following the DASH diet. This diet is high in fruits, vegetables, and whole grains. It is low in salt, red meat, and added sugars.  Keep your sodium intake below 2,300 mg per day.  Getting at least 30-45 minutes of aerobic exercise at least 4 times per week.  Losing weight if necessary.  Not smoking.  Limiting alcoholic beverages.  Learning ways to reduce stress. Your health care provider may prescribe medicine if lifestyle changes are not enough to get your blood pressure under control, and if one of the following is true:  You are 18-59 years of age and your systolic blood pressure is above 140.  You are 60 years of age or older, and your systolic blood pressure is above 150.  Your diastolic blood pressure is above 90.  You have diabetes, and your systolic blood pressure is over 140 or your diastolic blood pressure is over 90.  You have kidney disease and your blood pressure is above 140/90.  You have heart disease and your blood pressure is above 140/90. Your personal target blood pressure may vary depending on your medical conditions, your age, and other factors. HOME CARE INSTRUCTIONS    Have your blood pressure rechecked as directed by your health care provider.   Take medicines only as directed by your health care provider. Follow the directions carefully. Blood pressure medicines must be taken as prescribed. The medicine does not work as well when you skip doses. Skipping doses also puts you at risk for  problems.  Do not smoke.   Monitor your blood pressure at home as directed by your health care provider. SEEK MEDICAL CARE IF:   You think you are having a reaction to medicines taken.  You have recurrent headaches or feel dizzy.  You have swelling in your ankles.  You have trouble with your vision. SEEK IMMEDIATE MEDICAL CARE IF:  You develop a severe headache or confusion.  You have unusual weakness, numbness, or feel faint.  You have severe chest or abdominal pain.  You vomit repeatedly.  You have trouble breathing. MAKE SURE YOU:   Understand these instructions.  Will watch your condition.  Will get help right away if you are not doing well or get worse.   This information is not intended to replace advice given to you by your health care provider. Make sure you discuss any questions you have with your health care provider.   Document Released: 04/05/2005 Document Revised: 08/20/2014 Document Reviewed: 01/26/2013 Elsevier Interactive Patient Education 2016 Elsevier Inc[employer] DASH Eating Plan DASH stands for "Dietary Approaches to Stop Hypertension." The DASH eating plan is a healthy eating plan that has been shown to reduce high blood pressure (hypertension). Additional health benefits may include reducing the risk of type 2 diabetes mellitus, heart disease, and stroke. The DASH eating plan may also help with weight loss. WHAT DO I NEED TO KNOW ABOUT THE DASH EATING PLAN? For the DASH eating plan, you will follow these general guidelines:  Choose foods with a percent daily value for sodium of less than 5% (as listed on the food label).  Use salt-free seasonings or herbs instead of table salt or sea salt.  Check with your health care provider or pharmacist before using salt substitutes.  Eat lower-sodium products, often labeled as "lower sodium" or "no salt added."  Eat fresh foods.  Eat more vegetables, fruits, and low-fat dairy products.  Choose whole grains.  Look for the word "whole" as the first word in the ingredient list.  Choose fish and skinless chicken or turkey more often than red meat. Limit fish, poultry, and meat to 6 oz (170 g) each day.  Limit sweets, desserts, sugars, and sugary drinks.  Choose heart-healthy fats.  Limit cheese to 1 oz (28 g) per day.  Eat more home-cooked food and less restaurant, buffet, and fast food.  Limit fried foods.  Cook foods using methods other than frying.  Limit canned vegetables. If you do use them, rinse them well to decrease the sodium.  When eating at a restaurant, ask that your food be prepared with less salt, or no salt if possible. WHAT FOODS CAN I EAT? Seek help from a dietitian for individual calorie needs. Grains Whole grain or whole wheat bread. Brown rice. Whole grain or whole wheat pasta. Quinoa, bulgur, and whole grain cereals. Low-sodium cereals. Corn or whole wheat flour tortillas. Whole grain cornbread. Whole grain crackers. Low-sodium crackers. Vegetables Fresh or frozen vegetables (raw, steamed, roasted, or grilled). Low-sodium or reduced-sodium tomato and vegetable juices. Low-sodium or reduced-sodium tomato sauce and paste. Low-sodium or reduced-sodium canned vegetables.  Fruits All fresh, canned (in natural juice), or frozen fruits. Meat and Other   Protein Products Ground beef (85% or leaner), grass-fed beef, or beef trimmed of fat. Skinless chicken or turkey. Ground chicken or turkey. Pork trimmed of fat. All fish and seafood. Eggs. Dried beans, peas, or lentils. Unsalted nuts and seeds. Unsalted canned beans. Dairy Low-fat dairy products, such as skim or 1% milk, 2% or reduced-fat cheeses, low-fat ricotta or cottage cheese, or plain low-fat yogurt. Low-sodium or reduced-sodium cheeses. Fats and Oils Tub margarines without trans fats. Light or reduced-fat mayonnaise and salad dressings (reduced sodium). Avocado. Safflower, olive, or canola oils. Natural peanut or almond  butter. Other Unsalted popcorn and pretzels. The items listed above may not be a complete list of recommended foods or beverages. Contact your dietitian for more options. WHAT FOODS ARE NOT RECOMMENDED? Grains White bread. White pasta. White rice. Refined cornbread. Bagels and croissants. Crackers that contain trans fat. Vegetables Creamed or fried vegetables. Vegetables in a cheese sauce. Regular canned vegetables. Regular canned tomato sauce and paste. Regular tomato and vegetable juices. Fruits Dried fruits. Canned fruit in light or heavy syrup. Fruit juice. Meat and Other Protein Products Fatty cuts of meat. Ribs, chicken wings, bacon, sausage, bologna, salami, chitterlings, fatback, hot dogs, bratwurst, and packaged luncheon meats. Salted nuts and seeds. Canned beans with salt. Dairy Whole or 2% milk, cream, half-and-half, and cream cheese. Whole-fat or sweetened yogurt. Full-fat cheeses or blue cheese. Nondairy creamers and whipped toppings. Processed cheese, cheese spreads, or cheese curds. Condiments Onion and garlic salt, seasoned salt, table salt, and sea salt. Canned and packaged gravies. Worcestershire sauce. Tartar sauce. Barbecue sauce. Teriyaki sauce. Soy sauce, including reduced sodium. Steak sauce. Fish sauce. Oyster sauce. Cocktail sauce. Horseradish. Ketchup and mustard. Meat flavorings and tenderizers. Bouillon cubes. Hot sauce. Tabasco sauce. Marinades. Taco seasonings. Relishes. Fats and Oils Butter, stick margarine, lard, shortening, ghee, and bacon fat. Coconut, palm kernel, or palm oils. Regular salad dressings. Other Pickles and olives. Salted popcorn and pretzels. The items listed above may not be a complete list of foods and beverages to avoid. Contact your dietitian for more information. WHERE CAN I FIND MORE INFORMATION? National Heart, Lung, and Blood Institute: www.nhlbi.nih.gov/health/health-topics/topics/dash/   This information is not intended to replace  advice given to you by your health care provider. Make sure you discuss any questions you have with your health care provider.   Document Released: 03/25/2011 Document Revised: 04/26/2014 Document Reviewed: 02/07/2013 Elsevier Interactive Patient Education 2016 Elsevier Inc.  

## 2015-10-25 ENCOUNTER — Ambulatory Visit (INDEPENDENT_AMBULATORY_CARE_PROVIDER_SITE_OTHER): Payer: Managed Care, Other (non HMO)

## 2015-10-25 ENCOUNTER — Ambulatory Visit (HOSPITAL_COMMUNITY)
Admission: EM | Admit: 2015-10-25 | Discharge: 2015-10-25 | Disposition: A | Discharge status: Home / Self Care | Payer: Managed Care, Other (non HMO) | Attending: Family Medicine | Admitting: Family Medicine

## 2015-10-25 ENCOUNTER — Encounter (HOSPITAL_COMMUNITY): Payer: Self-pay | Admitting: *Deleted

## 2015-10-25 DIAGNOSIS — M160 Bilateral primary osteoarthritis of hip: Secondary | ICD-10-CM | POA: Diagnosis not present

## 2015-10-25 DIAGNOSIS — R52 Pain, unspecified: Secondary | ICD-10-CM | POA: Diagnosis not present

## 2015-10-25 MED ORDER — IBUPROFEN 800 MG PO TABS: 800.0000 mg | ORAL_TABLET | Freq: Three times a day (TID) | ORAL | Status: DC | PRN

## 2015-10-25 NOTE — ED Provider Notes (Signed)
CSN: [id number]     Arrival date & time 10/25/15  1737 History   First MD Initiated Contact with Patient 10/25/15 1802     Chief Complaint  Patient presents with  . Hip Pain   (Consider location/radiation/quality/duration/timing/severity/associated sxs/prior Treatment) Patient is a 55 y.o. female presenting with hip pain. The history is provided by the patient.  Hip Pain This is a new problem. The current episode started 6 to 12 hours ago (going to get out of chair and felt sudden left hip pain, no back pain , no distal nvt problems in leg, , abd benign.). The problem has not changed since onset.Pertinent negatives include no chest pain, no abdominal pain, no headaches and no shortness of breath.    Past Medical History  Diagnosis Date  . Hypertension   . Post-menopausal   . Left shoulder pain   . Diverticulosis of colon (without mention of hemorrhage)   . Abdominal pain, other specified site   . Hypokalemia   . Benign neoplasm of skin, site unspecified   . Family history of ischemic heart disease   . Family history of malignant neoplasm of genital organ, other   . Disturbance of skin sensation   . Sprain and strain of unspecified site of knee and leg   . Sprain of neck   . Acute upper respiratory infections of unspecified site   . Family history of diabetes mellitus   . Unspecified essential hypertension   . GERD (gastroesophageal reflux disease)    Past Surgical History  Procedure Laterality Date  . Total abdominal hysterectomy w/ bilateral salpingoophorectomy  2002  . Bunionectomy  2005    right foot  . Shoulder surgery      left;spurs 2-09- dr Rhoderick Moody  . Abdominal hysterectomy     Family History  Problem Relation Age of Onset  . Diabetes Mother   . Hypertension Mother   . Colon cancer Neg Hx   . Dementia Mother   . Stroke Maternal Aunt   . Prostate cancer Father   . Colon cancer Neg Hx   . Diabetes Brother   . Hypertension Father   . Hypertension Sister   . Stroke  Mother    Social History  Substance Use Topics  . Smoking status: Never Smoker   . Smokeless tobacco: Never Used  . Alcohol Use: No   OB History    No data available     Review of Systems  Constitutional: Negative.   Respiratory: Negative for shortness of breath.   Cardiovascular: Negative for chest pain.  Gastrointestinal: Negative for abdominal pain.  Musculoskeletal: Positive for myalgias and gait problem. Negative for back pain and joint swelling.  Skin: Negative.   Neurological: Negative for headaches.  All other systems reviewed and are negative.   Allergies  Review of patient's allergies indicates no known allergies.  Home Medications   Prior to Admission medications   Medication Sig Start Date End Date Taking? Authorizing Provider  amLODipine (NORVASC) 10 MG tablet Take 1 tablet (10 mg total) by mouth daily. 08/08/14   Rosalita Chessman Chase, DO  Cyanocobalamin (VITAMIN B-12 PO) Take 1 tablet by mouth daily.    Historical Provider, MD  diclofenac (VOLTAREN) 75 MG EC tablet Take 1 tablet (75 mg total) by mouth 2 (two) times daily. 03/03/15   Wallene Huh, DPM  estradiol (ESTRACE) 2 MG tablet Take 1 mg by mouth as needed.     Historical Provider, MD  HYDROcodone-acetaminophen (NORCO/VICODIN) 5-325 MG tablet  Take 1 tablet by mouth every 6 (six) hours as needed for moderate pain. 08/11/15   Rosalita Chessman Chase, DO  hydroxychloroquine (PLAQUENIL) 200 MG tablet Take 200 mg by mouth 2 (two) times daily.      Historical Provider, MD  ibuprofen (ADVIL,MOTRIN) 800 MG tablet Take 1 tablet (800 mg total) by mouth every 8 (eight) hours as needed. 10/25/15   Billy Fischer, MD  KLOR-CON M10 10 MEQ tablet TAKE TWO TABLETS (20 MEQ) BY MOUTH TWO TIMES A DAY. 07/10/15   Alferd Apa Lowne Chase, DO  losartan-hydrochlorothiazide (HYZAAR) 100-25 MG tablet Take 1 tablet by mouth daily. 08/11/15   Rosalita Chessman Chase, DO  naftifine (NAFTIN) 1 % cream Apply topically daily. 08/09/14   Rosalita Chessman  Chase, DO  omeprazole (PRILOSEC) 20 MG capsule Take 1 capsule (20 mg total) by mouth daily. 12/13/14   Alferd Apa Lowne Chase, DO  RESTASIS 0.05 % ophthalmic emulsion Place 1 drop into both eyes daily.  11/23/10   Historical Provider, MD   Meds Ordered and Administered this Visit  Medications - No data to display  BP 128/86 mmHg  Pulse 78  Temp(Src) 98.6 F (37 C) (Oral)  Resp 18  SpO2 98% No data found.   Physical Exam  Constitutional: She is oriented to person, place, and time. She appears well-developed and well-nourished. No distress.  Abdominal: Soft. Bowel sounds are normal. There is no tenderness.  Musculoskeletal: She exhibits tenderness.       Left hip: She exhibits decreased range of motion, decreased strength and tenderness.       Legs: Neurological: She is alert and oriented to person, place, and time.  Skin: Skin is warm and dry.  Nursing note and vitals reviewed.   ED Course  Procedures (including critical care time)  Labs Review Labs Reviewed - No data to display  Imaging Review Dg Hip Unilat With Pelvis 2-3 Views Left  10/25/2015  CLINICAL DATA:  Left hip pain. Pain aggravated by walking. Onset this afternoon. No known injury. EXAM: DG HIP (WITH OR WITHOUT PELVIS) 2-3V LEFT COMPARISON:  None. FINDINGS: Bilateral hip osteoarthritis, left greater than right, with joint space narrowing and acetabular spurring. No fracture dislocation. Pubic symphysis and sacroiliac joints are congruent. No abnormal density to suggest focal osseous lesion. IMPRESSION: Moderate osteoarthritis of both hips, left greater than right. No acute osseous abnormality is seen. Electronically Signed   By: Jeb Levering M.D.   On: 10/25/2015 18:44    X-rays reviewed and report per radiologist.  Visual Acuity Review  Right Eye Distance:   Left Eye Distance:   Bilateral Distance:    Right Eye Near:   Left Eye Near:    Bilateral Near:         MDM   1. Primary osteoarthritis of both  hips   2. Pain aggravated by walking        Billy Fischer, MD 10/25/15 [phone]

## 2015-10-25 NOTE — ED Notes (Signed)
Pt reports  l  Hip     Pain     Today   She   denys  Any       specefic injury   She  Reports  Pain on  Weight  Bearing          she  denys  Any chest  Pain  Or  Any  Shortness  Of  Breath     Her  Skin is  Warm  And  Dry

## 2015-12-27 ENCOUNTER — Other Ambulatory Visit: Payer: Self-pay | Admitting: Family Medicine

## 2015-12-27 DIAGNOSIS — R1013 Epigastric pain: Secondary | ICD-10-CM

## 2016-02-12 ENCOUNTER — Encounter: Payer: Self-pay | Admitting: Family Medicine

## 2016-02-12 ENCOUNTER — Ambulatory Visit (INDEPENDENT_AMBULATORY_CARE_PROVIDER_SITE_OTHER): Payer: Managed Care, Other (non HMO) | Admitting: Family Medicine

## 2016-02-12 VITALS — BP 122/70 | HR 72 | Temp 97.8°F | Resp 16 | Ht 68.0 in | Wt 288.8 lb

## 2016-02-12 DIAGNOSIS — Z0000 Encounter for general adult medical examination without abnormal findings: Secondary | ICD-10-CM

## 2016-02-12 DIAGNOSIS — B354 Tinea corporis: Secondary | ICD-10-CM

## 2016-02-12 DIAGNOSIS — I10 Essential (primary) hypertension: Secondary | ICD-10-CM | POA: Diagnosis not present

## 2016-02-12 MED ORDER — NAFTIFINE HCL 1 % EX CREA: TOPICAL_CREAM | Freq: Every day | CUTANEOUS | 0 refills | Status: DC

## 2016-02-12 NOTE — Progress Notes (Signed)
Subjective:     [name] is a 55 y.o. female and is here for a comprehensive physical exam. The patient reports no problems. He is also for f/u bp,   Social History   Social History  . Marital status: Married    Spouse name: N/A  . Number of children: 0  . Years of education: N/A   Occupational History  . [employer]   Social History Main Topics  . Smoking status: Never Smoker  . Smokeless tobacco: Never Used  . Alcohol use No  . Drug use: No  . Sexual activity: Yes    Partners: Male   Other Topics Concern  . Not on file   Social History Narrative   Regular exercise- joined [employer]--- plans to go 5 days a week   Health Maintenance  Topic Date Due  . MAMMOGRAM  02/08/2018  . TETANUS/TDAP  04/30/2018  . COLONOSCOPY  09/26/2018  . PAP SMEAR  02/09/2019  . INFLUENZA VACCINE  Addressed  . Hepatitis C Screening  Completed  . HIV Screening  Completed    The following portions of the patient's history were reviewed and updated as appropriate:  She  has a past medical history of Abdominal pain, other specified site; Acute upper respiratory infections of unspecified site; Benign neoplasm of skin, site unspecified; Disturbance of skin sensation; Diverticulosis of colon (without mention of hemorrhage); Family history of diabetes mellitus; Family history of ischemic heart disease; Family history of malignant neoplasm of genital organ, other; GERD (gastroesophageal reflux disease); Hypertension; Hypokalemia; Left shoulder pain; Post-menopausal; Sprain and strain of unspecified site of knee and leg; Sprain of neck; and Unspecified essential hypertension. She  does not have any pertinent problems on file. She  has a past surgical history that includes Total abdominal hysterectomy w/ bilateral salpingoophorectomy (2002); Bunionectomy (2005); Shoulder surgery; and Abdominal hysterectomy. Her family history includes Dementia in her mother; Diabetes in her brother and  mother; Hypertension in her father, mother, and sister; Prostate cancer in her father; Stroke in her maternal aunt and mother. She  reports that she has never smoked. She has never used smokeless tobacco. She reports that she does not drink alcohol or use drugs. She has a current medication list which includes the following prescription(s): amlodipine, cyanocobalamin, estradiol, hydrocodone-acetaminophen, hydroxychloroquine, ibuprofen, klor-con m10, losartan-hydrochlorothiazide, omeprazole, restasis, diclofenac, and naftifine. Current Outpatient Prescriptions on File Prior to Visit  Medication Sig Dispense Refill  . amLODipine (NORVASC) 10 MG tablet Take 1 tablet (10 mg total) by mouth daily. 90 tablet 3  . Cyanocobalamin (VITAMIN B-12 PO) Take 1 tablet by mouth daily.    Marland Kitchen estradiol (ESTRACE) 2 MG tablet Take 1 mg by mouth as needed.     Marland Kitchen HYDROcodone-acetaminophen (NORCO/VICODIN) 5-325 MG tablet Take 1 tablet by mouth every 6 (six) hours as needed for moderate pain. 30 tablet 0  . hydroxychloroquine (PLAQUENIL) 200 MG tablet Take 200 mg by mouth 2 (two) times daily.      Marland Kitchen ibuprofen (ADVIL,MOTRIN) 800 MG tablet Take 1 tablet (800 mg total) by mouth every 8 (eight) hours as needed. 30 tablet 0  . KLOR-CON M10 10 MEQ tablet TAKE TWO TABLETS BY MOUTH TWO TIMES A DAY` 120 tablet 1  . losartan-hydrochlorothiazide (HYZAAR) 100-25 MG tablet Take 1 tablet by mouth daily. 90 tablet 3  . omeprazole (PRILOSEC) 20 MG capsule TAKE ONE CAPSULE BY MOUTH EVERY DAY 30 capsule 11  . RESTASIS 0.05 % ophthalmic emulsion Place 1 drop into  both eyes daily.     . diclofenac (VOLTAREN) 75 MG EC tablet Take 1 tablet (75 mg total) by mouth 2 (two) times daily. (Patient not taking: Reported on 02/12/2016) 50 tablet 2   No current facility-administered medications on file prior to visit.    She has No Known Allergies..  Review of Systems Review of Systems  Constitutional: Negative for activity change, appetite change  and fatigue.  HENT: Negative for hearing loss, congestion, tinnitus and ear discharge.  dentist [id number]: Negative for visual disturbance (see optho q1y -- vision corrected to 20/20 with glasses).  Respiratory: Negative for cough, chest tightness and shortness of breath.   Cardiovascular: Negative for chest pain, palpitations and leg swelling.  Gastrointestinal: Negative for abdominal pain, diarrhea, constipation and abdominal distention.  Genitourinary: Negative for urgency, frequency, decreased urine volume and difficulty urinating.  Musculoskeletal: Negative for back pain, arthralgias and gait problem.  Skin: Negative for color change, pallor and rash.  Neurological: Negative for dizziness, light-headedness, numbness and headaches.  Hematological: Negative for adenopathy. Does not bruise/bleed easily.  Psychiatric/Behavioral: Negative for suicidal ideas, confusion, sleep disturbance, self-injury, dysphoric mood, decreased concentration and agitation.       Objective:    BP 122/70 (BP Location: Right Arm, Patient Position: Sitting, Cuff Size: Large)   Pulse 72   Temp 97.8 F (36.6 C) (Oral)   Resp 16   Ht '5\' 8"'  (1.727 m)   Wt 288 lb 12.8 oz (131 kg)   SpO2 97%   BMI 43.91 kg/m  General appearance: alert, cooperative, appears stated age and no distress Head: Normocephalic, without obvious abnormality, atraumatic Eyes: conjunctivae/corneas clear. PERRL, EOM's intact. Fundi benign. Ears: normal TM's and external ear canals both ears Nose: Nares normal. Septum midline. Mucosa normal. No drainage or sinus tenderness. Throat: lips, mucosa, and tongue normal; teeth and gums normal Neck: no adenopathy, no carotid bruit, no JVD, supple, symmetrical, trachea midline and thyroid not enlarged, symmetric, no tenderness/mass/nodules Back: symmetric, no curvature. ROM normal. No CVA tenderness. Lungs: clear to auscultation bilaterally Breasts: gyn Heart: regular rate and rhythm, S1, S2  normal, no murmur, click, rub or gallop Abdomen: soft, non-tender; bowel sounds normal; no masses,  no organomegaly Pelvic: deferred -gynExtremities: extremities normal, atraumatic, no cyanosis or edema Pulses: 2+ and symmetric Skin: Skin color, texture, turgor normal. No rashes or lesions Lymph nodes: Cervical, supraclavicular, and axillary nodes normal. Neurologic: Alert and oriented X 3, normal strength and tone. Normal symmetric reflexes. Normal coordination and gait    Assessment:    Healthy female exam.      Plan:    ghm utd Check labs See After Visit Summary for Counseling Recommendations    1. Tinea corporis  - naftifine (NAFTIN) 1 % cream; Apply topically daily.  Dispense: 60 g; Refill: 0 - CBC w/Diff; Future - Comp Met (CMET); Future - Lipid panel; Future - TSH; Future  2. Preventative health care See above - POCT urinalysis dipstick - CBC w/Diff; Future - Comp Met (CMET); Future - Lipid panel; Future - TSH; Future  3. Essential hypertension Stable,  con't losartan - POCT urinalysis dipstick - CBC w/Diff; Future - Comp Met (CMET); Future - Lipid panel; Future - TSH; Future

## 2016-02-12 NOTE — Progress Notes (Signed)
Pre visit review using our clinic review tool, if applicable. No additional management support is needed unless otherwise documented below in the visit note. 

## 2016-02-12 NOTE — Patient Instructions (Signed)
Preventive Care for Adults, Female A healthy lifestyle and preventive care can promote health and wellness. Preventive health guidelines for women include the following key practices.  A routine yearly physical is a good way to check with your health care provider about your health and preventive screening. It is a chance to share any concerns and updates on your health and to receive a thorough exam.  Visit your dentist for a routine exam and preventive care every 6 months. Brush your teeth twice a day and floss once a day. Good oral hygiene prevents tooth decay and gum disease.  The frequency of eye exams is based on your age, health, family medical history, use of contact lenses, and other factors. Follow your health care provider's recommendations for frequency of eye exams.  Eat a healthy diet. Foods like vegetables, fruits, whole grains, low-fat dairy products, and lean protein foods contain the nutrients you need without too many calories. Decrease your intake of foods high in solid fats, added sugars, and salt. Eat the right amount of calories for you.Get information about a proper diet from your health care provider, if necessary.  Regular physical exercise is one of the most important things you can do for your health. Most adults should get at least 150 minutes of moderate-intensity exercise (any activity that increases your heart rate and causes you to sweat) each week. In addition, most adults need muscle-strengthening exercises on 2 or more days a week.  Maintain a healthy weight. The body mass index (BMI) is a screening tool to identify possible weight problems. It provides an estimate of body fat based on height and weight. Your health care provider can find your BMI and can help you achieve or maintain a healthy weight.For adults 20 years and older:  A BMI below 18.5 is considered underweight.  A BMI of 18.5 to 24.9 is normal.  A BMI of 25 to 29.9 is considered overweight.  A  BMI of 30 and above is considered obese.  Maintain normal blood lipids and cholesterol levels by exercising and minimizing your intake of saturated fat. Eat a balanced diet with plenty of fruit and vegetables. Blood tests for lipids and cholesterol should begin at age 45 and be repeated every 5 years. If your lipid or cholesterol levels are high, you are over 50, or you are at high risk for heart disease, you may need your cholesterol levels checked more frequently.Ongoing high lipid and cholesterol levels should be treated with medicines if diet and exercise are not working.  If you smoke, find out from your health care provider how to quit. If you do not use tobacco, do not start.  Lung cancer screening is recommended for adults aged 45-80 years who are at high risk for developing lung cancer because of a history of smoking. A yearly low-dose CT scan of the lungs is recommended for people who have at least a 30-pack-year history of smoking and are a current smoker or have quit within the past 15 years. A pack year of smoking is smoking an average of 1 pack of cigarettes a day for 1 year (for example: 1 pack a day for 30 years or 2 packs a day for 15 years). Yearly screening should continue until the smoker has stopped smoking for at least 15 years. Yearly screening should be stopped for people who develop a health problem that would prevent them from having lung cancer treatment.  If you are pregnant, do not drink alcohol. If you are  breastfeeding, be very cautious about drinking alcohol. If you are not pregnant and choose to drink alcohol, do not have more than 1 drink per day. One drink is considered to be 12 ounces (355 mL) of beer, 5 ounces (148 mL) of wine, or 1.5 ounces (44 mL) of liquor.  Avoid use of street drugs. Do not share needles with anyone. Ask for help if you need support or instructions about stopping the use of drugs.  High blood pressure causes heart disease and increases the risk  of stroke. Your blood pressure should be checked at least every 1 to 2 years. Ongoing high blood pressure should be treated with medicines if weight loss and exercise do not work.  If you are 55-79 years old, ask your health care provider if you should take aspirin to prevent strokes.  Diabetes screening is done by taking a blood sample to check your blood glucose level after you have not eaten for a certain period of time (fasting). If you are not overweight and you do not have risk factors for diabetes, you should be screened once every 3 years starting at age 45. If you are overweight or obese and you are 40-70 years of age, you should be screened for diabetes every year as part of your cardiovascular risk assessment.  Breast cancer screening is essential preventive care for women. You should practice "breast self-awareness." This means understanding the normal appearance and feel of your breasts and may include breast self-examination. Any changes detected, no matter how small, should be reported to a health care provider. Women in their 20s and 30s should have a clinical breast exam (CBE) by a health care provider as part of a regular health exam every 1 to 3 years. After age 40, women should have a CBE every year. Starting at age 40, women should consider having a mammogram (breast X-ray test) every year. Women who have a family history of breast cancer should talk to their health care provider about genetic screening. Women at a high risk of breast cancer should talk to their health care providers about having an MRI and a mammogram every year.  Breast cancer gene (BRCA)-related cancer risk assessment is recommended for women who have family members with BRCA-related cancers. BRCA-related cancers include breast, ovarian, tubal, and peritoneal cancers. Having family members with these cancers may be associated with an increased risk for harmful changes (mutations) in the breast cancer genes BRCA1 and  BRCA2. Results of the assessment will determine the need for genetic counseling and BRCA1 and BRCA2 testing.  Your health care provider may recommend that you be screened regularly for cancer of the pelvic organs (ovaries, uterus, and vagina). This screening involves a pelvic examination, including checking for microscopic changes to the surface of your cervix (Pap test). You may be encouraged to have this screening done every 3 years, beginning at age 21.  For women ages 30-65, health care providers may recommend pelvic exams and Pap testing every 3 years, or they may recommend the Pap and pelvic exam, combined with testing for human papilloma virus (HPV), every 5 years. Some types of HPV increase your risk of cervical cancer. Testing for HPV may also be done on women of any age with unclear Pap test results.  Other health care providers may not recommend any screening for nonpregnant women who are considered low risk for pelvic cancer and who do not have symptoms. Ask your health care provider if a screening pelvic exam is right for   you.  If you have had past treatment for cervical cancer or a condition that could lead to cancer, you need Pap tests and screening for cancer for at least 20 years after your treatment. If Pap tests have been discontinued, your risk factors (such as having a new sexual partner) need to be reassessed to determine if screening should resume. Some women have medical problems that increase the chance of getting cervical cancer. In these cases, your health care provider may recommend more frequent screening and Pap tests.  Colorectal cancer can be detected and often prevented. Most routine colorectal cancer screening begins at the age of 50 years and continues through age 75 years. However, your health care provider may recommend screening at an earlier age if you have risk factors for colon cancer. On a yearly basis, your health care provider may provide home test kits to check  for hidden blood in the stool. Use of a small camera at the end of a tube, to directly examine the colon (sigmoidoscopy or colonoscopy), can detect the earliest forms of colorectal cancer. Talk to your health care provider about this at age 50, when routine screening begins. Direct exam of the colon should be repeated every 5-10 years through age 75 years, unless early forms of precancerous polyps or small growths are found.  People who are at an increased risk for hepatitis B should be screened for this virus. You are considered at high risk for hepatitis B if:  You were born in a country where hepatitis B occurs often. Talk with your health care provider about which countries are considered high risk.  Your parents were born in a high-risk country and you have not received a shot to protect against hepatitis B (hepatitis B vaccine).  You have HIV or AIDS.  You use needles to inject street drugs.  You live with, or have sex with, someone who has hepatitis B.  You get hemodialysis treatment.  You take certain medicines for conditions like cancer, organ transplantation, and autoimmune conditions.  Hepatitis C blood testing is recommended for all people born from 1945 through 1965 and any individual with known risks for hepatitis C.  Practice safe sex. Use condoms and avoid high-risk sexual practices to reduce the spread of sexually transmitted infections (STIs). STIs include gonorrhea, chlamydia, syphilis, trichomonas, herpes, HPV, and human immunodeficiency virus (HIV). Herpes, HIV, and HPV are viral illnesses that have no cure. They can result in disability, cancer, and death.  You should be screened for sexually transmitted illnesses (STIs) including gonorrhea and chlamydia if:  You are sexually active and are younger than 24 years.  You are older than 24 years and your health care provider tells you that you are at risk for this type of infection.  Your sexual activity has changed  since you were last screened and you are at an increased risk for chlamydia or gonorrhea. Ask your health care provider if you are at risk.  If you are at risk of being infected with HIV, it is recommended that you take a prescription medicine daily to prevent HIV infection. This is called preexposure prophylaxis (PrEP). You are considered at risk if:  You are sexually active and do not regularly use condoms or know the HIV status of your partner(s).  You take drugs by injection.  You are sexually active with a partner who has HIV.  Talk with your health care provider about whether you are at high risk of being infected with HIV. If   you choose to begin PrEP, you should first be tested for HIV. You should then be tested every 3 months for as long as you are taking PrEP.  Osteoporosis is a disease in which the bones lose minerals and strength with aging. This can result in serious bone fractures or breaks. The risk of osteoporosis can be identified using a bone density scan. Women ages 67 years and over and women at risk for fractures or osteoporosis should discuss screening with their health care providers. Ask your health care provider whether you should take a calcium supplement or vitamin D to reduce the rate of osteoporosis.  Menopause can be associated with physical symptoms and risks. Hormone replacement therapy is available to decrease symptoms and risks. You should talk to your health care provider about whether hormone replacement therapy is right for you.  Use sunscreen. Apply sunscreen liberally and repeatedly throughout the day. You should seek shade when your shadow is shorter than you. Protect yourself by wearing long sleeves, pants, a wide-brimmed hat, and sunglasses year round, whenever you are outdoors.  Once a month, do a whole body skin exam, using a mirror to look at the skin on your back. Tell your health care provider of new moles, moles that have irregular borders, moles that  are larger than a pencil eraser, or moles that have changed in shape or color.  Stay current with required vaccines (immunizations).  Influenza vaccine. All adults should be immunized every year.  Tetanus, diphtheria, and acellular pertussis (Td, Tdap) vaccine. Pregnant women should receive 1 dose of Tdap vaccine during each pregnancy. The dose should be obtained regardless of the length of time since the last dose. Immunization is preferred during the 27th-36th week of gestation. An adult who has not previously received Tdap or who does not know her vaccine status should receive 1 dose of Tdap. This initial dose should be followed by tetanus and diphtheria toxoids (Td) booster doses every 10 years. Adults with an unknown or incomplete history of completing a 3-dose immunization series with Td-containing vaccines should begin or complete a primary immunization series including a Tdap dose. Adults should receive a Td booster every 10 years.  Varicella vaccine. An adult without evidence of immunity to varicella should receive 2 doses or a second dose if she has previously received 1 dose. Pregnant females who do not have evidence of immunity should receive the first dose after pregnancy. This first dose should be obtained before leaving the health care facility. The second dose should be obtained 4-8 weeks after the first dose.  Human papillomavirus (HPV) vaccine. Females aged 13-26 years who have not received the vaccine previously should obtain the 3-dose series. The vaccine is not recommended for use in pregnant females. However, pregnancy testing is not needed before receiving a dose. If a female is found to be pregnant after receiving a dose, no treatment is needed. In that case, the remaining doses should be delayed until after the pregnancy. Immunization is recommended for any person with an immunocompromised condition through the age of 61 years if she did not get any or all doses earlier. During the  3-dose series, the second dose should be obtained 4-8 weeks after the first dose. The third dose should be obtained 24 weeks after the first dose and 16 weeks after the second dose.  Zoster vaccine. One dose is recommended for adults aged 30 years or older unless certain conditions are present.  Measles, mumps, and rubella (MMR) vaccine. Adults born  before 1957 generally are considered immune to measles and mumps. Adults born in 1957 or later should have 1 or more doses of MMR vaccine unless there is a contraindication to the vaccine or there is laboratory evidence of immunity to each of the three diseases. A routine second dose of MMR vaccine should be obtained at least 28 days after the first dose for students attending postsecondary schools, health care workers, or international travelers. People who received inactivated measles vaccine or an unknown type of measles vaccine during 1963-1967 should receive 2 doses of MMR vaccine. People who received inactivated mumps vaccine or an unknown type of mumps vaccine before 1979 and are at high risk for mumps infection should consider immunization with 2 doses of MMR vaccine. For females of childbearing age, rubella immunity should be determined. If there is no evidence of immunity, females who are not pregnant should be vaccinated. If there is no evidence of immunity, females who are pregnant should delay immunization until after pregnancy. Unvaccinated health care workers born before 1957 who lack laboratory evidence of measles, mumps, or rubella immunity or laboratory confirmation of disease should consider measles and mumps immunization with 2 doses of MMR vaccine or rubella immunization with 1 dose of MMR vaccine.  Pneumococcal 13-valent conjugate (PCV13) vaccine. When indicated, a person who is uncertain of his immunization history and has no record of immunization should receive the PCV13 vaccine. All adults 65 years of age and older should receive this  vaccine. An adult aged 19 years or older who has certain medical conditions and has not been previously immunized should receive 1 dose of PCV13 vaccine. This PCV13 should be followed with a dose of pneumococcal polysaccharide (PPSV23) vaccine. Adults who are at high risk for pneumococcal disease should obtain the PPSV23 vaccine at least 8 weeks after the dose of PCV13 vaccine. Adults older than 55 years of age who have normal immune system function should obtain the PPSV23 vaccine dose at least 1 year after the dose of PCV13 vaccine.  Pneumococcal polysaccharide (PPSV23) vaccine. When PCV13 is also indicated, PCV13 should be obtained first. All adults aged 65 years and older should be immunized. An adult younger than age 65 years who has certain medical conditions should be immunized. Any person who resides in a nursing home or long-term care facility should be immunized. An adult smoker should be immunized. People with an immunocompromised condition and certain other conditions should receive both PCV13 and PPSV23 vaccines. People with human immunodeficiency virus (HIV) infection should be immunized as soon as possible after diagnosis. Immunization during chemotherapy or radiation therapy should be avoided. Routine use of PPSV23 vaccine is not recommended for American Indians, [place] Natives, or people younger than 65 years unless there are medical conditions that require PPSV23 vaccine. When indicated, people who have unknown immunization and have no record of immunization should receive PPSV23 vaccine. One-time revaccination 5 years after the first dose of PPSV23 is recommended for people aged 19-64 years who have chronic kidney failure, nephrotic syndrome, asplenia, or immunocompromised conditions. People who received 1-2 doses of PPSV23 before age 65 years should receive another dose of PPSV23 vaccine at age 65 years or later if at least 5 years have passed since the previous dose. Doses of PPSV23 are not  needed for people immunized with PPSV23 at or after age 65 years.  Meningococcal vaccine. Adults with asplenia or persistent complement component deficiencies should receive 2 doses of quadrivalent meningococcal conjugate (MenACWY-D) vaccine. The doses should be obtained   at least 2 months apart. Microbiologists working with certain meningococcal bacteria, [place] recruits, people at risk during an outbreak, and people who travel to or live in countries with a high rate of meningitis should be immunized. A first-year college student up through age 34 years who is living in a residence hall should receive a dose if she did not receive a dose on or after her 16th birthday. Adults who have certain high-risk conditions should receive one or more doses of vaccine.  Hepatitis A vaccine. Adults who wish to be protected from this disease, have certain high-risk conditions, work with hepatitis A-infected animals, work in hepatitis A research labs, or travel to or work in countries with a high rate of hepatitis A should be immunized. Adults who were previously unvaccinated and who anticipate close contact with an international adoptee during the first 60 days after arrival in the [place] from a country with a high rate of hepatitis A should be immunized.  Hepatitis B vaccine. Adults who wish to be protected from this disease, have certain high-risk conditions, may be exposed to blood or other infectious body fluids, are household contacts or sex partners of hepatitis B positive people, are clients or workers in certain care facilities, or travel to or work in countries with a high rate of hepatitis B should be immunized.  Haemophilus influenzae type b (Hib) vaccine. A previously unvaccinated person with asplenia or sickle cell disease or having a scheduled splenectomy should receive 1 dose of Hib vaccine. Regardless of previous immunization, a recipient of a hematopoietic stem cell transplant should receive a  3-dose series 6-12 months after her successful transplant. Hib vaccine is not recommended for adults with HIV infection. Preventive Services / Frequency Ages 35 to 4 years  Blood pressure check.** / Every 3-5 years.  Lipid and cholesterol check.** / Every 5 years beginning at age 60.  Clinical breast exam.** / Every 3 years for women in their 71s and 10s.  BRCA-related cancer risk assessment.** / For women who have family members with a BRCA-related cancer (breast, ovarian, tubal, or peritoneal cancers).  Pap test.** / Every 2 years from ages 76 through 26. Every 3 years starting at age 61 through age 76 or 93 with a history of 3 consecutive normal Pap tests.  HPV screening.** / Every 3 years from ages 37 through ages 60 to 51 with a history of 3 consecutive normal Pap tests.  Hepatitis C blood test.** / For any individual with known risks for hepatitis C.  Skin self-exam. / Monthly.  Influenza vaccine. / Every year.  Tetanus, diphtheria, and acellular pertussis (Tdap, Td) vaccine.** / Consult your health care provider. Pregnant women should receive 1 dose of Tdap vaccine during each pregnancy. 1 dose of Td every 10 years.  Varicella vaccine.** / Consult your health care provider. Pregnant females who do not have evidence of immunity should receive the first dose after pregnancy.  HPV vaccine. / 3 doses over 6 months, if 93 and younger. The vaccine is not recommended for use in pregnant females. However, pregnancy testing is not needed before receiving a dose.  Measles, mumps, rubella (MMR) vaccine.** / You need at least 1 dose of MMR if you were born in 1957 or later. You may also need a 2nd dose. For females of childbearing age, rubella immunity should be determined. If there is no evidence of immunity, females who are not pregnant should be vaccinated. If there is no evidence of immunity, females who are  pregnant should delay immunization until after pregnancy.  Pneumococcal  13-valent conjugate (PCV13) vaccine.** / Consult your health care provider.  Pneumococcal polysaccharide (PPSV23) vaccine.** / 1 to 2 doses if you smoke cigarettes or if you have certain conditions.  Meningococcal vaccine.** / 1 dose if you are age 68 to 8 years and a Market researcher living in a residence hall, or have one of several medical conditions, you need to get vaccinated against meningococcal disease. You may also need additional booster doses.  Hepatitis A vaccine.** / Consult your health care provider.  Hepatitis B vaccine.** / Consult your health care provider.  Haemophilus influenzae type b (Hib) vaccine.** / Consult your health care provider. Ages 7 to 53 years  Blood pressure check.** / Every year.  Lipid and cholesterol check.** / Every 5 years beginning at age 25 years.  Lung cancer screening. / Every year if you are aged 11-80 years and have a 30-pack-year history of smoking and currently smoke or have quit within the past 15 years. Yearly screening is stopped once you have quit smoking for at least 15 years or develop a health problem that would prevent you from having lung cancer treatment.  Clinical breast exam.** / Every year after age 48 years.  BRCA-related cancer risk assessment.** / For women who have family members with a BRCA-related cancer (breast, ovarian, tubal, or peritoneal cancers).  Mammogram.** / Every year beginning at age 41 years and continuing for as long as you are in good health. Consult with your health care provider.  Pap test.** / Every 3 years starting at age 65 years through age 37 or 70 years with a history of 3 consecutive normal Pap tests.  HPV screening.** / Every 3 years from ages 72 years through ages 60 to 40 years with a history of 3 consecutive normal Pap tests.  Fecal occult blood test (FOBT) of stool. / Every year beginning at age 21 years and continuing until age 5 years. You may not need to do this test if you get  a colonoscopy every 10 years.  Flexible sigmoidoscopy or colonoscopy.** / Every 5 years for a flexible sigmoidoscopy or every 10 years for a colonoscopy beginning at age 35 years and continuing until age 48 years.  Hepatitis C blood test.** / For all people born from [birth year] through 1965 and any individual with known risks for hepatitis C.  Skin self-exam. / Monthly.  Influenza vaccine. / Every year.  Tetanus, diphtheria, and acellular pertussis (Tdap/Td) vaccine.** / Consult your health care provider. Pregnant women should receive 1 dose of Tdap vaccine during each pregnancy. 1 dose of Td every 10 years.  Varicella vaccine.** / Consult your health care provider. Pregnant females who do not have evidence of immunity should receive the first dose after pregnancy.  Zoster vaccine.** / 1 dose for adults aged 30 years or older.  Measles, mumps, rubella (MMR) vaccine.** / You need at least 1 dose of MMR if you were born in 1957 or later. You may also need a second dose. For females of childbearing age, rubella immunity should be determined. If there is no evidence of immunity, females who are not pregnant should be vaccinated. If there is no evidence of immunity, females who are pregnant should delay immunization until after pregnancy.  Pneumococcal 13-valent conjugate (PCV13) vaccine.** / Consult your health care provider.  Pneumococcal polysaccharide (PPSV23) vaccine.** / 1 to 2 doses if you smoke cigarettes or if you have certain conditions.  Meningococcal vaccine.** /  Consult your health care provider.  Hepatitis A vaccine.** / Consult your health care provider.  Hepatitis B vaccine.** / Consult your health care provider.  Haemophilus influenzae type b (Hib) vaccine.** / Consult your health care provider. Ages 64 years and over  Blood pressure check.** / Every year.  Lipid and cholesterol check.** / Every 5 years beginning at age 23 years.  Lung cancer screening. / Every year if you  are aged 16-80 years and have a 30-pack-year history of smoking and currently smoke or have quit within the past 15 years. Yearly screening is stopped once you have quit smoking for at least 15 years or develop a health problem that would prevent you from having lung cancer treatment.  Clinical breast exam.** / Every year after age 74 years.  BRCA-related cancer risk assessment.** / For women who have family members with a BRCA-related cancer (breast, ovarian, tubal, or peritoneal cancers).  Mammogram.** / Every year beginning at age 44 years and continuing for as long as you are in good health. Consult with your health care provider.  Pap test.** / Every 3 years starting at age 58 years through age 22 or 39 years with 3 consecutive normal Pap tests. Testing can be stopped between 65 and 70 years with 3 consecutive normal Pap tests and no abnormal Pap or HPV tests in the past 10 years.  HPV screening.** / Every 3 years from ages 64 years through ages 70 or 61 years with a history of 3 consecutive normal Pap tests. Testing can be stopped between 65 and 70 years with 3 consecutive normal Pap tests and no abnormal Pap or HPV tests in the past 10 years.  Fecal occult blood test (FOBT) of stool. / Every year beginning at age 40 years and continuing until age 27 years. You may not need to do this test if you get a colonoscopy every 10 years.  Flexible sigmoidoscopy or colonoscopy.** / Every 5 years for a flexible sigmoidoscopy or every 10 years for a colonoscopy beginning at age 7 years and continuing until age 32 years.  Hepatitis C blood test.** / For all people born from [birth year] through 1965 and any individual with known risks for hepatitis C.  Osteoporosis screening.** / A one-time screening for women ages 30 years and over and women at risk for fractures or osteoporosis.  Skin self-exam. / Monthly.  Influenza vaccine. / Every year.  Tetanus, diphtheria, and acellular pertussis (Tdap/Td)  vaccine.** / 1 dose of Td every 10 years.  Varicella vaccine.** / Consult your health care provider.  Zoster vaccine.** / 1 dose for adults aged 35 years or older.  Pneumococcal 13-valent conjugate (PCV13) vaccine.** / Consult your health care provider.  Pneumococcal polysaccharide (PPSV23) vaccine.** / 1 dose for all adults aged 46 years and older.  Meningococcal vaccine.** / Consult your health care provider.  Hepatitis A vaccine.** / Consult your health care provider.  Hepatitis B vaccine.** / Consult your health care provider.  Haemophilus influenzae type b (Hib) vaccine.** / Consult your health care provider. ** Family history and personal history of risk and conditions may change your health care provider's recommendations.   This information is not intended to replace advice given to you by your health care provider. Make sure you discuss any questions you have with your health care provider.   Document Released: 06/01/2001 Document Revised: 04/26/2014 Document Reviewed: 08/31/2010 Elsevier Interactive Patient Education [employer].

## 2016-02-13 ENCOUNTER — Other Ambulatory Visit (INDEPENDENT_AMBULATORY_CARE_PROVIDER_SITE_OTHER): Payer: Managed Care, Other (non HMO)

## 2016-02-13 DIAGNOSIS — Z0000 Encounter for general adult medical examination without abnormal findings: Secondary | ICD-10-CM

## 2016-02-13 DIAGNOSIS — B354 Tinea corporis: Secondary | ICD-10-CM

## 2016-02-13 DIAGNOSIS — I10 Essential (primary) hypertension: Secondary | ICD-10-CM

## 2016-02-13 LAB — LIPID PANEL
Cholesterol: 157 mg/dL (ref 0–200)
HDL: 57.4 mg/dL (ref >39.00)
LDL Cholesterol: 86 mg/dL (ref 0–99)
NONHDL: 99.67
Total CHOL/HDL Ratio: 3
Triglycerides: 67 mg/dL (ref 0.0–149.0)
VLDL: 13.4 mg/dL (ref 0.0–40.0)

## 2016-02-13 LAB — COMPREHENSIVE METABOLIC PANEL
ALT: 11 U/L (ref 0–35)
AST: 15 U/L (ref 0–37)
Albumin: 3.6 g/dL (ref 3.5–5.2)
Alkaline Phosphatase: 52 U/L (ref 39–117)
BUN: 14 mg/dL (ref 6–23)
CALCIUM: 9 mg/dL (ref 8.4–10.5)
CHLORIDE: 104 meq/L (ref 96–112)
CO2: 31 meq/L (ref 19–32)
CREATININE: 0.91 mg/dL (ref 0.40–1.20)
GFR: 82.51 mL/min (ref >60.00)
Glucose, Bld: 77 mg/dL (ref 70–99)
Potassium: 4 mEq/L (ref 3.5–5.1)
Sodium: 140 mEq/L (ref 135–145)
Total Bilirubin: 0.7 mg/dL (ref 0.2–1.2)
Total Protein: 6.8 g/dL (ref 6.0–8.3)

## 2016-02-13 LAB — CBC WITH DIFFERENTIAL/PLATELET
BASOS PCT: 0.8 % (ref 0.0–3.0)
Basophils Absolute: 0 10*3/uL (ref 0.0–0.1)
EOS ABS: 0.1 10*3/uL (ref 0.0–0.7)
Eosinophils Relative: 2.7 % (ref 0.0–5.0)
HEMATOCRIT: 35.8 % — AB (ref 36.0–46.0)
Hemoglobin: 11.9 g/dL — ABNORMAL LOW (ref 12.0–15.0)
LYMPHS ABS: 1.5 10*3/uL (ref 0.7–4.0)
Lymphocytes Relative: 34.4 % (ref 12.0–46.0)
MCHC: 33.4 g/dL (ref 30.0–36.0)
MCV: 84.2 fl (ref 78.0–100.0)
MONO ABS: 0.4 10*3/uL (ref 0.1–1.0)
Monocytes Relative: 8.1 % (ref 3.0–12.0)
NEUTROS ABS: 2.4 10*3/uL (ref 1.4–7.7)
NEUTROS PCT: 54 % (ref 43.0–77.0)
PLATELETS: 297 10*3/uL (ref 150.0–400.0)
RBC: 4.25 Mil/uL (ref 3.87–5.11)
RDW: 13.9 % (ref 11.5–15.5)
WBC: 4.4 10*3/uL (ref 4.0–10.5)

## 2016-02-16 LAB — TSH: TSH: 2.36 u[IU]/mL (ref 0.35–4.50)

## 2016-05-20 ENCOUNTER — Other Ambulatory Visit: Payer: Self-pay | Admitting: Family Medicine

## 2016-05-20 DIAGNOSIS — B354 Tinea corporis: Secondary | ICD-10-CM

## 2016-07-19 ENCOUNTER — Other Ambulatory Visit: Payer: Self-pay | Admitting: Family Medicine

## 2016-07-19 MED ORDER — KLOR-CON M10 10 MEQ PO TBCR: EXTENDED_RELEASE_TABLET | ORAL | 1 refills | Status: DC

## 2016-09-08 ENCOUNTER — Telehealth: Payer: Self-pay | Admitting: *Deleted

## 2016-09-08 DIAGNOSIS — B354 Tinea corporis: Secondary | ICD-10-CM

## 2016-09-08 NOTE — Telephone Encounter (Signed)
Pharmacy requesting refill on Naftin cream.  She was treated back in October for Tinea corporis.  Last refilled 05/20/16.  Would you like to refill again?

## 2016-09-09 MED ORDER — NAFTIFINE HCL 1 % EX CREA: TOPICAL_CREAM | Freq: Every day | CUTANEOUS | 0 refills | Status: DC

## 2016-09-09 NOTE — Telephone Encounter (Signed)
Ok to refill 

## 2016-09-09 NOTE — Telephone Encounter (Signed)
rx sent in 

## 2016-10-06 ENCOUNTER — Telehealth: Payer: Self-pay | Admitting: Family Medicine

## 2016-10-06 NOTE — Telephone Encounter (Signed)
Pharmacy fax ok to change klor con to generic equivlent--klor con not preferred by their distributor and tough to get.

## 2016-10-07 NOTE — Telephone Encounter (Signed)
Faxed ok per PCP

## 2016-10-07 NOTE — Telephone Encounter (Signed)
ok 

## 2016-10-26 ENCOUNTER — Other Ambulatory Visit: Payer: Self-pay | Admitting: Family Medicine

## 2016-10-26 DIAGNOSIS — I10 Essential (primary) hypertension: Secondary | ICD-10-CM

## 2016-11-18 ENCOUNTER — Other Ambulatory Visit: Payer: Self-pay | Admitting: Family Medicine

## 2017-01-13 ENCOUNTER — Other Ambulatory Visit: Payer: Self-pay | Admitting: Family Medicine

## 2017-01-13 DIAGNOSIS — R1013 Epigastric pain: Secondary | ICD-10-CM

## 2017-03-02 LAB — HM MAMMOGRAPHY

## 2017-03-03 ENCOUNTER — Other Ambulatory Visit: Payer: Self-pay | Admitting: Obstetrics & Gynecology

## 2017-03-03 DIAGNOSIS — R928 Other abnormal and inconclusive findings on diagnostic imaging of breast: Secondary | ICD-10-CM

## 2017-03-04 ENCOUNTER — Other Ambulatory Visit: Payer: Self-pay | Admitting: Family Medicine

## 2017-03-07 ENCOUNTER — Ambulatory Visit
Admission: RE | Admit: 2017-03-07 | Discharge: 2017-03-07 | Disposition: A | Discharge status: Home / Self Care | Payer: Managed Care, Other (non HMO) | Source: Ambulatory Visit | Attending: Obstetrics & Gynecology | Admitting: Obstetrics & Gynecology

## 2017-03-07 DIAGNOSIS — R928 Other abnormal and inconclusive findings on diagnostic imaging of breast: Secondary | ICD-10-CM

## 2017-05-05 ENCOUNTER — Telehealth: Payer: Self-pay | Admitting: Family Medicine

## 2017-05-05 NOTE — Telephone Encounter (Signed)
LR: 08/08/14 #[id number]  OV: On 02/12/16 states "con't Losartan"  Sending back to provider

## 2017-05-05 NOTE — Telephone Encounter (Signed)
Copied from [place]. Topic: Quick Communication - See Telephone Encounter >> May 05, 2017  4:04 PM [name] wrote: CRM for notification. See Telephone encounter for:   Pt is out of amLODipine (NORVASC) 10 MG tablet and has appt next Tuesday.  Asking if can get a few to last here.  [name] says they do not see a prescription.   05/05/17.

## 2017-05-09 NOTE — Telephone Encounter (Signed)
Patient has appt tomorrow

## 2017-05-10 ENCOUNTER — Ambulatory Visit (INDEPENDENT_AMBULATORY_CARE_PROVIDER_SITE_OTHER): Payer: Managed Care, Other (non HMO) | Admitting: Family Medicine

## 2017-05-10 ENCOUNTER — Encounter: Payer: Self-pay | Admitting: Family Medicine

## 2017-05-10 VITALS — BP 145/72 | HR 86 | Temp 97.6°F | Resp 16 | Ht 68.0 in | Wt 294.6 lb

## 2017-05-10 DIAGNOSIS — R1013 Epigastric pain: Secondary | ICD-10-CM

## 2017-05-10 DIAGNOSIS — Z23 Encounter for immunization: Secondary | ICD-10-CM | POA: Diagnosis not present

## 2017-05-10 DIAGNOSIS — E876 Hypokalemia: Secondary | ICD-10-CM | POA: Diagnosis not present

## 2017-05-10 DIAGNOSIS — I10 Essential (primary) hypertension: Secondary | ICD-10-CM | POA: Diagnosis not present

## 2017-05-10 DIAGNOSIS — J302 Other seasonal allergic rhinitis: Secondary | ICD-10-CM | POA: Diagnosis not present

## 2017-05-10 MED ORDER — LOSARTAN POTASSIUM-HCTZ 100-25 MG PO TABS: 1.0000 | ORAL_TABLET | Freq: Every day | ORAL | 3 refills | Status: DC

## 2017-05-10 MED ORDER — OMEPRAZOLE 20 MG PO CPDR: 20.0000 mg | DELAYED_RELEASE_CAPSULE | Freq: Every day | ORAL | 3 refills | Status: DC

## 2017-05-10 MED ORDER — POTASSIUM CHLORIDE CRYS ER 10 MEQ PO TBCR: 20.0000 meq | EXTENDED_RELEASE_TABLET | Freq: Two times a day (BID) | ORAL | 1 refills | Status: DC

## 2017-05-10 MED ORDER — AMLODIPINE BESYLATE 10 MG PO TABS: 10.0000 mg | ORAL_TABLET | Freq: Every day | ORAL | 3 refills | Status: DC

## 2017-05-10 MED ORDER — FLUTICASONE PROPIONATE 50 MCG/ACT NA SUSP: NASAL | 2 refills | Status: DC

## 2017-05-10 NOTE — Progress Notes (Signed)
Patient ID: [name], female    DOB: [birth year]  Age: 57 y.o. MRN: [id number]    Subjective:  Subjective  HPI FAJR FIFE presents for bp f/u-- she has been out of her amolodapine about 3-4 weeks .     Review of Systems  Constitutional: Negative for activity change, appetite change, diaphoresis, fatigue and unexpected weight change.  Eyes: Negative for pain, redness and visual disturbance.  Respiratory: Negative for cough, chest tightness, shortness of breath and wheezing.   Cardiovascular: Negative for chest pain, palpitations and leg swelling.  Endocrine: Negative for cold intolerance, heat intolerance, polydipsia, polyphagia and polyuria.  Genitourinary: Negative for difficulty urinating, dysuria and frequency.  Neurological: Negative for dizziness, light-headedness, numbness and headaches.  Psychiatric/Behavioral: Negative for behavioral problems and dysphoric mood. The patient is not nervous/anxious.     History Past Medical History:  Diagnosis Date  . Abdominal pain, other specified site   . Acute upper respiratory infections of unspecified site   . Benign neoplasm of skin, site unspecified   . Disturbance of skin sensation   . Diverticulosis of colon (without mention of hemorrhage)   . Family history of diabetes mellitus   . Family history of ischemic heart disease   . Family history of malignant neoplasm of genital organ, other   . GERD (gastroesophageal reflux disease)   . Hypertension   . Hypokalemia   . Left shoulder pain   . Post-menopausal   . Sprain and strain of unspecified site of knee and leg   . Sprain of neck   . Unspecified essential hypertension     She has a past surgical history that includes Total abdominal hysterectomy w/ bilateral salpingoophorectomy (2002); Bunionectomy (2005); Shoulder surgery; and Abdominal hysterectomy.   Her family history includes Breast cancer in her cousin and maternal aunt; Dementia in her mother; Diabetes in her  brother and mother; Hypertension in her father, mother, and sister; Prostate cancer in her father; Stroke in her maternal aunt and mother.She reports that  has never smoked. she has never used smokeless tobacco. She reports that she does not drink alcohol or use drugs.  Current Outpatient Medications on File Prior to Visit  Medication Sig Dispense Refill  . Cyanocobalamin (VITAMIN B-12 PO) Take 1 tablet by mouth daily.    . diclofenac (VOLTAREN) 75 MG EC tablet Take 1 tablet (75 mg total) by mouth 2 (two) times daily. 50 tablet 2  . estradiol (ESTRACE) 2 MG tablet Take 1 mg by mouth as needed.     . hydroxychloroquine (PLAQUENIL) 200 MG tablet Take 200 mg by mouth 2 (two) times daily.      Marland Kitchen ibuprofen (ADVIL,MOTRIN) 800 MG tablet Take 1 tablet (800 mg total) by mouth every 8 (eight) hours as needed. 30 tablet 0  . naftifine (NAFTIN) 1 % cream Apply topically daily. 60 g 0  . RESTASIS 0.05 % ophthalmic emulsion Place 1 drop into both eyes daily.      No current facility-administered medications on file prior to visit.      Objective:  Objective  Physical Exam  Constitutional: She is oriented to person, place, and time. She appears well-developed and well-nourished.  HENT:  Head: Normocephalic and atraumatic.  Eyes: Conjunctivae and EOM are normal.  Neck: Normal range of motion. Neck supple. No JVD present. Carotid bruit is not present. No thyromegaly present.  Cardiovascular: Normal rate, regular rhythm and normal heart sounds.  No murmur heard. Pulmonary/Chest: Effort normal and breath sounds normal. No  respiratory distress. She has no wheezes. She has no rales. She exhibits no tenderness.  Musculoskeletal: She exhibits no edema.  Neurological: She is alert and oriented to person, place, and time.  Psychiatric: She has a normal mood and affect.  Nursing note and vitals reviewed.  BP (!) 145/72 (BP Location: Left Arm, Patient Position: Sitting, Cuff Size: Large)   Pulse 86   Temp 97.6  F (36.4 C) (Oral)   Resp 16   Ht 5\' 8"  (1.727 m)   Wt 294 lb 9.6 oz (133.6 kg)   SpO2 99%   BMI 44.79 kg/m  Wt Readings from Last 3 Encounters:  05/10/17 294 lb 9.6 oz (133.6 kg)  02/12/16 288 lb 12.8 oz (131 kg)  08/11/15 295 lb 9.6 oz (134.1 kg)     Lab Results  Component Value Date   WBC 4.4 02/13/2016   HGB 11.9 (L) 02/13/2016   HCT 35.8 (L) 02/13/2016   PLT 297.0 02/13/2016   GLUCOSE 77 02/13/2016   CHOL 157 02/13/2016   TRIG 67.0 02/13/2016   HDL 57.40 02/13/2016   LDLDIRECT 128.5 04/30/2008   LDLCALC 86 02/13/2016   ALT 11 02/13/2016   AST 15 02/13/2016   NA 140 02/13/2016   K 4.0 02/13/2016   CL 104 02/13/2016   CREATININE 0.91 02/13/2016   BUN 14 02/13/2016   CO2 31 02/13/2016   TSH 2.36 02/13/2016    US Breast Ltd Uni Right Inc Axilla  Result Date: 03/07/2017 CLINICAL DATA:  Patient recalled from screening for right breast mass. EXAM: 2D DIGITAL DIAGNOSTIC RIGHT MAMMOGRAM WITH CAD AND ADJUNCT TOMO ULTRASOUND RIGHT BREAST COMPARISON:  Previous exam(s). ACR Breast Density Category c: The breast tissue is heterogeneously dense, which may obscure small masses. FINDINGS: Oval circumscribed low-density mass within the upper inner right breast, further evaluated with spot CC tomosynthesis and full paddle true lateral views of the right breast. Mammographic images were processed with CAD. On physical exam, I palpate no discrete mass within the upper inner right breast. Targeted ultrasound is performed, showing a 7 x 4 x 7 mm cyst right breast 2 o'clock position 4 cm from the nipple. IMPRESSION: Right breast cyst. No mammographic evidence for malignancy. RECOMMENDATION: Screening mammogram in one year.(Code:SM-B-01Y) I have discussed the findings and recommendations with the patient. Results were also provided in writing at the conclusion of the visit. If applicable, a reminder letter will be sent to the patient regarding the next appointment. BI-RADS CATEGORY  2: Benign.  Electronically Signed   By: Lovey Newcomer M.D.   On: 03/07/2017 14:59   Mm Diag Breast Tomo Uni Right  Result Date: 03/07/2017 CLINICAL DATA:  Patient recalled from screening for right breast mass. EXAM: 2D DIGITAL DIAGNOSTIC RIGHT MAMMOGRAM WITH CAD AND ADJUNCT TOMO ULTRASOUND RIGHT BREAST COMPARISON:  Previous exam(s). ACR Breast Density Category c: The breast tissue is heterogeneously dense, which may obscure small masses. FINDINGS: Oval circumscribed low-density mass within the upper inner right breast, further evaluated with spot CC tomosynthesis and full paddle true lateral views of the right breast. Mammographic images were processed with CAD. On physical exam, I palpate no discrete mass within the upper inner right breast. Targeted ultrasound is performed, showing a 7 x 4 x 7 mm cyst right breast 2 o'clock position 4 cm from the nipple. IMPRESSION: Right breast cyst. No mammographic evidence for malignancy. RECOMMENDATION: Screening mammogram in one year.(Code:SM-B-01Y) I have discussed the findings and recommendations with the patient. Results were also provided in writing  at the conclusion of the visit. If applicable, a reminder letter will be sent to the patient regarding the next appointment. BI-RADS CATEGORY  2: Benign. Electronically Signed   By: Lovey Newcomer M.D.   On: 03/07/2017 14:59     Assessment & Plan:  Plan  I have discontinued Vic Blackbird. [place]'s HYDROcodone-acetaminophen and potassium chloride. I have also changed her losartan-hydrochlorothiazide, potassium chloride, and omeprazole. Additionally, I am having her maintain her estradiol, hydroxychloroquine, RESTASIS, diclofenac, Cyanocobalamin (VITAMIN B-12 PO), ibuprofen, naftifine, fluticasone, and amLODipine.  Meds ordered this encounter  Medications  . losartan-hydrochlorothiazide (HYZAAR) 100-25 MG tablet    Sig: Take 1 tablet by mouth daily.    Dispense:  90 tablet    Refill:  3  . potassium chloride (K-DUR,KLOR-CON) 10 MEQ  tablet    Sig: Take 2 tablets (20 mEq total) by mouth 2 (two) times daily.    Dispense:  120 tablet    Refill:  1  . omeprazole (PRILOSEC) 20 MG capsule    Sig: Take 1 capsule (20 mg total) by mouth daily.    Dispense:  90 capsule    Refill:  3  . fluticasone (FLONASE) 50 MCG/ACT nasal spray    Sig: INSTILL TWO SPRAYS INTO BOTH NOSTRIL DAILY.    Dispense:  16 g    Refill:  2  . amLODipine (NORVASC) 10 MG tablet    Sig: Take 1 tablet (10 mg total) by mouth daily.    Dispense:  90 tablet    Refill:  3    D/C PREVIOUS SCRIPTS FOR THIS MEDICATION    Problem List Items Addressed This Visit      Unprioritized   Essential hypertension    Well controlled, no changes to meds. Encouraged heart healthy diet such as the DASH diet and exercise as tolerated.        Relevant Medications   losartan-hydrochlorothiazide (HYZAAR) 100-25 MG tablet   amLODipine (NORVASC) 10 MG tablet   Other Relevant Orders   Lipid panel   Comprehensive metabolic panel    Other Visit Diagnoses    Seasonal allergies    -  Primary   Relevant Medications   fluticasone (FLONASE) 50 MCG/ACT nasal spray   Dyspepsia       Relevant Medications   omeprazole (PRILOSEC) 20 MG capsule   Hypokalemia       Relevant Medications   potassium chloride (K-DUR,KLOR-CON) 10 MEQ tablet   Other Relevant Orders   Comprehensive metabolic panel   Need for vaccination       Relevant Orders   Flu Vaccine QUAD 6+ mos PF IM (Fluarix Quad PF) (Completed)      Follow-up: Return in about 3 months (around 08/08/2017), or if symptoms worsen or fail to improve, for fasting, annual exam.  [name], DO

## 2017-05-10 NOTE — Assessment & Plan Note (Signed)
Well controlled, no changes to meds. Encouraged heart healthy diet such as the DASH diet and exercise as tolerated.  °

## 2017-05-10 NOTE — Patient Instructions (Signed)
DASH Eating Plan DASH stands for "Dietary Approaches to Stop Hypertension." The DASH eating plan is a healthy eating plan that has been shown to reduce high blood pressure (hypertension). It may also reduce your risk for type 2 diabetes, heart disease, and stroke. The DASH eating plan may also help with weight loss. What are tips for following this plan? General guidelines  Avoid eating more than 2,300 mg (milligrams) of salt (sodium) a day. If you have hypertension, you may need to reduce your sodium intake to 1,500 mg a day.  Limit alcohol intake to no more than 1 drink a day for nonpregnant women and 2 drinks a day for men. One drink equals 12 oz of beer, 5 oz of wine, or 1 oz of hard liquor.  Work with your health care provider to maintain a healthy body weight or to lose weight. Ask what an ideal weight is for you.  Get at least 30 minutes of exercise that causes your heart to beat faster (aerobic exercise) most days of the week. Activities may include walking, swimming, or biking.  Work with your health care provider or diet and nutrition specialist (dietitian) to adjust your eating plan to your individual calorie needs. Reading food labels  Check food labels for the amount of sodium per serving. Choose foods with less than 5 percent of the Daily Value of sodium. Generally, foods with less than 300 mg of sodium per serving fit into this eating plan.  To find whole grains, look for the word "whole" as the first word in the ingredient list. Shopping  Buy products labeled as "low-sodium" or "no salt added."  Buy fresh foods. Avoid canned foods and premade or frozen meals. Cooking  Avoid adding salt when cooking. Use salt-free seasonings or herbs instead of table salt or sea salt. Check with your health care provider or pharmacist before using salt substitutes.  Do not fry foods. Cook foods using healthy methods such as baking, boiling, grilling, and broiling instead.  Cook with  heart-healthy oils, such as olive, canola, soybean, or sunflower oil. Meal planning   Eat a balanced diet that includes: ? 5 or more servings of fruits and vegetables each day. At each meal, try to fill half of your plate with fruits and vegetables. ? Up to 6-8 servings of whole grains each day. ? Less than 6 oz of lean meat, poultry, or fish each day. A 3-oz serving of meat is about the same size as a deck of cards. One egg equals 1 oz. ? 2 servings of low-fat dairy each day. ? A serving of nuts, seeds, or beans 5 times each week. ? Heart-healthy fats. Healthy fats called Omega-3 fatty acids are found in foods such as flaxseeds and coldwater fish, like sardines, salmon, and mackerel.  Limit how much you eat of the following: ? Canned or prepackaged foods. ? Food that is high in trans fat, such as fried foods. ? Food that is high in saturated fat, such as fatty meat. ? Sweets, desserts, sugary drinks, and other foods with added sugar. ? Full-fat dairy products.  Do not salt foods before eating.  Try to eat at least 2 vegetarian meals each week.  Eat more home-cooked food and less restaurant, buffet, and fast food.  When eating at a restaurant, ask that your food be prepared with less salt or no salt, if possible. What foods are recommended? The items listed may not be a complete list. Talk with your dietitian about what   dietary choices are best for you. Grains Whole-grain or whole-wheat bread. Whole-grain or whole-wheat pasta. Brown rice. Oatmeal. Quinoa. Bulgur. Whole-grain and low-sodium cereals. Pita bread. Low-fat, low-sodium crackers. Whole-wheat flour tortillas. Vegetables Fresh or frozen vegetables (raw, steamed, roasted, or grilled). Low-sodium or reduced-sodium tomato and vegetable juice. Low-sodium or reduced-sodium tomato sauce and tomato paste. Low-sodium or reduced-sodium canned vegetables. Fruits All fresh, dried, or frozen fruit. Canned fruit in natural juice (without  added sugar). Meat and other protein foods Skinless chicken or turkey. Ground chicken or turkey. Pork with fat trimmed off. Fish and seafood. Egg whites. Dried beans, peas, or lentils. Unsalted nuts, nut butters, and seeds. Unsalted canned beans. Lean cuts of beef with fat trimmed off. Low-sodium, lean deli meat. Dairy Low-fat (1%) or fat-free (skim) milk. Fat-free, low-fat, or reduced-fat cheeses. Nonfat, low-sodium ricotta or cottage cheese. Low-fat or nonfat yogurt. Low-fat, low-sodium cheese. Fats and oils Soft margarine without trans fats. Vegetable oil. Low-fat, reduced-fat, or light mayonnaise and salad dressings (reduced-sodium). Canola, safflower, olive, soybean, and sunflower oils. Avocado. Seasoning and other foods Herbs. Spices. Seasoning mixes without salt. Unsalted popcorn and pretzels. Fat-free sweets. What foods are not recommended? The items listed may not be a complete list. Talk with your dietitian about what dietary choices are best for you. Grains Baked goods made with fat, such as croissants, muffins, or some breads. Dry pasta or rice meal packs. Vegetables Creamed or fried vegetables. Vegetables in a cheese sauce. Regular canned vegetables (not low-sodium or reduced-sodium). Regular canned tomato sauce and paste (not low-sodium or reduced-sodium). Regular tomato and vegetable juice (not low-sodium or reduced-sodium). Pickles. Olives. Fruits Canned fruit in a light or heavy syrup. Fried fruit. Fruit in cream or butter sauce. Meat and other protein foods Fatty cuts of meat. Ribs. Fried meat. Bacon. Sausage. Bologna and other processed lunch meats. Salami. Fatback. Hotdogs. Bratwurst. Salted nuts and seeds. Canned beans with added salt. Canned or smoked fish. Whole eggs or egg yolks. Chicken or turkey with skin. Dairy Whole or 2% milk, cream, and half-and-half. Whole or full-fat cream cheese. Whole-fat or sweetened yogurt. Full-fat cheese. Nondairy creamers. Whipped toppings.  Processed cheese and cheese spreads. Fats and oils Butter. Stick margarine. Lard. Shortening. Ghee. Bacon fat. Tropical oils, such as coconut, palm kernel, or palm oil. Seasoning and other foods Salted popcorn and pretzels. Onion salt, garlic salt, seasoned salt, table salt, and sea salt. Worcestershire sauce. Tartar sauce. Barbecue sauce. Teriyaki sauce. Soy sauce, including reduced-sodium. Steak sauce. Canned and packaged gravies. Fish sauce. Oyster sauce. Cocktail sauce. Horseradish that you find on the shelf. Ketchup. Mustard. Meat flavorings and tenderizers. Bouillon cubes. Hot sauce and Tabasco sauce. Premade or packaged marinades. Premade or packaged taco seasonings. Relishes. Regular salad dressings. Where to find more information:  National Heart, Lung, and Blood Institute: www.nhlbi.nih.gov  American Heart Association: www.heart.org Summary  The DASH eating plan is a healthy eating plan that has been shown to reduce high blood pressure (hypertension). It may also reduce your risk for type 2 diabetes, heart disease, and stroke.  With the DASH eating plan, you should limit salt (sodium) intake to 2,300 mg a day. If you have hypertension, you may need to reduce your sodium intake to 1,500 mg a day.  When on the DASH eating plan, aim to eat more fresh fruits and vegetables, whole grains, lean proteins, low-fat dairy, and heart-healthy fats.  Work with your health care provider or diet and nutrition specialist (dietitian) to adjust your eating plan to your individual   calorie needs. This information is not intended to replace advice given to you by your health care provider. Make sure you discuss any questions you have with your health care provider. Document Released: 03/25/2011 Document Revised: 03/29/2016 Document Reviewed: 03/29/2016 Elsevier Interactive Patient Education  2018 [employer]  

## 2017-05-11 LAB — COMPREHENSIVE METABOLIC PANEL
ALK PHOS: 60 U/L (ref 39–117)
ALT: 16 U/L (ref 0–35)
AST: 16 U/L (ref 0–37)
Albumin: 4 g/dL (ref 3.5–5.2)
BILIRUBIN TOTAL: 0.6 mg/dL (ref 0.2–1.2)
BUN: 18 mg/dL (ref 6–23)
CALCIUM: 9.3 mg/dL (ref 8.4–10.5)
CO2: 25 mEq/L (ref 19–32)
Chloride: 105 mEq/L (ref 96–112)
Creatinine, Ser: 1.02 mg/dL (ref 0.40–1.20)
GFR: 72 mL/min (ref >60.00)
GLUCOSE: 84 mg/dL (ref 70–99)
Potassium: 3.4 mEq/L — ABNORMAL LOW (ref 3.5–5.1)
Sodium: 143 mEq/L (ref 135–145)
TOTAL PROTEIN: 7.8 g/dL (ref 6.0–8.3)

## 2017-05-11 LAB — LIPID PANEL
Cholesterol: 186 mg/dL (ref 0–200)
HDL: 74.6 mg/dL (ref >39.00)
LDL Cholesterol: 88 mg/dL (ref 0–99)
NonHDL: 110.95
TRIGLYCERIDES: 113 mg/dL (ref 0.0–149.0)
Total CHOL/HDL Ratio: 2
VLDL: 22.6 mg/dL (ref 0.0–40.0)

## 2017-08-04 ENCOUNTER — Encounter: Payer: Self-pay | Admitting: Family Medicine

## 2017-08-04 ENCOUNTER — Ambulatory Visit (INDEPENDENT_AMBULATORY_CARE_PROVIDER_SITE_OTHER): Payer: Managed Care, Other (non HMO) | Admitting: Family Medicine

## 2017-08-04 VITALS — BP 136/76 | HR 99 | Temp 98.0°F | Ht 68.0 in | Wt 294.4 lb

## 2017-08-04 DIAGNOSIS — L03115 Cellulitis of right lower limb: Secondary | ICD-10-CM

## 2017-08-04 MED ORDER — CEPHALEXIN 500 MG PO CAPS: 500.0000 mg | ORAL_CAPSULE | Freq: Three times a day (TID) | ORAL | 0 refills | Status: AC

## 2017-08-04 NOTE — Progress Notes (Signed)
Subjective:  Patient ID: [name], female    DOB: [birth year]  Age: 57 y.o. MRN: [id number]  CC: swollen right knee   HPI MEGHA AGNES presents for evaluation of a nontender erythematous and pruritic swelling around her anterior right knee.  There is been no fever or injury to her knee.  She does not believe she is cut herself shaving.  She does wonder about an insect bite but does not always remember receiving an insect bite.  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  She has had no burning with urination or unusual discharge.  No recent changes in her medicines.  She is treated this with a lotion but has used this lotion about her body before without problem.  Outpatient Medications Prior to Visit  Medication Sig Dispense Refill  . amLODipine (NORVASC) 10 MG tablet Take 1 tablet (10 mg total) by mouth daily. 90 tablet 3  . Cyanocobalamin (VITAMIN B-12 PO) Take 1 tablet by mouth daily.    . diclofenac (VOLTAREN) 75 MG EC tablet Take 1 tablet (75 mg total) by mouth 2 (two) times daily. 50 tablet 2  . estradiol (ESTRACE) 2 MG tablet Take 1 mg by mouth as needed.     . fluticasone (FLONASE) 50 MCG/ACT nasal spray INSTILL TWO SPRAYS INTO BOTH NOSTRIL DAILY. 16 g 2  . hydroxychloroquine (PLAQUENIL) 200 MG tablet Take 200 mg by mouth 2 (two) times daily.      Marland Kitchen ibuprofen (ADVIL,MOTRIN) 800 MG tablet Take 1 tablet (800 mg total) by mouth every 8 (eight) hours as needed. 30 tablet 0  . losartan-hydrochlorothiazide (HYZAAR) 100-25 MG tablet Take 1 tablet by mouth daily. 90 tablet 3  . naftifine (NAFTIN) 1 % cream Apply topically daily. 60 g 0  . omeprazole (PRILOSEC) 20 MG capsule Take 1 capsule (20 mg total) by mouth daily. 90 capsule 3  . potassium chloride (K-DUR,KLOR-CON) 10 MEQ tablet Take 2 tablets (20 mEq total) by mouth 2 (two) times daily. 120 tablet 1  . RESTASIS 0.05 % ophthalmic emulsion Place 1 drop into both eyes daily.       No facility-administered medications prior to visit.     ROS Review of Systems  Constitutional: Negative for chills and fever.  Eyes: Negative for photophobia and visual disturbance.  Respiratory: Negative.   Cardiovascular: Negative.   Gastrointestinal: Negative.   Endocrine: Negative for polyphagia and polyuria.  Genitourinary: Negative for dysuria, frequency and vaginal discharge.  Musculoskeletal: Negative for arthralgias, gait problem and joint swelling.  Skin: Positive for color change. Negative for rash and wound.  Allergic/Immunologic: Negative for immunocompromised state.  Neurological: Negative for weakness and headaches.  Hematological: Does not bruise/bleed easily.  Psychiatric/Behavioral: Negative.     Objective:  BP 136/76 (BP Location: Right Arm, Patient Position: Sitting, Cuff Size: Large)   Pulse 99   Temp 98 F (36.7 C) (Oral)   Ht 5\' 8"  (1.727 m)   Wt 294 lb 6 oz (133.5 kg)   SpO2 97%   BMI 44.76 kg/m   BP Readings from Last 3 Encounters:  08/04/17 136/76  05/10/17 (!) 145/72  02/12/16 122/70    Wt Readings from Last 3 Encounters:  08/04/17 294 lb 6 oz (133.5 kg)  05/10/17 294 lb 9.6 oz (133.6 kg)  02/12/16 288 lb 12.8 oz (131 kg)    Physical Exam  Constitutional: She is oriented to person, place, and time.  She appears well-developed and well-nourished. No distress.  HENT:  Head: Normocephalic and atraumatic.  Right Ear: External ear normal.  Left Ear: External ear normal.  Neck: No JVD present. No tracheal deviation present.  Pulmonary/Chest: Effort normal.  Musculoskeletal:       Right knee: She exhibits swelling and erythema. She exhibits normal range of motion, no effusion, no ecchymosis, normal patellar mobility and no bony tenderness. No tenderness found. No medial joint line, no lateral joint line and no patellar tendon tenderness noted.       Legs: Neurological: She is alert and oriented to person, place, and time.  Skin: Skin is  warm and dry. No rash noted. She is not diaphoretic. There is erythema. No pallor.     Psychiatric: She has a normal mood and affect. Her behavior is normal.    Lab Results  Component Value Date   WBC 4.4 02/13/2016   HGB 11.9 (L) 02/13/2016   HCT 35.8 (L) 02/13/2016   PLT 297.0 02/13/2016   GLUCOSE 84 05/10/2017   CHOL 186 05/10/2017   TRIG 113.0 05/10/2017   HDL 74.60 05/10/2017   LDLDIRECT 128.5 04/30/2008   LDLCALC 88 05/10/2017   ALT 16 05/10/2017   AST 16 05/10/2017   NA 143 05/10/2017   K 3.4 (L) 05/10/2017   CL 105 05/10/2017   CREATININE 1.02 05/10/2017   BUN 18 05/10/2017   CO2 25 05/10/2017   TSH 2.36 02/13/2016    US Breast Ltd Uni Right Inc Axilla  Result Date: 03/07/2017 CLINICAL DATA:  Patient recalled from screening for right breast mass. EXAM: 2D DIGITAL DIAGNOSTIC RIGHT MAMMOGRAM WITH CAD AND ADJUNCT TOMO ULTRASOUND RIGHT BREAST COMPARISON:  Previous exam(s). ACR Breast Density Category c: The breast tissue is heterogeneously dense, which may obscure small masses. FINDINGS: Oval circumscribed low-density mass within the upper inner right breast, further evaluated with spot CC tomosynthesis and full paddle true lateral views of the right breast. Mammographic images were processed with CAD. On physical exam, I palpate no discrete mass within the upper inner right breast. Targeted ultrasound is performed, showing a 7 x 4 x 7 mm cyst right breast 2 o'clock position 4 cm from the nipple. IMPRESSION: Right breast cyst. No mammographic evidence for malignancy. RECOMMENDATION: Screening mammogram in one year.(Code:SM-B-01Y) I have discussed the findings and recommendations with the patient. Results were also provided in writing at the conclusion of the visit. If applicable, a reminder letter will be sent to the patient regarding the next appointment. BI-RADS CATEGORY  2: Benign. Electronically Signed   By: Lovey Newcomer M.D.   On: 03/07/2017 14:59   Mm Diag Breast Tomo Uni  Right  Result Date: 03/07/2017 CLINICAL DATA:  Patient recalled from screening for right breast mass. EXAM: 2D DIGITAL DIAGNOSTIC RIGHT MAMMOGRAM WITH CAD AND ADJUNCT TOMO ULTRASOUND RIGHT BREAST COMPARISON:  Previous exam(s). ACR Breast Density Category c: The breast tissue is heterogeneously dense, which may obscure small masses. FINDINGS: Oval circumscribed low-density mass within the upper inner right breast, further evaluated with spot CC tomosynthesis and full paddle true lateral views of the right breast. Mammographic images were processed with CAD. On physical exam, I palpate no discrete mass within the upper inner right breast. Targeted ultrasound is performed, showing a 7 x 4 x 7 mm cyst right breast 2 o'clock position 4 cm from the nipple. IMPRESSION: Right breast cyst. No mammographic evidence for malignancy. RECOMMENDATION: Screening mammogram in one year.(Code:SM-B-01Y) I have discussed the findings and recommendations with  the patient. Results were also provided in writing at the conclusion of the visit. If applicable, a reminder letter will be sent to the patient regarding the next appointment. BI-RADS CATEGORY  2: Benign. Electronically Signed   By: Lovey Newcomer M.D.   On: 03/07/2017 14:59    Assessment & Plan:   Dae was seen today for swollen right knee.  Diagnoses and all orders for this visit:  Cellulitis of right lower extremity -     cephALEXin (KEFLEX) 500 MG capsule; Take 1 capsule (500 mg total) by mouth 3 (three) times daily for 10 days.   I am having Lourena Simmonds start on cephALEXin. I am also having her maintain her estradiol, hydroxychloroquine, RESTASIS, diclofenac, Cyanocobalamin (VITAMIN B-12 PO), ibuprofen, naftifine, losartan-hydrochlorothiazide, potassium chloride, omeprazole, fluticasone, and amLODipine.  Meds ordered this encounter  Medications  . cephALEXin (KEFLEX) 500 MG capsule    Sig: Take 1 capsule (500 mg total) by mouth 3 (three) times daily for 10  days.    Dispense:  30 capsule    Refill:  0   Information was given to the patient about cellulitis.  She will follow-up within a week if not improving.  Follow-up: Return follow up with your doctor if not improving wihtin the week.Libby Maw, MD

## 2017-08-04 NOTE — Patient Instructions (Signed)

## 2017-08-15 ENCOUNTER — Encounter: Payer: Managed Care, Other (non HMO) | Admitting: Family Medicine

## 2017-09-17 ENCOUNTER — Other Ambulatory Visit: Payer: Self-pay | Admitting: Family Medicine

## 2017-09-17 DIAGNOSIS — E876 Hypokalemia: Secondary | ICD-10-CM

## 2017-10-13 ENCOUNTER — Ambulatory Visit (INDEPENDENT_AMBULATORY_CARE_PROVIDER_SITE_OTHER): Payer: Managed Care, Other (non HMO) | Admitting: Family Medicine

## 2017-10-13 ENCOUNTER — Encounter: Payer: Self-pay | Admitting: Family Medicine

## 2017-10-13 VITALS — BP 128/76 | HR 75 | Temp 98.4°F | Resp 16 | Ht 68.0 in | Wt 300.0 lb

## 2017-10-13 DIAGNOSIS — I10 Essential (primary) hypertension: Secondary | ICD-10-CM | POA: Diagnosis not present

## 2017-10-13 DIAGNOSIS — Z114 Encounter for screening for human immunodeficiency virus [HIV]: Secondary | ICD-10-CM

## 2017-10-13 DIAGNOSIS — M329 Systemic lupus erythematosus, unspecified: Secondary | ICD-10-CM | POA: Diagnosis not present

## 2017-10-13 DIAGNOSIS — Z0000 Encounter for general adult medical examination without abnormal findings: Secondary | ICD-10-CM

## 2017-10-13 DIAGNOSIS — Z6841 Body Mass Index (BMI) 40.0 and over, adult: Secondary | ICD-10-CM

## 2017-10-13 DIAGNOSIS — E785 Hyperlipidemia, unspecified: Secondary | ICD-10-CM | POA: Diagnosis not present

## 2017-10-13 MED ORDER — HYDROXYCHLOROQUINE SULFATE 200 MG PO TABS: 200.0000 mg | ORAL_TABLET | Freq: Two times a day (BID) | ORAL | Status: DC

## 2017-10-13 NOTE — Assessment & Plan Note (Signed)
Well controlled, no changes to meds. Encouraged heart healthy diet such as the DASH diet and exercise as tolerated.  °

## 2017-10-13 NOTE — Progress Notes (Signed)
Subjective:     [name] is a 57 y.o. female and is here for a comprehensive physical exam. The patient reports no problems.  Social History   Socioeconomic History  . Marital status: Married    Spouse name: Not on file  . Number of children: 0  . Years of education: Not on file  . Highest education level: Not on file  Occupational History  . Occupation: Retail buyer: Public house manager  Social Needs  . Financial resource strain: Not on file  . Food insecurity:    Worry: Not on file    Inability: Not on file  . Transportation needs:    Medical: Not on file    Non-medical: Not on file  Tobacco Use  . Smoking status: Never Smoker  . Smokeless tobacco: Never Used  Substance and Sexual Activity  . Alcohol use: No  . Drug use: No  . Sexual activity: Yes    Partners: Male  Lifestyle  . Physical activity:    Days per week: Not on file    Minutes per session: Not on file  . Stress: Not on file  Relationships  . Social connections:    Talks on phone: Not on file    Gets together: Not on file    Attends religious service: Not on file    Active member of club or organization: Not on file    Attends meetings of clubs or organizations: Not on file    Relationship status: Not on file  . Intimate partner violence:    Fear of current or ex partner: Not on file    Emotionally abused: Not on file    Physically abused: Not on file    Forced sexual activity: Not on file  Other Topics Concern  . Not on file  Social History Narrative   Regular exercise- joined [employer]--- plans to go 5 days a week   Health Maintenance  Topic Date Due  . MAMMOGRAM  02/08/2017  . INFLUENZA VACCINE  11/17/2017  . TETANUS/TDAP  04/30/2018  . COLONOSCOPY  09/26/2018  . PAP SMEAR  02/09/2019  . Hepatitis C Screening  Completed  . HIV Screening  Completed    The following portions of the patient's history were reviewed and updated as appropriate:  She  has a past medical history of  Abdominal pain, other specified site, Acute upper respiratory infections of unspecified site, Benign neoplasm of skin, site unspecified, Disturbance of skin sensation, Diverticulosis of colon (without mention of hemorrhage), Family history of diabetes mellitus, Family history of ischemic heart disease, Family history of malignant neoplasm of genital organ, other, GERD (gastroesophageal reflux disease), Hypertension, Hypokalemia, Left shoulder pain, Post-menopausal, Sprain and strain of unspecified site of knee and leg, Sprain of neck, and Unspecified essential hypertension. She does not have any pertinent problems on file. She  has a past surgical history that includes Total abdominal hysterectomy w/ bilateral salpingoophorectomy (2002); Bunionectomy (2005); Shoulder surgery; and Abdominal hysterectomy. Her family history includes Breast cancer in her cousin and maternal aunt; Dementia in her mother; Diabetes in her brother and mother; Hypertension in her father, mother, and sister; Prostate cancer in her father; Stroke in her maternal aunt and mother. She  reports that she has never smoked. She has never used smokeless tobacco. She reports that she does not drink alcohol or use drugs. She has a current medication list which includes the following prescription(s): amlodipine, cyanocobalamin, estradiol, fluticasone, hydroxychloroquine, ibuprofen, losartan-hydrochlorothiazide, naftifine, omeprazole,  potassium chloride, restasis, and hydroxychloroquine. Current Outpatient Medications on File Prior to Visit  Medication Sig Dispense Refill  . amLODipine (NORVASC) 10 MG tablet Take 1 tablet (10 mg total) by mouth daily. 90 tablet 3  . Cyanocobalamin (VITAMIN B-12 PO) Take 1 tablet by mouth daily.    Marland Kitchen estradiol (ESTRACE) 2 MG tablet Take 1 mg by mouth as needed.     . fluticasone (FLONASE) 50 MCG/ACT nasal spray INSTILL TWO SPRAYS INTO BOTH NOSTRIL DAILY. 16 g 2  . hydroxychloroquine (PLAQUENIL) 200 MG tablet  Take 200 mg by mouth 2 (two) times daily.      Marland Kitchen ibuprofen (ADVIL,MOTRIN) 800 MG tablet Take 1 tablet (800 mg total) by mouth every 8 (eight) hours as needed. 30 tablet 0  . losartan-hydrochlorothiazide (HYZAAR) 100-25 MG tablet Take 1 tablet by mouth daily. 90 tablet 3  . naftifine (NAFTIN) 1 % cream Apply topically daily. 60 g 0  . omeprazole (PRILOSEC) 20 MG capsule Take 1 capsule (20 mg total) by mouth daily. 90 capsule 3  . potassium chloride (K-DUR,KLOR-CON) 10 MEQ tablet TAKE TWO TABLETS BY MOUTH TWICE A DAY 120 tablet 0  . RESTASIS 0.05 % ophthalmic emulsion Place 1 drop into both eyes daily.      No current facility-administered medications on file prior to visit.    She has No Known Allergies..  Review of Systems Review of Systems  Constitutional: Negative for activity change, appetite change and fatigue.  HENT: Negative for hearing loss, congestion, tinnitus and ear discharge.  dentist q14m Eyes: Negative for visual disturbance (see optho q1y -- vision corrected to 20/20 with glasses).  Respiratory: Negative for cough, chest tightness and shortness of breath.   Cardiovascular: Negative for chest pain, palpitations and leg swelling.  Gastrointestinal: Negative for abdominal pain, diarrhea, constipation and abdominal distention.  Genitourinary: Negative for urgency, frequency, decreased urine volume and difficulty urinating.  Musculoskeletal: Negative for back pain, arthralgias and gait problem.  Skin: Negative for color change, pallor and rash.  Neurological: Negative for dizziness, light-headedness, numbness and headaches.  Hematological: Negative for adenopathy. Does not bruise/bleed easily.  Psychiatric/Behavioral: Negative for suicidal ideas, confusion, sleep disturbance, self-injury, dysphoric mood, decreased concentration and agitation.       Objective:    BP 128/76 (BP Location: Right Wrist, Cuff Size: Normal)   Pulse 75   Temp 98.4 F (36.9 C) (Oral)   Resp 16    Ht 5\' 8"  (1.727 m)   Wt 300 lb (136.1 kg)   SpO2 98%   BMI 45.61 kg/m  General appearance: alert, cooperative, appears stated age and no distress Head: Normocephalic, without obvious abnormality, atraumatic Eyes: negative findings: lids and lashes normal, conjunctivae and sclerae normal and pupils equal, round, reactive to light and accomodation Ears: normal TM's and external ear canals both ears Nose: Nares normal. Septum midline. Mucosa normal. No drainage or sinus tenderness. Throat: lips, mucosa, and tongue normal; teeth and gums normal Neck: no adenopathy, no carotid bruit, no JVD, supple, symmetrical, trachea midline and thyroid not enlarged, symmetric, no tenderness/mass/nodules Back: symmetric, no curvature. ROM normal. No CVA tenderness. Lungs: clear to auscultation bilaterally Breasts: gyn Heart: regular rate and rhythm, S1, S2 normal, no murmur, click, rub or gallop Abdomen: soft, non-tender; bowel sounds normal; no masses,  no organomegaly Pelvic: deferred--gyn Extremities: extremities normal, atraumatic, no cyanosis or edema Pulses: 2+ and symmetric Skin: Skin color, texture, turgor normal. No rashes or lesions Lymph nodes: Cervical, supraclavicular, and axillary nodes normal. Neurologic: Alert and  oriented X 3, normal strength and tone. Normal symmetric reflexes. Normal coordination and gait    Assessment:    Healthy female exam.      Plan:    ghm utd Check labs See After Visit Summary for Counseling Recommendations    1. Preventative health care See above - CBC with Differential/Platelet; Future - Comprehensive metabolic panel; Future - Lipid panel; Future - TSH; Future - HIV antibody; Future  2. SLE (systemic lupus erythematosus related syndrome) ([place]) Per rheum  3. Morbid obesity with BMI of 40.0-44.9, adult (HCC)  - Amb Ref to Medical Weight Management  4. Encounter for screening for HIV  - HIV antibody; Future  5. Hyperlipidemia, unspecified  hyperlipidemia type Tolerating statin, encouraged heart healthy diet, avoid trans fats, minimize simple carbs and saturated fats. Increase exercise as tolerated  6. Essential hypertension Well controlled, no changes to meds. Encouraged heart healthy diet such as the DASH diet and exercise as tolerated.

## 2017-10-13 NOTE — Assessment & Plan Note (Signed)
Tolerating statin, encouraged heart healthy diet, avoid trans fats, minimize simple carbs and saturated fats. Increase exercise as tolerated 

## 2017-10-13 NOTE — Patient Instructions (Signed)
Preventive Care 40-64 Years, Female Preventive care refers to lifestyle choices and visits with your health care provider that can promote health and wellness. What does preventive care include?  A yearly physical exam. This is also called an annual well check.  Dental exams once or twice a year.  Routine eye exams. Ask your health care provider how often you should have your eyes checked.  Personal lifestyle choices, including: ? Daily care of your teeth and gums. ? Regular physical activity. ? Eating a healthy diet. ? Avoiding tobacco and drug use. ? Limiting alcohol use. ? Practicing safe sex. ? Taking low-dose aspirin daily starting at age 58. ? Taking vitamin and mineral supplements as recommended by your health care provider. What happens during an annual well check? The services and screenings done by your health care provider during your annual well check will depend on your age, overall health, lifestyle risk factors, and family history of disease. Counseling Your health care provider may ask you questions about your:  Alcohol use.  Tobacco use.  Drug use.  Emotional well-being.  Home and relationship well-being.  Sexual activity.  Eating habits.  Work and work Statistician.  Method of birth control.  Menstrual cycle.  Pregnancy history.  Screening You may have the following tests or measurements:  Height, weight, and BMI.  Blood pressure.  Lipid and cholesterol levels. These may be checked every 5 years, or more frequently if you are over 81 years old.  Skin check.  Lung cancer screening. You may have this screening every year starting at age 78 if you have a 30-pack-year history of smoking and currently smoke or have quit within the past 15 years.  Fecal occult blood test (FOBT) of the stool. You may have this test every year starting at age 65.  Flexible sigmoidoscopy or colonoscopy. You may have a sigmoidoscopy every 5 years or a colonoscopy  every 10 years starting at age 30.  Hepatitis C blood test.  Hepatitis B blood test.  Sexually transmitted disease (STD) testing.  Diabetes screening. This is done by checking your blood sugar (glucose) after you have not eaten for a while (fasting). You may have this done every 1-3 years.  Mammogram. This may be done every 1-2 years. Talk to your health care provider about when you should start having regular mammograms. This may depend on whether you have a family history of breast cancer.  BRCA-related cancer screening. This may be done if you have a family history of breast, ovarian, tubal, or peritoneal cancers.  Pelvic exam and Pap test. This may be done every 3 years starting at age 80. Starting at age 36, this may be done every 5 years if you have a Pap test in combination with an HPV test.  Bone density scan. This is done to screen for osteoporosis. You may have this scan if you are at high risk for osteoporosis.  Discuss your test results, treatment options, and if necessary, the need for more tests with your health care provider. Vaccines Your health care provider may recommend certain vaccines, such as:  Influenza vaccine. This is recommended every year.  Tetanus, diphtheria, and acellular pertussis (Tdap, Td) vaccine. You may need a Td booster every 10 years.  Varicella vaccine. You may need this if you have not been vaccinated.  Zoster vaccine. You may need this after age 5.  Measles, mumps, and rubella (MMR) vaccine. You may need at least one dose of MMR if you were born in  1957 or later. You may also need a second dose.  Pneumococcal 13-valent conjugate (PCV13) vaccine. You may need this if you have certain conditions and were not previously vaccinated.  Pneumococcal polysaccharide (PPSV23) vaccine. You may need one or two doses if you smoke cigarettes or if you have certain conditions.  Meningococcal vaccine. You may need this if you have certain  conditions.  Hepatitis A vaccine. You may need this if you have certain conditions or if you travel or work in places where you may be exposed to hepatitis A.  Hepatitis B vaccine. You may need this if you have certain conditions or if you travel or work in places where you may be exposed to hepatitis B.  Haemophilus influenzae type b (Hib) vaccine. You may need this if you have certain conditions.  Talk to your health care provider about which screenings and vaccines you need and how often you need them. This information is not intended to replace advice given to you by your health care provider. Make sure you discuss any questions you have with your health care provider. Document Released: 05/02/2015 Document Revised: 12/24/2015 Document Reviewed: 02/04/2015 Elsevier Interactive Patient Education  2018 [employer]  

## 2017-10-14 ENCOUNTER — Other Ambulatory Visit (INDEPENDENT_AMBULATORY_CARE_PROVIDER_SITE_OTHER): Payer: Managed Care, Other (non HMO)

## 2017-10-14 DIAGNOSIS — Z114 Encounter for screening for human immunodeficiency virus [HIV]: Secondary | ICD-10-CM

## 2017-10-14 DIAGNOSIS — Z0000 Encounter for general adult medical examination without abnormal findings: Secondary | ICD-10-CM | POA: Diagnosis not present

## 2017-10-14 LAB — COMPREHENSIVE METABOLIC PANEL
ALBUMIN: 3.5 g/dL (ref 3.5–5.2)
ALK PHOS: 53 U/L (ref 39–117)
ALT: 21 U/L (ref 0–35)
AST: 15 U/L (ref 0–37)
BILIRUBIN TOTAL: 0.7 mg/dL (ref 0.2–1.2)
BUN: 13 mg/dL (ref 6–23)
CO2: 27 mEq/L (ref 19–32)
CREATININE: 0.85 mg/dL (ref 0.40–1.20)
Calcium: 8.7 mg/dL (ref 8.4–10.5)
Chloride: 104 mEq/L (ref 96–112)
GFR: 88.73 mL/min (ref >60.00)
GLUCOSE: 85 mg/dL (ref 70–99)
Potassium: 3.8 mEq/L (ref 3.5–5.1)
SODIUM: 140 meq/L (ref 135–145)
TOTAL PROTEIN: 6.1 g/dL (ref 6.0–8.3)

## 2017-10-14 LAB — CBC WITH DIFFERENTIAL/PLATELET
Basophils Absolute: 0.1 10*3/uL (ref 0.0–0.1)
Basophils Relative: 1.2 % (ref 0.0–3.0)
Eosinophils Absolute: 0.2 10*3/uL (ref 0.0–0.7)
Eosinophils Relative: 3.5 % (ref 0.0–5.0)
HEMATOCRIT: 35.4 % — AB (ref 36.0–46.0)
HEMOGLOBIN: 11.9 g/dL — AB (ref 12.0–15.0)
Lymphocytes Relative: 36.5 % (ref 12.0–46.0)
Lymphs Abs: 1.8 10*3/uL (ref 0.7–4.0)
MCHC: 33.6 g/dL (ref 30.0–36.0)
MCV: 85.1 fl (ref 78.0–100.0)
Monocytes Absolute: 0.4 10*3/uL (ref 0.1–1.0)
Monocytes Relative: 8.1 % (ref 3.0–12.0)
Neutro Abs: 2.5 10*3/uL (ref 1.4–7.7)
Neutrophils Relative %: 50.7 % (ref 43.0–77.0)
Platelets: 285 10*3/uL (ref 150.0–400.0)
RBC: 4.16 Mil/uL (ref 3.87–5.11)
RDW: 13.4 % (ref 11.5–15.5)
WBC: 4.8 10*3/uL (ref 4.0–10.5)

## 2017-10-14 LAB — LIPID PANEL
CHOLESTEROL: 164 mg/dL (ref 0–200)
HDL: 60.9 mg/dL (ref >39.00)
LDL Cholesterol: 84 mg/dL (ref 0–99)
NONHDL: 103.56
Total CHOL/HDL Ratio: 3
Triglycerides: 96 mg/dL (ref 0.0–149.0)
VLDL: 19.2 mg/dL (ref 0.0–40.0)

## 2017-10-14 LAB — TSH: TSH: 5.61 u[IU]/mL — AB (ref 0.35–4.50)

## 2017-10-15 ENCOUNTER — Other Ambulatory Visit: Payer: Self-pay | Admitting: Family Medicine

## 2017-10-15 DIAGNOSIS — E039 Hypothyroidism, unspecified: Secondary | ICD-10-CM

## 2017-10-15 LAB — HIV ANTIBODY (ROUTINE TESTING W REFLEX): HIV: NONREACTIVE

## 2017-10-26 ENCOUNTER — Other Ambulatory Visit (INDEPENDENT_AMBULATORY_CARE_PROVIDER_SITE_OTHER): Payer: Managed Care, Other (non HMO)

## 2017-10-26 DIAGNOSIS — E039 Hypothyroidism, unspecified: Secondary | ICD-10-CM | POA: Diagnosis not present

## 2017-10-26 LAB — THYROID PANEL WITH TSH
Free Thyroxine Index: 3.1 (ref 1.4–3.8)
T3 Uptake: 26 % (ref 22–35)
T4 TOTAL: 11.9 ug/dL (ref 5.1–11.9)
TSH: 5.13 mIU/L — ABNORMAL HIGH (ref 0.40–4.50)

## 2017-11-01 ENCOUNTER — Encounter: Payer: Self-pay | Admitting: *Deleted

## 2017-11-01 ENCOUNTER — Other Ambulatory Visit: Payer: Self-pay

## 2017-11-01 DIAGNOSIS — E039 Hypothyroidism, unspecified: Secondary | ICD-10-CM

## 2017-11-02 ENCOUNTER — Telehealth: Payer: Self-pay | Admitting: Family Medicine

## 2017-11-02 NOTE — Telephone Encounter (Signed)
Pt given lab results from 7/10. Pt states she has been been tired and has been experiencing a little more weight gain and is retaining fluid. Unable to document this information in the result note. Pt would like for the prescription on Synthroid to be sent to [facility] on [place].

## 2017-11-04 ENCOUNTER — Other Ambulatory Visit: Payer: Self-pay

## 2017-11-04 MED ORDER — LEVOTHYROXINE SODIUM 50 MCG PO TABS: 50.0000 ug | ORAL_TABLET | Freq: Every day | ORAL | 2 refills | Status: DC

## 2017-11-07 ENCOUNTER — Other Ambulatory Visit: Payer: Self-pay | Admitting: Family Medicine

## 2017-11-07 DIAGNOSIS — E876 Hypokalemia: Secondary | ICD-10-CM

## 2017-11-07 NOTE — Telephone Encounter (Signed)
Rx sent to Kristopher Oppenheim at [facility].

## 2017-12-23 ENCOUNTER — Other Ambulatory Visit (INDEPENDENT_AMBULATORY_CARE_PROVIDER_SITE_OTHER): Payer: Managed Care, Other (non HMO)

## 2017-12-23 ENCOUNTER — Encounter: Payer: Self-pay | Admitting: Family Medicine

## 2017-12-23 DIAGNOSIS — E039 Hypothyroidism, unspecified: Secondary | ICD-10-CM

## 2017-12-23 LAB — TSH: TSH: 2.8 u[IU]/mL (ref 0.35–4.50)

## 2017-12-30 ENCOUNTER — Encounter: Payer: Self-pay | Admitting: *Deleted

## 2017-12-30 ENCOUNTER — Other Ambulatory Visit: Payer: Self-pay | Admitting: *Deleted

## 2017-12-30 DIAGNOSIS — E039 Hypothyroidism, unspecified: Secondary | ICD-10-CM

## 2018-01-30 ENCOUNTER — Other Ambulatory Visit: Payer: Self-pay | Admitting: Family Medicine

## 2018-04-02 ENCOUNTER — Other Ambulatory Visit: Payer: Self-pay | Admitting: Family Medicine

## 2018-05-13 ENCOUNTER — Other Ambulatory Visit: Payer: Self-pay | Admitting: Family Medicine

## 2018-05-13 DIAGNOSIS — E876 Hypokalemia: Secondary | ICD-10-CM

## 2018-05-24 ENCOUNTER — Other Ambulatory Visit: Payer: Self-pay | Admitting: Obstetrics & Gynecology

## 2018-05-24 DIAGNOSIS — R928 Other abnormal and inconclusive findings on diagnostic imaging of breast: Secondary | ICD-10-CM

## 2018-06-02 ENCOUNTER — Other Ambulatory Visit: Payer: Self-pay | Admitting: Family Medicine

## 2018-06-02 DIAGNOSIS — R1013 Epigastric pain: Secondary | ICD-10-CM

## 2018-06-07 ENCOUNTER — Ambulatory Visit
Admission: RE | Admit: 2018-06-07 | Discharge: 2018-06-07 | Disposition: A | Discharge status: Home / Self Care | Payer: Managed Care, Other (non HMO) | Source: Ambulatory Visit | Attending: Obstetrics & Gynecology | Admitting: Obstetrics & Gynecology

## 2018-06-07 DIAGNOSIS — R928 Other abnormal and inconclusive findings on diagnostic imaging of breast: Secondary | ICD-10-CM

## 2018-06-08 ENCOUNTER — Other Ambulatory Visit: Payer: Self-pay | Admitting: Family Medicine

## 2018-06-08 DIAGNOSIS — I10 Essential (primary) hypertension: Secondary | ICD-10-CM

## 2018-06-15 ENCOUNTER — Encounter: Payer: Self-pay | Admitting: Family Medicine

## 2018-06-15 ENCOUNTER — Ambulatory Visit (INDEPENDENT_AMBULATORY_CARE_PROVIDER_SITE_OTHER): Payer: Managed Care, Other (non HMO) | Admitting: Family Medicine

## 2018-06-15 VITALS — BP 132/72 | HR 76 | Resp 12 | Ht 68.0 in | Wt 297.0 lb

## 2018-06-15 DIAGNOSIS — M545 Low back pain, unspecified: Secondary | ICD-10-CM

## 2018-06-15 DIAGNOSIS — I10 Essential (primary) hypertension: Secondary | ICD-10-CM | POA: Diagnosis not present

## 2018-06-15 DIAGNOSIS — E039 Hypothyroidism, unspecified: Secondary | ICD-10-CM | POA: Insufficient documentation

## 2018-06-15 DIAGNOSIS — Z23 Encounter for immunization: Secondary | ICD-10-CM | POA: Diagnosis not present

## 2018-06-15 DIAGNOSIS — R21 Rash and other nonspecific skin eruption: Secondary | ICD-10-CM

## 2018-06-15 DIAGNOSIS — E785 Hyperlipidemia, unspecified: Secondary | ICD-10-CM

## 2018-06-15 LAB — COMPREHENSIVE METABOLIC PANEL
ALBUMIN: 3.9 g/dL (ref 3.5–5.2)
ALK PHOS: 60 U/L (ref 39–117)
ALT: 14 U/L (ref 0–35)
AST: 12 U/L (ref 0–37)
BUN: 22 mg/dL (ref 6–23)
CO2: 27 mEq/L (ref 19–32)
Calcium: 8.7 mg/dL (ref 8.4–10.5)
Chloride: 103 mEq/L (ref 96–112)
Creatinine, Ser: 1.01 mg/dL (ref 0.40–1.20)
GFR: 68.25 mL/min (ref >60.00)
GLUCOSE: 74 mg/dL (ref 70–99)
POTASSIUM: 3.6 meq/L (ref 3.5–5.1)
SODIUM: 140 meq/L (ref 135–145)
TOTAL PROTEIN: 6.7 g/dL (ref 6.0–8.3)
Total Bilirubin: 0.8 mg/dL (ref 0.2–1.2)

## 2018-06-15 LAB — CBC WITH DIFFERENTIAL/PLATELET
Basophils Absolute: 0 10*3/uL (ref 0.0–0.1)
Basophils Relative: 1 % (ref 0.0–3.0)
EOS PCT: 2.6 % (ref 0.0–5.0)
Eosinophils Absolute: 0.1 10*3/uL (ref 0.0–0.7)
HCT: 36.7 % (ref 36.0–46.0)
Hemoglobin: 12.3 g/dL (ref 12.0–15.0)
LYMPHS ABS: 1.3 10*3/uL (ref 0.7–4.0)
Lymphocytes Relative: 29 % (ref 12.0–46.0)
MCHC: 33.6 g/dL (ref 30.0–36.0)
MCV: 84.6 fl (ref 78.0–100.0)
Monocytes Absolute: 0.4 10*3/uL (ref 0.1–1.0)
Monocytes Relative: 7.8 % (ref 3.0–12.0)
NEUTROS PCT: 59.6 % (ref 43.0–77.0)
Neutro Abs: 2.8 10*3/uL (ref 1.4–7.7)
PLATELETS: 266 10*3/uL (ref 150.0–400.0)
RBC: 4.34 Mil/uL (ref 3.87–5.11)
RDW: 13.6 % (ref 11.5–15.5)
WBC: 4.6 10*3/uL (ref 4.0–10.5)

## 2018-06-15 LAB — LIPID PANEL
Cholesterol: 182 mg/dL (ref 0–200)
HDL: 69.3 mg/dL (ref >39.00)
LDL Cholesterol: 98 mg/dL (ref 0–99)
NONHDL: 112.52
Total CHOL/HDL Ratio: 3
Triglycerides: 74 mg/dL (ref 0.0–149.0)
VLDL: 14.8 mg/dL (ref 0.0–40.0)

## 2018-06-15 LAB — POCT URINALYSIS DIPSTICK
BILIRUBIN UA: NEGATIVE
Glucose, UA: NEGATIVE
Ketones, UA: NEGATIVE
Leukocytes, UA: NEGATIVE
Nitrite, UA: NEGATIVE
PH UA: 5.5 (ref 5.0–8.0)
Protein, UA: POSITIVE — AB
Spec Grav, UA: >=1.03 — AB (ref 1.010–1.025)
Urobilinogen, UA: 0.2 E.U./dL

## 2018-06-15 LAB — TSH: TSH: 2.61 u[IU]/mL (ref 0.35–4.50)

## 2018-06-15 MED ORDER — CLOTRIMAZOLE-BETAMETHASONE 1-0.05 % EX CREA: 1.0000 "application " | TOPICAL_CREAM | Freq: Two times a day (BID) | CUTANEOUS | 0 refills | Status: DC

## 2018-06-15 NOTE — Progress Notes (Signed)
Patient ID: [name], female    DOB: 17-Mar-1961  Age: 58 y.o. MRN: [id number]    Subjective:  Subjective  HPI DALLY OSHEL presents for rash on her low legs and foot.  It is very itchy.  She has been using naftin with little relief.  Rash x several weeks' She also needs labs done and f/u   Review of Systems  Constitutional: Negative for appetite change, diaphoresis, fatigue and unexpected weight change.  Eyes: Negative for pain, redness and visual disturbance.  Respiratory: Negative for cough, chest tightness, shortness of breath and wheezing.   Cardiovascular: Negative for chest pain, palpitations and leg swelling.  Endocrine: Negative for cold intolerance, heat intolerance, polydipsia, polyphagia and polyuria.  Genitourinary: Negative for difficulty urinating, dysuria and frequency.  Skin: Positive for color change and rash.  Neurological: Negative for dizziness, light-headedness, numbness and headaches.    History Past Medical History:  Diagnosis Date  . Abdominal pain, other specified site   . Acute upper respiratory infections of unspecified site   . Benign neoplasm of skin, site unspecified   . Disturbance of skin sensation   . Diverticulosis of colon (without mention of hemorrhage)   . Family history of diabetes mellitus   . Family history of ischemic heart disease   . Family history of malignant neoplasm of genital organ, other   . GERD (gastroesophageal reflux disease)   . Hypertension   . Hypokalemia   . Left shoulder pain   . Post-menopausal   . Sprain and strain of unspecified site of knee and leg   . Sprain of neck   . Unspecified essential hypertension     She has a past surgical history that includes Total abdominal hysterectomy w/ bilateral salpingoophorectomy (2002); Bunionectomy (2005); Shoulder surgery; and Abdominal hysterectomy.   Her family history includes Breast cancer in her cousin and maternal aunt; Dementia in her mother; Diabetes in her  brother and mother; Hypertension in her father, mother, and sister; Prostate cancer in her father; Stroke in her maternal aunt and mother.She reports that she has never smoked. She has never used smokeless tobacco. She reports that she does not drink alcohol or use drugs.  Current Outpatient Medications on File Prior to Visit  Medication Sig Dispense Refill  . amLODipine (NORVASC) 10 MG tablet TAKE ONE TABLET BY MOUTH DAILY 90 tablet 1  . Cyanocobalamin (VITAMIN B-12 PO) Take 1 tablet by mouth daily.    Marland Kitchen estradiol (ESTRACE) 2 MG tablet Take 1 mg by mouth as needed.     . fluticasone (FLONASE) 50 MCG/ACT nasal spray INSTILL TWO SPRAYS INTO BOTH NOSTRIL DAILY. 16 g 2  . hydroxychloroquine (PLAQUENIL) 200 MG tablet Take 200 mg by mouth 2 (two) times daily.      Marland Kitchen levothyroxine (SYNTHROID, LEVOTHROID) 50 MCG tablet TAKE ONE TABLET BY MOUTH EVERY MORNING BEFORE BREAKFAST 90 tablet 1  . losartan-hydrochlorothiazide (HYZAAR) 100-25 MG tablet Take 1 tablet by mouth daily. 90 tablet 3  . naftifine (NAFTIN) 1 % cream Apply topically daily. 60 g 0  . naproxen sodium (ALEVE) 220 MG tablet Take 220 mg by mouth.    Marland Kitchen omeprazole (PRILOSEC) 20 MG capsule TAKE ONE CAPSULE BY MOUTH DAILY 30 capsule 2  . potassium chloride (K-DUR,KLOR-CON) 10 MEQ tablet TAKE TWO TABLETS BY MOUTH TWICE A DAY 120 tablet 2  . RESTASIS 0.05 % ophthalmic emulsion Place 1 drop into both eyes daily.      No current facility-administered medications on file prior to  visit.      Objective:  Objective  Physical Exam Vitals signs and nursing note reviewed.  Constitutional:      Appearance: She is well-developed.  HENT:     Head: Normocephalic and atraumatic.  Eyes:     Conjunctiva/sclera: Conjunctivae normal.  Neck:     Musculoskeletal: Normal range of motion and neck supple.     Thyroid: No thyromegaly.     Vascular: No carotid bruit or JVD.  Cardiovascular:     Rate and Rhythm: Normal rate and regular rhythm.     Heart  sounds: Normal heart sounds. No murmur.  Pulmonary:     Effort: Pulmonary effort is normal. No respiratory distress.     Breath sounds: Normal breath sounds. No wheezing or rales.  Chest:     Chest wall: No tenderness.  Skin:    Findings: Rash present.  Neurological:     Mental Status: She is alert and oriented to person, place, and time.    BP 132/72 (BP Location: Left Arm, Patient Position: Sitting, Cuff Size: Large)   Pulse 76   Resp 12   Ht 5\' 8"  (1.727 m)   Wt 297 lb (134.7 kg)   SpO2 97%   BMI 45.16 kg/m  Wt Readings from Last 3 Encounters:  06/15/18 297 lb (134.7 kg)  10/13/17 300 lb (136.1 kg)  08/04/17 294 lb 6 oz (133.5 kg)         Lab Results  Component Value Date   WBC 4.6 06/15/2018   HGB 12.3 06/15/2018   HCT 36.7 06/15/2018   PLT 266.0 06/15/2018   GLUCOSE 74 06/15/2018   CHOL 182 06/15/2018   TRIG 74.0 06/15/2018   HDL 69.30 06/15/2018   LDLDIRECT 128.5 04/30/2008   LDLCALC 98 06/15/2018   ALT 14 06/15/2018   AST 12 06/15/2018   NA 140 06/15/2018   K 3.6 06/15/2018   CL 103 06/15/2018   CREATININE 1.01 06/15/2018   BUN 22 06/15/2018   CO2 27 06/15/2018   TSH 2.61 06/15/2018    US Breast Ltd Uni Left Inc Axilla  Result Date: 06/07/2018 CLINICAL DATA:  58 year old patient recalled recent screening mammogram for evaluation 2 circumscribed left breast masses. EXAM: ULTRASOUND OF THE LEFT BREAST COMPARISON:  May 23, 2018 and earlier priors FINDINGS: Targeted ultrasound is performed, showing a circumscribed oval mass with well-defined posterior wall and posterior acoustic enhancement at 12 o'clock position 6 cm from nipple measuring 1.6 x 0.6 x 1.3 cm. There are some internal echoes, and no internal vascular flow. Findings consistent with a mildly complicated benign cyst. Adjacent to this mass in the 12 o'clock position 4 cm from the nipple is a 0.9 x 0.9 x 0.7 cm circumscribed oval mass consistent with a simple cyst. There is no internal vascular  flow. These cysts correspond in size, shape, and location to the mammographically detected masses. No suspicious mass identified on ultrasound. IMPRESSION: Benign cysts in the 12 o'clock axis of the left breast. No evidence of malignancy. RECOMMENDATION: Screening mammogram in one year.(Code:[id number]) I have discussed the findings and recommendations with the patient. Results were also provided in writing at the conclusion of the visit. If applicable, a reminder letter will be sent to the patient regarding the next appointment. BI-RADS CATEGORY  2: Benign. Electronically Signed   By: Curlene Dolphin M.D.   On: 06/07/2018 14:57     Assessment & Plan:  Plan  I have discontinued Jillisa R. Vittorio's ibuprofen. I am also having  her start on clotrimazole-betamethasone. Additionally, I am having her maintain her estradiol, hydroxychloroquine, RESTASIS, Cyanocobalamin (VITAMIN B-12 PO), naftifine, losartan-hydrochlorothiazide, fluticasone, levothyroxine, potassium chloride, omeprazole, amLODipine, and naproxen sodium.  Meds ordered this encounter  Medications  . clotrimazole-betamethasone (LOTRISONE) cream    Sig: Apply 1 application topically 2 (two) times daily.    Dispense:  30 g    Refill:  0    Problem List Items Addressed This Visit      Unprioritized   Acute left-sided low back pain without sciatica    Comes and goes and relieved with tylenol or aleve rto prn       Relevant Medications   naproxen sodium (ALEVE) 220 MG tablet   Other Relevant Orders   POCT Urinalysis Dipstick (Completed)   Urine Culture   Essential hypertension    Well controlled, no changes to meds. Encouraged heart healthy diet such as the DASH diet and exercise as tolerated.       Relevant Orders   CBC with Differential/Platelet (Completed)   Lipid panel (Completed)   Comprehensive metabolic panel (Completed)   Hyperlipidemia    Tolerating statin, encouraged heart healthy diet, avoid trans fats, minimize simple carbs  and saturated fats. Increase exercise as tolerated      Hypothyroidism   Relevant Orders   TSH (Completed)   Rash - Primary    Tinea pedis--- with some inflammation--- change naftin to lotrisome If no improvement --- refer to derm      Relevant Medications   clotrimazole-betamethasone (LOTRISONE) cream   Other Relevant Orders   CBC with Differential/Platelet (Completed)    Other Visit Diagnoses    Need for zoster vaccination       Relevant Orders   Varicella-zoster vaccine IM (Shingrix) (Completed)      Follow-up: Return in about 6 months (around 12/14/2018), or if symptoms worsen or fail to improve, for annual exam, fasting.  [name], DO

## 2018-06-15 NOTE — Assessment & Plan Note (Signed)
Comes and goes and relieved with tylenol or aleve rto prn

## 2018-06-15 NOTE — Assessment & Plan Note (Signed)
Well controlled, no changes to meds. Encouraged heart healthy diet such as the DASH diet and exercise as tolerated.  °

## 2018-06-15 NOTE — Assessment & Plan Note (Signed)
Tolerating statin, encouraged heart healthy diet, avoid trans fats, minimize simple carbs and saturated fats. Increase exercise as tolerated 

## 2018-06-15 NOTE — Assessment & Plan Note (Signed)
Tinea pedis--- with some inflammation--- change naftin to lotrisome If no improvement --- refer to derm

## 2018-06-15 NOTE — Patient Instructions (Signed)
Athlete's Foot    Athlete's foot (tinea pedis) is a fungal infection of the skin on your feet. It often occurs on the skin that is between or underneath your toes. It can also occur on the soles of your feet. Symptoms include itchy or white and flaky areas on the skin. The infection can spread from person to person (is contagious). It can also spread when a person's bare feet come in contact with the fungus on shower floors or on items such as shoes.  Follow these instructions at home:  Medicines  · Apply or take over-the-counter and prescription medicines only as told by your doctor.  · Apply your antifungal medicine as told by your doctor. Do not stop using the medicine even if your feet start to get better.  Foot care  · Do not scratch your feet.  · Keep your feet dry:  ? Wear cotton or wool socks. Change your socks every day or if they become wet.  ? Wear shoes that allow air to move around, such as sandals or canvas tennis shoes.  · Wash and dry your feet:  ? Every day or as told by your doctor.  ? After exercising.  ? Including the area between your toes.  General instructions  · Do not share any of these items that touch your feet:  ? Towels.  ? Shoes.  ? Nail clippers.  ? Other personal items.  · Protect your feet by wearing sandals in wet areas, such as locker rooms and shared showers.  · Keep all follow-up visits as told by your doctor. This is important.  · If you have diabetes, keep your blood sugar under control.  Contact a doctor if:  · You have a fever.  · You have swelling, pain, warmth, or redness in your foot.  · Your feet are not getting better with treatment.  · Your symptoms get worse.  · You have new symptoms.  Summary  · Athlete's foot is a fungal infection of the skin on your feet.  · Symptoms include itchy or white and flaky areas on the skin.  · Apply your antifungal medicine as told by your doctor.  · Keep your feet clean and dry.  This information is not intended to replace advice given  to you by your health care provider. Make sure you discuss any questions you have with your health care provider.  Document Released: 09/22/2007 Document Revised: 01/24/2017 Document Reviewed: 01/24/2017  Elsevier Interactive Patient Education © 2019 [employer]

## 2018-06-16 LAB — URINE CULTURE
MICRO NUMBER:: 250854
SPECIMEN QUALITY:: ADEQUATE

## 2018-06-17 ENCOUNTER — Other Ambulatory Visit: Payer: Self-pay | Admitting: Family Medicine

## 2018-06-17 DIAGNOSIS — I10 Essential (primary) hypertension: Secondary | ICD-10-CM

## 2018-07-27 ENCOUNTER — Other Ambulatory Visit: Payer: Self-pay | Admitting: Family Medicine

## 2018-07-27 ENCOUNTER — Telehealth: Payer: Self-pay | Admitting: Family Medicine

## 2018-07-27 DIAGNOSIS — J302 Other seasonal allergic rhinitis: Secondary | ICD-10-CM

## 2018-07-27 DIAGNOSIS — B354 Tinea corporis: Secondary | ICD-10-CM

## 2018-07-27 DIAGNOSIS — R21 Rash and other nonspecific skin eruption: Secondary | ICD-10-CM

## 2018-07-27 MED ORDER — FLUTICASONE PROPIONATE 50 MCG/ACT NA SUSP: NASAL | 2 refills | Status: DC

## 2018-07-27 NOTE — Telephone Encounter (Signed)
Pt left vm requesting refill of her cream but did not leave name of medication. Could someone verify with pt.

## 2018-07-31 ENCOUNTER — Other Ambulatory Visit: Payer: Self-pay | Admitting: Family Medicine

## 2018-07-31 DIAGNOSIS — R21 Rash and other nonspecific skin eruption: Secondary | ICD-10-CM

## 2018-07-31 MED ORDER — CLOTRIMAZOLE-BETAMETHASONE 1-0.05 % EX CREA: 1.0000 "application " | TOPICAL_CREAM | Freq: Two times a day (BID) | CUTANEOUS | 0 refills | Status: DC

## 2018-07-31 NOTE — Telephone Encounter (Signed)
Ok to refill 

## 2018-07-31 NOTE — Telephone Encounter (Signed)
Are we ok to fill Lotrisone cream.  It looks like last note we will refer to derm if no better.  I will send referral but I wanted to know if it was ok to refill cream.

## 2018-07-31 NOTE — Telephone Encounter (Signed)
Patient notified that medication is sent in and referral placed

## 2018-08-19 ENCOUNTER — Other Ambulatory Visit: Payer: Self-pay | Admitting: Family Medicine

## 2018-08-19 DIAGNOSIS — E876 Hypokalemia: Secondary | ICD-10-CM

## 2018-08-31 ENCOUNTER — Other Ambulatory Visit: Payer: Self-pay | Admitting: Family Medicine

## 2018-08-31 DIAGNOSIS — R1013 Epigastric pain: Secondary | ICD-10-CM

## 2018-09-12 ENCOUNTER — Encounter: Payer: Self-pay | Admitting: Gastroenterology

## 2018-10-01 ENCOUNTER — Other Ambulatory Visit: Payer: Self-pay | Admitting: Family Medicine

## 2018-10-19 ENCOUNTER — Other Ambulatory Visit: Payer: Self-pay

## 2018-10-19 ENCOUNTER — Encounter: Payer: Self-pay | Admitting: Family Medicine

## 2018-10-19 ENCOUNTER — Ambulatory Visit (INDEPENDENT_AMBULATORY_CARE_PROVIDER_SITE_OTHER): Payer: Managed Care, Other (non HMO) | Admitting: Family Medicine

## 2018-10-19 ENCOUNTER — Other Ambulatory Visit: Payer: Self-pay | Admitting: Family Medicine

## 2018-10-19 VITALS — BP 125/60 | HR 72 | Temp 98.4°F | Resp 18 | Ht 68.0 in | Wt 302.0 lb

## 2018-10-19 DIAGNOSIS — B354 Tinea corporis: Secondary | ICD-10-CM | POA: Diagnosis not present

## 2018-10-19 DIAGNOSIS — Z0000 Encounter for general adult medical examination without abnormal findings: Secondary | ICD-10-CM

## 2018-10-19 DIAGNOSIS — Z23 Encounter for immunization: Secondary | ICD-10-CM

## 2018-10-19 DIAGNOSIS — E039 Hypothyroidism, unspecified: Secondary | ICD-10-CM

## 2018-10-19 DIAGNOSIS — I10 Essential (primary) hypertension: Secondary | ICD-10-CM

## 2018-10-19 DIAGNOSIS — E876 Hypokalemia: Secondary | ICD-10-CM

## 2018-10-19 LAB — CBC WITH DIFFERENTIAL/PLATELET
Basophils Absolute: 0.1 10*3/uL (ref 0.0–0.1)
Basophils Relative: 1.3 % (ref 0.0–3.0)
Eosinophils Absolute: 0.1 10*3/uL (ref 0.0–0.7)
Eosinophils Relative: 3 % (ref 0.0–5.0)
HCT: 36.7 % (ref 36.0–46.0)
Hemoglobin: 11.9 g/dL — ABNORMAL LOW (ref 12.0–15.0)
Lymphocytes Relative: 32.2 % (ref 12.0–46.0)
Lymphs Abs: 1.5 10*3/uL (ref 0.7–4.0)
MCHC: 32.5 g/dL (ref 30.0–36.0)
MCV: 86.2 fl (ref 78.0–100.0)
Monocytes Absolute: 0.4 10*3/uL (ref 0.1–1.0)
Monocytes Relative: 8.5 % (ref 3.0–12.0)
Neutro Abs: 2.6 10*3/uL (ref 1.4–7.7)
Neutrophils Relative %: 55 % (ref 43.0–77.0)
Platelets: 304 10*3/uL (ref 150.0–400.0)
RBC: 4.26 Mil/uL (ref 3.87–5.11)
RDW: 13.8 % (ref 11.5–15.5)
WBC: 4.7 10*3/uL (ref 4.0–10.5)

## 2018-10-19 LAB — COMPREHENSIVE METABOLIC PANEL
ALT: 14 U/L (ref 0–35)
AST: 12 U/L (ref 0–37)
Albumin: 3.8 g/dL (ref 3.5–5.2)
Alkaline Phosphatase: 58 U/L (ref 39–117)
BUN: 19 mg/dL (ref 6–23)
CO2: 28 mEq/L (ref 19–32)
Calcium: 8.9 mg/dL (ref 8.4–10.5)
Chloride: 105 mEq/L (ref 96–112)
Creatinine, Ser: 0.94 mg/dL (ref 0.40–1.20)
GFR: 74.06 mL/min (ref >60.00)
Glucose, Bld: 80 mg/dL (ref 70–99)
Potassium: 3.9 mEq/L (ref 3.5–5.1)
Sodium: 141 mEq/L (ref 135–145)
Total Bilirubin: 0.7 mg/dL (ref 0.2–1.2)
Total Protein: 6.7 g/dL (ref 6.0–8.3)

## 2018-10-19 LAB — LIPID PANEL
Cholesterol: 169 mg/dL (ref 0–200)
HDL: 64.4 mg/dL (ref >39.00)
LDL Cholesterol: 91 mg/dL (ref 0–99)
NonHDL: 104.86
Total CHOL/HDL Ratio: 3
Triglycerides: 68 mg/dL (ref 0.0–149.0)
VLDL: 13.6 mg/dL (ref 0.0–40.0)

## 2018-10-19 LAB — TSH: TSH: 3 u[IU]/mL (ref 0.35–4.50)

## 2018-10-19 MED ORDER — NAFTIFINE HCL 1 % EX CREA: TOPICAL_CREAM | Freq: Every day | CUTANEOUS | 2 refills | Status: DC

## 2018-10-19 NOTE — Patient Instructions (Signed)

## 2018-10-19 NOTE — Progress Notes (Signed)
Subjective:     [name] is a 58 y.o. female and is here for a comprehensive physical exam. The patient reports no problems.  Social History   Socioeconomic History  . Marital status: Married    Spouse name: Not on file  . Number of children: 0  . Years of education: Not on file  . Highest education level: Not on file  Occupational History  . Occupation: Retail buyer: Public house manager  Social Needs  . Financial resource strain: Not on file  . Food insecurity    Worry: Not on file    Inability: Not on file  . Transportation needs    Medical: Not on file    Non-medical: Not on file  Tobacco Use  . Smoking status: Never Smoker  . Smokeless tobacco: Never Used  Substance and Sexual Activity  . Alcohol use: No  . Drug use: No  . Sexual activity: Yes    Partners: Male  Lifestyle  . Physical activity    Days per week: Not on file    Minutes per session: Not on file  . Stress: Not on file  Relationships  . Social Herbalist on phone: Not on file    Gets together: Not on file    Attends religious service: Not on file    Active member of club or organization: Not on file    Attends meetings of clubs or organizations: Not on file    Relationship status: Not on file  . Intimate partner violence    Fear of current or ex partner: Not on file    Emotionally abused: Not on file    Physically abused: Not on file    Forced sexual activity: Not on file  Other Topics Concern  . Not on file  Social History Narrative   Regular exercise- joined [employer]--- plans to go 5 days a week   Health Maintenance  Topic Date Due  . COLONOSCOPY  09/26/2018  . INFLUENZA VACCINE  11/18/2018  . PAP SMEAR-Modifier  02/09/2019  . MAMMOGRAM  06/19/2019  . TETANUS/TDAP  01/13/2028  . Hepatitis C Screening  Completed  . HIV Screening  Completed    The following portions of the patient's history were reviewed and updated as appropriate: She  has a past medical history  of Abdominal pain, other specified site, Acute upper respiratory infections of unspecified site, Benign neoplasm of skin, site unspecified, Disturbance of skin sensation, Diverticulosis of colon (without mention of hemorrhage), Family history of diabetes mellitus, Family history of ischemic heart disease, Family history of malignant neoplasm of genital organ, other, GERD (gastroesophageal reflux disease), Hypertension, Hypokalemia, Left shoulder pain, Post-menopausal, Sprain and strain of unspecified site of knee and leg, Sprain of neck, and Unspecified essential hypertension. She does not have any pertinent problems on file. She  has a past surgical history that includes Total abdominal hysterectomy w/ bilateral salpingoophorectomy (2002); Bunionectomy (2005); Shoulder surgery; and Abdominal hysterectomy. Her family history includes Breast cancer in her cousin and maternal aunt; Dementia in her mother; Diabetes in her brother and mother; Hypertension in her father, mother, and sister; Prostate cancer in her father; Stroke in her maternal aunt and mother. She  reports that she has never smoked. She has never used smokeless tobacco. She reports that she does not drink alcohol or use drugs. She has a current medication list which includes the following prescription(s): amlodipine, clotrimazole-betamethasone, cyanocobalamin, estradiol, fluticasone, hydroxychloroquine, levothyroxine, losartan-hydrochlorothiazide, naftifine, naproxen  sodium, omeprazole, potassium chloride, and restasis. Current Outpatient Medications on File Prior to Visit  Medication Sig Dispense Refill  . amLODipine (NORVASC) 10 MG tablet TAKE ONE TABLET BY MOUTH DAILY 90 tablet 1  . clotrimazole-betamethasone (LOTRISONE) cream Apply 1 application topically 2 (two) times daily. 30 g 0  . Cyanocobalamin (VITAMIN B-12 PO) Take 1 tablet by mouth daily.    Marland Kitchen estradiol (ESTRACE) 2 MG tablet Take 1 mg by mouth as needed.     . fluticasone  (FLONASE) 50 MCG/ACT nasal spray INSTILL TWO SPRAYS INTO BOTH NOSTRIL DAILY. 16 g 2  . hydroxychloroquine (PLAQUENIL) 200 MG tablet Take 200 mg by mouth 2 (two) times daily.      Marland Kitchen levothyroxine (SYNTHROID) 50 MCG tablet TAKE ONE TABLET BY MOUTH EVERY MORNING BEFORE BREAKFAST 90 tablet 0  . losartan-hydrochlorothiazide (HYZAAR) 100-25 MG tablet TAKE ONE TABLET BY MOUTH DAILY 90 tablet 3  . naproxen sodium (ALEVE) 220 MG tablet Take 220 mg by mouth.    Marland Kitchen omeprazole (PRILOSEC) 20 MG capsule TAKE ONE CAPSULE BY MOUTH DAILY 90 capsule 1  . potassium chloride (K-DUR) 10 MEQ tablet TAKE 2 TABLETS BY MOUTH TWO TIMES A DAY 120 tablet 1  . RESTASIS 0.05 % ophthalmic emulsion Place 1 drop into both eyes daily.      No current facility-administered medications on file prior to visit.    She has No Known Allergies..  Review of Systems Review of Systems  Constitutional: Negative for activity change, appetite change and fatigue.  HENT: Negative for hearing loss, congestion, tinnitus and ear discharge.  dentist q31m Eyes: Negative for visual disturbance (see optho q1y -- vision corrected to 20/20 with glasses).  Respiratory: Negative for cough, chest tightness and shortness of breath.   Cardiovascular: Negative for chest pain, palpitations and leg swelling.  Gastrointestinal: Negative for abdominal pain, diarrhea, constipation and abdominal distention.  Genitourinary: Negative for urgency, frequency, decreased urine volume and difficulty urinating.  Musculoskeletal: Negative for back pain, and gait problem. + knee pain -- sees ortho Skin: Negative for color change, pallor and rash.  Neurological: Negative for dizziness, light-headedness, numbness and headaches.  Hematological: Negative for adenopathy. Does not bruise/bleed easily.  Psychiatric/Behavioral: Negative for suicidal ideas, confusion, sleep disturbance, self-injury, dysphoric mood, decreased concentration and agitation.      Objective:     BP 125/60 (BP Location: Right Arm, Patient Position: Sitting, Cuff Size: Large)   Pulse 72   Temp 98.4 F (36.9 C) (Oral)   Resp 18   Ht 5\' 8"  (1.727 m)   Wt (!) 302 lb (137 kg)   SpO2 100%   BMI 45.92 kg/m  General appearance: alert, cooperative, appears stated age and no distress Head: Normocephalic, without obvious abnormality, atraumatic Eyes: conjunctivae/corneas clear. PERRL, EOM's intact. Fundi benign. Ears: normal TM's and external ear canals both ears Nose: Nares normal. Septum midline. Mucosa normal. No drainage or sinus tenderness. Throat: lips, mucosa, and tongue normal; teeth and gums normal Neck: no adenopathy, no carotid bruit, no JVD, supple, symmetrical, trachea midline and thyroid not enlarged, symmetric, no tenderness/mass/nodules Back: symmetric, no curvature. ROM normal. No CVA tenderness. Lungs: clear to auscultation bilaterally Breasts: gyn Heart: regular rate and rhythm, S1, S2 normal, no murmur, click, rub or gallop Abdomen: soft, non-tender; bowel sounds normal; no masses,  no organomegaly Pelvic: deferred-gyn Extremities: extremities normal, atraumatic, no cyanosis or edema Pulses: 2+ and symmetric Skin: Skin color, texture, turgor normal. No rashes or lesions Lymph nodes: Cervical, supraclavicular, and axillary nodes  normal. Neurologic: Alert and oriented X 3, normal strength and tone. Normal symmetric reflexes. Normal coordination and gait    Assessment:    Healthy female exam.      Plan:    ghm utd Check labs  shingrix #2 given See After Visit Summary for Counseling Recommendations    1. Preventative health care Check labs  See above  - Ambulatory referral to Gastroenterology - TSH - Lipid panel - CBC with Differential/Platelet - Comprehensive metabolic panel - Varicella-zoster vaccine IM (Shingrix)  2. Hypothyroidism, unspecified type Stable Check labs  - TSH - Lipid panel - CBC with Differential/Platelet - Comprehensive metabolic  panel  3. Essential hypertension Well controlled, no changes to meds. Encouraged heart healthy diet such as the DASH diet and exercise as tolerated.  - TSH - Lipid panel - CBC with Differential/Platelet - Comprehensive metabolic panel  4. Tinea corporis Refill meds  - naftifine (NAFTIN) 1 % cream; Apply topically daily.  Dispense: 60 g; Refill: 2  5. Morbid obesity ([place])   - Amb Ref to Medical Weight Management

## 2018-10-30 ENCOUNTER — Other Ambulatory Visit: Payer: Self-pay | Admitting: Family Medicine

## 2018-10-30 DIAGNOSIS — E876 Hypokalemia: Secondary | ICD-10-CM

## 2018-12-08 ENCOUNTER — Other Ambulatory Visit: Payer: Self-pay

## 2018-12-11 ENCOUNTER — Encounter: Payer: Self-pay | Admitting: Family Medicine

## 2018-12-11 ENCOUNTER — Ambulatory Visit (INDEPENDENT_AMBULATORY_CARE_PROVIDER_SITE_OTHER): Payer: Managed Care, Other (non HMO) | Admitting: Family Medicine

## 2018-12-11 ENCOUNTER — Other Ambulatory Visit: Payer: Self-pay

## 2018-12-11 VITALS — BP 132/84 | HR 87 | Temp 98.0°F | Resp 18 | Ht 68.0 in | Wt 300.4 lb

## 2018-12-11 DIAGNOSIS — S161XXA Strain of muscle, fascia and tendon at neck level, initial encounter: Secondary | ICD-10-CM | POA: Diagnosis not present

## 2018-12-11 MED ORDER — CYCLOBENZAPRINE HCL 10 MG PO TABS: 10.0000 mg | ORAL_TABLET | Freq: Three times a day (TID) | ORAL | 0 refills | Status: DC | PRN

## 2018-12-11 MED ORDER — MELOXICAM 7.5 MG PO TABS: ORAL_TABLET | ORAL | 2 refills | Status: DC

## 2018-12-11 NOTE — Patient Instructions (Signed)

## 2018-12-11 NOTE — Progress Notes (Signed)
Patient ID: [name], female    DOB: [birth year]  Age: 57 y.o. MRN: [id number]    Subjective:  Subjective  HPI [name] presents with c/o neck pain x several days.  No known injury.  Pt sits at a computer all day and she may have slept on it funny Pain is b/l but worse on the R side.  It starts at the base of the scalp and goes to shoulder and shoulder blade on that side   Review of Systems  Constitutional: Negative for appetite change, diaphoresis, fatigue and unexpected weight change.  Eyes: Negative for pain, redness and visual disturbance.  Respiratory: Negative for cough, chest tightness, shortness of breath and wheezing.   Cardiovascular: Negative for chest pain, palpitations and leg swelling.  Endocrine: Negative for cold intolerance, heat intolerance, polydipsia, polyphagia and polyuria.  Genitourinary: Negative for difficulty urinating, dysuria and frequency.  Musculoskeletal: Positive for arthralgias, neck pain and neck stiffness.  Neurological: Negative for dizziness, light-headedness, numbness and headaches.    History Past Medical History:  Diagnosis Date  . Abdominal pain, other specified site   . Acute upper respiratory infections of unspecified site   . Benign neoplasm of skin, site unspecified   . Disturbance of skin sensation   . Diverticulosis of colon (without mention of hemorrhage)   . Family history of diabetes mellitus   . Family history of ischemic heart disease   . Family history of malignant neoplasm of genital organ, other   . GERD (gastroesophageal reflux disease)   . Hypertension   . Hypokalemia   . Left shoulder pain   . Post-menopausal   . Sprain and strain of unspecified site of knee and leg   . Sprain of neck   . Unspecified essential hypertension     She has a past surgical history that includes Total abdominal hysterectomy w/ bilateral salpingoophorectomy (2002); Bunionectomy (2005); Shoulder surgery; and Abdominal hysterectomy.    Her family history includes Breast cancer in her cousin and maternal aunt; Dementia in her mother; Diabetes in her brother and mother; Hypertension in her father, mother, and sister; Prostate cancer in her father; Stroke in her maternal aunt and mother.She reports that she has never smoked. She has never used smokeless tobacco. She reports that she does not drink alcohol or use drugs.  Current Outpatient Medications on File Prior to Visit  Medication Sig Dispense Refill  . amLODipine (NORVASC) 10 MG tablet TAKE ONE TABLET BY MOUTH DAILY 90 tablet 1  . clotrimazole-betamethasone (LOTRISONE) cream Apply 1 application topically 2 (two) times daily. 30 g 0  . Cyanocobalamin (VITAMIN B-12 PO) Take 1 tablet by mouth daily.    Marland Kitchen estradiol (ESTRACE) 2 MG tablet Take 1 mg by mouth as needed.     . fluticasone (FLONASE) 50 MCG/ACT nasal spray INSTILL TWO SPRAYS INTO BOTH NOSTRIL DAILY. 16 g 2  . hydroxychloroquine (PLAQUENIL) 200 MG tablet Take 200 mg by mouth 2 (two) times daily.      Marland Kitchen levothyroxine (SYNTHROID) 50 MCG tablet TAKE ONE TABLET BY MOUTH EVERY MORNING BEFORE BREAKFAST 90 tablet 0  . losartan-hydrochlorothiazide (HYZAAR) 100-25 MG tablet TAKE ONE TABLET BY MOUTH DAILY 90 tablet 3  . naftifine (NAFTIN) 1 % cream Apply topically daily. 60 g 2  . naproxen sodium (ALEVE) 220 MG tablet Take 220 mg by mouth.    Marland Kitchen omeprazole (PRILOSEC) 20 MG capsule TAKE ONE CAPSULE BY MOUTH DAILY 90 capsule 1  . potassium chloride (K-DUR) 10 MEQ tablet  TAKE TWO TABLETS BY MOUTH TWICE A DAY 120 tablet 0  . RESTASIS 0.05 % ophthalmic emulsion Place 1 drop into both eyes daily.      No current facility-administered medications on file prior to visit.      Objective:  Objective  Physical Exam Nursing note reviewed.  Constitutional:      Appearance: She is well-developed.  HENT:     Head: Normocephalic and atraumatic.  Eyes:     Conjunctiva/sclera: Conjunctivae normal.  Neck:     Musculoskeletal: Normal  range of motion and neck supple.     Thyroid: No thyromegaly.     Vascular: No carotid bruit or JVD.  Cardiovascular:     Rate and Rhythm: Normal rate and regular rhythm.     Heart sounds: Normal heart sounds. No murmur.  Pulmonary:     Effort: Pulmonary effort is normal. No respiratory distress.     Breath sounds: Normal breath sounds. No wheezing or rales.  Chest:     Chest wall: No tenderness.  Musculoskeletal:        General: Tenderness present.     Cervical back: She exhibits spasm. She exhibits normal range of motion.  Neurological:     Mental Status: She is alert and oriented to person, place, and time.    BP 132/84 (BP Location: Right Arm, Patient Position: Sitting, Cuff Size: Normal)   Pulse 87   Temp 98 F (36.7 C) (Temporal)   Resp 18   Ht 5\' 8"  (1.727 m)   Wt (!) 300 lb 6.4 oz (136.3 kg)   SpO2 97%   BMI 45.68 kg/m  Wt Readings from Last 3 Encounters:  12/11/18 (!) 300 lb 6.4 oz (136.3 kg)  10/19/18 (!) 302 lb (137 kg)  06/15/18 297 lb (134.7 kg)     Lab Results  Component Value Date   WBC 4.7 10/19/2018   HGB 11.9 (L) 10/19/2018   HCT 36.7 10/19/2018   PLT 304.0 10/19/2018   GLUCOSE 80 10/19/2018   CHOL 169 10/19/2018   TRIG 68.0 10/19/2018   HDL 64.40 10/19/2018   LDLDIRECT 128.5 04/30/2008   LDLCALC 91 10/19/2018   ALT 14 10/19/2018   AST 12 10/19/2018   NA 141 10/19/2018   K 3.9 10/19/2018   CL 105 10/19/2018   CREATININE 0.94 10/19/2018   BUN 19 10/19/2018   CO2 28 10/19/2018   TSH 3.00 10/19/2018    US Breast Ltd Uni Left Inc Axilla  Result Date: 06/07/2018 CLINICAL DATA:  58 year old patient recalled recent screening mammogram for evaluation 2 circumscribed left breast masses. EXAM: ULTRASOUND OF THE LEFT BREAST COMPARISON:  May 23, 2018 and earlier priors FINDINGS: Targeted ultrasound is performed, showing a circumscribed oval mass with well-defined posterior wall and posterior acoustic enhancement at 12 o'clock position 6 cm from  nipple measuring 1.6 x 0.6 x 1.3 cm. There are some internal echoes, and no internal vascular flow. Findings consistent with a mildly complicated benign cyst. Adjacent to this mass in the 12 o'clock position 4 cm from the nipple is a 0.9 x 0.9 x 0.7 cm circumscribed oval mass consistent with a simple cyst. There is no internal vascular flow. These cysts correspond in size, shape, and location to the mammographically detected masses. No suspicious mass identified on ultrasound. IMPRESSION: Benign cysts in the 12 o'clock axis of the left breast. No evidence of malignancy. RECOMMENDATION: Screening mammogram in one year.(Code:SM-B-01Y) I have discussed the findings and recommendations with the patient. Results were also  provided in writing at the conclusion of the visit. If applicable, a reminder letter will be sent to the patient regarding the next appointment. BI-RADS CATEGORY  2: Benign. Electronically Signed   By: Curlene Dolphin M.D.   On: 06/07/2018 14:57     Assessment & Plan:  Plan  I am having Lourena Simmonds start on cyclobenzaprine and meloxicam. I am also having her maintain her estradiol, hydroxychloroquine, Restasis, Cyanocobalamin (VITAMIN B-12 PO), amLODipine, naproxen sodium, losartan-hydrochlorothiazide, fluticasone, clotrimazole-betamethasone, omeprazole, levothyroxine, naftifine, and potassium chloride.  Meds ordered this encounter  Medications  . cyclobenzaprine (FLEXERIL) 10 MG tablet    Sig: Take 1 tablet (10 mg total) by mouth 3 (three) times daily as needed for muscle spasms.    Dispense:  30 tablet    Refill:  0  . meloxicam (MOBIC) 7.5 MG tablet    Sig: 1-2 po qd prn    Dispense:  60 tablet    Refill:  2    Problem List Items Addressed This Visit    None    Visit Diagnoses    Cervical strain, acute, initial encounter    -  Primary   Relevant Medications   cyclobenzaprine (FLEXERIL) 10 MG tablet   meloxicam (MOBIC) 7.5 MG tablet   Other Relevant Orders   Ambulatory  referral to Physical Therapy    ice/ heat PT  -- pt has gone to PT before for this rto prn   Follow-up: Return if symptoms worsen or fail to improve.  [name], DO

## 2018-12-18 ENCOUNTER — Other Ambulatory Visit: Payer: Self-pay | Admitting: Family Medicine

## 2018-12-18 DIAGNOSIS — I10 Essential (primary) hypertension: Secondary | ICD-10-CM

## 2018-12-30 ENCOUNTER — Other Ambulatory Visit: Payer: Self-pay | Admitting: Family Medicine

## 2018-12-30 DIAGNOSIS — R1013 Epigastric pain: Secondary | ICD-10-CM

## 2019-02-11 ENCOUNTER — Other Ambulatory Visit: Payer: Self-pay | Admitting: Family Medicine

## 2019-02-11 DIAGNOSIS — E876 Hypokalemia: Secondary | ICD-10-CM

## 2019-02-21 ENCOUNTER — Ambulatory Visit (INDEPENDENT_AMBULATORY_CARE_PROVIDER_SITE_OTHER): Payer: Managed Care, Other (non HMO) | Admitting: Family Medicine

## 2019-02-21 ENCOUNTER — Encounter (INDEPENDENT_AMBULATORY_CARE_PROVIDER_SITE_OTHER): Payer: Self-pay | Admitting: Family Medicine

## 2019-02-21 ENCOUNTER — Other Ambulatory Visit: Payer: Self-pay

## 2019-02-21 ENCOUNTER — Encounter: Payer: Self-pay | Admitting: Family Medicine

## 2019-02-21 VITALS — BP 106/54 | HR 70 | Temp 98.1°F | Ht 68.0 in | Wt 297.0 lb

## 2019-02-21 DIAGNOSIS — E538 Deficiency of other specified B group vitamins: Secondary | ICD-10-CM

## 2019-02-21 DIAGNOSIS — R0609 Other forms of dyspnea: Secondary | ICD-10-CM

## 2019-02-21 DIAGNOSIS — D509 Iron deficiency anemia, unspecified: Secondary | ICD-10-CM

## 2019-02-21 DIAGNOSIS — E039 Hypothyroidism, unspecified: Secondary | ICD-10-CM

## 2019-02-21 DIAGNOSIS — Z9189 Other specified personal risk factors, not elsewhere classified: Secondary | ICD-10-CM

## 2019-02-21 DIAGNOSIS — Z0289 Encounter for other administrative examinations: Secondary | ICD-10-CM

## 2019-02-21 DIAGNOSIS — R0600 Dyspnea, unspecified: Secondary | ICD-10-CM | POA: Diagnosis not present

## 2019-02-21 DIAGNOSIS — R5383 Other fatigue: Secondary | ICD-10-CM | POA: Diagnosis not present

## 2019-02-21 DIAGNOSIS — E66813 Obesity, class 3: Secondary | ICD-10-CM

## 2019-02-21 DIAGNOSIS — Z1331 Encounter for screening for depression: Secondary | ICD-10-CM

## 2019-02-21 DIAGNOSIS — Z6841 Body Mass Index (BMI) 40.0 and over, adult: Secondary | ICD-10-CM

## 2019-02-21 DIAGNOSIS — G4730 Sleep apnea, unspecified: Secondary | ICD-10-CM

## 2019-02-21 DIAGNOSIS — I10 Essential (primary) hypertension: Secondary | ICD-10-CM

## 2019-02-21 NOTE — Progress Notes (Signed)
Office: [phone]  /  Fax: [phone]   Dear Dr. Roma Schanz,   Thank you for referring [name] to our clinic. The following note includes my evaluation and treatment recommendations.  HPI:   Chief Complaint: OBESITY    [name] has been referred by Dr. Roma Schanz for consultation regarding her obesity and obesity related comorbidities.    [name] (MR# [id number]) is a 58 y.o. female who presents on 02/21/2019 for obesity evaluation and treatment. Current BMI is Body mass index is 45.16 kg/m.Marland Kitchen [name] has been struggling with her weight for many years and has been unsuccessful in either losing weight, maintaining weight loss, or reaching her healthy weight goal.     [name] attended our information session and states she is currently in the action stage of change and ready to dedicate time achieving and maintaining a healthier weight. [name] is interested in becoming our patient and working on intensive lifestyle modifications including (but not limited to) diet, exercise and weight loss.    [name] states her desired weight loss is 117 lbs she started gaining weight around 58 years of age her heaviest weight ever was 300 lbs. she is a picky eater and doesn't like to eat healthier foods  she skips lunch 2-3 times a week she is frequently drinking liquids with calories she has binge eating behaviors she struggles with emotional eating    [name] feels her energy is lower than it should be. This has worsened with weight gain and has not worsened recently. Jonni admits to daytime somnolence and  admits to waking up still tired. Patient is at risk for obstructive sleep apnea. Patent has a history of symptoms of daytime Andrea and morning headache. Patient generally gets 5-6 hours of sleep per night, and states they generally have restful sleep. Snoring is present. Apneic episodes are not present. Epworth Sleepiness Score is 13.  Dyspnea on  exertion [name] notes increasing shortness of breath with exercising and seems to be worsening over time with weight gain. She notes getting out of breath sooner with activity than she used to. This has gotten worse recently. Dazah denies orthopnea.  Hypothyroidism [name] has a diagnosis of hypothyroidism and started on levothyroxine last month. She has not noticed any change in her energy level yet. She denies hot or cold intolerance or palpitations, but does admit to ongoing Andrea.  Hypertension [name] is a 58 y.o. female with hypertension.  [name] denies chest pain or shortness of breath on exertion. She is working weight loss to help control her blood pressure with the goal of decreasing her risk of heart attack and stroke. Luka's blood pressure is relatively low today and at home.  Hyperlipidemia Shavana has hyperlipidemia and has been trying to improve her cholesterol levels with intensive lifestyle modification including a low saturated fat diet, exercise and weight loss. She denies any chest pain, claudication or myalgias.  Sleep Disordered Breathing Nyilah has a high Epworth score today of 13 with morning headaches and hypersomnia.  Depression Screen Kamron's Food and Mood (modified PHQ-9) score was 1. Depression screen PHQ 2/9 02/21/2019  Decreased Interest 1  Down, Depressed, Hopeless 0  PHQ - 2 Score 1  Altered sleeping 0  Tired, decreased energy 0  Change in appetite 0  Feeling bad or failure about yourself  0  Trouble concentrating 0  Moving slowly or fidgety/restless 0  Suicidal thoughts 0  PHQ-9 Score 1  Difficult doing  work/chores Not difficult at all    ASSESSMENT AND PLAN:  Other Andrea - Plan: EKG 12-Lead, Comprehensive metabolic panel, Hemoglobin A1c, Insulin, random, VITAMIN D 25 Hydroxy (Vit-D Deficiency, Fractures)  Dyspnea on exertion - Plan: Lipid Panel With LDL/HDL Ratio  Iron deficiency anemia, unspecified iron deficiency anemia type  - Plan: CBC with Differential/Platelet, Anemia panel  B12 deficiency - Plan: Vitamin B12  Hypothyroidism, unspecified type - Plan: T3, T4, free, TSH  Essential hypertension  Sleep disorder breathing - Plan: Ambulatory referral to Sleep Studies  Depression screening  At risk for osteoporosis  Class 3 severe obesity with serious comorbidity and body mass index (BMI) of 45.0 to 49.9 in adult, unspecified obesity type (HCC)  PLAN:  Andrea Lakeithia was informed that her Andrea may be related to obesity, depression or many other causes. Labs will be ordered, and in the meanwhile Sache has agreed to work on diet, exercise and weight loss to help with Andrea. Proper sleep hygiene was discussed including the need for 7-8 hours of quality sleep each night. A sleep study was ordered based on symptoms and Epworth score. Miana has a history of hypothyroidism, anemia, and hypertension (on medications) with relatively low SBP at baseline. [name] is managed by Dr. Amil Amen, Rheumatology. She is on Plaquenil. EKG was within norma llimits. She denies chest pain.  Dyspnea on exertion Bresha's shortness of breath appears to be obesity related and exercise induced. She has agreed to work on weight loss and gradually increase exercise to treat her exercise induced shortness of breath. If Glenola follows our instructions and loses weight without improvement of her shortness of breath, we will plan to refer to pulmonology. We will monitor this condition regularly. Maleyna agrees to this plan.  Hypothyroidism Emmerie was informed of the importance of good thyroid control to help with weight loss efforts. She was also informed that supertheraputic thyroid levels are dangerous and will not improve weight loss results. [name] will continue levothyroxine and will have thyroid panel rechecked.  Hypertension We discussed sodium restriction, working on healthy weight loss, and a regular exercise program as the means to  achieve improved blood pressure control. [name] agreed with this plan and agreed to follow up as directed. We will continue to monitor her blood pressure as well as her progress with the above lifestyle modifications. We will consider decreasing her medication going forward. She will watch for signs of hypotension as she continues her lifestyle modifications.  Hyperlipidemia [name] was informed of the American Heart Association Guidelines emphasizing intensive lifestyle modifications as the first line treatment for hyperlipidemia. We discussed many lifestyle modifications today in depth, and [name] will continue to work on decreasing saturated fats such as fatty red meat, butter and many fried foods. [name] will have labs rechecked. She will also increase vegetables and lean protein in her diet and continue to work on exercise and weight loss efforts.  Sleep Disordered Breathing [name] will be referred for a sleep study and will follow-up as directed.  Obstructive Sleep Apnea Risk Counseling [name] was given extended  (15 minutes) coronary artery disease prevention counseling today. She is 58 y.o. female and has risk factors for obstructive sleep apnea including obesity. We discussed intensive lifestyle modifications today with an emphasis on specific weight loss instructions and strategies.  Depression Screen [name] had a negative depression screening. Depression is commonly associated with obesity and often results in emotional eating behaviors. We will monitor this closely and work on CBT to help improve the non-hunger eating  patterns. Referral to Psychology may be required if no improvement is seen as she continues in our clinic.  Obesity Aronda is currently in the action stage of change and her goal is to continue with weight loss efforts. I recommend Zya begin the structured treatment plan as follows:  She has agreed to follow the Category 2 plan.  Nicolena has been instructed to exercise  as tolerated for weight loss and overall health benefits. We discussed the following Behavioral Modification Strategies today: increasing lean protein intake, decreasing simple carbohydrates, increasing vegetables, increase H20 intake, decrease liquid calories, work on meal planning and easy cooking plans, and better snacking choices.   She was informed of the importance of frequent follow-up visits to maximize her success with intensive lifestyle modifications for her multiple health conditions. She was informed we would discuss her lab results at her next visit unless there is a critical issue that needs to be addressed sooner. Woodie agreed to keep her next visit at the agreed upon time to discuss these results.  ALLERGIES: No Known Allergies  MEDICATIONS: Current Outpatient Medications on File Prior to Visit  Medication Sig Dispense Refill  . amLODipine (NORVASC) 10 MG tablet TAKE ONE TABLET BY MOUTH DAILY 90 tablet 1  . clotrimazole-betamethasone (LOTRISONE) cream Apply 1 application topically 2 (two) times daily. 30 g 0  . Cyanocobalamin (VITAMIN B-12 PO) Take 1 tablet by mouth daily.    Marland Kitchen estradiol (ESTRACE) 2 MG tablet Take 1 mg by mouth as needed.     . fluticasone (FLONASE) 50 MCG/ACT nasal spray INSTILL TWO SPRAYS INTO BOTH NOSTRIL DAILY. 16 g 2  . hydroxychloroquine (PLAQUENIL) 200 MG tablet Take 200 mg by mouth 2 (two) times daily.      Marland Kitchen levothyroxine (SYNTHROID) 50 MCG tablet TAKE ONE TABLET BY MOUTH EVERY MORNING BEFORE BREAKFAST 90 tablet 1  . losartan-hydrochlorothiazide (HYZAAR) 100-25 MG tablet TAKE ONE TABLET BY MOUTH DAILY 90 tablet 3  . naftifine (NAFTIN) 1 % cream Apply topically daily. 60 g 2  . naproxen sodium (ALEVE) 220 MG tablet Take 220 mg by mouth.    Marland Kitchen omeprazole (PRILOSEC) 20 MG capsule TAKE ONE CAPSULE BY MOUTH DAILY 90 capsule 1  . potassium chloride (KLOR-CON) 10 MEQ tablet TAKE TWO TABLETS BY MOUTH TWICE A DAY 120 tablet 5  . RESTASIS 0.05 % ophthalmic  emulsion Place 1 drop into both eyes daily.      No current facility-administered medications on file prior to visit.     PAST MEDICAL HISTORY: Past Medical History:  Diagnosis Date  . Abdominal pain, other specified site   . Acute upper respiratory infections of unspecified site   . Anemia   . Benign neoplasm of skin, site unspecified   . Disturbance of skin sensation   . Diverticulosis of colon (without mention of hemorrhage)   . Family history of diabetes mellitus   . Family history of ischemic heart disease   . Family history of malignant neoplasm of genital organ, other   . GERD (gastroesophageal reflux disease)   . Hypertension   . Hypokalemia   . Left shoulder pain   . Low blood potassium   . Post-menopausal   . Rheumatoid arthritis ([place])   . Sprain and strain of unspecified site of knee and leg   . Sprain of neck   . Thyroid disease   . Unspecified essential hypertension     PAST SURGICAL HISTORY: Past Surgical History:  Procedure Laterality Date  . ABDOMINAL HYSTERECTOMY    .  BUNIONECTOMY  2005   right foot  . SHOULDER SURGERY     left;spurs 2-09- dr Rhoderick Moody  . TOTAL ABDOMINAL HYSTERECTOMY W/ BILATERAL SALPINGOOPHORECTOMY  2002    SOCIAL HISTORY: Social History   Tobacco Use  . Smoking status: Never Smoker  . Smokeless tobacco: Never Used  Substance Use Topics  . Alcohol use: No  . Drug use: No    FAMILY HISTORY: Family History  Problem Relation Age of Onset  . Diabetes Mother   . Hypertension Mother   . Dementia Mother   . Stroke Mother   . Stroke Maternal Aunt   . Breast cancer Maternal Aunt   . Prostate cancer Father   . Hypertension Father   . Cancer Father   . Diabetes Brother   . Hypertension Sister   . Breast cancer Cousin   . Colon cancer Neg Hx    ROS: Review of Systems  Constitutional: Positive for malaise/Andrea.  HENT: Positive for sinus pain.        Positive for ear drainage. Positive for nose stuffiness.  Eyes: Positive  for blurred vision and double vision.       Positive for wearing glasses or contacts. Positive for floaters.  Respiratory: Negative for shortness of breath.        Positive for sleep disordered breathing.  Cardiovascular: Positive for palpitations. Negative for chest pain, orthopnea and claudication.  Gastrointestinal: Positive for heartburn.  Musculoskeletal: Positive for back pain and joint pain. Negative for myalgias.       Positive for leg cramping.  Skin: Positive for itching and rash.  Neurological: Positive for headaches.  Endo/Heme/Allergies:       Positive for cold intolerance.  Psychiatric/Behavioral: The patient has insomnia.    PHYSICAL EXAM: Blood pressure (!) 106/54, pulse 70, temperature 98.1 F (36.7 C), temperature source Oral, height 5\' 8"  (1.727 m), weight 297 lb (134.7 kg), SpO2 99 %. Body mass index is 45.16 kg/m. Physical Exam Vitals signs reviewed.  Constitutional:      Appearance: Normal appearance. She is well-developed. She is obese.  HENT:     Head: Normocephalic and atraumatic.     Nose: Nose normal.  Eyes:     General: No scleral icterus. Neck:     Musculoskeletal: Normal range of motion.  Cardiovascular:     Rate and Rhythm: Normal rate and regular rhythm.  Pulmonary:     Effort: Pulmonary effort is normal. No respiratory distress.  Abdominal:     Palpations: Abdomen is soft.     Tenderness: There is no abdominal tenderness.  Musculoskeletal: Normal range of motion.     Comments: Range of motion normal in all four extremities.  Skin:    General: Skin is warm and dry.  Neurological:     Mental Status: She is alert and oriented to person, place, and time.     Coordination: Coordination normal.  Psychiatric:        Mood and Affect: Mood and affect normal.        Behavior: Behavior normal.   RECENT LABS AND TESTS: BMET    Component Value Date/Time   NA 141 10/19/2018 0900   K 3.9 10/19/2018 0900   CL 105 10/19/2018 0900   CO2 28  10/19/2018 0900   GLUCOSE 80 10/19/2018 0900   GLUCOSE 74 03/03/2006 1039   BUN 19 10/19/2018 0900   CREATININE 0.94 10/19/2018 0900   CALCIUM 8.9 10/19/2018 0900   GFRNONAA 79.39 03/04/2010 1025   GFRAA 86 04/30/2008  0000   No results found for: HGBA1C No results found for: INSULIN CBC    Component Value Date/Time   WBC 4.7 10/19/2018 0900   RBC 4.26 10/19/2018 0900   HGB 11.9 (L) 10/19/2018 0900   HCT 36.7 10/19/2018 0900   PLT 304.0 10/19/2018 0900   MCV 86.2 10/19/2018 0900   MCV 85.8 08/02/2011 1839   MCH 27.1 08/02/2011 1839   MCHC 32.5 10/19/2018 0900   RDW 13.8 10/19/2018 0900   LYMPHSABS 1.5 10/19/2018 0900   MONOABS 0.4 10/19/2018 0900   EOSABS 0.1 10/19/2018 0900   BASOSABS 0.1 10/19/2018 0900   Iron/TIBC/Ferritin/ %Sat    Component Value Date/Time   IRON 59 01/20/2012 1000   FERRITIN 78.1 01/20/2012 1000   IRONPCTSAT 19.9 (L) 01/20/2012 1000   Lipid Panel     Component Value Date/Time   CHOL 169 10/19/2018 0900   TRIG 68.0 10/19/2018 0900   HDL 64.40 10/19/2018 0900   CHOLHDL 3 10/19/2018 0900   VLDL 13.6 10/19/2018 0900   LDLCALC 91 10/19/2018 0900   LDLDIRECT 128.5 04/30/2008 0000   Hepatic Function Panel     Component Value Date/Time   PROT 6.7 10/19/2018 0900   ALBUMIN 3.8 10/19/2018 0900   AST 12 10/19/2018 0900   ALT 14 10/19/2018 0900   ALKPHOS 58 10/19/2018 0900   BILITOT 0.7 10/19/2018 0900   BILIDIR 0.1 08/08/2014 1520      Component Value Date/Time   TSH 3.00 10/19/2018 0900   TSH 2.61 06/15/2018 1021   TSH 2.80 12/23/2017 0800   No results found for: Vitamin D, 25-Hydroxy  ECG  shows sinus rhythm with a rate of 75 BPM. Low voltage in precordial leads. Left axis -anterior fascicular block. Poor R-wave progression - may be secondary to pulmonary disease consider old anterior infarct. Abnormal.  INDIRECT CALORIMETER done today shows a VO2 of 202 and a REE of 1407.  Her calculated basal metabolic rate is [id number] thus her basal  metabolic rate is worse than expected.  OBESITY BEHAVIORAL INTERVENTION VISIT  Today's visit was #1  Starting weight: 297 lbs Starting date: 02/21/2019 Today's weight: 297 lbs  Today's date: 02/21/2019 Total lbs lost to date: 0    02/21/2019  Height 5\' 8"  (1.727 m)  Weight 297 lb (134.7 kg)  BMI (Calculated) 45.17  BLOOD PRESSURE - SYSTOLIC [id number]  BLOOD PRESSURE - DIASTOLIC 54   Body Fat % [id number] %  RMR 1407   ASK: We discussed the diagnosis of obesity with Lourena Simmonds today and Dhana agreed to give [place] permission to discuss obesity behavioral modification therapy today.  ASSESS: Alasiah has the diagnosis of obesity and her BMI today is 45.2. Laine is in the action stage of change.   ADVISE: Indu was educated on the multiple health risks of obesity as well as the benefit of weight loss to improve her health. She was advised of the need for long term treatment and the importance of lifestyle modifications to improve her current health and to decrease her risk of future health problems.  AGREE: Multiple dietary modification options and treatment options were discussed and  Dionisia agreed to follow the recommendations documented in the above note.  ARRANGE: Limor was educated on the importance of frequent visits to treat obesity as outlined per CMS and USPSTF guidelines and agreed to schedule her next follow up appointment today.  IMichaelene Song, am acting as Location manager for [employer]. Juleen China, DO  I have reviewed the above documentation  for accuracy and completeness, and I agree with the above. Briscoe Deutscher, DO

## 2019-02-22 LAB — TSH: TSH: 1.85 u[IU]/mL (ref 0.450–4.500)

## 2019-02-22 LAB — T4, FREE: Free T4: 1.29 ng/dL (ref 0.82–1.77)

## 2019-02-23 LAB — CBC WITH DIFFERENTIAL/PLATELET
Basophils Absolute: 0.1 10*3/uL (ref 0.0–0.2)
Basos: 1 %
EOS (ABSOLUTE): 0.3 10*3/uL (ref 0.0–0.4)
Eos: 6 %
Hemoglobin: 11.8 g/dL (ref 11.1–15.9)
Immature Grans (Abs): 0 10*3/uL (ref 0.0–0.1)
Immature Granulocytes: 0 %
Lymphocytes Absolute: 1.3 10*3/uL (ref 0.7–3.1)
Lymphs: 28 %
MCH: 28.7 pg (ref 26.6–33.0)
MCHC: 33.3 g/dL (ref 31.5–35.7)
MCV: 86 fL (ref 79–97)
Monocytes Absolute: 0.4 10*3/uL (ref 0.1–0.9)
Monocytes: 8 %
Neutrophils Absolute: 2.6 10*3/uL (ref 1.4–7.0)
Neutrophils: 57 %
Platelets: 183 10*3/uL (ref 150–450)
RBC: 4.11 x10E6/uL (ref 3.77–5.28)
RDW: 12.9 % (ref 11.7–15.4)
WBC: 4.5 10*3/uL (ref 3.4–10.8)

## 2019-02-23 LAB — LIPID PANEL WITH LDL/HDL RATIO
Cholesterol, Total: 161 mg/dL (ref 100–199)
HDL: 67 mg/dL (ref >39)
LDL Chol Calc (NIH): 81 mg/dL (ref 0–99)
LDL/HDL Ratio: 1.2 ratio (ref 0.0–3.2)
Triglycerides: 66 mg/dL (ref 0–149)
VLDL Cholesterol Cal: 13 mg/dL (ref 5–40)

## 2019-02-23 LAB — COMPREHENSIVE METABOLIC PANEL
ALT: 14 IU/L (ref 0–32)
AST: 13 IU/L (ref 0–40)
Albumin/Globulin Ratio: 1.3 (ref 1.2–2.2)
Albumin: 3.7 g/dL — ABNORMAL LOW (ref 3.8–4.9)
Alkaline Phosphatase: 61 IU/L (ref 39–117)
BUN/Creatinine Ratio: 20 (ref 9–23)
BUN: 19 mg/dL (ref 6–24)
Bilirubin Total: 0.5 mg/dL (ref 0.0–1.2)
CO2: 26 mmol/L (ref 20–29)
Calcium: 8.9 mg/dL (ref 8.7–10.2)
Chloride: 104 mmol/L (ref 96–106)
Creatinine, Ser: 0.96 mg/dL (ref 0.57–1.00)
GFR calc Af Amer: 75 mL/min/{1.73_m2} (ref >59)
GFR calc non Af Amer: 65 mL/min/{1.73_m2} (ref >59)
Globulin, Total: 2.8 g/dL (ref 1.5–4.5)
Glucose: 75 mg/dL (ref 65–99)
Potassium: 3.9 mmol/L (ref 3.5–5.2)
Sodium: 142 mmol/L (ref 134–144)
Total Protein: 6.5 g/dL (ref 6.0–8.5)

## 2019-02-23 LAB — ANEMIA PANEL
Ferritin: 45 ng/mL (ref 15–150)
Folate, Hemolysate: 319 ng/mL
Folate, RBC: 901 ng/mL (ref >498)
Hematocrit: 35.4 % (ref 34.0–46.6)
Iron Saturation: 22 % (ref 15–55)
Iron: 63 ug/dL (ref 27–159)
Retic Ct Pct: 1.6 % (ref 0.6–2.6)
Total Iron Binding Capacity: 282 ug/dL (ref 250–450)
UIBC: 219 ug/dL (ref 131–425)
Vitamin B-12: 673 pg/mL (ref 232–1245)

## 2019-02-23 LAB — INSULIN, RANDOM: INSULIN: 8.2 u[IU]/mL (ref 2.6–24.9)

## 2019-02-23 LAB — HEMOGLOBIN A1C
Est. average glucose Bld gHb Est-mCnc: 108 mg/dL
Hgb A1c MFr Bld: 5.4 % (ref 4.8–5.6)

## 2019-02-23 LAB — T3: T3, Total: 109 ng/dL (ref 71–180)

## 2019-02-23 LAB — VITAMIN D 25 HYDROXY (VIT D DEFICIENCY, FRACTURES): Vit D, 25-Hydroxy: 44.1 ng/mL (ref 30.0–100.0)

## 2019-02-26 ENCOUNTER — Encounter (INDEPENDENT_AMBULATORY_CARE_PROVIDER_SITE_OTHER): Payer: Self-pay | Admitting: Family Medicine

## 2019-03-01 ENCOUNTER — Other Ambulatory Visit: Payer: Self-pay

## 2019-03-01 ENCOUNTER — Ambulatory Visit (INDEPENDENT_AMBULATORY_CARE_PROVIDER_SITE_OTHER): Payer: Managed Care, Other (non HMO) | Admitting: Neurology

## 2019-03-01 ENCOUNTER — Encounter: Payer: Self-pay | Admitting: Neurology

## 2019-03-01 VITALS — BP 118/72 | HR 69 | Temp 97.7°F | Ht 68.0 in | Wt 305.0 lb

## 2019-03-01 DIAGNOSIS — G4719 Other hypersomnia: Secondary | ICD-10-CM | POA: Insufficient documentation

## 2019-03-01 DIAGNOSIS — M170 Bilateral primary osteoarthritis of knee: Secondary | ICD-10-CM | POA: Diagnosis not present

## 2019-03-01 DIAGNOSIS — M329 Systemic lupus erythematosus, unspecified: Secondary | ICD-10-CM

## 2019-03-01 DIAGNOSIS — K219 Gastro-esophageal reflux disease without esophagitis: Secondary | ICD-10-CM | POA: Diagnosis not present

## 2019-03-01 DIAGNOSIS — Z6841 Body Mass Index (BMI) 40.0 and over, adult: Secondary | ICD-10-CM

## 2019-03-01 DIAGNOSIS — E662 Morbid (severe) obesity with alveolar hypoventilation: Secondary | ICD-10-CM | POA: Insufficient documentation

## 2019-03-01 NOTE — Patient Instructions (Signed)
Hypoventilation Syndrome  Obesity hypoventilation syndrome (OHS) means that you are not breathing well enough to get air in and out of your lungs efficiently (ventilation). This causes a low oxygen level and a high carbon dioxide level in your blood (hypoventilation). Having too much total body fat (obesity) is a significant risk factor for developing OHS. OHS makes it harder for your heart to pump oxygen-rich blood to your body. It can cause sleep disturbances and make you feel sleepy during the day. Over time, OHS can increase your risk for:  Heart disease.  High blood pressure (hypertension).  Reduced ability to absorb sugar from the bloodstream (insulin resistance).  Heart failure. Over time, OHS weakens your heart and can lead to heart failure. What are the causes? The exact cause of OHS is not known. Possible causes include:  Pressure on the lungs from excess body weight.  Obesity-related changes in how much air the lungs can hold (lung capacity) and how much they can expand (lung compliance).  Failure of the brain to regulate oxygen and carbon dioxide levels properly.  Chemicals (hormones) produced by excess fat cells interfering with breathing regulation.  A breathing condition in which breathing pauses or becomes shallow during sleep (sleep apnea). This condition can eventually cause the body to ventilate poorly and to hold onto carbon dioxide during the day. What increases the risk? You may have a greater risk for OHS if you:  Have a BMI of 30 or higher. BMI is an estimate of body fat that is calculated from height and weight. For adults, a BMI of 30 or higher is considered obese.  Are 2?58 years old.  Carry most of your excess weight around your waist.  Experience moderate symptoms of sleep apnea. What are the signs or symptoms? The most common symptoms of OHS are:  Daytime sleepiness.  Lack of energy.  Shortness of breath.  Morning headaches.  Sleep apnea.   Trouble concentrating.  Irritability, mood swings, or depression.  Swollen veins in the neck.  Swelling of the legs. How is this diagnosed? Your health care provider may suspect OHS if you are obese and have poor breathing during the day and at night. Your health care provider will also do a physical exam. You may have tests to:  Measure your BMI.  Measure your blood oxygen level with a sensor placed on your finger (pulse oximetry).  Measure blood oxygen and carbon dioxide in a blood sample.  Measure the amount of red blood cells in a blood sample. OHS causes the number of red blood cells you have to increase (polycythemia).  Check your breathing ability (pulmonary function testing).  Check your breathing ability, breathing patterns, and oxygen level while you sleep (sleep study). You may also have a chest X-ray to rule out other breathing problems. You may have an electrocardiogram (ECG) and or echocardiogram to check for signs of heart failure. How is this treated? Weight loss is the most important part of treatment for OHS, and it may be the only treatment that you need. Other treatments may include:  Using a device to open your airway while you sleep, such as a continuous positive airway pressure (CPAP) machine that delivers oxygen to your airway through a mask.  Surgery (gastric bypass surgery) to lower your BMI. This may be needed if: ? You are very obese. ? Other treatments have not worked for you. ? Your OHS is very severe and is causing organ damage, such as heart failure. Follow these instructions  at home:  Medicines  Take over-the-counter and prescription medicines only as told by your health care provider.  Ask your health care provider what medicines are safe for you. You may be told to avoid medicines that can impair breathing and make OHS worse, such as sedatives and narcotics. Sleeping habits  If you are prescribed a CPAP machine, make sure you understand and  use the machine as directed.  Try to get 8 hours of sleep every night.  Go to bed at the same time every night, and get up at the same time every day. General instructions  Work with your health care provider to make a diet and exercise plan that helps you reach and maintain a healthy weight.  Eat a healthy diet.  Avoid smoking.  Exercise regularly as told by your health care provider.  During the evening, do not drink caffeine and do not eat heavy meals.  Keep all follow-up visits as told by your health care provider. This is important. Contact a health care provider if:  You experience new or worsening shortness of breath.  You have chest pain.  You have an irregular heartbeat (palpitations).  You have dizziness.  You faint.  You develop a cough.  You have a fever.  You have chest pain when you breathe (pleurisy). This information is not intended to replace advice given to you by your health care provider. Make sure you discuss any questions you have with your health care provider. Document Released: 09/15/2015 Document Revised: 07/28/2018 Document Reviewed: 09/15/2015 Elsevier Patient Education  2020 [employer].

## 2019-03-01 NOTE — Progress Notes (Signed)
SLEEP MEDICINE CLINIC    Provider:  Larey Seat, MD  Primary Care Physician:  Carollee Herter, Alferd Apa, DO [address] [place] [address]     Referring Provider:  [facility].        Chief Complaint according to patient   Patient presents with:    . New Patient (Initial Visit)           HISTORY OF PRESENT ILLNESS:  [name] is a 58 y.o. year old female patient is seen on 03/01/2019   I have the pleasure of seeing [name] today, a right-handed [place] American   female referred by medical health and wellness- with a possible sleep disorder.  She has a past medical history of Abdominal pain, other specified site, Acute upper respiratory infections of unspecified site, Anemia, Benign neoplasm of skin, site unspecified, morbid obesity/ BMI 46. Disturbance of skin sensation, Diverticulosis of colon (without mention of hemorrhage),  GERD (gastroesophageal reflux disease), Hypertension, Hypokalemia,Low blood potassium, Post-menopausal, Rheumatoid arthritis ([place]),  Thyroid disease, and essential hypertension. The patient has undergone metabolic rate testing at weight [facility] wellness.    Sleep relevant medical history: patient was told she snores,  Nocturia; 2-3 , no Tonsillectomy. Family medical /sleep history: Family history of diabetes mellitus, Family history of ischemic heart disease, Family history of malignant neoplasm of genital organ, and of asthma, other family member on CPAP with OSA.     Social history:  Patient is working as an Marine scientist at [employer].  she lives in a household with persons. Family status is married , without children.  The patient currently works from 8-18.00 Mo- Thursday. Pets are not  present.Tobacco use: none- but grew up with passive smoke-  ETOH use- seldomly , Caffeine intake in form of Coffee( 2 AM ) Soda( 1/ day ) Tea ( seldom) or energy drinks.  Hobbies :mary kay.   Sleep habits are as follows:   The patient's dinner time is between 7-8 PM, it;s rather a snack , like a fruit, but not full of protein.. The patient goes to bed at 11.30 PM and continues to sleep for several hours, wakes for 2.30 and 4 AM bathroom breaks.    The preferred sleep position is prone and she often sleep on her side, with the support of 1 pillow. Dreams are reportedly rare.   6.30 AM is the usual rise time.  The patient wakes up spontaneously at 5.30 without an alarm ( set at 6 h AM ) .  She reports not feeling refreshed or restored in AM, with symptoms such as  morning headaches ( 2-3 times 2-3 week). Naps are taken infrequently, tries to power nap for 30 minutes - and are refreshing.  Review of Systems: Out of a complete 14 system review, the patient complains of only the following symptoms, and all other reviewed systems are negative.:  Fatigue, sleepiness , snoring, fragmented sleep,Nocturia, snores. No apnea witnessed.    How likely are you to doze in the following situations: 0 = not likely, 1 = slight chance, 2 = moderate chance, 3 = high chance   Sitting and Reading? Watching Television? Sitting inactive in a public place (theater or meeting)? As a passenger in a car for an hour without a break? Lying down in the afternoon when circumstances permit? Sitting and talking to someone? Sitting quietly after lunch without alcohol? In a car, while stopped for a  few minutes in traffic?   Total = 14/ 24 points   FSS endorsed at 14/ 63 points.   Social History   Socioeconomic History  . Marital status: Married    Spouse name: Not on file  . Number of children: 0  . Years of education: Not on file  . Highest education level: Not on file  Occupational History  . Occupation: Retail buyer: Public house manager  Social Needs  . Financial resource strain: Not on file  . Food insecurity    Worry: Not on file    Inability: Not on file  . Transportation needs    Medical: Not on file     Non-medical: Not on file  Tobacco Use  . Smoking status: Never Smoker  . Smokeless tobacco: Never Used  Substance and Sexual Activity  . Alcohol use: No  . Drug use: No  . Sexual activity: Yes    Partners: Male  Lifestyle  . Physical activity    Days per week: Not on file    Minutes per session: Not on file  . Stress: Not on file  Relationships  . Social Herbalist on phone: Not on file    Gets together: Not on file    Attends religious service: Not on file    Active member of club or organization: Not on file    Attends meetings of clubs or organizations: Not on file    Relationship status: Not on file  Other Topics Concern  . Not on file  Social History Narrative   Regular exercise- joined [employer]--- plans to go 5 days a week    Family History  Problem Relation Age of Onset  . Diabetes Mother   . Hypertension Mother   . Dementia Mother   . Stroke Mother   . Stroke Maternal Aunt   . Breast cancer Maternal Aunt   . Prostate cancer Father   . Hypertension Father   . Cancer Father   . Diabetes Brother   . Hypertension Sister   . Breast cancer Cousin   . Colon cancer Neg Hx     Past Medical History:  Diagnosis Date  . Abdominal pain, other specified site   . Acute upper respiratory infections of unspecified site   . Anemia   . Benign neoplasm of skin, site unspecified   . Disturbance of skin sensation   . Diverticulosis of colon (without mention of hemorrhage)   . Family history of diabetes mellitus   . Family history of ischemic heart disease   . Family history of malignant neoplasm of genital organ, other   . GERD (gastroesophageal reflux disease)   . Hypertension   . Hypokalemia   . Left shoulder pain   . Low blood potassium   . Post-menopausal   . Rheumatoid arthritis ([place])   . Sprain and strain of unspecified site of knee and leg   . Sprain of neck   . Thyroid disease   . Unspecified essential hypertension     Past Surgical History:   Procedure Laterality Date  . ABDOMINAL HYSTERECTOMY    . BUNIONECTOMY  2005   right foot  . SHOULDER SURGERY     left;spurs 2-09- dr Rhoderick Moody  . TOTAL ABDOMINAL HYSTERECTOMY W/ BILATERAL SALPINGOOPHORECTOMY  2002     Current Outpatient Medications on File Prior to Visit  Medication Sig Dispense Refill  . amLODipine (NORVASC) 10 MG tablet TAKE ONE TABLET BY MOUTH DAILY 90  tablet 1  . clotrimazole-betamethasone (LOTRISONE) cream Apply 1 application topically 2 (two) times daily. 30 g 0  . Cyanocobalamin (VITAMIN B-12 PO) Take 1 tablet by mouth daily.    Marland Kitchen estradiol (ESTRACE) 2 MG tablet Take 1 mg by mouth as needed.     . fluticasone (FLONASE) 50 MCG/ACT nasal spray INSTILL TWO SPRAYS INTO BOTH NOSTRIL DAILY. 16 g 2  . hydroxychloroquine (PLAQUENIL) 200 MG tablet Take 200 mg by mouth 2 (two) times daily.      Marland Kitchen levothyroxine (SYNTHROID) 50 MCG tablet TAKE ONE TABLET BY MOUTH EVERY MORNING BEFORE BREAKFAST 90 tablet 1  . losartan-hydrochlorothiazide (HYZAAR) 100-25 MG tablet TAKE ONE TABLET BY MOUTH DAILY 90 tablet 3  . naftifine (NAFTIN) 1 % cream Apply topically daily. 60 g 2  . naproxen sodium (ALEVE) 220 MG tablet Take 220 mg by mouth.    Marland Kitchen omeprazole (PRILOSEC) 20 MG capsule TAKE ONE CAPSULE BY MOUTH DAILY 90 capsule 1  . potassium chloride (KLOR-CON) 10 MEQ tablet TAKE TWO TABLETS BY MOUTH TWICE A DAY 120 tablet 5  . RESTASIS 0.05 % ophthalmic emulsion Place 1 drop into both eyes daily.      No current facility-administered medications on file prior to visit.      Physical exam:  Today's Vitals   03/01/19 1302  BP: 118/72  Pulse: 69  Temp: 97.7 F (36.5 C)  Weight: (!) 305 lb (138.3 kg)  Height: 5\' 8"  (1.727 m)   Body mass index is 46.38 kg/m.   Wt Readings from Last 3 Encounters:  03/01/19 (!) 305 lb (138.3 kg)  02/21/19 297 lb (134.7 kg)  12/11/18 (!) 300 lb 6.4 oz (136.3 kg)     Ht Readings from Last 3 Encounters:  03/01/19 5\' 8"  (1.727 m)  02/21/19 5\' 8"  (1.727  m)  12/11/18 5\' 8"  (1.727 m)      General: The patient is awake, alert and appears not in acute distress. The patient is well groomed. Head: Normocephalic, atraumatic. Neck is supple.  Mallampati 3,  neck circumference:16 inches.  Nasal airflow is patent.  Retrognathia is mild.  Dental status: intact Cardiovascular:  Regular rate and cardiac rhythm by pulse,  without distended neck veins. Respiratory: Lungs are clear to auscultation.  Skin:  With evidence of lower extremity edema, but not rash. Trunk: The patient's posture is erect.   Neurologic exam : The patient is awake and alert, oriented to place and time.   Memory subjective described as intact.  Attention span & concentration ability appears normal.  Speech is fluent,  without  dysarthria, but mild dysphonia.  Mood and affect are appropriate.    Cranial nerves: reduced sense of smell but intact taste reported  Pupils are equal and briskly reactive to light. Funduscopic exam deferred. .  Extraocular movements in vertical and horizontal planes were intact and without nystagmus. No Diplopia. Visual fields by finger perimetry are intact. Hearing was intact to soft voice and finger rubbing.   Facial sensation intact to fine touch. Facial motor strength is symmetric and tongue and uvula move midline.  Neck ROM : rotation, tilt and flexion extension were normal for age and shoulder shrug was symmetrical.    Motor exam:  Symmetric bulk, tone and ROM.   Normal tone without cog wheeling, symmetric grip strength . She does hold on to a handle/ rail when descending stairs due to arthritic knees.    Sensory:  Fine touch, pinprick and vibration were tested  and normal.  Proprioception tested in the upper extremities was normal.   Coordination: Rapid alternating movements in the fingers/hands were of normal speed.  The Finger-to-nose maneuver was intact without evidence of ataxia, dysmetria or tremor.   Gait and station:  Patient  could rise unassisted from a seated position, walked without assistive device.  Stance is of normal width/ base and the patient turned with 4 steps.  Toe and heel walk were deferred.  Deep tendon reflexes: in the upper and lower extremities are symmetric and intact.  Babinski response was deferred .       After spending a total time of 35 minutes face to face and additional time for physical and neurologic examination, review of laboratory studies,  personal review of imaging studies, reports and results of other testing and review of referral information / records as far as provided in visit, I have established the following assessments:  1) Morbid obesity, Low energy, fatigue, SOB - on exertion, hypothyroidism, HTN , and snoring.    2) Epworth score of 14/ 24  in office. Excessive daytime sleepiness.   3) sleep hygiene- she sleeps with the TV on, lets change that.    My Plan is to proceed with:  1) HST - this patient is covered by [place]. An attended sleep study is not covered.  2) Changing sleep habits, discussed and brochure handed to patient.  3) RV in 2-3 month.  I would like to thank,  Briscoe Deutscher , DO and  Carollee Herter, [place], DO  [address],  [place] [address] for allowing me to meet with and to take care of this pleasant patient.   In short, [name] is presenting with EDS, many risks for OSA and poor sleep habits.   I plan to follow up either personally or through our NP within 2-3 month.  Electronically signed by: Larey Seat, MD 03/01/2019 1:11 PM  [facility] [place] certified by The [employer] of Sleep Medicine and Diplomate of the [employer] of Sleep Medicine. Board certified In Neurology through the [place], Fellow of the [employer] of Neurology. Medical Director of [employer].

## 2019-03-07 ENCOUNTER — Ambulatory Visit (INDEPENDENT_AMBULATORY_CARE_PROVIDER_SITE_OTHER): Payer: Managed Care, Other (non HMO) | Admitting: Family Medicine

## 2019-03-07 ENCOUNTER — Other Ambulatory Visit: Payer: Self-pay

## 2019-03-07 ENCOUNTER — Encounter (INDEPENDENT_AMBULATORY_CARE_PROVIDER_SITE_OTHER): Payer: Self-pay | Admitting: Family Medicine

## 2019-03-07 VITALS — BP 110/68 | HR 73 | Temp 98.2°F | Ht 68.0 in | Wt 296.0 lb

## 2019-03-07 DIAGNOSIS — I10 Essential (primary) hypertension: Secondary | ICD-10-CM | POA: Diagnosis not present

## 2019-03-07 DIAGNOSIS — G4730 Sleep apnea, unspecified: Secondary | ICD-10-CM

## 2019-03-07 DIAGNOSIS — E8881 Metabolic syndrome: Secondary | ICD-10-CM

## 2019-03-07 DIAGNOSIS — E038 Other specified hypothyroidism: Secondary | ICD-10-CM | POA: Diagnosis not present

## 2019-03-07 DIAGNOSIS — Z6841 Body Mass Index (BMI) 40.0 and over, adult: Secondary | ICD-10-CM

## 2019-03-07 DIAGNOSIS — E88819 Insulin resistance, unspecified: Secondary | ICD-10-CM | POA: Insufficient documentation

## 2019-03-07 DIAGNOSIS — E559 Vitamin D deficiency, unspecified: Secondary | ICD-10-CM | POA: Insufficient documentation

## 2019-03-07 DIAGNOSIS — K5790 Diverticulosis of intestine, part unspecified, without perforation or abscess without bleeding: Secondary | ICD-10-CM | POA: Insufficient documentation

## 2019-03-07 DIAGNOSIS — Z9189 Other specified personal risk factors, not elsewhere classified: Secondary | ICD-10-CM | POA: Diagnosis not present

## 2019-03-07 MED ORDER — LOSARTAN POTASSIUM 100 MG PO TABS: 100.0000 mg | ORAL_TABLET | Freq: Every day | ORAL | 0 refills | Status: DC

## 2019-03-07 MED ORDER — HYDROCHLOROTHIAZIDE 25 MG PO TABS: 25.0000 mg | ORAL_TABLET | Freq: Every day | ORAL | 0 refills | Status: DC

## 2019-03-12 ENCOUNTER — Encounter (INDEPENDENT_AMBULATORY_CARE_PROVIDER_SITE_OTHER): Payer: Self-pay | Admitting: Family Medicine

## 2019-03-12 NOTE — Progress Notes (Signed)
Office: [phone]  /  Fax: [phone]   HPI:   Chief Complaint: OBESITY [name] is here to discuss her progress with her obesity treatment plan. She is on the Category 2 plan and is following her eating plan approximately 75 % of the time. She states she is walking 5,000 steps, 7 times per week. [name] is struggling with low fat yogurt and milk. She has no hunger issues. [name] has a sleep study on December 2nd. Her weight is 296 lb (134.3 kg) today and has had a weight loss of 1 pound over a period of 2 weeks since her last visit. She has lost 1 lb since starting treatment with [place].  Vitamin D deficiency [name] has a diagnosis of vitamin D deficiency. [name] is currently taking OTC vit D 1,000 daily and she denies nausea, vomiting or muscle weakness.  At risk for osteopenia and osteoporosis [name] is at higher risk of osteopenia and osteoporosis due to vitamin D deficiency.   Insulin Resistance (mild) [name] has a diagnosis of insulin resistance based on her elevated fasting insulin level >5 (8.2 on 02/21/19). Although [name]'s blood glucose readings are still under good control, insulin resistance puts her at greater risk of metabolic syndrome and diabetes. Her last A1c was at 5.4 on 02/21/19. She is not on medications currently. [name] continues to work on diet and exercise to decrease risk of diabetes. [name] denies hyperphagia.  Hypothyroidism [name] has a diagnosis of hypothyroidism. [name] is on levothyroxine and she is at goal. She was reminded to take levothyroxine with water, on an empty stomach. She denies hot or cold intolerance or palpitations.  Sleep disorder breathing [name] has a diagnosis of sleep disorder breathing and she has a sleep study on December 2nd.  Hypertension [name] is a 58 y.o. female with hypertension. She is on Losartan and HCTZ, and she feels that her medication is making her tired in the early afternoon. [name] denies chest pain or  shortness of breath on exertion. She is working weight loss to help control her blood pressure with the goal of decreasing her risk of heart attack and stroke. [name] blood pressure is currently controlled.  ASSESSMENT AND PLAN:  Insulin resistance  Vitamin D deficiency  Essential hypertension - Plan: losartan (COZAAR) 100 MG tablet, hydrochlorothiazide (HYDRODIURIL) 25 MG tablet  Other specified hypothyroidism  At risk for osteoporosis  Class 3 severe obesity with serious comorbidity and body mass index (BMI) of 45.0 to 49.9 in adult, unspecified obesity type ([place])  PLAN:  Vitamin D Deficiency [name] was informed that low vitamin D levels contributes to fatigue and are associated with obesity, breast, and colon cancer. [name] agrees to increase OTC Vit D to 2,000 IU daily and she will follow up for routine testing of vitamin D, at least 2-3 times per year. She was informed of the risk of over-replacement of vitamin D and agrees to not increase her dose unless she discusses this with [place] first. Autrey agrees to follow up with our clinic in 2 weeks.  At risk for osteopenia and osteoporosis [name] was given extended  (15 minutes) osteoporosis prevention counseling today. [name] is at risk for osteopenia and osteoporosis due to her vitamin D deficiency. She was encouraged to take her vitamin D and follow her higher calcium diet and increase strengthening exercise to help strengthen her bones and decrease her risk of osteopenia and osteoporosis.  Insulin Resistance (mild) [name] will continue to work on weight loss, exercise, and decreasing  simple carbohydrates in her diet to help decrease the risk of diabetes. She was informed that eating too many simple carbohydrates or too many calories at one sitting increases the likelihood of GI side effects. We will monitor and [name] will follow up with [place] as directed to monitor her progress.  Hypothyroidism [name] was informed of the importance of  good thyroid control to help with weight loss efforts. She was also informed that supertherapeutic thyroid levels are dangerous and will not improve weight loss results. [name] will continue levothyroxine and follow up as directed.  Sleep disorder breathing We will follow along and [name] will follow up at the agreed upon time.  Hypertension We discussed sodium restriction, working on healthy weight loss, and a regular exercise program as the means to achieve improved blood pressure control. Jennings agreed with this plan and agreed to follow up as directed. We will continue to monitor her blood pressure as well as her progress with the above lifestyle modifications. Markaila agrees to split her medication into two medications and take Losartan at night. She will watch for signs of hypotension as she continues her lifestyle modifications.  Obesity [name] is currently in the action stage of change. As such, her goal is to continue with weight loss efforts She has agreed to follow the Category 2 plan [name] has been instructed to work up to a goal of 150 minutes of combined cardio and strengthening exercise per week or as tolerated, for weight loss and overall health benefits. We discussed the following Behavioral Modification Strategies today: increase H2O intake, better snacking choices, increasing lean protein intake, decreasing simple carbohydrates, increasing vegetables, work on meal planning and easy cooking plans and decrease junk food  Deavian has agreed to follow up with our clinic in 2 weeks. She was informed of the importance of frequent follow up visits to maximize her success with intensive lifestyle modifications for her multiple health conditions.  ALLERGIES: No Known Allergies  MEDICATIONS: Current Outpatient Medications on File Prior to Visit  Medication Sig Dispense Refill  . amLODipine (NORVASC) 10 MG tablet TAKE ONE TABLET BY MOUTH DAILY 90 tablet 1  . clotrimazole-betamethasone  (LOTRISONE) cream Apply 1 application topically 2 (two) times daily. 30 g 0  . Cyanocobalamin (VITAMIN B-12 PO) Take 1 tablet by mouth daily.    Marland Kitchen estradiol (ESTRACE) 2 MG tablet Take 1 mg by mouth as needed.     . fluticasone (FLONASE) 50 MCG/ACT nasal spray INSTILL TWO SPRAYS INTO BOTH NOSTRIL DAILY. 16 g 2  . hydroxychloroquine (PLAQUENIL) 200 MG tablet Take 200 mg by mouth 2 (two) times daily.      Marland Kitchen levothyroxine (SYNTHROID) 50 MCG tablet TAKE ONE TABLET BY MOUTH EVERY MORNING BEFORE BREAKFAST 90 tablet 1  . losartan-hydrochlorothiazide (HYZAAR) 100-25 MG tablet TAKE ONE TABLET BY MOUTH DAILY 90 tablet 3  . naftifine (NAFTIN) 1 % cream Apply topically daily. 60 g 2  . naproxen sodium (ALEVE) 220 MG tablet Take 220 mg by mouth.    Marland Kitchen omeprazole (PRILOSEC) 20 MG capsule TAKE ONE CAPSULE BY MOUTH DAILY 90 capsule 1  . potassium chloride (KLOR-CON) 10 MEQ tablet TAKE TWO TABLETS BY MOUTH TWICE A DAY 120 tablet 5  . RESTASIS 0.05 % ophthalmic emulsion Place 1 drop into both eyes daily.      No current facility-administered medications on file prior to visit.     PAST MEDICAL HISTORY: Past Medical History:  Diagnosis Date  . Abdominal pain, other specified site   .  Acute upper respiratory infections of unspecified site   . Anemia   . Benign neoplasm of skin, site unspecified   . Disturbance of skin sensation   . Diverticulosis of colon (without mention of hemorrhage)   . Family history of diabetes mellitus   . Family history of ischemic heart disease   . Family history of malignant neoplasm of genital organ, other   . GERD (gastroesophageal reflux disease)   . Hypertension   . Hypokalemia   . Left shoulder pain   . Low blood potassium   . Post-menopausal   . Rheumatoid arthritis ([place])   . Sprain and strain of unspecified site of knee and leg   . Sprain of neck   . Thyroid disease   . Unspecified essential hypertension     PAST SURGICAL HISTORY: Past Surgical History:    Procedure Laterality Date  . ABDOMINAL HYSTERECTOMY    . BUNIONECTOMY  2005   right foot  . SHOULDER SURGERY     left;spurs 2-09- dr Rhoderick Moody  . TOTAL ABDOMINAL HYSTERECTOMY W/ BILATERAL SALPINGOOPHORECTOMY  2002    SOCIAL HISTORY: Social History   Tobacco Use  . Smoking status: Never Smoker  . Smokeless tobacco: Never Used  Substance Use Topics  . Alcohol use: No  . Drug use: No    FAMILY HISTORY: Family History  Problem Relation Age of Onset  . Diabetes Mother   . Hypertension Mother   . Dementia Mother   . Stroke Mother   . Stroke Maternal Aunt   . Breast cancer Maternal Aunt   . Prostate cancer Father   . Hypertension Father   . Cancer Father   . Diabetes Brother   . Hypertension Sister   . Breast cancer Cousin   . Colon cancer Neg Hx     ROS: Review of Systems  Constitutional: Positive for malaise/fatigue and weight loss.  Cardiovascular: Negative for palpitations.  Gastrointestinal: Negative for nausea and vomiting.  Musculoskeletal:       Negative for muscle weakness  Endo/Heme/Allergies:       Negative for hyperphagia Negative for heat or cold intolerance    PHYSICAL EXAM: Blood pressure 110/68, pulse 73, temperature 98.2 F (36.8 C), temperature source Oral, height 5\' 8"  (1.727 m), weight 296 lb (134.3 kg), SpO2 100 %. Body mass index is 45.01 kg/m. Physical Exam Vitals signs reviewed.  Constitutional:      Appearance: Normal appearance. She is well-developed. She is obese.  Cardiovascular:     Rate and Rhythm: Normal rate.  Pulmonary:     Effort: Pulmonary effort is normal.  Musculoskeletal: Normal range of motion.  Skin:    General: Skin is warm and dry.  Neurological:     Mental Status: She is alert and oriented to person, place, and time.  Psychiatric:        Mood and Affect: Mood normal.        Behavior: Behavior normal.     RECENT LABS AND TESTS: BMET    Component Value Date/Time   NA 142 02/21/2019 1045   K 3.9 02/21/2019  1045   CL 104 02/21/2019 1045   CO2 26 02/21/2019 1045   GLUCOSE 75 02/21/2019 1045   GLUCOSE 80 10/19/2018 0900   GLUCOSE 74 03/03/2006 1039   BUN 19 02/21/2019 1045   CREATININE 0.96 02/21/2019 1045   CALCIUM 8.9 02/21/2019 1045   GFRNONAA 65 02/21/2019 1045   GFRAA 75 02/21/2019 1045   Lab Results  Component Value Date  HGBA1C 5.4 02/21/2019   Lab Results  Component Value Date   INSULIN 8.2 02/21/2019   CBC    Component Value Date/Time   WBC 4.5 02/21/2019 1045   WBC 4.7 10/19/2018 0900   RBC 4.11 02/21/2019 1045   RBC 4.26 10/19/2018 0900   HGB 11.8 02/21/2019 1045   HCT 35.4 02/21/2019 1045   PLT 183 02/21/2019 1045   MCV 86 02/21/2019 1045   MCH 28.7 02/21/2019 1045   MCH 27.1 08/02/2011 1839   MCHC 33.3 02/21/2019 1045   MCHC 32.5 10/19/2018 0900   RDW 12.9 02/21/2019 1045   LYMPHSABS 1.3 02/21/2019 1045   MONOABS 0.4 10/19/2018 0900   EOSABS 0.3 02/21/2019 1045   BASOSABS 0.1 02/21/2019 1045   Iron/TIBC/Ferritin/ %Sat    Component Value Date/Time   IRON 63 02/21/2019 1045   TIBC 282 02/21/2019 1045   FERRITIN 45 02/21/2019 1045   IRONPCTSAT 22 02/21/2019 1045   Lipid Panel     Component Value Date/Time   CHOL 161 02/21/2019 1045   TRIG 66 02/21/2019 1045   HDL 67 02/21/2019 1045   CHOLHDL 3 10/19/2018 0900   VLDL 13.6 10/19/2018 0900   LDLCALC 81 02/21/2019 1045   LDLDIRECT 128.5 04/30/2008 0000   Hepatic Function Panel     Component Value Date/Time   PROT 6.5 02/21/2019 1045   ALBUMIN 3.7 (L) 02/21/2019 1045   AST 13 02/21/2019 1045   ALT 14 02/21/2019 1045   ALKPHOS 61 02/21/2019 1045   BILITOT 0.5 02/21/2019 1045   BILIDIR 0.1 08/08/2014 1520      Component Value Date/Time   TSH 1.850 02/21/2019 1045   TSH 3.00 10/19/2018 0900   TSH 2.61 06/15/2018 1021     Ref. Range 02/21/2019 10:45  Vitamin D, 25-Hydroxy Latest Ref Range: 30.0 - 100.0 ng/mL 44.1    OBESITY BEHAVIORAL INTERVENTION VISIT  Today's visit was # 2    Starting weight: 297 lbs Starting date: 02/21/2019 Today's weight : 296 lbs Today's date: 03/07/2019 Total lbs lost to date: 1    03/07/2019  Height 5\' 8"  (1.727 m)  Weight 296 lb (134.3 kg)  BMI (Calculated) 45.02  BLOOD PRESSURE - SYSTOLIC [id number]  BLOOD PRESSURE - DIASTOLIC 68   Body Fat % 56 %    ASK: We discussed the diagnosis of obesity with Lourena Simmonds today and Sumaiya agreed to give [place] permission to discuss obesity behavioral modification therapy today.  ASSESS: Kyden has the diagnosis of obesity and her BMI today is 45.02 Janaria is in the action stage of change   ADVISE: Sydel was educated on the multiple health risks of obesity as well as the benefit of weight loss to improve her health. She was advised of the need for long term treatment and the importance of lifestyle modifications to improve her current health and to decrease her risk of future health problems.  AGREE: Multiple dietary modification options and treatment options were discussed and  Ayodele agreed to follow the recommendations documented in the above note.  ARRANGE: Salicia was educated on the importance of frequent visits to treat obesity as outlined per CMS and USPSTF guidelines and agreed to schedule her next follow up appointment today.  Corey Skains, am acting as transcriptionist for Briscoe Deutscher, DO  I have reviewed the above documentation for accuracy and completeness, and I agree with the above. Briscoe Deutscher, DO

## 2019-03-13 ENCOUNTER — Other Ambulatory Visit (INDEPENDENT_AMBULATORY_CARE_PROVIDER_SITE_OTHER): Payer: Self-pay | Admitting: Family Medicine

## 2019-03-13 DIAGNOSIS — I10 Essential (primary) hypertension: Secondary | ICD-10-CM

## 2019-03-13 NOTE — Telephone Encounter (Signed)
Hi! I think this goes to you. Happy Thanksgiving! I hope that you are doing well! [name]

## 2019-03-13 NOTE — Telephone Encounter (Signed)
Think this should go to your office?

## 2019-03-17 IMAGING — MG 2D DIGITAL DIAGNOSTIC UNILATERAL RIGHT MAMMOGRAM WITH CAD AND AD
3 series · 3 of 7 positions shown · non-contrast
Comparison: Previous exam(s).

CLINICAL DATA: Patient recalled from screening for right breast
mass.

EXAM:
2D DIGITAL DIAGNOSTIC RIGHT MAMMOGRAM WITH CAD AND ADJUNCT TOMO
ULTRASOUND RIGHT BREAST

[R CC synth-2D]
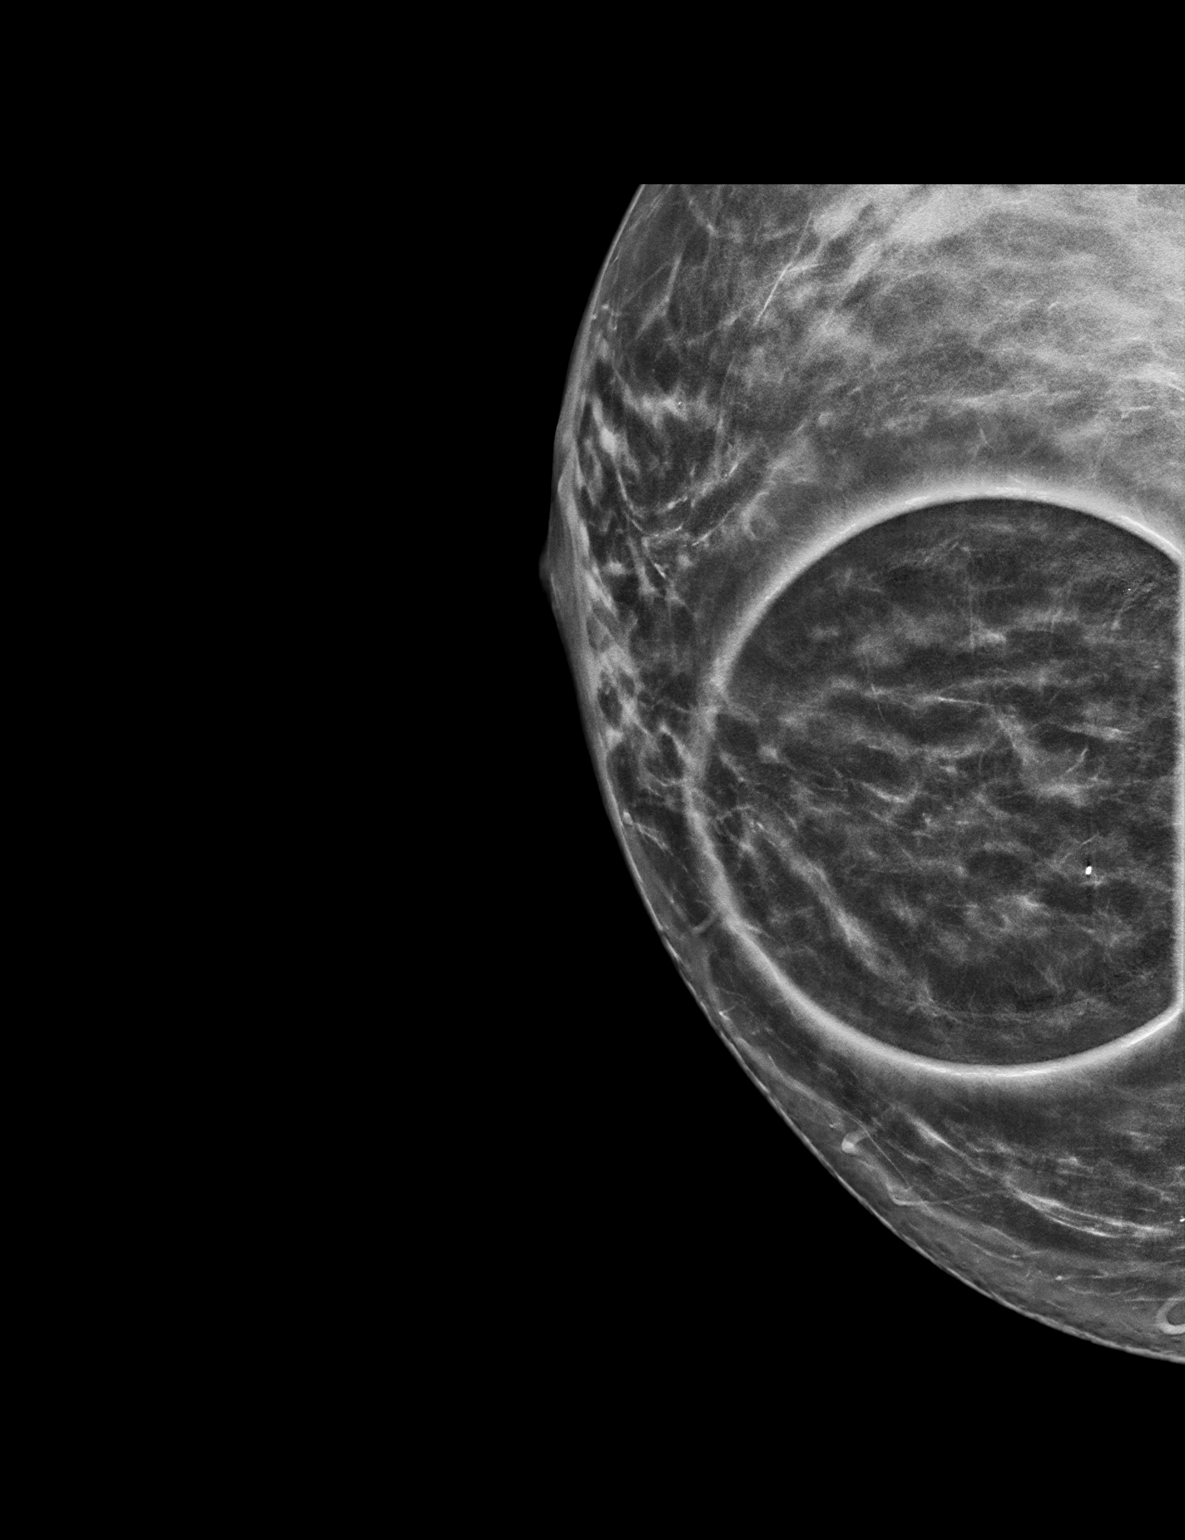

[R ML]
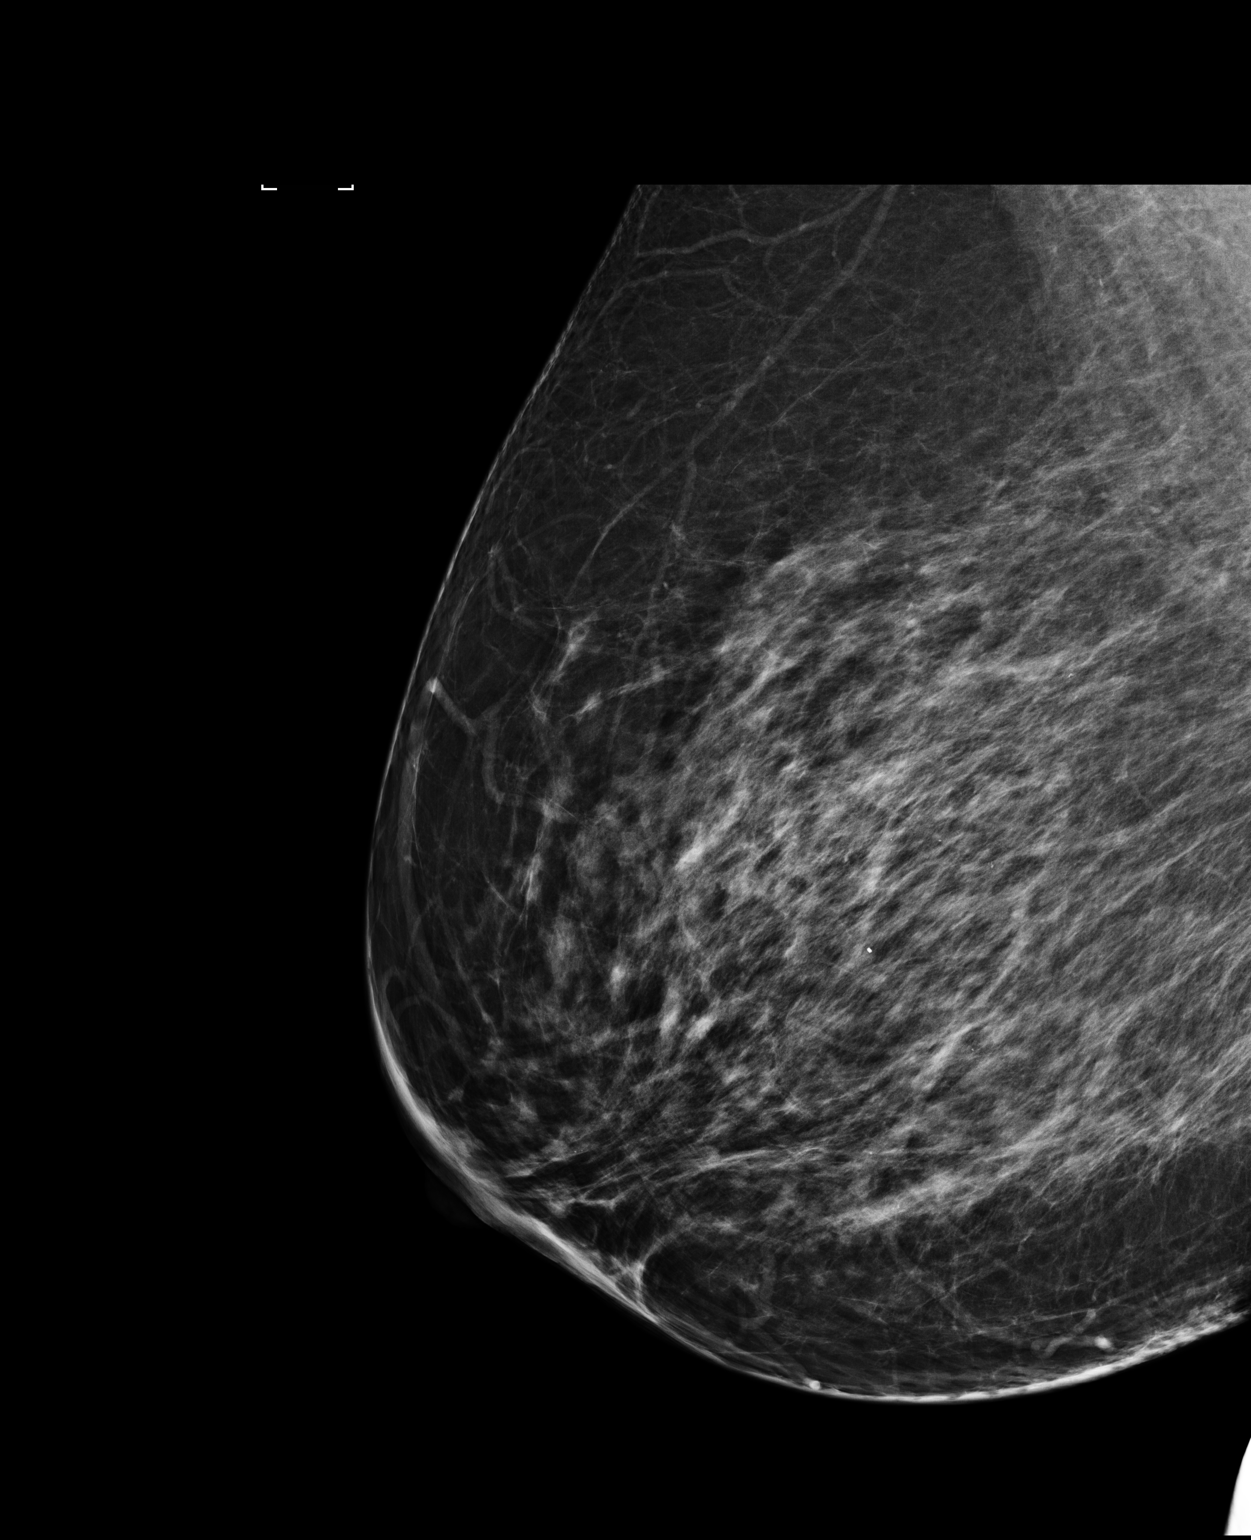

[R CC tomo · tomo slice 31/60.0]
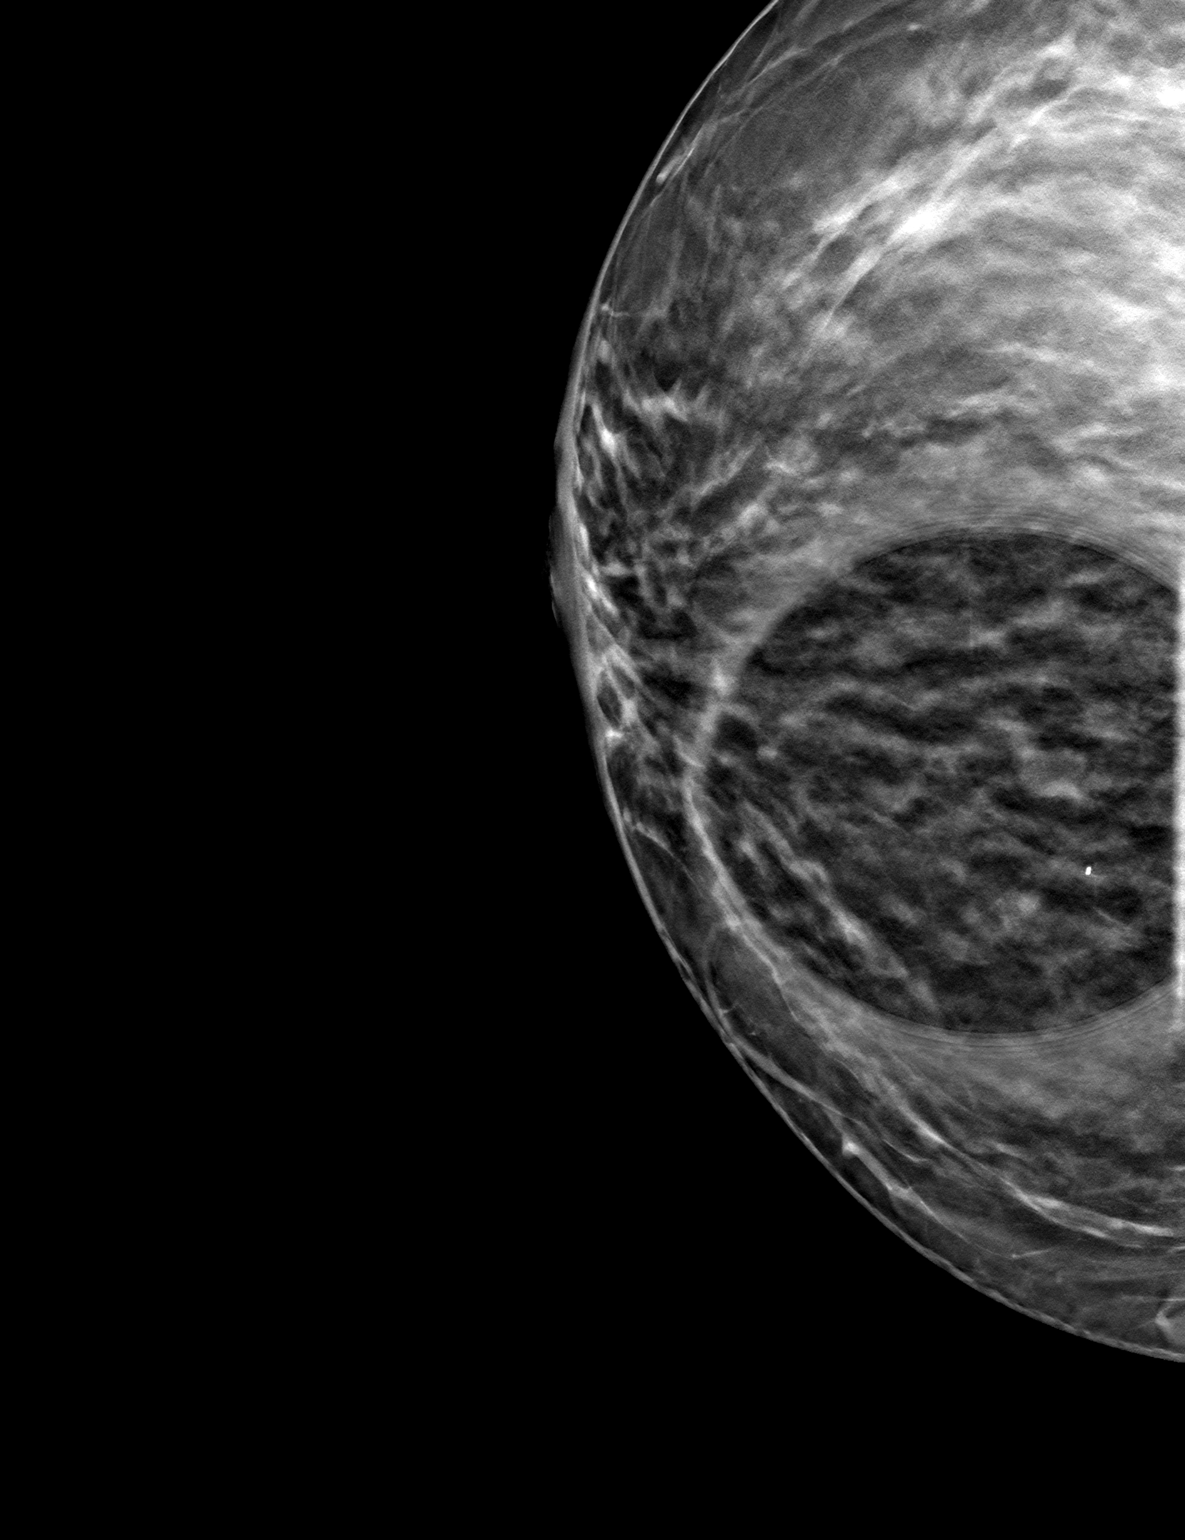

[3 of 7 positions shown; findings below may reference images not displayed]

ACR Breast Density Category c: The breast tissue is heterogeneously
dense, which may obscure small masses.
FINDINGS: Oval circumscribed low-density mass within the upper inner right
breast, further evaluated with spot CC tomosynthesis and full paddle
true lateral views of the right breast.

Mammographic images were processed with CAD.

On physical exam, I palpate no discrete mass within the upper inner
right breast.

Targeted ultrasound is performed, showing a 7 x 4 x 7 mm cyst right
breast 2 o'clock position 4 cm from the nipple.
IMPRESSION: Right breast cyst.

No mammographic evidence for malignancy.

RECOMMENDATION:
Screening mammogram in one year.(Code:[id number]-E6B)

I have discussed the findings and recommendations with the patient.
Results were also provided in writing at the conclusion of the
visit. If applicable, a reminder letter will be sent to the patient
regarding the next appointment.

BI-RADS CATEGORY  2: Benign.

## 2019-03-21 ENCOUNTER — Ambulatory Visit (INDEPENDENT_AMBULATORY_CARE_PROVIDER_SITE_OTHER): Payer: Managed Care, Other (non HMO) | Admitting: Neurology

## 2019-03-21 DIAGNOSIS — G4719 Other hypersomnia: Secondary | ICD-10-CM

## 2019-03-21 DIAGNOSIS — M170 Bilateral primary osteoarthritis of knee: Secondary | ICD-10-CM

## 2019-03-21 DIAGNOSIS — M329 Systemic lupus erythematosus, unspecified: Secondary | ICD-10-CM

## 2019-03-21 DIAGNOSIS — G4733 Obstructive sleep apnea (adult) (pediatric): Secondary | ICD-10-CM

## 2019-03-21 DIAGNOSIS — K219 Gastro-esophageal reflux disease without esophagitis: Secondary | ICD-10-CM

## 2019-03-21 DIAGNOSIS — E662 Morbid (severe) obesity with alveolar hypoventilation: Secondary | ICD-10-CM

## 2019-03-21 DIAGNOSIS — Z6841 Body Mass Index (BMI) 40.0 and over, adult: Secondary | ICD-10-CM

## 2019-03-22 ENCOUNTER — Encounter (INDEPENDENT_AMBULATORY_CARE_PROVIDER_SITE_OTHER): Payer: Self-pay

## 2019-03-28 ENCOUNTER — Encounter (INDEPENDENT_AMBULATORY_CARE_PROVIDER_SITE_OTHER): Payer: Self-pay | Admitting: Family Medicine

## 2019-03-28 ENCOUNTER — Ambulatory Visit (INDEPENDENT_AMBULATORY_CARE_PROVIDER_SITE_OTHER): Payer: Managed Care, Other (non HMO) | Admitting: Family Medicine

## 2019-03-28 ENCOUNTER — Other Ambulatory Visit: Payer: Self-pay

## 2019-03-28 VITALS — BP 109/70 | HR 75 | Temp 97.8°F | Ht 68.0 in | Wt 296.0 lb

## 2019-03-28 DIAGNOSIS — I10 Essential (primary) hypertension: Secondary | ICD-10-CM

## 2019-03-28 DIAGNOSIS — E8881 Metabolic syndrome: Secondary | ICD-10-CM

## 2019-03-28 DIAGNOSIS — Z6841 Body Mass Index (BMI) 40.0 and over, adult: Secondary | ICD-10-CM

## 2019-03-28 DIAGNOSIS — E559 Vitamin D deficiency, unspecified: Secondary | ICD-10-CM

## 2019-03-28 DIAGNOSIS — E038 Other specified hypothyroidism: Secondary | ICD-10-CM

## 2019-03-28 DIAGNOSIS — Z9189 Other specified personal risk factors, not elsewhere classified: Secondary | ICD-10-CM | POA: Diagnosis not present

## 2019-03-29 NOTE — Progress Notes (Deleted)
Office: [phone]  /  Fax: [phone]   HPI:   Chief Complaint: OBESITY [name] is here to discuss her progress with her obesity treatment plan. She is on the  {MWMwtlossportion/plan#2:210800006} and is following her eating plan approximately *** % of the time. She states she is exercising *** minutes *** times per week. [name] is currently struggling with ***.  Her weight is 296 lb (134.3 kg) today and {misc; weight loss:12477} since her last visit. She has lost *** lbs since starting treatment with [place].  Vitamin D Deficiency [name] has a diagnosis of vitamin D deficiency. She {ACTION; IS/IS [id number] currently taking vit D and denies nausea, vomiting or muscle weakness.  At risk for osteopenia and osteoporosis [name] is at higher risk of osteopenia and osteoporosis due to vitamin D deficiency.   Insulin Resistance [name] has a diagnosis of insulin resistance based on her elevated fasting insulin level >5. Although [name]'s blood glucose readings are still under good control, insulin resistance puts her at greater risk of metabolic syndrome and diabetes. She {ACTION; IS/IS [id number] taking metformin currently and continues to work on diet and exercise to decrease risk of diabetes.  Hypothyroidism [name] has a diagnosis of hypothyroidism. She {Misc; is/is not/not currently/has never been:13135} on levothyroxine. She denies hot or cold intolerance or palpitations, but does admit to ongoing fatigue.  Hypertension [name] is a 58 y.o. female with hypertension. [name] denies chest pain or shortness of breath on exertion. She is working on weight loss to help control her blood pressure with the goal of decreasing her risk of heart attack and stroke. Reginas blood pressure {is/is not:19887} currently controlled.  ASSESSMENT AND PLAN:  Insulin resistance  Vitamin D deficiency  Essential hypertension  Other specified hypothyroidism  At risk for osteoporosis   Class 3 severe obesity with serious comorbidity and body mass index (BMI) of 45.0 to 49.9 in adult, unspecified obesity type (HCC)  PLAN:  Vitamin D Deficiency Low vitamin D level contributes to fatigue and are associated with obesity, breast, and colon cancer. She agrees to continue to take prescription Vit D 50,000 IU every week and will follow up for routine testing of vitamin D, at least 2-3 times per year to avoid over-replacement.  At risk for osteopenia and osteoporosis [name] was given extended (15 minutes) osteoporosis prevention counseling today. Bleu is at risk for osteopenia and osteoporsis due to her vitamin D deficiency. She was encouraged to take her vitamin D and follow her higher calcium diet and increase strengthening exercise to help strengthen her bones and decrease her risk of osteopenia and osteoporosis.  Insulin Resistance Korena will continue to work on weight loss, exercise, and decreasing simple carbohydrates to help decrease the risk of diabetes. Darlean agreed to follow up with [place] as directed to closely monitor her progress.  Hypothyroidism [name] was informed of the importance of good thyroid control for overall health and that supertheraputic thyroid levels are dangerous and will not improve weight loss results. Will continue to monitor.  Hypertension [name] is working on healthy weight loss and exercise to improve blood pressure control. We will watch for signs of hypotension as she continues her lifestyle modifications.  Obesity [name] {CHL AMB IS/IS NOT:210130109} currently in the action stage of change. As such, her goal is to {MWMwtloss#1:210800005} She has agreed to {MWMwtlossportion/plan#2:210800006} [name] has been instructed to work up to a goal of 150 minutes of combined cardio and strengthening exercise per week for weight loss and overall health  benefits. We discussed the following Behavioral Modification Strategies today:  {MWMwtlossdietstrategies#3:210800007}   [name] has agreed to follow up with our clinic in {NUMBER 1-10:22536} weeks. She was informed of the importance of frequent follow up visits to maximize her success with intensive lifestyle modifications for her multiple health conditions.  ALLERGIES: No Known Allergies  MEDICATIONS: Current Outpatient Medications on File Prior to Visit  Medication Sig Dispense Refill  . amLODipine (NORVASC) 10 MG tablet TAKE ONE TABLET BY MOUTH DAILY 90 tablet 1  . clotrimazole-betamethasone (LOTRISONE) cream Apply 1 application topically 2 (two) times daily. 30 g 0  . Cyanocobalamin (VITAMIN B-12 PO) Take 1 tablet by mouth daily.    Marland Kitchen estradiol (ESTRACE) 2 MG tablet Take 1 mg by mouth as needed.     . fluticasone (FLONASE) 50 MCG/ACT nasal spray INSTILL TWO SPRAYS INTO BOTH NOSTRIL DAILY. 16 g 2  . hydrochlorothiazide (HYDRODIURIL) 25 MG tablet TAKE ONE TABLET BY MOUTH DAILY 30 tablet 0  . hydroxychloroquine (PLAQUENIL) 200 MG tablet Take 200 mg by mouth 2 (two) times daily.      Marland Kitchen levothyroxine (SYNTHROID) 50 MCG tablet TAKE ONE TABLET BY MOUTH EVERY MORNING BEFORE BREAKFAST 90 tablet 1  . losartan (COZAAR) 100 MG tablet TAKE ONE TABLET BY MOUTH DAILY 30 tablet 0  . naftifine (NAFTIN) 1 % cream Apply topically daily. 60 g 2  . naproxen sodium (ALEVE) 220 MG tablet Take 220 mg by mouth.    Marland Kitchen omeprazole (PRILOSEC) 20 MG capsule TAKE ONE CAPSULE BY MOUTH DAILY 90 capsule 1  . potassium chloride (KLOR-CON) 10 MEQ tablet TAKE TWO TABLETS BY MOUTH TWICE A DAY 120 tablet 5  . RESTASIS 0.05 % ophthalmic emulsion Place 1 drop into both eyes daily.      No current facility-administered medications on file prior to visit.    PAST MEDICAL HISTORY: Past Medical History:  Diagnosis Date  . Abdominal pain, other specified site   . Acute upper respiratory infections of unspecified site   . Anemia   . Benign neoplasm of skin, site unspecified   . Disturbance of skin  sensation   . Diverticulosis of colon (without mention of hemorrhage)   . Family history of diabetes mellitus   . Family history of ischemic heart disease   . Family history of malignant neoplasm of genital organ, other   . GERD (gastroesophageal reflux disease)   . Hypertension   . Hypokalemia   . Left shoulder pain   . Low blood potassium   . Post-menopausal   . Rheumatoid arthritis ([place])   . Sprain and strain of unspecified site of knee and leg   . Sprain of neck   . Thyroid disease   . Unspecified essential hypertension     PAST SURGICAL HISTORY: Past Surgical History:  Procedure Laterality Date  . ABDOMINAL HYSTERECTOMY    . BUNIONECTOMY  2005   right foot  . SHOULDER SURGERY     left;spurs 2-09- dr Rhoderick Moody  . TOTAL ABDOMINAL HYSTERECTOMY W/ BILATERAL SALPINGOOPHORECTOMY  2002    SOCIAL HISTORY: Social History   Tobacco Use  . Smoking status: Never Smoker  . Smokeless tobacco: Never Used  Substance Use Topics  . Alcohol use: No  . Drug use: No    FAMILY HISTORY: Family History  Problem Relation Age of Onset  . Diabetes Mother   . Hypertension Mother   . Dementia Mother   . Stroke Mother   . Stroke Maternal Aunt   . Breast cancer  Maternal Aunt   . Prostate cancer Father   . Hypertension Father   . Cancer Father   . Diabetes Brother   . Hypertension Sister   . Breast cancer Cousin   . Colon cancer Neg Hx     ROS: Review of Systems  Constitutional: Negative for weight loss.  Cardiovascular: Negative for palpitations.  Gastrointestinal: Negative for nausea and vomiting.  Musculoskeletal:       Negative muscle weakness  Endo/Heme/Allergies:       Negative hot/cold intolerance    PHYSICAL EXAM: Blood pressure 109/70, pulse 75, temperature 97.8 F (36.6 C), temperature source Oral, height 5\' 8"  (1.727 m), weight 296 lb (134.3 kg), SpO2 99 %. Body mass index is 45.01 kg/m. Physical Exam Vitals reviewed.  Constitutional:      Appearance: Normal  appearance. She is obese.  Cardiovascular:     Rate and Rhythm: Normal rate.     Pulses: Normal pulses.  Pulmonary:     Effort: Pulmonary effort is normal.     Breath sounds: Normal breath sounds.  Musculoskeletal:        General: Normal range of motion.  Skin:    General: Skin is warm and dry.  Neurological:     Mental Status: She is alert and oriented to person, place, and time.  Psychiatric:        Mood and Affect: Mood normal.        Behavior: Behavior normal.     RECENT LABS AND TESTS: BMET    Component Value Date/Time   NA 142 02/21/2019 1045   K 3.9 02/21/2019 1045   CL 104 02/21/2019 1045   CO2 26 02/21/2019 1045   GLUCOSE 75 02/21/2019 1045   GLUCOSE 80 10/19/2018 0900   GLUCOSE 74 03/03/2006 1039   BUN 19 02/21/2019 1045   CREATININE 0.96 02/21/2019 1045   CALCIUM 8.9 02/21/2019 1045   GFRNONAA 65 02/21/2019 1045   GFRAA 75 02/21/2019 1045   Lab Results  Component Value Date   HGBA1C 5.4 02/21/2019   Lab Results  Component Value Date   INSULIN 8.2 02/21/2019   CBC    Component Value Date/Time   WBC 4.5 02/21/2019 1045   WBC 4.7 10/19/2018 0900   RBC 4.11 02/21/2019 1045   RBC 4.26 10/19/2018 0900   HGB 11.8 02/21/2019 1045   HCT 35.4 02/21/2019 1045   PLT 183 02/21/2019 1045   MCV 86 02/21/2019 1045   MCH 28.7 02/21/2019 1045   MCH 27.1 08/02/2011 1839   MCHC 33.3 02/21/2019 1045   MCHC 32.5 10/19/2018 0900   RDW 12.9 02/21/2019 1045   LYMPHSABS 1.3 02/21/2019 1045   MONOABS 0.4 10/19/2018 0900   EOSABS 0.3 02/21/2019 1045   BASOSABS 0.1 02/21/2019 1045   Iron/TIBC/Ferritin/ %Sat    Component Value Date/Time   IRON 63 02/21/2019 1045   TIBC 282 02/21/2019 1045   FERRITIN 45 02/21/2019 1045   IRONPCTSAT 22 02/21/2019 1045   Lipid Panel     Component Value Date/Time   CHOL 161 02/21/2019 1045   TRIG 66 02/21/2019 1045   HDL 67 02/21/2019 1045   CHOLHDL 3 10/19/2018 0900   VLDL 13.6 10/19/2018 0900   LDLCALC 81 02/21/2019 1045    LDLDIRECT 128.5 04/30/2008 0000   Hepatic Function Panel     Component Value Date/Time   PROT 6.5 02/21/2019 1045   ALBUMIN 3.7 (L) 02/21/2019 1045   AST 13 02/21/2019 1045   ALT 14 02/21/2019 1045  ALKPHOS 61 02/21/2019 1045   BILITOT 0.5 02/21/2019 1045   BILIDIR 0.1 08/08/2014 1520      Component Value Date/Time   TSH 1.850 02/21/2019 1045   TSH 3.00 10/19/2018 0900   TSH 2.61 06/15/2018 1021      OBESITY BEHAVIORAL INTERVENTION VISIT  Today's visit was # {Numbers; 0-21:14529}   Starting weight: *** Starting date: *** Today's weight : 296 lbs Today's date: 03/28/2019 Total lbs lost to date: ***    ASK: We discussed the diagnosis of obesity with Lourena Simmonds today and Rollene Fare agreed to give [place] permission to discuss obesity behavioral modification therapy today.  ASSESS: Rolande has the diagnosis of obesity and her BMI today is @TBMI @ [place] {ACTION; IS/IS [id number] in the action stage of change   ADVISE: Rickia was educated on the multiple health risks of obesity as well as the benefit of weight loss to improve her health. She was advised of the need for long term treatment and the importance of lifestyle modifications to improve her current health and to decrease her risk of future health problems.  AGREE: Multiple dietary modification options and treatment options were discussed and  Diamante agreed to follow the recommendations documented in the above note.  ARRANGE: Vale was educated on the importance of frequent visits to treat obesity as outlined per CMS and USPSTF guidelines and agreed to schedule her next follow up appointment today.  I, ***, am acting as transcriptionist for Briscoe Deutscher, DO

## 2019-03-29 NOTE — Progress Notes (Signed)
Office: [phone]  /  Fax: [phone]   HPI:  Chief Complaint: OBESITY [name] is here to discuss her progress with her obesity treatment plan. She is on the Category 2 plan and states she is following her eating plan approximately 50 % of the time. She states she is walking for 30 minutes 5 times per week.  [name] had a sleep study on December 2nd, but no results yet. She found a high protein oatmeal. She wants to start having pizza if possible.  Vitamin D Deficiency [name] is taking Vit D replacement.  At risk for osteopenia and osteoporosis [name] is at higher risk of osteopenia and osteoporosis due to vitamin D deficiency.   Insulin Resistance [name] is not on medications currently.  Hypothyroidism [name] is taking levothyroxine and is feeling euthyroid.  Hypertension [name] is taking hydrochlorothiazide in the morning and Losartan at night. She notes its helps fatigue. Her systolic blood pressure was 109 today.   Today's visit was # 3  Starting weight: 297 lbs Starting date: 02/21/2019 Today's weight : 296 lbs Today's date: 03/28/2019 Total lbs lost to date: 1 Total lbs lost since last in-office visit: 0  ASSESSMENT AND PLAN:  Insulin resistance  Vitamin D deficiency  Essential hypertension  Other specified hypothyroidism  At risk for osteoporosis  Class 3 severe obesity with serious comorbidity and body mass index (BMI) of 45.0 to 49.9 in adult, unspecified obesity type ([place])  PLAN:  Vitamin D Deficiency Low vitamin D level contributes to fatigue and are associated with obesity, breast, and colon cancer. [name] agrees to continue taking Vit D replacement and will follow up for routine testing of vitamin D, at least 2-3 times per year to avoid over-replacement. [name] agrees to follow up with our clinic in 2 weeks.  At risk for osteopenia and osteoporosis [name] was given extended (15 minutes) osteoporosis prevention counseling today. [name] is at risk  for osteopenia and osteoporsis due to her vitamin D deficiency. She was encouraged to take her vitamin D and follow her higher calcium diet and increase strengthening exercise to help strengthen her bones and decrease her risk of osteopenia and osteoporosis.  Insulin Resistance [name] will continue to work on weight loss, exercise, and decreasing simple carbohydrates to help decrease the risk of diabetes. [name] agrees to follow up with [place] as directed to closely monitor her progress.  Hypothyroidism [name] was informed of the importance of good thyroid control for overall health and that supertheraputic thyroid levels are dangerous and will not improve weight loss results. Will continue to monitor.  Hypertension [name] is working on healthy weight loss and exercise to improve blood pressure control. We will watch for signs of hypotension as she continues her lifestyle modifications. [name] was instructed to monitor and decrease to 1/2 tablet of Losartan if blood pressure is dropping or orthostatic.   Obesity [name] is currently in the action stage of change. As such, her goal is to continue with weight loss efforts She has agreed to follow the Category 2 plan [name] has been instructed to work up to a goal of 150 minutes of combined cardio and strengthening exercise per week for weight loss and overall health benefits. We discussed the following Behavioral Modification Strategies today: increasing lean protein intake, decreasing simple carbohydrates, increasing vegetables, increase H20 intake, keeping healthy foods in the home, holiday eating strategies, decrease ETOH and decrease liquid calories.  [name] has agreed to follow up with our clinic in 2 weeks. She was informed of the  importance of frequent follow up visits to maximize her success with intensive lifestyle modifications for her multiple health conditions.  ALLERGIES: No Known Allergies  MEDICATIONS: Current Outpatient Medications  on File Prior to Visit  Medication Sig Dispense Refill  . amLODipine (NORVASC) 10 MG tablet TAKE ONE TABLET BY MOUTH DAILY 90 tablet 1  . clotrimazole-betamethasone (LOTRISONE) cream Apply 1 application topically 2 (two) times daily. 30 g 0  . Cyanocobalamin (VITAMIN B-12 PO) Take 1 tablet by mouth daily.    Marland Kitchen estradiol (ESTRACE) 2 MG tablet Take 1 mg by mouth as needed.     . fluticasone (FLONASE) 50 MCG/ACT nasal spray INSTILL TWO SPRAYS INTO BOTH NOSTRIL DAILY. 16 g 2  . hydrochlorothiazide (HYDRODIURIL) 25 MG tablet TAKE ONE TABLET BY MOUTH DAILY 30 tablet 0  . hydroxychloroquine (PLAQUENIL) 200 MG tablet Take 200 mg by mouth 2 (two) times daily.      Marland Kitchen levothyroxine (SYNTHROID) 50 MCG tablet TAKE ONE TABLET BY MOUTH EVERY MORNING BEFORE BREAKFAST 90 tablet 1  . losartan (COZAAR) 100 MG tablet TAKE ONE TABLET BY MOUTH DAILY 30 tablet 0  . naftifine (NAFTIN) 1 % cream Apply topically daily. 60 g 2  . naproxen sodium (ALEVE) 220 MG tablet Take 220 mg by mouth.    Marland Kitchen omeprazole (PRILOSEC) 20 MG capsule TAKE ONE CAPSULE BY MOUTH DAILY 90 capsule 1  . potassium chloride (KLOR-CON) 10 MEQ tablet TAKE TWO TABLETS BY MOUTH TWICE A DAY 120 tablet 5  . RESTASIS 0.05 % ophthalmic emulsion Place 1 drop into both eyes daily.      No current facility-administered medications on file prior to visit.   PAST MEDICAL HISTORY: Past Medical History:  Diagnosis Date  . Abdominal pain, other specified site   . Acute upper respiratory infections of unspecified site   . Anemia   . Benign neoplasm of skin, site unspecified   . Disturbance of skin sensation   . Diverticulosis of colon (without mention of hemorrhage)   . Family history of diabetes mellitus   . Family history of ischemic heart disease   . Family history of malignant neoplasm of genital organ, other   . GERD (gastroesophageal reflux disease)   . Hypertension   . Hypokalemia   . Left shoulder pain   . Low blood potassium   . Post-menopausal    . Rheumatoid arthritis ([place])   . Sprain and strain of unspecified site of knee and leg   . Sprain of neck   . Thyroid disease   . Unspecified essential hypertension    PAST SURGICAL HISTORY: Past Surgical History:  Procedure Laterality Date  . ABDOMINAL HYSTERECTOMY    . BUNIONECTOMY  2005   right foot  . SHOULDER SURGERY     left;spurs 2-09- dr Rhoderick Moody  . TOTAL ABDOMINAL HYSTERECTOMY W/ BILATERAL SALPINGOOPHORECTOMY  2002   SOCIAL HISTORY: Social History   Tobacco Use  . Smoking status: Never Smoker  . Smokeless tobacco: Never Used  Substance Use Topics  . Alcohol use: No  . Drug use: No   FAMILY HISTORY: Family History  Problem Relation Age of Onset  . Diabetes Mother   . Hypertension Mother   . Dementia Mother   . Stroke Mother   . Stroke Maternal Aunt   . Breast cancer Maternal Aunt   . Prostate cancer Father   . Hypertension Father   . Cancer Father   . Diabetes Brother   . Hypertension Sister   . Breast cancer Cousin   .  Colon cancer Neg Hx    ROS: Review of Systems  Constitutional: Negative for weight loss.   PHYSICAL EXAM: Blood pressure 109/70, pulse 75, temperature 97.8 F (36.6 C), temperature source Oral, height 5\' 8"  (1.727 m), weight 296 lb (134.3 kg), SpO2 99 %. Body mass index is 45.01 kg/m.   Physical Exam Vitals reviewed.  Constitutional:      Appearance: Normal appearance. She is obese.  Cardiovascular:     Rate and Rhythm: Normal rate.     Pulses: Normal pulses.  Pulmonary:     Effort: Pulmonary effort is normal.     Breath sounds: Normal breath sounds.  Musculoskeletal:        General: Normal range of motion.  Skin:    General: Skin is warm and dry.  Neurological:     Mental Status: She is alert and oriented to person, place, and time.  Psychiatric:        Mood and Affect: Mood normal.        Behavior: Behavior normal.    RECENT LABS AND TESTS: BMET    Component Value Date/Time   NA 142 02/21/2019 1045   K 3.9  02/21/2019 1045   CL 104 02/21/2019 1045   CO2 26 02/21/2019 1045   GLUCOSE 75 02/21/2019 1045   GLUCOSE 80 10/19/2018 0900   GLUCOSE 74 03/03/2006 1039   BUN 19 02/21/2019 1045   CREATININE 0.96 02/21/2019 1045   CALCIUM 8.9 02/21/2019 1045   GFRNONAA 65 02/21/2019 1045   GFRAA 75 02/21/2019 1045   Lab Results  Component Value Date   HGBA1C 5.4 02/21/2019   Lab Results  Component Value Date   INSULIN 8.2 02/21/2019   CBC    Component Value Date/Time   WBC 4.5 02/21/2019 1045   WBC 4.7 10/19/2018 0900   RBC 4.11 02/21/2019 1045   RBC 4.26 10/19/2018 0900   HGB 11.8 02/21/2019 1045   HCT 35.4 02/21/2019 1045   PLT 183 02/21/2019 1045   MCV 86 02/21/2019 1045   MCH 28.7 02/21/2019 1045   MCH 27.1 08/02/2011 1839   MCHC 33.3 02/21/2019 1045   MCHC 32.5 10/19/2018 0900   RDW 12.9 02/21/2019 1045   LYMPHSABS 1.3 02/21/2019 1045   MONOABS 0.4 10/19/2018 0900   EOSABS 0.3 02/21/2019 1045   BASOSABS 0.1 02/21/2019 1045   Iron/TIBC/Ferritin/ %Sat    Component Value Date/Time   IRON 63 02/21/2019 1045   TIBC 282 02/21/2019 1045   FERRITIN 45 02/21/2019 1045   IRONPCTSAT 22 02/21/2019 1045   Lipid Panel     Component Value Date/Time   CHOL 161 02/21/2019 1045   TRIG 66 02/21/2019 1045   HDL 67 02/21/2019 1045   CHOLHDL 3 10/19/2018 0900   VLDL 13.6 10/19/2018 0900   LDLCALC 81 02/21/2019 1045   LDLDIRECT 128.5 04/30/2008 0000   Hepatic Function Panel     Component Value Date/Time   PROT 6.5 02/21/2019 1045   ALBUMIN 3.7 (L) 02/21/2019 1045   AST 13 02/21/2019 1045   ALT 14 02/21/2019 1045   ALKPHOS 61 02/21/2019 1045   BILITOT 0.5 02/21/2019 1045   BILIDIR 0.1 08/08/2014 1520      Component Value Date/Time   TSH 1.850 02/21/2019 1045   TSH 3.00 10/19/2018 0900   TSH 2.61 06/15/2018 1021   I, Trixie Dredge, am acting as Location manager for Briscoe Deutscher, DO  I have reviewed the above documentation for accuracy and completeness, and I agree with the  above. -  Briscoe Deutscher, DO

## 2019-04-03 ENCOUNTER — Telehealth: Payer: Self-pay | Admitting: Neurology

## 2019-04-03 NOTE — Addendum Note (Signed)
Addended by: Larey Seat on: 04/03/2019 12:24 PM   Modules accepted: Orders

## 2019-04-03 NOTE — Telephone Encounter (Signed)
-----   Message from Larey Seat, MD sent at 04/03/2019 12:24 PM EST ----- Positive for OSA- Overall degree of obstructive sleep apnea was  mild at AHI 8.6/h but strongly accentuated in REM sleep to AHI of  24.7/h. RDI indicated presence of at least moderate snoring. The  patient did not have low oxygen levels for a prolonged time in  sleep and her heart rate varied within normal range.  Recommendations:     Only CPAP will treat REM dependent sleep apnea, as dental devices  usually fail.  I like for the patient to start on CPAP at 5-15 cm water with 2  cm EPR heated humidity and mask of choice.  Treatment of OSA by CPAP can help to correct metabolic disorders,  and support weight loss efforts.   Interpreting Physician: Larey Seat, MD

## 2019-04-03 NOTE — Procedures (Signed)
Patient Information     First Name: [name]. Last Name: Darien Standridge: [id number]  Birth Date: 04-Oct-1960 Age: 58 Gender: Female  Referred Provider: Carollee Herter, Alferd Apa, DO BMI: 46.1 (W=304 lb, H=5' 8'')  Neck Circ.:  16 '' Epworth:  14/24   Sleep Study Information    Study Date: Mar 21, 2019 S/H/A Version: 001.001.001.001 / 4.1.1528 / 67  History:    I have the pleasure of seeing [name], a right-handed African -American female referred [facility] for evaluation of possible OSA.  She has a medical history of Abdominal pain, upper respiratory infections, Anemia, Benign neoplasm of skin, site unspecified, morbid obesity/ BMI 46. Disturbance of skin sensation, Diverticulosis of colon (without mention of hemorrhage), GERD (gastroesophageal reflux disease), Hypertension, Hypokalemia, Post-menopausal status, Rheumatoid arthritis ([place]), Thyroid disease, and essential hypertension. The patient has undergone metabolic  testing at the [facility] under Dr.Beasley's team.    Summary & Diagnosis:    Positive for OSA- Overall degree of obstructive sleep apnea was mild at AHI 8.6/h but strongly accentuated in REM sleep to AHI of 24.7/h. RDI indicated presence of at least moderate snoring. The patient did not have low oxygen levels for a prolonged time in sleep and her heart rate varied within normal range.  Recommendations:      Only CPAP will treat REM dependent sleep apnea, as dental devices usually fail. I like for the patient to start on CPAP at 5-15 cm water with 2 cm EPR heated humidity and mask of choice. Treatment of OSA by CPAP can help to correct metabolic disorders, and support weight loss efforts.    Interpreting Physician: Larey Seat, MD             Sleep Summary  Oxygen Saturation Statistics   Start Study Time: End Study Time: Total Recording Time:  9:14:58 PM 7:18:59 AM 10 h, 4 min  Total Sleep Time % REM of Sleep Time:  8 h, 20  min  21.4    Mean: 95 Minimum: 88 Maximum: 99  Mean of Desaturations Nadirs (%):   91  Oxygen Desaturation. %: 4-9 10-20 >20 Total  Events Number Total  24 100.0  0 0.0  0 0.0  24 100.0  Oxygen Saturation: <90 <=88 <85 <80 <70  Duration (minutes): Sleep % 0.5 0.2 0.1 0.0 0.0 0.0 0.0 0.0 0.0 0.0     Respiratory Indices      Total Events REM NREM All Night  pRDI:  91  pAHI:  70 ODI:  24  pAHIc:  0  % CSR: 0.0 27.0 24.7 11.5 0.0 6.9 4.2 0.6 0.0 11.2 8.6 3.0 0.0       Pulse Rate Statistics during Sleep (BPM)      Mean: 71 Minimum: 57 Maximum: 123    Indices are calculated using technically valid sleep time of  8 h, 6 min. Central-Indices are calculated using technically valid sleep time of  8  hrs, 2 min. pRDI/pAHI are calculated using oxi desaturations ? 3%  Body Position Statistics  Position Supine Prone Right Left Non-Supine  Sleep (min) 160.0 0.0 295.0 45.0 340.0  Sleep % 32.0 0.0 59.0 9.0 68.0  pRDI 19.6 N/A 7.2 9.5 7.5  pAHI 16.4 N/A 5.5 2.7 5.2  ODI 7.2 N/A 1.2 0.0 1.1     Snoring Statistics Snoring Level (dB) >40 >50 >60 >70 >80 >Threshold (45)  Sleep (min) 142.2 32.7 6.3 0.0 0.0 53.6  Sleep % 28.4 6.5 1.3 0.0 0.0 10.7    Mean: 42 dB Sleep Stages Chart              pAHI=8.6

## 2019-04-03 NOTE — Progress Notes (Signed)
Positive for OSA- Overall degree of obstructive sleep apnea was  mild at AHI 8.6/h but strongly accentuated in REM sleep to AHI of  24.7/h. RDI indicated presence of at least moderate snoring. The  patient did not have low oxygen levels for a prolonged time in  sleep and her heart rate varied within normal range.  Recommendations:     Only CPAP will treat REM dependent sleep apnea, as dental devices  usually fail.  I like for the patient to start on CPAP at 5-15 cm water with 2  cm EPR heated humidity and mask of choice.  Treatment of OSA by CPAP can help to correct metabolic disorders,  and support weight loss efforts.   Interpreting Physician: Larey Seat, MD

## 2019-04-03 NOTE — Telephone Encounter (Signed)
I called pt. I advised pt that Dr. Brett Fairy reviewed their sleep study results and found that pt has sleep apnea. Dr. Brett Fairy recommends that pt starts auto CPAP. I reviewed PAP compliance expectations with the pt. Pt is agreeable to starting a CPAP. I advised pt that an order will be sent to a DME, [facility], and [facility] will call the pt within about one week after they file with the pt's insurance. [facility] will show the pt how to use the machine, fit for masks, and troubleshoot the CPAP if needed. A follow up appt was made for insurance purposes with Ward Givens, NP on Mar 4,2021 at 11:30 am. Pt verbalized understanding to arrive 15 minutes early and bring their CPAP. A letter with all of this information in it will be mailed to the pt as a reminder. I verified with the pt that the address we have on file is correct. Pt verbalized understanding of results. Pt had no questions at this time but was encouraged to call back if questions arise. I have sent the order to [facility] and have received confirmation that they have received the order.

## 2019-04-11 ENCOUNTER — Other Ambulatory Visit: Payer: Self-pay

## 2019-04-11 ENCOUNTER — Encounter (INDEPENDENT_AMBULATORY_CARE_PROVIDER_SITE_OTHER): Payer: Self-pay | Admitting: Bariatrics

## 2019-04-11 ENCOUNTER — Ambulatory Visit (INDEPENDENT_AMBULATORY_CARE_PROVIDER_SITE_OTHER): Payer: Managed Care, Other (non HMO) | Admitting: Bariatrics

## 2019-04-11 VITALS — BP 102/69 | HR 71 | Temp 98.0°F | Ht 68.0 in | Wt 300.0 lb

## 2019-04-11 DIAGNOSIS — I10 Essential (primary) hypertension: Secondary | ICD-10-CM

## 2019-04-11 DIAGNOSIS — R600 Localized edema: Secondary | ICD-10-CM

## 2019-04-11 DIAGNOSIS — Z6841 Body Mass Index (BMI) 40.0 and over, adult: Secondary | ICD-10-CM

## 2019-04-11 DIAGNOSIS — Z9189 Other specified personal risk factors, not elsewhere classified: Secondary | ICD-10-CM

## 2019-04-11 MED ORDER — FUROSEMIDE 40 MG PO TABS: ORAL_TABLET | ORAL | 0 refills | Status: DC

## 2019-04-11 NOTE — Progress Notes (Signed)
Office: [phone]  /  Fax: [phone]   HPI:  Chief Complaint: OBESITY [name] is here to discuss her progress with her obesity treatment plan. She is on the Category 2 plan and states she is following her eating plan approximately 80% of the time. She states she is walking 30 minutes 5 times per week.  [name] is up 4 lbs. She is doing well with her water intake and reports getting protein with each meal. She will start the Ileene Musa on 04/23/2019. She will increase her water intake to up to 64 ounces.  Today's visit was #4  Starting weight: 297 lbs Starting date: 02/21/2019 Today's weight: 300 lbs Today's date: 04/11/2019 Total lbs lost to date: 0 Total lbs lost since last in-office visit: 0   Hypertension [name] has a diagnosis of hypertension and is taking Norvasc and hydrochlorothiazide. Blood pressure today is 102/69.  At risk for cardiovascular disease [name] is at a higher than average risk for cardiovascular disease due to obesity. She currently denies any chest pain.  Edema, Lower Extremities [name] has edema in her lower extremities. She denies shortness of breath or chest pain.  ASSESSMENT AND PLAN:  Essential hypertension  Lower extremity edema - Plan: B Nat Peptide  At risk for heart disease  Class 3 severe obesity with serious comorbidity and body mass index (BMI) of 45.0 to 49.9 in adult, unspecified obesity type ([place])  PLAN:  Hypertension [name] is working on healthy weight loss and exercise to improve blood pressure control. She will continue her medications. We will watch for signs of hypotension as she continues her lifestyle modifications.  Cardiovascular risk counseling [name] was given (~15 minutes) coronary artery disease prevention counseling today. She is 58 y.o. female and has risk factors for heart disease including obesity. We discussed intensive lifestyle modifications today with an emphasis on specific weight loss instructions and  strategies.   Edema, Lower Extremities [name] will have BNP checked. She will not add salt to her diet and decrease canned food. If she does eat canned food, she was instructed to rinse it first. She was given a prescription for Lasix 40 mg 1 every 3 days if needed #30 with 0 refills and she agrees to follow-up with our clinic in 4 weeks.   Obesity [name] is currently in the action stage of change. As such, her goal is to continue with weight loss efforts. She has agreed to follow the Category 2 plan. [name] will work on meal planning and intentional eating. [name] has been instructed to work up to a goal of 150 minutes of combined cardio and strengthening exercise per week for weight loss and overall health benefits. We discussed the following Behavioral Modification Strategies today: increasing lean protein intake, decreasing simple carbohydrates, increasing vegetables, increase H20 intake, decrease eating out, no skipping meals, work on meal planning and easy cooking plans, keeping healthy foods in the home, ways to avoid nighttime snacking, better snacking choices, and emotional eating strategies.  [name] has agreed to follow-up with our clinic in 4 weeks. She was informed of the importance of frequent follow-up visits to maximize her success with intensive lifestyle modifications for her multiple health conditions.  ALLERGIES: No Known Allergies  MEDICATIONS: Current Outpatient Medications on File Prior to Visit  Medication Sig Dispense Refill  . amLODipine (NORVASC) 10 MG tablet TAKE ONE TABLET BY MOUTH DAILY 90 tablet 1  . clotrimazole-betamethasone (LOTRISONE) cream Apply 1 application topically 2 (two) times daily. 30 g 0  . Cyanocobalamin (  VITAMIN B-12 PO) Take 1 tablet by mouth daily.    Marland Kitchen estradiol (ESTRACE) 2 MG tablet Take 1 mg by mouth as needed.     . fluticasone (FLONASE) 50 MCG/ACT nasal spray INSTILL TWO SPRAYS INTO BOTH NOSTRIL DAILY. 16 g 2  . hydrochlorothiazide  (HYDRODIURIL) 25 MG tablet TAKE ONE TABLET BY MOUTH DAILY 30 tablet 0  . hydroxychloroquine (PLAQUENIL) 200 MG tablet Take 200 mg by mouth 2 (two) times daily.      Marland Kitchen levothyroxine (SYNTHROID) 50 MCG tablet TAKE ONE TABLET BY MOUTH EVERY MORNING BEFORE BREAKFAST 90 tablet 1  . losartan (COZAAR) 100 MG tablet TAKE ONE TABLET BY MOUTH DAILY 30 tablet 0  . naftifine (NAFTIN) 1 % cream Apply topically daily. 60 g 2  . naproxen sodium (ALEVE) 220 MG tablet Take 220 mg by mouth.    Marland Kitchen omeprazole (PRILOSEC) 20 MG capsule TAKE ONE CAPSULE BY MOUTH DAILY 90 capsule 1  . potassium chloride (KLOR-CON) 10 MEQ tablet TAKE TWO TABLETS BY MOUTH TWICE A DAY 120 tablet 5  . RESTASIS 0.05 % ophthalmic emulsion Place 1 drop into both eyes daily.      No current facility-administered medications on file prior to visit.    PAST MEDICAL HISTORY: Past Medical History:  Diagnosis Date  . Abdominal pain, other specified site   . Acute upper respiratory infections of unspecified site   . Anemia   . Benign neoplasm of skin, site unspecified   . Disturbance of skin sensation   . Diverticulosis of colon (without mention of hemorrhage)   . Family history of diabetes mellitus   . Family history of ischemic heart disease   . Family history of malignant neoplasm of genital organ, other   . GERD (gastroesophageal reflux disease)   . Hypertension   . Hypokalemia   . Left shoulder pain   . Low blood potassium   . Post-menopausal   . Rheumatoid arthritis ([place])   . Sprain and strain of unspecified site of knee and leg   . Sprain of neck   . Thyroid disease   . Unspecified essential hypertension     PAST SURGICAL HISTORY: Past Surgical History:  Procedure Laterality Date  . ABDOMINAL HYSTERECTOMY    . BUNIONECTOMY  2005   right foot  . SHOULDER SURGERY     left;spurs 2-09- dr Rhoderick Moody  . TOTAL ABDOMINAL HYSTERECTOMY W/ BILATERAL SALPINGOOPHORECTOMY  2002    SOCIAL HISTORY: Social History   Tobacco Use  .  Smoking status: Never Smoker  . Smokeless tobacco: Never Used  Substance Use Topics  . Alcohol use: No  . Drug use: No    FAMILY HISTORY: Family History  Problem Relation Age of Onset  . Diabetes Mother   . Hypertension Mother   . Dementia Mother   . Stroke Mother   . Stroke Maternal Aunt   . Breast cancer Maternal Aunt   . Prostate cancer Father   . Hypertension Father   . Cancer Father   . Diabetes Brother   . Hypertension Sister   . Breast cancer Cousin   . Colon cancer Neg Hx    ROS: Review of Systems  Constitutional: Negative for weight loss.  Respiratory: Negative for shortness of breath.   Cardiovascular: Negative for chest pain.   PHYSICAL EXAM: Blood pressure 102/69, pulse 71, temperature 98 F (36.7 C), height 5\' 8"  (1.727 m), weight 300 lb (136.1 kg), SpO2 98 %. Body mass index is 45.61 kg/m. Physical Exam Vitals  reviewed.  Constitutional:      Appearance: Normal appearance. She is obese.  Cardiovascular:     Rate and Rhythm: Normal rate.     Pulses: Normal pulses.  Pulmonary:     Effort: Pulmonary effort is normal.     Breath sounds: Normal breath sounds.  Musculoskeletal:        General: Normal range of motion.  Skin:    General: Skin is warm and dry.  Neurological:     Mental Status: She is alert and oriented to person, place, and time.  Psychiatric:        Behavior: Behavior normal.   RECENT LABS AND TESTS: BMET    Component Value Date/Time   NA 142 02/21/2019 1045   K 3.9 02/21/2019 1045   CL 104 02/21/2019 1045   CO2 26 02/21/2019 1045   GLUCOSE 75 02/21/2019 1045   GLUCOSE 80 10/19/2018 0900   GLUCOSE 74 03/03/2006 1039   BUN 19 02/21/2019 1045   CREATININE 0.96 02/21/2019 1045   CALCIUM 8.9 02/21/2019 1045   GFRNONAA 65 02/21/2019 1045   GFRAA 75 02/21/2019 1045   Lab Results  Component Value Date   HGBA1C 5.4 02/21/2019   Lab Results  Component Value Date   INSULIN 8.2 02/21/2019   CBC    Component Value Date/Time    WBC 4.5 02/21/2019 1045   WBC 4.7 10/19/2018 0900   RBC 4.11 02/21/2019 1045   RBC 4.26 10/19/2018 0900   HGB 11.8 02/21/2019 1045   HCT 35.4 02/21/2019 1045   PLT 183 02/21/2019 1045   MCV 86 02/21/2019 1045   MCH 28.7 02/21/2019 1045   MCH 27.1 08/02/2011 1839   MCHC 33.3 02/21/2019 1045   MCHC 32.5 10/19/2018 0900   RDW 12.9 02/21/2019 1045   LYMPHSABS 1.3 02/21/2019 1045   MONOABS 0.4 10/19/2018 0900   EOSABS 0.3 02/21/2019 1045   BASOSABS 0.1 02/21/2019 1045   Iron/TIBC/Ferritin/ %Sat    Component Value Date/Time   IRON 63 02/21/2019 1045   TIBC 282 02/21/2019 1045   FERRITIN 45 02/21/2019 1045   IRONPCTSAT 22 02/21/2019 1045   Lipid Panel     Component Value Date/Time   CHOL 161 02/21/2019 1045   TRIG 66 02/21/2019 1045   HDL 67 02/21/2019 1045   CHOLHDL 3 10/19/2018 0900   VLDL 13.6 10/19/2018 0900   LDLCALC 81 02/21/2019 1045   LDLDIRECT 128.5 04/30/2008 0000   Hepatic Function Panel     Component Value Date/Time   PROT 6.5 02/21/2019 1045   ALBUMIN 3.7 (L) 02/21/2019 1045   AST 13 02/21/2019 1045   ALT 14 02/21/2019 1045   ALKPHOS 61 02/21/2019 1045   BILITOT 0.5 02/21/2019 1045   BILIDIR 0.1 08/08/2014 1520      Component Value Date/Time   TSH 1.850 02/21/2019 1045   TSH 3.00 10/19/2018 0900   TSH 2.61 06/15/2018 1021   I, Michaelene Song, am acting as Location manager for [employer], DO  I have reviewed the above documentation for accuracy and completeness, and I agree with the above. Jearld Lesch, DO

## 2019-04-12 LAB — BRAIN NATRIURETIC PEPTIDE: BNP: 33.4 pg/mL (ref 0.0–100.0)

## 2019-04-25 ENCOUNTER — Other Ambulatory Visit: Payer: Self-pay

## 2019-04-26 ENCOUNTER — Encounter: Payer: Self-pay | Admitting: Family Medicine

## 2019-04-26 ENCOUNTER — Ambulatory Visit: Payer: Managed Care, Other (non HMO) | Attending: Internal Medicine

## 2019-04-26 ENCOUNTER — Ambulatory Visit (INDEPENDENT_AMBULATORY_CARE_PROVIDER_SITE_OTHER): Payer: Managed Care, Other (non HMO) | Admitting: Family Medicine

## 2019-04-26 VITALS — BP 118/80 | HR 86 | Temp 97.0°F | Resp 18 | Ht 68.0 in | Wt 295.4 lb

## 2019-04-26 DIAGNOSIS — Z20822 Contact with and (suspected) exposure to covid-19: Secondary | ICD-10-CM

## 2019-04-26 DIAGNOSIS — Z7189 Other specified counseling: Secondary | ICD-10-CM | POA: Diagnosis not present

## 2019-04-26 DIAGNOSIS — E039 Hypothyroidism, unspecified: Secondary | ICD-10-CM

## 2019-04-26 DIAGNOSIS — Z6841 Body Mass Index (BMI) 40.0 and over, adult: Secondary | ICD-10-CM

## 2019-04-26 DIAGNOSIS — I10 Essential (primary) hypertension: Secondary | ICD-10-CM

## 2019-04-26 DIAGNOSIS — E785 Hyperlipidemia, unspecified: Secondary | ICD-10-CM

## 2019-04-26 DIAGNOSIS — J0140 Acute pansinusitis, unspecified: Secondary | ICD-10-CM

## 2019-04-26 DIAGNOSIS — M329 Systemic lupus erythematosus, unspecified: Secondary | ICD-10-CM

## 2019-04-26 MED ORDER — AMOXICILLIN-POT CLAVULANATE 875-125 MG PO TABS: 1.0000 | ORAL_TABLET | Freq: Two times a day (BID) | ORAL | 0 refills | Status: DC

## 2019-04-26 NOTE — Assessment & Plan Note (Signed)
Well controlled, no changes to meds. Encouraged heart healthy diet such as the DASH diet and exercise as tolerated.  °

## 2019-04-26 NOTE — Progress Notes (Signed)
Patient ID: [name], female    DOB: 18-Apr-1961  Age: 59 y.o. MRN: [id number]    Subjective:  Subjective  HPI [name] presents for f/u bp , thyroid  And c/o sinus symptoms--- pt states she did not know she was not supposed to be here with sinus symptoms   + sinus congestion / headache and body aches     Review of Systems  Constitutional: Negative for appetite change, diaphoresis, fatigue and unexpected weight change.  HENT: Positive for congestion, postnasal drip, sinus pressure, sinus pain, sneezing and sore throat. Negative for ear pain and facial swelling.   Eyes: Negative for pain, redness and visual disturbance.  Respiratory: Positive for cough. Negative for chest tightness, shortness of breath and wheezing.   Cardiovascular: Negative for chest pain, palpitations and leg swelling.  Endocrine: Negative for cold intolerance, heat intolerance, polydipsia, polyphagia and polyuria.  Genitourinary: Negative for difficulty urinating, dysuria and frequency.  Musculoskeletal: Positive for arthralgias.  Neurological: Negative for dizziness, light-headedness, numbness and headaches.    History Past Medical History:  Diagnosis Date  . Abdominal pain, other specified site   . Acute upper respiratory infections of unspecified site   . Anemia   . Benign neoplasm of skin, site unspecified   . Disturbance of skin sensation   . Diverticulosis of colon (without mention of hemorrhage)   . Family history of diabetes mellitus   . Family history of ischemic heart disease   . Family history of malignant neoplasm of genital organ, other   . GERD (gastroesophageal reflux disease)   . Hypertension   . Hypokalemia   . Left shoulder pain   . Low blood potassium   . Post-menopausal   . Rheumatoid arthritis ([place])   . Sprain and strain of unspecified site of knee and leg   . Sprain of neck   . Thyroid disease   . Unspecified essential hypertension     She has a past surgical history that  includes Total abdominal hysterectomy w/ bilateral salpingoophorectomy (2002); Bunionectomy (2005); Shoulder surgery; and Abdominal hysterectomy.   Her family history includes Breast cancer in her cousin and maternal aunt; Cancer in her father; Dementia in her mother; Diabetes in her brother and mother; Hypertension in her father, mother, and sister; Prostate cancer in her father; Stroke in her maternal aunt and mother.She reports that she has never smoked. She has never used smokeless tobacco. She reports that she does not drink alcohol or use drugs.  Current Outpatient Medications on File Prior to Visit  Medication Sig Dispense Refill  . amLODipine (NORVASC) 10 MG tablet TAKE ONE TABLET BY MOUTH DAILY 90 tablet 1  . clotrimazole-betamethasone (LOTRISONE) cream Apply 1 application topically 2 (two) times daily. 30 g 0  . Cyanocobalamin (VITAMIN B-12 PO) Take 1 tablet by mouth daily.    Marland Kitchen estradiol (ESTRACE) 2 MG tablet Take 1 mg by mouth as needed.     . fluticasone (FLONASE) 50 MCG/ACT nasal spray INSTILL TWO SPRAYS INTO BOTH NOSTRIL DAILY. 16 g 2  . furosemide (LASIX) 40 MG tablet 1 every 3 days if needed. 30 tablet 0  . hydrochlorothiazide (HYDRODIURIL) 25 MG tablet TAKE ONE TABLET BY MOUTH DAILY 30 tablet 0  . hydroxychloroquine (PLAQUENIL) 200 MG tablet Take 200 mg by mouth 2 (two) times daily.      Marland Kitchen levothyroxine (SYNTHROID) 50 MCG tablet TAKE ONE TABLET BY MOUTH EVERY MORNING BEFORE BREAKFAST 90 tablet 1  . losartan (COZAAR) 100 MG tablet TAKE  ONE TABLET BY MOUTH DAILY 30 tablet 0  . naftifine (NAFTIN) 1 % cream Apply topically daily. 60 g 2  . naproxen sodium (ALEVE) 220 MG tablet Take 220 mg by mouth.    Marland Kitchen omeprazole (PRILOSEC) 20 MG capsule TAKE ONE CAPSULE BY MOUTH DAILY 90 capsule 1  . potassium chloride (KLOR-CON) 10 MEQ tablet TAKE TWO TABLETS BY MOUTH TWICE A DAY 120 tablet 5  . RESTASIS 0.05 % ophthalmic emulsion Place 1 drop into both eyes daily.      No current  facility-administered medications on file prior to visit.     Objective:  Objective  Physical Exam Vitals and nursing note reviewed.  Constitutional:      Appearance: She is well-developed. She is not diaphoretic.  HENT:     Head: Normocephalic and atraumatic.     Nose: Mucosal edema present. No nasal deformity.     Right Sinus: Maxillary sinus tenderness and frontal sinus tenderness present.     Left Sinus: Maxillary sinus tenderness and frontal sinus tenderness present.     Comments: Unable to exam throat and nose + max sinus tenderness    Mouth/Throat:     Pharynx: No oropharyngeal exudate.  Eyes:     Conjunctiva/sclera: Conjunctivae normal.  Neck:     Thyroid: No thyromegaly.     Vascular: No carotid bruit or JVD.  Cardiovascular:     Rate and Rhythm: Normal rate and regular rhythm.     Heart sounds: Normal heart sounds. No murmur.  Pulmonary:     Effort: Pulmonary effort is normal. No respiratory distress.     Breath sounds: Normal breath sounds. No wheezing or rales.  Chest:     Chest wall: No tenderness.  Musculoskeletal:     Cervical back: Normal range of motion and neck supple.  Lymphadenopathy:     Cervical: No cervical adenopathy.  Skin:    General: Skin is warm.  Neurological:     Mental Status: She is alert and oriented to person, place, and time.    BP 118/80 (BP Location: Right Arm, Patient Position: Sitting, Cuff Size: Normal)   Pulse 86   Temp (!) 97 F (36.1 C) (Temporal)   Resp 18   Ht 5\' 8"  (1.727 m)   Wt 295 lb 6.4 oz (134 kg)   SpO2 99%   BMI 44.92 kg/m  Wt Readings from Last 3 Encounters:  04/26/19 295 lb 6.4 oz (134 kg)  04/11/19 300 lb (136.1 kg)  03/28/19 296 lb (134.3 kg)     Lab Results  Component Value Date   WBC 4.5 02/21/2019   HGB 11.8 02/21/2019   HCT 35.4 02/21/2019   PLT 183 02/21/2019   GLUCOSE 75 02/21/2019   CHOL 161 02/21/2019   TRIG 66 02/21/2019   HDL 67 02/21/2019   LDLDIRECT 128.5 04/30/2008   LDLCALC 81  02/21/2019   ALT 14 02/21/2019   AST 13 02/21/2019   NA 142 02/21/2019   K 3.9 02/21/2019   CL 104 02/21/2019   CREATININE 0.96 02/21/2019   BUN 19 02/21/2019   CO2 26 02/21/2019   TSH 1.850 02/21/2019   HGBA1C 5.4 02/21/2019    US BREAST LTD UNI LEFT INC AXILLA  Result Date: 06/07/2018 CLINICAL DATA:  59 year old patient recalled recent screening mammogram for evaluation 2 circumscribed left breast masses. EXAM: ULTRASOUND OF THE LEFT BREAST COMPARISON:  May 23, 2018 and earlier priors FINDINGS: Targeted ultrasound is performed, showing a circumscribed oval mass with well-defined posterior  wall and posterior acoustic enhancement at 12 o'clock position 6 cm from nipple measuring 1.6 x 0.6 x 1.3 cm. There are some internal echoes, and no internal vascular flow. Findings consistent with a mildly complicated benign cyst. Adjacent to this mass in the 12 o'clock position 4 cm from the nipple is a 0.9 x 0.9 x 0.7 cm circumscribed oval mass consistent with a simple cyst. There is no internal vascular flow. These cysts correspond in size, shape, and location to the mammographically detected masses. No suspicious mass identified on ultrasound. IMPRESSION: Benign cysts in the 12 o'clock axis of the left breast. No evidence of malignancy. RECOMMENDATION: Screening mammogram in one year.(Code:SM-[id number]) I have discussed the findings and recommendations with the patient. Results were also provided in writing at the conclusion of the visit. If applicable, a reminder letter will be sent to the patient regarding the next appointment. BI-RADS CATEGORY  2: Benign. Electronically Signed   By: Curlene Dolphin M.D.   On: 06/07/2018 14:57     Assessment & Plan:  Plan  I am having Lourena Simmonds start on amoxicillin-clavulanate. I am also having her maintain her estradiol, hydroxychloroquine, Restasis, Cyanocobalamin (VITAMIN B-12 PO), naproxen sodium, fluticasone, clotrimazole-betamethasone, naftifine, amLODipine,  omeprazole, levothyroxine, potassium chloride, losartan, hydrochlorothiazide, and furosemide.  Meds ordered this encounter  Medications  . amoxicillin-clavulanate (AUGMENTIN) 875-125 MG tablet    Sig: Take 1 tablet by mouth 2 (two) times daily.    Dispense:  20 tablet    Refill:  0    Problem List Items Addressed This Visit      Unprioritized   Acute non-recurrent pansinusitis    flonase and antihistamine  abx per orders  Pt will get covid test        Relevant Medications   amoxicillin-clavulanate (AUGMENTIN) 875-125 MG tablet   Counseled about COVID-19 virus infection    Pt will get covid test She will quarantine at home       Essential hypertension    Well controlled, no changes to meds. Encouraged heart healthy diet such as the DASH diet and exercise as tolerated.       Relevant Orders   CBC with Differential   Lipid panel   Comprehensive metabolic panel   Hyperlipidemia    Poorly controlled will alter medications, encouraged DASH diet, minimize caffeine and obtain adequate sleep. Report concerning symptoms and follow up as directed and as needed      Hypothyroidism - Primary    Check labs at a later date      Relevant Orders   TSH   Comprehensive metabolic panel   SLE (systemic lupus erythematosus related syndrome) ([place]), followed by Rheumatology, Rx Plaquenil    Per rheum         Follow-up: Return in about 6 months (around 10/24/2019), or if symptoms worsen or fail to improve, for annual exam, fasting.  [name], DO

## 2019-04-26 NOTE — Assessment & Plan Note (Signed)
Pt will get covid test She will quarantine at home

## 2019-04-26 NOTE — Assessment & Plan Note (Addendum)
Poorly controlled will alter medications, encouraged DASH diet, minimize caffeine and obtain adequate sleep. Report concerning symptoms and follow up as directed and as needed 

## 2019-04-26 NOTE — Assessment & Plan Note (Signed)
Per rheum 

## 2019-04-26 NOTE — Assessment & Plan Note (Signed)
flonase and antihistamine  abx per orders  Pt will get covid test

## 2019-04-26 NOTE — Assessment & Plan Note (Signed)
Check labs at a later date

## 2019-04-26 NOTE — Patient Instructions (Signed)
Sinusitis, Adult Sinusitis is inflammation of your sinuses. Sinuses are hollow spaces in the bones around your face. Your sinuses are located:  Around your eyes.  In the middle of your forehead.  Behind your nose.  In your cheekbones. Mucus normally drains out of your sinuses. When your nasal tissues become inflamed or swollen, mucus can become trapped or blocked. This allows bacteria, viruses, and fungi to grow, which leads to infection. Most infections of the sinuses are caused by a virus. Sinusitis can develop quickly. It can last for up to 4 weeks (acute) or for more than 12 weeks (chronic). Sinusitis often develops after a cold. What are the causes? This condition is caused by anything that creates swelling in the sinuses or stops mucus from draining. This includes:  Allergies.  Asthma.  Infection from bacteria or viruses.  Deformities or blockages in your nose or sinuses.  Abnormal growths in the nose (nasal polyps).  Pollutants, such as chemicals or irritants in the air.  Infection from fungi (rare). What increases the risk? You are more likely to develop this condition if you:  Have a weak body defense system (immune system).  Do a lot of swimming or diving.  Overuse nasal sprays.  Smoke. What are the signs or symptoms? The main symptoms of this condition are pain and a feeling of pressure around the affected sinuses. Other symptoms include:  Stuffy nose or congestion.  Thick drainage from your nose.  Swelling and warmth over the affected sinuses.  Headache.  Upper toothache.  A cough that may get worse at night.  Extra mucus that collects in the throat or the back of the nose (postnasal drip).  Decreased sense of smell and taste.  Fatigue.  A fever.  Sore throat.  Bad breath. How is this diagnosed? This condition is diagnosed based on:  Your symptoms.  Your medical history.  A physical exam.  Tests to find out if your condition is  acute or chronic. This may include: ? Checking your nose for nasal polyps. ? Viewing your sinuses using a device that has a light (endoscope). ? Testing for allergies or bacteria. ? Imaging tests, such as an MRI or CT scan. In rare cases, a bone biopsy may be done to rule out more serious types of fungal sinus disease. How is this treated? Treatment for sinusitis depends on the cause and whether your condition is chronic or acute.  If caused by a virus, your symptoms should go away on their own within 10 days. You may be given medicines to relieve symptoms. They include: ? Medicines that shrink swollen nasal passages (topical intranasal decongestants). ? Medicines that treat allergies (antihistamines). ? A spray that eases inflammation of the nostrils (topical intranasal corticosteroids). ? Rinses that help get rid of thick mucus in your nose (nasal saline washes).  If caused by bacteria, your health care provider may recommend waiting to see if your symptoms improve. Most bacterial infections will get better without antibiotic medicine. You may be given antibiotics if you have: ? A severe infection. ? A weak immune system.  If caused by narrow nasal passages or nasal polyps, you may need to have surgery. Follow these instructions at home: Medicines  Take, use, or apply over-the-counter and prescription medicines only as told by your health care provider. These may include nasal sprays.  If you were prescribed an antibiotic medicine, take it as told by your health care provider. Do not stop taking the antibiotic even if you start   to feel better. Hydrate and humidify   Drink enough fluid to keep your urine pale yellow. Staying hydrated will help to thin your mucus.  Use a cool mist humidifier to keep the humidity level in your home above 50%.  Inhale steam for 10-15 minutes, 3-4 times a day, or as told by your health care provider. You can do this in the bathroom while a hot shower is  running.  Limit your exposure to cool or dry air. Rest  Rest as much as possible.  Sleep with your head raised (elevated).  Make sure you get enough sleep each night. General instructions   Apply a warm, moist washcloth to your face 3-4 times a day or as told by your health care provider. This will help with discomfort.  Wash your hands often with soap and water to reduce your exposure to germs. If soap and water are not available, use hand sanitizer.  Do not smoke. Avoid being around people who are smoking (secondhand smoke).  Keep all follow-up visits as told by your health care provider. This is important. Contact a health care provider if:  You have a fever.  Your symptoms get worse.  Your symptoms do not improve within 10 days. Get help right away if:  You have a severe headache.  You have persistent vomiting.  You have severe pain or swelling around your face or eyes.  You have vision problems.  You develop confusion.  Your neck is stiff.  You have trouble breathing. Summary  Sinusitis is soreness and inflammation of your sinuses. Sinuses are hollow spaces in the bones around your face.  This condition is caused by nasal tissues that become inflamed or swollen. The swelling traps or blocks the flow of mucus. This allows bacteria, viruses, and fungi to grow, which leads to infection.  If you were prescribed an antibiotic medicine, take it as told by your health care provider. Do not stop taking the antibiotic even if you start to feel better.  Keep all follow-up visits as told by your health care provider. This is important. This information is not intended to replace advice given to you by your health care provider. Make sure you discuss any questions you have with your health care provider. Document Revised: 09/05/2017 Document Reviewed: 09/05/2017 Elsevier Patient Education  2020 Else[employer] Inc[employer]  

## 2019-04-28 LAB — NOVEL CORONAVIRUS, NAA: SARS-CoV-2, NAA: DETECTED — AB

## 2019-05-07 ENCOUNTER — Ambulatory Visit (INDEPENDENT_AMBULATORY_CARE_PROVIDER_SITE_OTHER): Payer: Managed Care, Other (non HMO) | Admitting: Family Medicine

## 2019-05-20 ENCOUNTER — Other Ambulatory Visit (INDEPENDENT_AMBULATORY_CARE_PROVIDER_SITE_OTHER): Payer: Self-pay | Admitting: Family Medicine

## 2019-05-20 DIAGNOSIS — I10 Essential (primary) hypertension: Secondary | ICD-10-CM

## 2019-05-21 ENCOUNTER — Ambulatory Visit (INDEPENDENT_AMBULATORY_CARE_PROVIDER_SITE_OTHER): Payer: Managed Care, Other (non HMO) | Admitting: Family Medicine

## 2019-05-21 ENCOUNTER — Other Ambulatory Visit: Payer: Self-pay

## 2019-05-21 ENCOUNTER — Encounter (INDEPENDENT_AMBULATORY_CARE_PROVIDER_SITE_OTHER): Payer: Self-pay | Admitting: Family Medicine

## 2019-05-21 VITALS — BP 107/73 | HR 76 | Temp 98.0°F | Ht 68.0 in | Wt 294.0 lb

## 2019-05-21 DIAGNOSIS — R600 Localized edema: Secondary | ICD-10-CM

## 2019-05-21 DIAGNOSIS — Z6841 Body Mass Index (BMI) 40.0 and over, adult: Secondary | ICD-10-CM

## 2019-05-21 DIAGNOSIS — E559 Vitamin D deficiency, unspecified: Secondary | ICD-10-CM

## 2019-05-21 DIAGNOSIS — Z9189 Other specified personal risk factors, not elsewhere classified: Secondary | ICD-10-CM | POA: Diagnosis not present

## 2019-05-21 NOTE — Progress Notes (Signed)
Chief Complaint:   OBESITY [name] is here to discuss her progress with her obesity treatment plan along with follow-up of her obesity related diagnoses. [name] is on the Category 2 Plan and states she is following her eating plan approximately 0% of the time. [name] states she is walking for 30 minutes 5 times per week.  Today's visit was #: 5 Starting weight: 297 lbs Starting date: 02/21/2019 Today's weight: 294 lbs Today's date: 05/21/2019 Total lbs lost to date: 3 Total lbs lost since last in-office visit: 6  Interim History: [name] had Twining recently and she reports she has been off the plan and eating comfort food such as soup.  She has been skipping meals. She is ready to get back on the plan. She is eating a revised plan per Dr. Juleen China with regular yogurt and lactaid milk. She will start the [employer]illian [employer] fast on February 4.   Subjective:   1. Vitamin D deficiency [name]'s Vit D is not at goal. She has not been taking OTC or Rx Vit D.  2. Lower extremity edema [name] was prescribed Lasix q 3 days by Dr. Owens Shark at her last office visit. She cannot tell if it is helping yet. She reports she generally has the swelling at the time of the month when she used to have menses. Her BNP was checked at her last visit and was within normal limits.  3. At risk for osteoporosis [name] is at higher risk of osteopenia and osteoporosis due to Vitamin D deficiency.   Assessment/Plan:   1. Vitamin D deficiency Low Vitamin D level contributes to fatigue and are associated with obesity, breast, and colon cancer. [name] was advised to take Vit D3 2,000 IU daily. We will recheck labs at her next visit. She will follow-up for routine testing of Vitamin D, at least 2-3 times per year to avoid over-replacement.  2. Lower extremity edema [name] agreed to continue her Lasix and will follow up.  3. At risk for osteoporosis [name] was given approximately 15 minutes of osteoporosis prevention counseling  today. [name] is at risk for osteopenia and osteoporosis due to her Vitamin D deficiency. She was encouraged to take her Vitamin D and follow her higher calcium diet and increase strengthening exercise to help strengthen her bones and decrease her risk of osteopenia and osteoporosis.  Repetitive spaced learning was employed today to elicit superior memory formation and behavioral change.  4. Class 3 severe obesity with serious comorbidity and body mass index (BMI) of 40.0 to 44.9 in adult, unspecified obesity type [facility]) [name] is currently in the action stage of change. As such, her goal is to continue with weight loss efforts. She has agreed to the Category 2 Plan.   [name] was given a handout for the [employer].  Encouraged fish with the Quillian Quince fast twice daily and eggs for breakfast.  Exercise goals: [name] is to continue her current exercise regimen.  Behavioral modification strategies: increasing lean protein intake, no skipping meals and planning for success.    [name] has agreed to follow-up with our clinic in 2 weeks. She was informed of the importance of frequent follow-up visits to maximize her success with intensive lifestyle modifications for her multiple health conditions.   Objective:   Blood pressure 107/73, pulse 76, temperature 98 F (36.7 C), temperature source Oral, height 5\' 8"  (1.727 m), weight 294 lb (133.4 kg), SpO2 98 %. Body mass index is 44.7 kg/m.  General: Cooperative, alert, well developed, in no  acute distress. HEENT: Conjunctivae and lids unremarkable. Cardiovascular: Regular rhythm.  Lungs: Normal work of breathing. Neurologic: No focal deficits.   Lab Results  Component Value Date   CREATININE 0.96 02/21/2019   BUN 19 02/21/2019   NA 142 02/21/2019   K 3.9 02/21/2019   CL 104 02/21/2019   CO2 26 02/21/2019   Lab Results  Component Value Date   ALT 14 02/21/2019   AST 13 02/21/2019   ALKPHOS 61 02/21/2019   BILITOT 0.5 02/21/2019   Lab  Results  Component Value Date   HGBA1C 5.4 02/21/2019   Lab Results  Component Value Date   INSULIN 8.2 02/21/2019   Lab Results  Component Value Date   TSH 1.850 02/21/2019   Lab Results  Component Value Date   CHOL 161 02/21/2019   HDL 67 02/21/2019   LDLCALC 81 02/21/2019   LDLDIRECT 128.5 04/30/2008   TRIG 66 02/21/2019   CHOLHDL 3 10/19/2018   Lab Results  Component Value Date   WBC 4.5 02/21/2019   HGB 11.8 02/21/2019   HCT 35.4 02/21/2019   MCV 86 02/21/2019   PLT 183 02/21/2019   Lab Results  Component Value Date   IRON 63 02/21/2019   TIBC 282 02/21/2019   FERRITIN 45 02/21/2019   Attestation Statements:   Reviewed by clinician on day of visit: allergies, medications, problem list, medical history, surgical history, family history, social history, and previous encounter notes.   [name], am acting as Location manager for Andrea Schwab, FNP-C.  I have reviewed the above documentation for accuracy and completeness, and I agree with the above. -  Andrea Fick, FNP

## 2019-05-22 ENCOUNTER — Encounter (INDEPENDENT_AMBULATORY_CARE_PROVIDER_SITE_OTHER): Payer: Self-pay | Admitting: Family Medicine

## 2019-06-02 ENCOUNTER — Encounter: Payer: Self-pay | Admitting: Family Medicine

## 2019-06-06 ENCOUNTER — Ambulatory Visit (INDEPENDENT_AMBULATORY_CARE_PROVIDER_SITE_OTHER): Payer: Managed Care, Other (non HMO) | Admitting: Family Medicine

## 2019-06-06 ENCOUNTER — Other Ambulatory Visit: Payer: Self-pay

## 2019-06-06 ENCOUNTER — Encounter (INDEPENDENT_AMBULATORY_CARE_PROVIDER_SITE_OTHER): Payer: Self-pay | Admitting: Family Medicine

## 2019-06-06 VITALS — BP 114/71 | HR 75 | Temp 97.9°F | Ht 68.0 in | Wt 291.0 lb

## 2019-06-06 DIAGNOSIS — Z6841 Body Mass Index (BMI) 40.0 and over, adult: Secondary | ICD-10-CM

## 2019-06-06 DIAGNOSIS — E559 Vitamin D deficiency, unspecified: Secondary | ICD-10-CM

## 2019-06-06 DIAGNOSIS — E039 Hypothyroidism, unspecified: Secondary | ICD-10-CM

## 2019-06-06 DIAGNOSIS — I10 Essential (primary) hypertension: Secondary | ICD-10-CM

## 2019-06-06 DIAGNOSIS — M329 Systemic lupus erythematosus, unspecified: Secondary | ICD-10-CM | POA: Diagnosis not present

## 2019-06-06 DIAGNOSIS — Z9189 Other specified personal risk factors, not elsewhere classified: Secondary | ICD-10-CM

## 2019-06-06 NOTE — Progress Notes (Signed)
Chief Complaint:   OBESITY [name] is here to discuss her progress with her obesity treatment plan along with follow-up of her obesity related diagnoses. [name] is on the Category 2 Plan and states she is following her eating plan approximately 50% of the time. [name] states she is walking and doing jumping jacks for 30-45 minutes 5 times per week.  Today's visit was #: 6 Starting weight: 297 lbs Starting date: 02/21/2019 Today's weight: 291 lbs Today's date: 06/06/2019 Total lbs lost to date: 6 lbs Total lbs lost since last in-office visit: 3 lbs  Interim History: [name] says she is doing the [employer] now.  She started on February 1st and will continue through February 22nd.  She is eating no cheese and no meat.  Fish is okay.  Subjective:   1. Essential hypertension Review: taking medications as instructed, no medication side effects noted, no chest pain on exertion, no dyspnea on exertion, no swelling of ankles.  Blood pressure is at goal.  She is taking amlodipine, HCTZ, losartan, and Lasix as needed (rarely).  BP Readings from Last 3 Encounters:  06/06/19 114/71  05/21/19 107/73  04/26/19 118/80   2. Vitamin D deficiency [name]'s Vitamin D level was 44.1 on 02/21/2019. She is currently taking vit D. She denies nausea, vomiting or muscle weakness.  3. Acquired hypothyroidism [name] is taking levothyroxine 50 mcg daily.   Lab Results  Component Value Date   TSH 1.850 02/21/2019   4. SLE (systemic lupus erythematosus related syndrome) ([name]), followed by Rheumatology, Rx Plaquenil [name] is taking Plaquenil for SLE.  She is followed by Rheumatology.  5. At risk for diabetes mellitus [name] is at higher than average risk for developing diabetes due to her obesity.   Assessment/Plan:   1. Essential hypertension [name] is working on healthy weight loss and exercise to improve blood pressure control. We will watch for signs of hypotension as she continues her lifestyle  modifications.  2. Vitamin D deficiency Low Vitamin D level contributes to fatigue and are associated with obesity, breast, and colon cancer. She agrees to continue to take Vitamin D @2 ,000 IU daily and will follow-up for routine testing of Vitamin D, at least 2-3 times per year to avoid over-replacement.  3. Acquired hypothyroidism Patient with long-standing hypothyroidism, on levothyroxine therapy. She appears euthyroid. Orders and follow up as documented in patient record.  Counseling . Good thyroid control is important for overall health. Supratherapeutic thyroid levels are dangerous and will not improve weight loss results. . The correct way to take levothyroxine is fasting, with water, separated by at least 30 minutes from breakfast, and separated by more than 4 hours from calcium, iron, multivitamins, acid reflux medications (PPIs).   4. SLE (systemic lupus erythematosus related syndrome) ([name]), followed by Rheumatology, Rx Plaquenil Will monitor.  5. At risk for diabetes mellitus [name] was given approximately 15 minutes of diabetes education and counseling today. We discussed intensive lifestyle modifications today with an emphasis on weight loss as well as increasing exercise and decreasing simple carbohydrates in her diet. We also reviewed medication options with an emphasis on risk versus benefit of those discussed.   Repetitive spaced learning was employed today to elicit superior memory formation and behavioral change.  6. Class 3 severe obesity with serious comorbidity and body mass index (BMI) of 40.0 to 44.9 in adult, unspecified obesity type [name]) [name] is currently in the action stage of change. As such, her goal is to continue with weight loss  efforts. She has agreed to the Category 2 Plan.   Exercise goals: As is.  Behavioral modification strategies: increasing lean protein intake.  [name] has agreed to follow-up with our clinic in 2 weeks. She was informed of the  importance of frequent follow-up visits to maximize her success with intensive lifestyle modifications for her multiple health conditions.   Objective:   Blood pressure 114/71, pulse 75, temperature 97.9 F (36.6 C), temperature source Oral, height 5\' 8"  (1.727 m), weight 291 lb (132 kg), SpO2 100 %. Body mass index is 44.25 kg/m.  General: Cooperative, alert, well developed, in no acute distress. HEENT: Conjunctivae and lids unremarkable. Cardiovascular: Regular rhythm.  Lungs: Normal work of breathing. Neurologic: No focal deficits.   Lab Results  Component Value Date   CREATININE 0.96 02/21/2019   BUN 19 02/21/2019   NA 142 02/21/2019   K 3.9 02/21/2019   CL 104 02/21/2019   CO2 26 02/21/2019   Lab Results  Component Value Date   ALT 14 02/21/2019   AST 13 02/21/2019   ALKPHOS 61 02/21/2019   BILITOT 0.5 02/21/2019   Lab Results  Component Value Date   HGBA1C 5.4 02/21/2019   Lab Results  Component Value Date   INSULIN 8.2 02/21/2019   Lab Results  Component Value Date   TSH 1.850 02/21/2019   Lab Results  Component Value Date   CHOL 161 02/21/2019   HDL 67 02/21/2019   LDLCALC 81 02/21/2019   LDLDIRECT 128.5 04/30/2008   TRIG 66 02/21/2019   CHOLHDL 3 10/19/2018   Lab Results  Component Value Date   WBC 4.5 02/21/2019   HGB 11.8 02/21/2019   HCT 35.4 02/21/2019   MCV 86 02/21/2019   PLT 183 02/21/2019   Lab Results  Component Value Date   IRON 63 02/21/2019   TIBC 282 02/21/2019   FERRITIN 45 02/21/2019   Attestation Statements:   Reviewed by clinician on day of visit: allergies, medications, problem list, medical history, surgical history, family history, social history, and previous encounter notes.  I, Water quality scientist, CMA, am acting as Location manager for [employer], DO.  I have reviewed the above documentation for accuracy and completeness, and I agree with the above. Briscoe Deutscher, DO

## 2019-06-19 ENCOUNTER — Encounter: Payer: Self-pay | Admitting: Adult Health

## 2019-06-19 ENCOUNTER — Other Ambulatory Visit: Payer: Self-pay | Admitting: Family Medicine

## 2019-06-19 DIAGNOSIS — I10 Essential (primary) hypertension: Secondary | ICD-10-CM

## 2019-06-20 ENCOUNTER — Encounter: Payer: Self-pay | Admitting: Gastroenterology

## 2019-06-20 ENCOUNTER — Ambulatory Visit (INDEPENDENT_AMBULATORY_CARE_PROVIDER_SITE_OTHER): Payer: Managed Care, Other (non HMO) | Admitting: Family Medicine

## 2019-06-20 ENCOUNTER — Other Ambulatory Visit: Payer: Self-pay

## 2019-06-20 ENCOUNTER — Encounter (INDEPENDENT_AMBULATORY_CARE_PROVIDER_SITE_OTHER): Payer: Self-pay | Admitting: Family Medicine

## 2019-06-20 VITALS — BP 102/68 | HR 72 | Temp 98.3°F | Ht 68.0 in | Wt 291.0 lb

## 2019-06-20 DIAGNOSIS — Z6841 Body Mass Index (BMI) 40.0 and over, adult: Secondary | ICD-10-CM | POA: Diagnosis not present

## 2019-06-20 DIAGNOSIS — I10 Essential (primary) hypertension: Secondary | ICD-10-CM | POA: Diagnosis not present

## 2019-06-20 DIAGNOSIS — R140 Abdominal distension (gaseous): Secondary | ICD-10-CM | POA: Diagnosis not present

## 2019-06-20 MED ORDER — VITAMIN D 50 MCG (2000 UT) PO CAPS: 1.0000 | ORAL_CAPSULE | Freq: Every day | ORAL | 0 refills | Status: AC

## 2019-06-20 NOTE — Progress Notes (Signed)
Chief Complaint:   OBESITY [name] is here to discuss her progress with her obesity treatment plan along with follow-up of her obesity related diagnoses. [name] is on the Category 2 Plan and states she is following her eating plan approximately 50% of the time. [name] states she is doing jumping jacks/walking 45 minutes 5 times per week.  Today's visit was #: 7 Starting weight: 297 lbs Starting date: 02/21/2019 Today's weight: 291 lbs Today's date: 06/20/2019 Total lbs lost to date: 6 Total lbs lost since last in-office visit: 0  Interim History: [name] admits to skipping lunch and dinner over the past few weeks due feeling bloated. She has Balanced Breaks at lunch rather than a sandwich or microwave meal.  Subjective:   Bloating. [name] reports bloating 1.5 weeks out of the month at the time of her menses used to occur. She skips 2 out of 3 meals on these days.  Essential hypertension. Blood pressure is well controlled, though it runs on the lower side. She is compliant with all of her medications. No chest pain or shortness of breath. No dizziness.  BP Readings from Last 3 Encounters:  06/20/19 102/68  06/06/19 114/71  05/21/19 107/73   Lab Results  Component Value Date   CREATININE 0.96 02/21/2019   CREATININE 0.94 10/19/2018   CREATININE 1.01 06/15/2018   Assessment/Plan:   Bloating. [name] will increase protein during these times when she generally skips meals.  Essential hypertension. [name] is working on healthy weight loss and exercise to improve blood pressure control. We will watch for signs of hypotension as she continues her lifestyle modifications. She will continue her medications as directed.  Class 3 severe obesity with serious comorbidity and body mass index (BMI) of 40.0 to 44.9 in adult, unspecified obesity type ([place]).  [name] is currently in the action stage of change. As such, her goal is to continue with weight loss efforts. She has agreed to  the Category 2 Plan and journal 400-600 calories + 35 grams of protein at supper..   Exercise goals: [name] will continue her current exercise regimen.  Behavioral modification strategies: increasing lean protein intake, no skipping meals, meal planning and cooking strategies and planning for success.  [name] has agreed to follow-up with our clinic in 3 weeks. She was informed of the importance of frequent follow-up visits to maximize her success with intensive lifestyle modifications for her multiple health conditions.   Objective:   Blood pressure 102/68, pulse 72, temperature 98.3 F (36.8 C), temperature source Oral, height 5\' 8"  (1.727 m), weight 291 lb (132 kg), SpO2 99 %. Body mass index is 44.25 kg/m.  General: Cooperative, alert, well developed, in no acute distress. HEENT: Conjunctivae and lids unremarkable. Cardiovascular: Regular rhythm.  Lungs: Normal work of breathing. Neurologic: No focal deficits.   Lab Results  Component Value Date   CREATININE 0.96 02/21/2019   BUN 19 02/21/2019   NA 142 02/21/2019   K 3.9 02/21/2019   CL 104 02/21/2019   CO2 26 02/21/2019   Lab Results  Component Value Date   ALT 14 02/21/2019   AST 13 02/21/2019   ALKPHOS 61 02/21/2019   BILITOT 0.5 02/21/2019   Lab Results  Component Value Date   HGBA1C 5.4 02/21/2019   Lab Results  Component Value Date   INSULIN 8.2 02/21/2019   Lab Results  Component Value Date   TSH 1.850 02/21/2019   Lab Results  Component Value Date   CHOL 161 02/21/2019  HDL 67 02/21/2019   LDLCALC 81 02/21/2019   LDLDIRECT 128.5 04/30/2008   TRIG 66 02/21/2019   CHOLHDL 3 10/19/2018   Lab Results  Component Value Date   WBC 4.5 02/21/2019   HGB 11.8 02/21/2019   HCT 35.4 02/21/2019   MCV 86 02/21/2019   PLT 183 02/21/2019   Lab Results  Component Value Date   IRON 63 02/21/2019   TIBC 282 02/21/2019   FERRITIN 45 02/21/2019   Attestation Statements:   Reviewed by clinician on day  of visit: allergies, medications, problem list, medical history, surgical history, family history, social history, and previous encounter notes.  IMichaelene Song, am acting as Location manager for [employer], FNP   I have reviewed the above documentation for accuracy and completeness, and I agree with the above. -  Georgianne Fick, FNP

## 2019-06-21 ENCOUNTER — Ambulatory Visit: Payer: Managed Care, Other (non HMO) | Admitting: Adult Health

## 2019-06-21 ENCOUNTER — Encounter: Payer: Self-pay | Admitting: Adult Health

## 2019-06-21 ENCOUNTER — Encounter (INDEPENDENT_AMBULATORY_CARE_PROVIDER_SITE_OTHER): Payer: Self-pay | Admitting: Family Medicine

## 2019-06-21 VITALS — BP 124/70 | HR 80 | Ht 68.0 in | Wt 300.0 lb

## 2019-06-21 DIAGNOSIS — G4733 Obstructive sleep apnea (adult) (pediatric): Secondary | ICD-10-CM | POA: Diagnosis not present

## 2019-06-21 DIAGNOSIS — Z9989 Dependence on other enabling machines and devices: Secondary | ICD-10-CM | POA: Diagnosis not present

## 2019-06-21 NOTE — Progress Notes (Signed)
Order for mask refit sent to [facility] via community message. Confirmation received that the order transmitted was successful.  

## 2019-06-21 NOTE — Progress Notes (Signed)
PATIENT: [name] DOB: [birth year]  REASON FOR VISIT: follow up HISTORY FROM: patient  HISTORY OF PRESENT ILLNESS:  Today 06/21/19:  [name] is a 59 year old female with a history of obstructive sleep apnea on CPAP.  Her download indicates that she use her machine 28 out of 30 days for compliance of 93%.  She use her machine greater than 4 hours 20 days for compliance of 67%.  On average she uses her machine 4 hours and 27 minutes.  Her residual AHI is 2.9 on 5 to 15 cm of water with EPR 2.  Leak in the 95th percentile is 63.7 L/min.  She does feel the mask leaking.  She states that she also has allergies and sometimes finds the mask hard to use.  HISTORY (Copied from Dr.Dohmeier's note) LULAMAE Porter a 59 y.o. year old female patientis seenon 03/01/2019   I have the pleasure of seeing [name] today,a right-handed African American   female referred by [facility]- with a possible sleep disorder.  She has a past medical history of Abdominal pain, other specified site, Acute upper respiratory infections of unspecified site, Anemia, Benign neoplasm of skin, site unspecified, morbid obesity/ BMI 46. Disturbance of skin sensation, Diverticulosis of colon (without mention of hemorrhage),  GERD (gastroesophageal reflux disease), Hypertension, Hypokalemia,Low blood potassium, Post-menopausal, Rheumatoid arthritis ([place]),  Thyroid disease, and essential hypertension. The patient has undergone metabolic rate testing at [facility].   Sleeprelevant medical history: patient was told she snores,  Nocturia; 2-3 , no Tonsillectomy. Familymedical /sleep history:Family history of diabetes mellitus, Family history of ischemic heart disease, Family history of malignant neoplasm of genital organ, and of asthma, other family member on CPAP with OSA.   Social history:Patient is working as an Marine scientist at [employer].  she lives in a household with persons. Family  status is married , without children.  The patient currently works from 8-18.00 Mo- Thursday. Pets are not  present.Tobacco use: none- but grew up with passive smoke- ETOH use- seldomly , Caffeine intake in form of Coffee( 2 AM ) Soda( 1/ day ) Tea ( seldom) or energy drinks.  Hobbies :mary kay.   Sleep habits are as follows: The patient's dinner time is between 7-8 PM, it;s rather a snack , like a fruit, but not full of protein.. The patient goes to bed at 11.30 PM and continues to sleep for several hours, wakes for 2.30 and 4 AM bathroom breaks.    The preferred sleep position is prone and she often sleep on her side, with the support of 1 pillow. Dreams are reportedly rare.   6.30 AM is the usual rise time.  The patient wakes up spontaneously at 5.30 without an alarm ( set at 6 h AM ) .  She reports not feeling refreshed or restored in AM, with symptoms such as  morning headaches ( 2-3 times 2-3 week). Naps are taken infrequently, tries to power nap for 30 minutes - and are refreshing.  REVIEW OF SYSTEMS: Out of a complete 14 system review of symptoms, the patient complains only of the following symptoms, and all other reviewed systems are negative.  FSS 25 ESS 7  ALLERGIES: No Known Allergies  HOME MEDICATIONS: Outpatient Medications Prior to Visit  Medication Sig Dispense Refill  . amLODipine (NORVASC) 10 MG tablet TAKE ONE TABLET BY MOUTH DAILY 90 tablet 0  . Cholecalciferol (VITAMIN D) 50 MCG (2000 UT) CAPS Take 1  capsule (2,000 Units total) by mouth daily. 30 capsule 0  . clotrimazole-betamethasone (LOTRISONE) cream Apply 1 application topically 2 (two) times daily. 30 g 0  . Cyanocobalamin (VITAMIN B-12 PO) Take 1 tablet by mouth daily.    [name] estradiol (ESTRACE) 2 MG tablet Take 1 mg by mouth as needed.     . fluticasone (FLONASE) 50 MCG/ACT nasal spray INSTILL TWO SPRAYS INTO BOTH NOSTRIL DAILY. 16 g 2  . furosemide (LASIX) 40 MG tablet 1 every 3 days if needed. 30 tablet 0   . hydrochlorothiazide (HYDRODIURIL) 25 MG tablet TAKE ONE TABLET BY MOUTH DAILY 90 tablet 1  . hydroxychloroquine (PLAQUENIL) 200 MG tablet Take 200 mg by mouth 2 (two) times daily.      [name] levothyroxine (SYNTHROID) 50 MCG tablet TAKE ONE TABLET BY MOUTH EVERY MORNING BEFORE BREAKFAST 90 tablet 1  . losartan (COZAAR) 100 MG tablet TAKE ONE TABLET BY MOUTH DAILY 90 tablet 1  . naftifine (NAFTIN) 1 % cream Apply topically daily. 60 g 2  . naproxen sodium (ALEVE) 220 MG tablet Take 220 mg by mouth.    [name] omeprazole (PRILOSEC) 20 MG capsule TAKE ONE CAPSULE BY MOUTH DAILY 90 capsule 1  . potassium chloride (KLOR-CON) 10 MEQ tablet TAKE TWO TABLETS BY MOUTH TWICE A DAY 120 tablet 5  . RESTASIS 0.05 % ophthalmic emulsion Place 1 drop into both eyes daily.      No facility-administered medications prior to visit.    PAST MEDICAL HISTORY: Past Medical History:  Diagnosis Date  . Abdominal pain, other specified site   . Acute upper respiratory infections of unspecified site   . Anemia   . Benign neoplasm of skin, site unspecified   . Disturbance of skin sensation   . Diverticulosis of colon (without mention of hemorrhage)   . Family history of diabetes mellitus   . Family history of ischemic heart disease   . Family history of malignant neoplasm of genital organ, other   . GERD (gastroesophageal reflux disease)   . Hypertension   . Hypokalemia   . Left shoulder pain   . Low blood potassium   . Post-menopausal   . Rheumatoid arthritis ([place])   . Sprain and strain of unspecified site of knee and leg   . Sprain of neck   . Thyroid disease   . Unspecified essential hypertension     PAST SURGICAL HISTORY: Past Surgical History:  Procedure Laterality Date  . ABDOMINAL HYSTERECTOMY    . BUNIONECTOMY  2005   right foot  . SHOULDER SURGERY     left;spurs 2-09- dr Rhoderick Moody  . TOTAL ABDOMINAL HYSTERECTOMY W/ BILATERAL SALPINGOOPHORECTOMY  2002    FAMILY HISTORY: Family History  Problem  Relation Age of Onset  . Diabetes Mother   . Hypertension Mother   . Dementia Mother   . Stroke Mother   . Stroke Maternal Aunt   . Breast cancer Maternal Aunt   . Prostate cancer Father   . Hypertension Father   . Cancer Father   . Diabetes Brother   . Hypertension Sister   . Breast cancer Cousin   . Colon cancer Neg Hx     SOCIAL HISTORY: Social History   Socioeconomic History  . Marital status: Married    Spouse name: Not on file  . Number of children: 0  . Years of education: Not on file  . Highest education level: Not on file  Occupational History  . Occupation: Theatre manager  Employer: [employer]  Tobacco Use  . Smoking status: Never Smoker  . Smokeless tobacco: Never Used  Substance and Sexual Activity  . Alcohol use: No  . Drug use: No  . Sexual activity: Yes    Partners: Male  Other Topics Concern  . Not on file  Social History Narrative   Regular exercise- joined [employer]--- plans to go 5 days a week   Social Determinants of Health   Financial Resource Strain:   . Difficulty of Paying Living Expenses: Not on file  Food Insecurity:   . Worried About Charity fundraiser in the Last Year: Not on file  . Ran Out of Food in the Last Year: Not on file  Transportation Needs:   . Lack of Transportation (Medical): Not on file  . Lack of Transportation (Non-Medical): Not on file  Physical Activity:   . Days of Exercise per Week: Not on file  . Minutes of Exercise per Session: Not on file  Stress:   . Feeling of Stress : Not on file  Social Connections:   . Frequency of Communication with Friends and Family: Not on file  . Frequency of Social Gatherings with Friends and Family: Not on file  . Attends Religious Services: Not on file  . Active Member of Clubs or Organizations: Not on file  . Attends Archivist Meetings: Not on file  . Marital Status: Not on file  Intimate Partner Violence:   . Fear of Current or Ex-Partner: Not on file  .  Emotionally Abused: Not on file  . Physically Abused: Not on file  . Sexually Abused: Not on file      PHYSICAL EXAM  Vitals:   06/21/19 1124  BP: 124/70  Pulse: 80  Weight: 300 lb (136.1 kg)  Height: 5\' 8"  (1.727 m)   Body mass index is 45.61 kg/m.  Generalized: Well developed, in no acute distress  Chest: Lungs clear to auscultation bilaterally  Neurological examination  Mentation: Alert oriented to time, place, history taking. Follows all commands speech and language fluent Cranial nerve II-XII: Extraocular movements were full, visual field were full on confrontational test Head turning and shoulder shrug  were normal and symmetric. Motor: The motor testing reveals 5 over 5 strength of all 4 extremities. Good symmetric motor tone is noted throughout.  Sensory: Sensory testing is intact to soft touch on all 4 extremities. No evidence of extinction is noted.  Gait and station: Gait is normal.    DIAGNOSTIC DATA (LABS, IMAGING, TESTING) - I reviewed patient records, labs, notes, testing and imaging myself where available.  Lab Results  Component Value Date   WBC 4.5 02/21/2019   HGB 11.8 02/21/2019   HCT 35.4 02/21/2019   MCV 86 02/21/2019   PLT 183 02/21/2019      Component Value Date/Time   NA 142 02/21/2019 1045   K 3.9 02/21/2019 1045   CL 104 02/21/2019 1045   CO2 26 02/21/2019 1045   GLUCOSE 75 02/21/2019 1045   GLUCOSE 80 10/19/2018 0900   GLUCOSE 74 03/03/2006 1039   BUN 19 02/21/2019 1045   CREATININE 0.96 02/21/2019 1045   CALCIUM 8.9 02/21/2019 1045   PROT 6.5 02/21/2019 1045   ALBUMIN 3.7 (L) 02/21/2019 1045   AST 13 02/21/2019 1045   ALT 14 02/21/2019 1045   ALKPHOS 61 02/21/2019 1045   BILITOT 0.5 02/21/2019 1045   GFRNONAA 65 02/21/2019 1045   GFRAA 75 02/21/2019 1045   Lab Results  Component Value Date   CHOL 161 02/21/2019   HDL 67 02/21/2019   LDLCALC 81 02/21/2019   LDLDIRECT 128.5 04/30/2008   TRIG 66 02/21/2019   CHOLHDL 3  10/19/2018   Lab Results  Component Value Date   HGBA1C 5.4 02/21/2019   Lab Results  Component Value Date   VITAMINB12 673 02/21/2019   Lab Results  Component Value Date   TSH 1.850 02/21/2019      ASSESSMENT AND PLAN 59 y.o. year old female  has a past medical history of Abdominal pain, other specified site, Acute upper respiratory infections of unspecified site, Anemia, Benign neoplasm of skin, site unspecified, Disturbance of skin sensation, Diverticulosis of colon (without mention of hemorrhage), Family history of diabetes mellitus, Family history of ischemic heart disease, Family history of malignant neoplasm of genital organ, other, GERD (gastroesophageal reflux disease), Hypertension, Hypokalemia, Left shoulder pain, Low blood potassium, Post-menopausal, Rheumatoid arthritis ([place]), Sprain and strain of unspecified site of knee and leg, Sprain of neck, Thyroid disease, and Unspecified essential hypertension. here with:  1. OSA on CPAP  - CPAP compliance: Suboptimal - Good treatment of AHI when she is using the machine - Encourage patient to use CPAP nightly and > 4 hours each night -Mask refitting ordered due to high leakage - F/U in 1 year or sooner if needed   I spent 15 minutes of face-to-face and non-face-to-face time with patient.  This included previsit chart review, lab review, study review, order entry, electronic health record documentation, patient education.  Ward Givens, MSN, NP-C 06/21/2019, 11:19 AM [facility] [address] [place], [place] [address] [phone]

## 2019-06-21 NOTE — Patient Instructions (Signed)
Continue using CPAP nightly and greater than 4 hours each night Mask refitting ordered If your symptoms worsen or you develop new symptoms please let us know.   Please call [facility] at [phone], and press option 1 when prompted. Their customer service representatives will be glad to assist you. If they are unable to answer, please leave a message and they will call you back. Make sure to leave your name and return phone number.

## 2019-07-01 ENCOUNTER — Other Ambulatory Visit (INDEPENDENT_AMBULATORY_CARE_PROVIDER_SITE_OTHER): Payer: Self-pay | Admitting: Bariatrics

## 2019-07-12 ENCOUNTER — Other Ambulatory Visit: Payer: Self-pay

## 2019-07-12 ENCOUNTER — Ambulatory Visit (INDEPENDENT_AMBULATORY_CARE_PROVIDER_SITE_OTHER): Payer: Managed Care, Other (non HMO) | Admitting: Family Medicine

## 2019-07-12 ENCOUNTER — Encounter (INDEPENDENT_AMBULATORY_CARE_PROVIDER_SITE_OTHER): Payer: Self-pay | Admitting: Family Medicine

## 2019-07-12 VITALS — BP 104/56 | HR 70 | Temp 97.9°F | Ht 68.0 in | Wt 295.0 lb

## 2019-07-12 DIAGNOSIS — E8881 Metabolic syndrome: Secondary | ICD-10-CM | POA: Diagnosis not present

## 2019-07-12 DIAGNOSIS — R140 Abdominal distension (gaseous): Secondary | ICD-10-CM

## 2019-07-12 DIAGNOSIS — Z9189 Other specified personal risk factors, not elsewhere classified: Secondary | ICD-10-CM | POA: Diagnosis not present

## 2019-07-12 DIAGNOSIS — Z6841 Body Mass Index (BMI) 40.0 and over, adult: Secondary | ICD-10-CM

## 2019-07-12 DIAGNOSIS — E038 Other specified hypothyroidism: Secondary | ICD-10-CM

## 2019-07-12 DIAGNOSIS — E559 Vitamin D deficiency, unspecified: Secondary | ICD-10-CM

## 2019-07-12 NOTE — Progress Notes (Signed)
Chief Complaint:   OBESITY [name] is here to discuss her progress with her obesity treatment plan along with follow-up of her obesity related diagnoses. [name] is on the Category 2 Plan and keeping a food journal and adhering to recommended goals of 400-600 calories and 35 grams of protein daily and states she is following her eating plan approximately 50% of the time. [name] states she is walking and doing jumping jacks for 45 minutes 5 times per week.  Today's visit was #: 8 Starting weight: 297 lbs Starting date: 02/21/2019 Today's weight: 295 lbs Today's date: 07/12/2019 Total lbs lost to date: 2 Total lbs lost since last in-office visit: 0  Interim History: [name] is skipping meals less but she still admits to not eating protein at lunch. She gets vegetables from the cafeteria and no meat. She often has only 2 boiled eggs for dinner or skips it. She is deviating from Category 2 significantly and eats very little protein.  Subjective:   1. Bloating [name]'s bloating is improving. She is skipping less meals. She has had a hysterectomy and oophorectomy.   2. Vitamin D deficiency [name] is on prescription Vit D, but her level is not at goal.  3. Insulin resistance [name] denies polyphagia. She sometimes doesn't have an appetite at all.  4. Other specified hypothyroidism [name] is stable on Synthroid 50 mcg. She denies palpitations, but she notes cold intolerance.  5. At risk for malnutrition [name] is at increased risk for malnutrition due to lack of protein.   Assessment/Plan:   1. Bloating [name] will  Follow up with her primary care physician if bloating worsens.  2. Vitamin D deficiency Low Vitamin D level contributes to fatigue and are associated with obesity, breast, and colon cancer. [name] agreed to continue to take prescription Vitamin D and will follow-up for routine testing of Vitamin D, at least 2-3 times per year to avoid over-replacement. We will check labs  today.  3. Insulin resistance [name] will continue to work on weight loss, exercise, and decreasing simple carbohydrates to help decrease the risk of diabetes. We will check labs today. [name] agreed to follow-up with [place] as directed to closely monitor her progress.  4. Other specified hypothyroidism Patient with long-standing hypothyroidism, on levothyroxine therapy. She appears euthyroid. We will check labs today. Orders and follow up as documented in patient record.  5. At risk for malnutrition [name] was given approximately 15 minutes of counseling today regarding prevention of malnutrition. [name] was advised that having bariatric surgery increases her risk for anemia, malnutrition, and vitamin deficiencies.   6. Class 3 severe obesity with serious comorbidity and body mass index (BMI) of 40.0 to 44.9 in adult, unspecified obesity type [name]) [name] is currently in the action stage of change. As such, her goal is to continue with weight loss efforts. She has agreed to keeping a food journal and adhering to recommended goals of 1000-1100 calories and 75 grams of protein daily.   Handouts given today: Protein Content, Journaling, and Eating Out.  Exercise goals: As is.  Behavioral modification strategies: increasing lean protein intake, no skipping meals, planning for success and keeping a strict food journal.  [name] has agreed to follow-up with our clinic in 2 weeks. She was informed of the importance of frequent follow-up visits to maximize her success with intensive lifestyle modifications for her multiple health conditions.   Objective:   Blood pressure (!) 104/56, pulse 70, temperature 97.9 F (36.6 C), temperature source Oral, height  5\' 8"  (1.727 m), weight 295 lb (133.8 kg), SpO2 99 %. Body mass index is 44.85 kg/m.  General: Cooperative, alert, well developed, in no acute distress. HEENT: Conjunctivae and lids unremarkable. Cardiovascular: Regular rhythm.  Lungs: Normal work  of breathing. Neurologic: No focal deficits.   Lab Results  Component Value Date   CREATININE 0.96 02/21/2019   BUN 19 02/21/2019   NA 142 02/21/2019   K 3.9 02/21/2019   CL 104 02/21/2019   CO2 26 02/21/2019   Lab Results  Component Value Date   ALT 14 02/21/2019   AST 13 02/21/2019   ALKPHOS 61 02/21/2019   BILITOT 0.5 02/21/2019   Lab Results  Component Value Date   HGBA1C 5.4 02/21/2019   Lab Results  Component Value Date   INSULIN 8.2 02/21/2019   Lab Results  Component Value Date   TSH 1.850 02/21/2019   Lab Results  Component Value Date   CHOL 161 02/21/2019   HDL 67 02/21/2019   LDLCALC 81 02/21/2019   LDLDIRECT 128.5 04/30/2008   TRIG 66 02/21/2019   CHOLHDL 3 10/19/2018   Lab Results  Component Value Date   WBC 4.5 02/21/2019   HGB 11.8 02/21/2019   HCT 35.4 02/21/2019   MCV 86 02/21/2019   PLT 183 02/21/2019   Lab Results  Component Value Date   IRON 63 02/21/2019   TIBC 282 02/21/2019   FERRITIN 45 02/21/2019   Attestation Statements:   Reviewed by clinician on day of visit: allergies, medications, problem list, medical history, surgical history, family history, social history, and previous encounter notes.   Wilhemena Durie, am acting as Location manager for [employer], FNP-C.  I have reviewed the above documentation for accuracy and completeness, and I agree with the above. -  Georgianne Fick, FNP

## 2019-07-13 LAB — INSULIN, RANDOM: INSULIN: 8.2 u[IU]/mL (ref 2.6–24.9)

## 2019-07-13 LAB — COMPREHENSIVE METABOLIC PANEL
ALT: 13 IU/L (ref 0–32)
AST: 8 IU/L (ref 0–40)
Albumin/Globulin Ratio: 1.6 (ref 1.2–2.2)
Albumin: 3.8 g/dL (ref 3.8–4.9)
Alkaline Phosphatase: 59 IU/L (ref 39–117)
BUN/Creatinine Ratio: 18 (ref 9–23)
BUN: 17 mg/dL (ref 6–24)
Bilirubin Total: 0.5 mg/dL (ref 0.0–1.2)
CO2: 25 mmol/L (ref 20–29)
Calcium: 8.6 mg/dL — ABNORMAL LOW (ref 8.7–10.2)
Chloride: 106 mmol/L (ref 96–106)
Creatinine, Ser: 0.96 mg/dL (ref 0.57–1.00)
GFR calc Af Amer: 75 mL/min/{1.73_m2} (ref >59)
GFR calc non Af Amer: 65 mL/min/{1.73_m2} (ref >59)
Globulin, Total: 2.4 g/dL (ref 1.5–4.5)
Glucose: 83 mg/dL (ref 65–99)
Potassium: 4.1 mmol/L (ref 3.5–5.2)
Sodium: 143 mmol/L (ref 134–144)
Total Protein: 6.2 g/dL (ref 6.0–8.5)

## 2019-07-13 LAB — VITAMIN D 25 HYDROXY (VIT D DEFICIENCY, FRACTURES): Vit D, 25-Hydroxy: 56 ng/mL (ref 30.0–100.0)

## 2019-07-13 LAB — T3: T3, Total: 110 ng/dL (ref 71–180)

## 2019-07-13 LAB — T4, FREE: Free T4: 1.22 ng/dL (ref 0.82–1.77)

## 2019-07-13 LAB — HEMOGLOBIN A1C
Est. average glucose Bld gHb Est-mCnc: 91 mg/dL
Hgb A1c MFr Bld: 4.8 % (ref 4.8–5.6)

## 2019-07-13 LAB — TSH: TSH: 3.07 u[IU]/mL (ref 0.450–4.500)

## 2019-07-16 ENCOUNTER — Encounter (INDEPENDENT_AMBULATORY_CARE_PROVIDER_SITE_OTHER): Payer: Self-pay | Admitting: Family Medicine

## 2019-07-19 ENCOUNTER — Ambulatory Visit (AMBULATORY_SURGERY_CENTER): Payer: Self-pay | Admitting: *Deleted

## 2019-07-19 ENCOUNTER — Other Ambulatory Visit: Payer: Self-pay

## 2019-07-19 VITALS — Temp 96.9°F | Ht 68.0 in | Wt 298.0 lb

## 2019-07-19 DIAGNOSIS — Z1211 Encounter for screening for malignant neoplasm of colon: Secondary | ICD-10-CM

## 2019-07-19 MED ORDER — SUPREP BOWEL PREP KIT 17.5-3.13-1.6 GM/177ML PO SOLN: 1.0000 | Freq: Once | ORAL | 0 refills | Status: AC

## 2019-07-19 NOTE — Progress Notes (Signed)
Will complete covid vaccines 07-20-2019- will not need covid pre screen   No egg or soy allergy known to patient  No issues with past sedation with any surgeries  or procedures, no intubation problems  No diet pills per patient No home 02 use per patient  No blood thinners per patient  Pt denies issues with constipation  No A fib or A flutter  EMMI video sent to pt's e mail   Due to the COVID-19 pandemic we are asking patients to follow these guidelines. Please only bring one care partner. Please be aware that your care partner may wait in the car in the parking lot or if they feel like they will be too hot to wait in the car, they may wait in the lobby on the 4th floor. All care partners are required to wear a mask the entire time (we do not have any that we can provide them), they need to practice social distancing, and we will do a Covid check for all patient's and care partners when you arrive. Also we will check their temperature and your temperature. If the care partner waits in their car they need to stay in the parking lot the entire time and we will call them on their cell phone when the patient is ready for discharge so they can bring the car to the front of the building. Also all patient's will need to wear a mask into building. suprep code to pharmacy and coupon to pt

## 2019-08-01 ENCOUNTER — Encounter: Payer: Self-pay | Admitting: Gastroenterology

## 2019-08-01 ENCOUNTER — Ambulatory Visit (INDEPENDENT_AMBULATORY_CARE_PROVIDER_SITE_OTHER): Payer: Managed Care, Other (non HMO) | Admitting: Family Medicine

## 2019-08-03 ENCOUNTER — Ambulatory Visit (AMBULATORY_SURGERY_CENTER): Payer: Managed Care, Other (non HMO) | Admitting: Gastroenterology

## 2019-08-03 ENCOUNTER — Encounter: Payer: Self-pay | Admitting: Gastroenterology

## 2019-08-03 ENCOUNTER — Other Ambulatory Visit: Payer: Self-pay

## 2019-08-03 VITALS — BP 143/75 | HR 67 | Temp 97.1°F | Resp 16 | Ht 68.0 in | Wt 298.0 lb

## 2019-08-03 DIAGNOSIS — Z1211 Encounter for screening for malignant neoplasm of colon: Secondary | ICD-10-CM

## 2019-08-03 DIAGNOSIS — K5730 Diverticulosis of large intestine without perforation or abscess without bleeding: Secondary | ICD-10-CM

## 2019-08-03 DIAGNOSIS — D123 Benign neoplasm of transverse colon: Secondary | ICD-10-CM | POA: Diagnosis not present

## 2019-08-03 DIAGNOSIS — D122 Benign neoplasm of ascending colon: Secondary | ICD-10-CM

## 2019-08-03 MED ORDER — SODIUM CHLORIDE 0.9 % IV SOLN: 500.0000 mL | Freq: Once | INTRAVENOUS | Status: DC

## 2019-08-03 NOTE — Patient Instructions (Signed)
YOU HAD AN ENDOSCOPIC PROCEDURE TODAY AT [facility]:   Refer to the procedure report that was given to you for any specific questions about what was found during the examination.  If the procedure report does not answer your questions, please call your gastroenterologist to clarify.  If you requested that your care partner not be given the details of your procedure findings, then the procedure report has been included in a sealed envelope for you to review at your convenience later.  YOU SHOULD EXPECT: Some feelings of bloating in the abdomen. Passage of more gas than usual.  Walking can help get rid of the air that was put into your GI tract during the procedure and reduce the bloating. If you had a lower endoscopy (such as a colonoscopy or flexible sigmoidoscopy) you may notice spotting of blood in your stool or on the toilet paper. If you underwent a bowel prep for your procedure, you may not have a normal bowel movement for a few days.  Please Note:  You might notice some irritation and congestion in your nose or some drainage.  This is from the oxygen used during your procedure.  There is no need for concern and it should clear up in a day or so.  SYMPTOMS TO REPORT IMMEDIATELY:   Following lower endoscopy (colonoscopy or flexible sigmoidoscopy):  Excessive amounts of blood in the stool  Significant tenderness or worsening of abdominal pains  Swelling of the abdomen that is new, acute  Fever of 100F or higher    For urgent or emergent issues, a gastroenterologist can be reached at any hour by calling [phone]. Do not use MyChart messaging for urgent concerns.    DIET:  We do recommend a small meal at first, but then you may proceed to your regular diet.  Drink plenty of fluids but you should avoid alcoholic beverages for 24 hours.  ACTIVITY:  You should plan to take it easy for the rest of today and you should NOT DRIVE or use heavy machinery until tomorrow  (because of the sedation medicines used during the test).    FOLLOW UP: Our staff will call the number listed on your records 48-72 hours following your procedure to check on you and address any questions or concerns that you may have regarding the information given to you following your procedure. If we do not reach you, we will leave a message.  We will attempt to reach you two times.  During this call, we will ask if you have developed any symptoms of COVID 19. If you develop any symptoms (ie: fever, flu-like symptoms, shortness of breath, cough etc.) before then, please call [phone].  If you test positive for Covid 19 in the 2 weeks post procedure, please call and report this information to [place].    If any biopsies were taken you will be contacted by phone or by letter within the next 1-3 weeks.  Please call us at [phone] if you have not heard about the biopsies in 3 weeks.    SIGNATURES/CONFIDENTIALITY: You and/or your care partner have signed paperwork which will be entered into your electronic medical record.  These signatures attest to the fact that that the information above on your After Visit Summary has been reviewed and is understood.  Full responsibility of the confidentiality of this discharge information lies with you and/or your care-partner.   Resume medications. Information given  On polyps,diverticulosis.

## 2019-08-03 NOTE — Progress Notes (Signed)
PT taken to PACU. Monitors in place. VSS. Report given to RN. 

## 2019-08-03 NOTE — Op Note (Signed)
[place] Patient Name: [name] Procedure Date: 08/03/2019 8:34 AM MRN: [id number] Endoscopist: Gerrit Heck , MD Age: 59 Referring MD:  Date of Birth: [birth year] Gender: Female Account #: [id number] Procedure:                Colonoscopy Indications:              Screening for colorectal malignant neoplasm (last                            colonoscopy was more than 10 years ago)                           Last colonoscopy was 09/2008 and notable for                            diverticulosis, without polyps, with recommendation                            to repeat in 10 years. Otherwise, no known FHx of                            CRC and no lower GI symptoms. Medicines:                Monitored Anesthesia Care Procedure:                Pre-Anesthesia Assessment:                           - Prior to the procedure, a History and Physical                            was performed, and patient medications and                            allergies were reviewed. The patient's tolerance of                            previous anesthesia was also reviewed. The risks                            and benefits of the procedure and the sedation                            options and risks were discussed with the patient.                            All questions were answered, and informed consent                            was obtained. Prior Anticoagulants: The patient has                            taken no previous anticoagulant or antiplatelet  agents. ASA Grade Assessment: III - A patient with                            severe systemic disease. After reviewing the risks                            and benefits, the patient was deemed in                            satisfactory condition to undergo the procedure.                           After obtaining informed consent, the colonoscope                            was passed under direct vision. Throughout  the                            procedure, the patient's blood pressure, pulse, and                            oxygen saturations were monitored continuously. The                            Colonoscope was introduced through the anus and                            advanced to the the cecum, identified by                            appendiceal orifice and ileocecal valve. The                            colonoscopy was performed without difficulty. The                            patient tolerated the procedure well. The quality                            of the bowel preparation was excellent. The                            ileocecal valve, appendiceal orifice, and rectum                            were photographed. Scope In: 8:39:41 AM Scope Out: 8:59:40 AM Scope Withdrawal Time: 0 hours 18 minutes 7 seconds  Total Procedure Duration: 0 hours 19 minutes 59 seconds  Findings:                 The perianal and digital rectal examinations were                            normal.  Two sessile polyps were found in the transverse                            colon and ascending colon. The polyps were 2 to 4                            mm in size. These polyps were removed with a cold                            snare. Resection and retrieval were complete.                            Estimated blood loss was minimal.                           Multiple small and large-mouthed diverticula were                            found in the sigmoid colon, transverse colon and                            ascending colon.                           The retroflexed view of the distal rectum and anal                            verge was normal and showed no anal or rectal                            abnormalities. Complications:            No immediate complications. Estimated Blood Loss:     Estimated blood loss was minimal. Impression:               - Two 2 to 4 mm polyps in the transverse  colon and                            in the ascending colon, removed with a cold snare.                            Resected and retrieved.                           - Diverticulosis in the sigmoid colon, in the                            transverse colon and in the ascending colon.                           - The distal rectum and anal verge are normal on                            retroflexion view. Recommendation:           -  Patient has a contact number available for                            emergencies. The signs and symptoms of potential                            delayed complications were discussed with the                            patient. Return to normal activities tomorrow.                            Written discharge instructions were provided to the                            patient.                           - Resume previous diet.                           - Continue present medications.                           - Await pathology results.                           - Repeat colonoscopy in 5-10 years for surveillance                            based on pathology results.                           - Return to GI clinic PRN. Gerrit Heck, MD 08/03/2019 9:04:43 AM

## 2019-08-03 NOTE — Progress Notes (Signed)
Temp-JB VS-CW  Pt's states no medical or surgical changes since previsit or office visit.  

## 2019-08-07 ENCOUNTER — Telehealth: Payer: Self-pay

## 2019-08-07 NOTE — Telephone Encounter (Signed)
Left message on answering machine. 

## 2019-08-07 NOTE — Telephone Encounter (Signed)
Attempted to reach patient for post-procedure f/u call. No answer. Left message for her to please not hesitate to call us if she has any questions/concerns regarding her care. 

## 2019-08-10 ENCOUNTER — Encounter: Payer: Self-pay | Admitting: Gastroenterology

## 2019-08-13 ENCOUNTER — Encounter (INDEPENDENT_AMBULATORY_CARE_PROVIDER_SITE_OTHER): Payer: Self-pay | Admitting: Family Medicine

## 2019-08-13 ENCOUNTER — Ambulatory Visit (INDEPENDENT_AMBULATORY_CARE_PROVIDER_SITE_OTHER): Payer: Managed Care, Other (non HMO) | Admitting: Family Medicine

## 2019-08-13 ENCOUNTER — Other Ambulatory Visit: Payer: Self-pay

## 2019-08-13 VITALS — BP 113/70 | HR 68 | Temp 98.0°F | Ht 68.0 in | Wt 295.0 lb

## 2019-08-13 DIAGNOSIS — Z9189 Other specified personal risk factors, not elsewhere classified: Secondary | ICD-10-CM

## 2019-08-13 DIAGNOSIS — I10 Essential (primary) hypertension: Secondary | ICD-10-CM | POA: Diagnosis not present

## 2019-08-13 DIAGNOSIS — Z6841 Body Mass Index (BMI) 40.0 and over, adult: Secondary | ICD-10-CM

## 2019-08-13 DIAGNOSIS — E559 Vitamin D deficiency, unspecified: Secondary | ICD-10-CM

## 2019-08-13 MED ORDER — LOSARTAN POTASSIUM 100 MG PO TABS: 100.0000 mg | ORAL_TABLET | Freq: Every day | ORAL | 0 refills | Status: DC

## 2019-08-13 MED ORDER — HYDROCHLOROTHIAZIDE 25 MG PO TABS: 25.0000 mg | ORAL_TABLET | Freq: Every day | ORAL | 0 refills | Status: DC

## 2019-08-14 NOTE — Progress Notes (Signed)
Chief Complaint:   OBESITY [name] is here to discuss her progress with her obesity treatment plan along with follow-up of her obesity related diagnoses. [name] is on keeping a food journal and adhering to recommended goals of 1000-1100 calories and 75 grams of protein daily and states she is following her eating plan approximately 75% of the time. [name] states she is walking and doing jumping jacks for 45 minutes 5 times per week.  Today's visit was #: 9 Starting weight: 297 lbs Starting date: 02/21/2019 Today's weight: 295 lbs Today's date: 08/13/2019 Total lbs lost to date: 2 Total lbs lost since last in-office visit: 0  Interim History: [name] is now eating protein at lunch. She is not journaling yet. She is skipping protein at dinner on days she works late and having only fruit. She denies excessive snacking, but she is skipping meals consistently- sometimes 2 out of 3 meals on the weekends.   Subjective:   1. Vitamin D deficiency [name]'s last Vit D level was at goal. She is maintained on 2,000 IU OTC Vit D3. She denies fatigue.  2. Essential hypertension [name]'s blood pressure is well controlled today on hydrochlorothiazide, losartan, and Norvasc. She has had some elevated readings recently. BP Readings from Last 3 Encounters:  08/13/19 113/70  08/03/19 (!) 143/75  07/12/19 (!) 104/56    3. At risk for deficient intake of food The patient is at a higher than average risk of deficient intake of food due to meal skipping and lack of protein.  Assessment/Plan:   1. Vitamin D deficiency Low Vitamin D level contributes to fatigue and are associated with obesity, breast, and colon cancer. [name] agreed to continue OTC Vit D3 2,000 IU daily and will follow-up for routine testing of Vitamin D, at least 2-3 times per year to avoid over-replacement.  2. Essential hypertension [name] is working on healthy weight loss and exercise to improve blood pressure control. We will  watch for signs of hypotension as she continues her lifestyle modifications. We will refill hydrochlorothiazide and losartan for 1 month.  - hydrochlorothiazide (HYDRODIURIL) 25 MG tablet; Take 1 tablet (25 mg total) by mouth daily.  Dispense: 30 tablet; Refill: 0 - losartan (COZAAR) 100 MG tablet; Take 1 tablet (100 mg total) by mouth daily.  Dispense: 30 tablet; Refill: 0  3. At risk for deficient intake of food [name] was given approximately 15 minutes of deficit intake of food prevention counseling today. [name] is at risk for eating too few calories based on current food recall. She was encouraged to focus on meeting caloric and protein goals according to her recommended meal plan.   4. Class 3 severe obesity with serious comorbidity and body mass index (BMI) of 40.0 to 44.9 in adult, unspecified obesity type [facility]) [name] is currently in the action stage of change. As such, her goal is to continue with weight loss efforts. She has agreed to keeping a food journal and adhering to recommended goals of 1000-1100 calories and 75 grams of protein daily.   We discussed starting to use MyFitness Pal.  Exercise goals: As is.  Behavioral modification strategies: increasing lean protein intake, no skipping meals and keeping a strict food journal.  [name] has agreed to follow-up with our clinic in 2 to 3 weeks. She was informed of the importance of frequent follow-up visits to maximize her success with intensive lifestyle modifications for her multiple health conditions.   Objective:   Blood pressure 113/70, pulse 68, temperature 98  F (36.7 C), temperature source Oral, height 5\' 8"  (1.727 m), weight 295 lb (133.8 kg), SpO2 98 %. Body mass index is 44.85 kg/m.  General: Cooperative, alert, well developed, in no acute distress. HEENT: Conjunctivae and lids unremarkable. Cardiovascular: Regular rhythm.  Lungs: Normal work of breathing. Neurologic: No focal deficits.   Lab Results  Component  Value Date   CREATININE 0.96 07/12/2019   BUN 17 07/12/2019   NA 143 07/12/2019   K 4.1 07/12/2019   CL 106 07/12/2019   CO2 25 07/12/2019   Lab Results  Component Value Date   ALT 13 07/12/2019   AST 8 07/12/2019   ALKPHOS 59 07/12/2019   BILITOT 0.5 07/12/2019   Lab Results  Component Value Date   HGBA1C 4.8 07/12/2019   HGBA1C 5.4 02/21/2019   Lab Results  Component Value Date   INSULIN 8.2 07/12/2019   INSULIN 8.2 02/21/2019   Lab Results  Component Value Date   TSH 3.070 07/12/2019   Lab Results  Component Value Date   CHOL 161 02/21/2019   HDL 67 02/21/2019   LDLCALC 81 02/21/2019   LDLDIRECT 128.5 04/30/2008   TRIG 66 02/21/2019   CHOLHDL 3 10/19/2018   Lab Results  Component Value Date   WBC 4.5 02/21/2019   HGB 11.8 02/21/2019   HCT 35.4 02/21/2019   MCV 86 02/21/2019   PLT 183 02/21/2019   Lab Results  Component Value Date   IRON 63 02/21/2019   TIBC 282 02/21/2019   FERRITIN 45 02/21/2019   Attestation Statements:   Reviewed by clinician on day of visit: allergies, medications, problem list, medical history, surgical history, family history, social history, and previous encounter notes.   Wilhemena Durie, am acting as Location manager for [employer], FNP-C.  I have reviewed the above documentation for accuracy and completeness, and I agree with the above. -  Georgianne Fick, FNP

## 2019-08-15 ENCOUNTER — Encounter (INDEPENDENT_AMBULATORY_CARE_PROVIDER_SITE_OTHER): Payer: Self-pay | Admitting: Family Medicine

## 2019-08-29 ENCOUNTER — Encounter (INDEPENDENT_AMBULATORY_CARE_PROVIDER_SITE_OTHER): Payer: Self-pay | Admitting: Family Medicine

## 2019-08-29 ENCOUNTER — Ambulatory Visit (INDEPENDENT_AMBULATORY_CARE_PROVIDER_SITE_OTHER): Payer: Managed Care, Other (non HMO) | Admitting: Family Medicine

## 2019-08-29 ENCOUNTER — Other Ambulatory Visit: Payer: Self-pay

## 2019-08-29 VITALS — BP 105/68 | HR 75 | Temp 98.1°F | Ht 68.0 in | Wt 294.0 lb

## 2019-08-29 DIAGNOSIS — E8881 Metabolic syndrome: Secondary | ICD-10-CM

## 2019-08-29 DIAGNOSIS — Z6841 Body Mass Index (BMI) 40.0 and over, adult: Secondary | ICD-10-CM

## 2019-08-29 NOTE — Progress Notes (Signed)
Chief Complaint:   OBESITY [name] is here to discuss her progress with her obesity treatment plan along with follow-up of her obesity related diagnoses. [name] is on keeping a food journal and adhering to recommended goals of 1000-1100 calories and 75 grams of protein daily and states she is following her eating plan approximately 50% of the time. [name] states she is doing 0 minutes 0 times per week.  Today's visit was #: 10 Starting weight: 297 lbs Starting date: 02/21/2019 Today's weight: 294 lbs Today's date: 08/29/2019 Total lbs lost to date: 3 Total lbs lost since last in-office visit: 1  Interim History: [name] reports sticking to the plan better and eating more protein. She has been doing more journaling. She is has reduced meal skipping. She is averaging 50-90 grams of protein per day and she is journaling daily.  Subjective:   1. Insulin resistance [name] has no hunger and she tends to skip meals. She is not on metformin. Lab Results  Component Value Date   HGBA1C 4.8 07/12/2019    Assessment/Plan:   1. Insulin resistance [name] will continue her meal plan, and will continue to work on weight loss, exercise, and decreasing simple carbohydrates to help decrease the risk of diabetes. [name] agreed to follow-up with [place] as directed to closely monitor her progress.  2. Class 3 severe obesity with serious comorbidity and body mass index (BMI) of 40.0 to 44.9 in adult, unspecified obesity type [name]) [name] is currently in the action stage of change. As such, her goal is to continue with weight loss efforts. She has agreed to keeping a food journal and adhering to recommended goals of 1000-1100 calories and 75 grams of protein daily.   I setup goals in MyFitness Pal for her and demonstrated some of the functions.  Exercise goals: [name] will continue physical therapy for her knee injury.  Behavioral modification strategies: increasing lean protein intake, no skipping meals  and keeping a strict food journal.  [name] has agreed to follow-up with our clinic in 3 weeks. She was informed of the importance of frequent follow-up visits to maximize her success with intensive lifestyle modifications for her multiple health conditions.   Objective:   Blood pressure 105/68, pulse 75, temperature 98.1 F (36.7 C), temperature source Oral, height 5\' 8"  (1.727 m), weight 294 lb (133.4 kg), SpO2 99 %. Body mass index is 44.7 kg/m.  General: Cooperative, alert, well developed, in no acute distress. HEENT: Conjunctivae and lids unremarkable. Cardiovascular: Regular rhythm.  Lungs: Normal work of breathing. Neurologic: No focal deficits.   Lab Results  Component Value Date   CREATININE 0.96 07/12/2019   BUN 17 07/12/2019   NA 143 07/12/2019   K 4.1 07/12/2019   CL 106 07/12/2019   CO2 25 07/12/2019   Lab Results  Component Value Date   ALT 13 07/12/2019   AST 8 07/12/2019   ALKPHOS 59 07/12/2019   BILITOT 0.5 07/12/2019   Lab Results  Component Value Date   HGBA1C 4.8 07/12/2019   HGBA1C 5.4 02/21/2019   Lab Results  Component Value Date   INSULIN 8.2 07/12/2019   INSULIN 8.2 02/21/2019   Lab Results  Component Value Date   TSH 3.070 07/12/2019   Lab Results  Component Value Date   CHOL 161 02/21/2019   HDL 67 02/21/2019   LDLCALC 81 02/21/2019   LDLDIRECT 128.5 04/30/2008   TRIG 66 02/21/2019   CHOLHDL 3 10/19/2018   Lab Results  Component Value  Date   WBC 4.5 02/21/2019   HGB 11.8 02/21/2019   HCT 35.4 02/21/2019   MCV 86 02/21/2019   PLT 183 02/21/2019   Lab Results  Component Value Date   IRON 63 02/21/2019   TIBC 282 02/21/2019   FERRITIN 45 02/21/2019   Attestation Statements:   Reviewed by clinician on day of visit: allergies, medications, problem list, medical history, surgical history, family history, social history, and previous encounter notes.   Wilhemena Durie, am acting as Location manager for [employer],  FNP-C.  I have reviewed the above documentation for accuracy and completeness, and I agree with the above. -  Georgianne Fick, FNP

## 2019-08-30 ENCOUNTER — Encounter (INDEPENDENT_AMBULATORY_CARE_PROVIDER_SITE_OTHER): Payer: Self-pay | Admitting: Family Medicine

## 2019-09-13 ENCOUNTER — Other Ambulatory Visit: Payer: Self-pay | Admitting: Family Medicine

## 2019-09-13 DIAGNOSIS — I10 Essential (primary) hypertension: Secondary | ICD-10-CM

## 2019-09-19 ENCOUNTER — Ambulatory Visit (INDEPENDENT_AMBULATORY_CARE_PROVIDER_SITE_OTHER): Payer: Managed Care, Other (non HMO) | Admitting: Family Medicine

## 2019-09-19 ENCOUNTER — Encounter (INDEPENDENT_AMBULATORY_CARE_PROVIDER_SITE_OTHER): Payer: Self-pay | Admitting: Family Medicine

## 2019-09-19 ENCOUNTER — Other Ambulatory Visit: Payer: Self-pay

## 2019-09-19 VITALS — BP 122/76 | HR 65 | Temp 98.5°F | Ht 68.0 in | Wt 300.0 lb

## 2019-09-19 DIAGNOSIS — Z6841 Body Mass Index (BMI) 40.0 and over, adult: Secondary | ICD-10-CM

## 2019-09-19 DIAGNOSIS — G4733 Obstructive sleep apnea (adult) (pediatric): Secondary | ICD-10-CM | POA: Insufficient documentation

## 2019-09-19 NOTE — Progress Notes (Signed)
Chief Complaint:   OBESITY Layza is here to discuss her progress with her obesity treatment plan along with follow-up of her obesity related diagnoses. Haniah is on keeping a food journal and adhering to recommended goals of 1000-1100 calories and 75 grams of protein daily and states she is following her eating plan approximately 50% of the time. Katharin states she is doing 0 minutes 0 times per week.  Today's visit was #: 11 Starting weight: 297 lbs Starting date: 02/21/2019 Today's weight: 300 lbs Today's date: 09/19/2019 Total lbs lost to date: 0 Total lbs lost since last in-office visit: 0  Interim History: Sonaya is attending physical therapy for right knee injury. She reports journaling only on weekends. She is not skipping meals but she is deviating from plan in food choices. For breakfast she has 2 eggs with grated cheese, 3 slices of regular bacon, and coffee with half and half. She buys lunch at work: meat and vegetables (sometimes with rice). For supper she has a sandwich with a little bit of lunch meat (not 4oz). For snack she has 160 calorie trail mix.  Subjective:   1. OSA (obstructive sleep apnea) Carlina is wearing CPAP for 7 hours at night. She reports less daytime sleepiness.  Assessment/Plan:   1. OSA (obstructive sleep apnea) Intensive lifestyle modifications are the first line treatment for this issue. We discussed several lifestyle modifications today and she will continue to work on diet, exercise and weight loss efforts. Ambriel will continue CPAP therapy.  2. Class 3 severe obesity with serious comorbidity and body mass index (BMI) of 45.0 to 49.9 in adult, unspecified obesity type [facility]) Sivana is currently in the action stage of change. As such, her goal is to continue with weight loss efforts. She has agreed to the Category 1 Plan.  She will switch diner and lunch meals since she usually has a sandwich for dinner.  She will add a snack from the protein  equivalent list and have 2 oz of meat on her sandwich.   Handouts given today: Category 1 meal plan, Protein Equivalents, and Breakfast Options. Neda is to have [place] bacon rather than regular bacon.  Exercise goals: No exercise has been prescribed at this time.  Behavioral modification strategies: increasing lean protein intake, decreasing simple carbohydrates and planning for success.  Sumayya has agreed to follow-up with our clinic in 2 to 3 weeks. She was informed of the importance of frequent follow-up visits to maximize her success with intensive lifestyle modifications for her multiple health conditions.   Objective:   Blood pressure 122/76, pulse 65, temperature 98.5 F (36.9 C), temperature source Oral, height 5\' 8"  (1.727 m), weight 300 lb (136.1 kg), SpO2 97 %. Body mass index is 45.61 kg/m.  General: Cooperative, alert, well developed, in no acute distress. HEENT: Conjunctivae and lids unremarkable. Cardiovascular: Regular rhythm.  Lungs: Normal work of breathing. Neurologic: No focal deficits.   Lab Results  Component Value Date   CREATININE 0.96 07/12/2019   BUN 17 07/12/2019   NA 143 07/12/2019   K 4.1 07/12/2019   CL 106 07/12/2019   CO2 25 07/12/2019   Lab Results  Component Value Date   ALT 13 07/12/2019   AST 8 07/12/2019   ALKPHOS 59 07/12/2019   BILITOT 0.5 07/12/2019   Lab Results  Component Value Date   HGBA1C 4.8 07/12/2019   HGBA1C 5.4 02/21/2019   Lab Results  Component Value Date   INSULIN 8.2 07/12/2019  INSULIN 8.2 02/21/2019   Lab Results  Component Value Date   TSH 3.070 07/12/2019   Lab Results  Component Value Date   CHOL 161 02/21/2019   HDL 67 02/21/2019   LDLCALC 81 02/21/2019   LDLDIRECT 128.5 04/30/2008   TRIG 66 02/21/2019   CHOLHDL 3 10/19/2018   Lab Results  Component Value Date   WBC 4.5 02/21/2019   HGB 11.8 02/21/2019   HCT 35.4 02/21/2019   MCV 86 02/21/2019   PLT 183 02/21/2019   Lab Results    Component Value Date   IRON 63 02/21/2019   TIBC 282 02/21/2019   FERRITIN 45 02/21/2019   Attestation Statements:   Reviewed by clinician on day of visit: allergies, medications, problem list, medical history, surgical history, family history, social history, and previous encounter notes.   Wilhemena Durie, am acting as Location manager for [employer], FNP-C.  I have reviewed the above documentation for accuracy and completeness, and I agree with the above. -  Georgianne Fick, FNP

## 2019-10-01 ENCOUNTER — Other Ambulatory Visit: Payer: Self-pay | Admitting: Family Medicine

## 2019-10-01 DIAGNOSIS — R1013 Epigastric pain: Secondary | ICD-10-CM

## 2019-10-09 ENCOUNTER — Encounter (INDEPENDENT_AMBULATORY_CARE_PROVIDER_SITE_OTHER): Payer: Self-pay | Admitting: Family Medicine

## 2019-10-09 ENCOUNTER — Other Ambulatory Visit: Payer: Self-pay

## 2019-10-09 ENCOUNTER — Ambulatory Visit (INDEPENDENT_AMBULATORY_CARE_PROVIDER_SITE_OTHER): Payer: Managed Care, Other (non HMO) | Admitting: Family Medicine

## 2019-10-09 VITALS — BP 110/71 | HR 70 | Temp 98.0°F | Ht 68.0 in | Wt 299.0 lb

## 2019-10-09 DIAGNOSIS — I10 Essential (primary) hypertension: Secondary | ICD-10-CM

## 2019-10-09 DIAGNOSIS — Z6841 Body Mass Index (BMI) 40.0 and over, adult: Secondary | ICD-10-CM

## 2019-10-11 NOTE — Progress Notes (Signed)
Chief Complaint:   OBESITY [name] is here to discuss her progress with her obesity treatment plan along with follow-up of her obesity related diagnoses. [name] is on the Category 1 Plan and states she is following her eating plan approximately 95% of the time. [name] states she is walking for 30 minutes 5 times per week.  Today's visit was #: 12 Starting weight: 297 lbs Starting date: 02/21/2019 Today's weight: 299 lbs Today's date: 10/09/2019 Total lbs lost to date: 0 Total lbs lost since last in-office visit: 1  Interim History: [name] is eating all of the food on the plan. She is switching lunch and dinner. She does eat out at lunch on Saturday and makes good choices. She is eating [employer] in the evening, [employer] sandwiches which are > 700 calories.  Subjective:   1. Essential hypertension [name]'s blood pressure is well controlled on Norvasc and hydrochlorothiazide. Cardiovascular ROS: no chest pain or dyspnea on exertion.  BP Readings from Last 3 Encounters:  10/09/19 110/71  09/19/19 122/76  08/29/19 105/68   Lab Results  Component Value Date   CREATININE 0.96 07/12/2019   CREATININE 0.96 02/21/2019   CREATININE 0.94 10/19/2018   Assessment/Plan:   1. Essential hypertension [name] will continue her meal plan, and will continue working on healthy weight loss and exercise to improve blood pressure control. We will watch for signs of hypotension as she continues her lifestyle modifications.  2. Class 3 severe obesity with serious comorbidity and body mass index (BMI) of 45.0 to 49.9 in adult, unspecified obesity type [facility]) [name] is currently in the action stage of change. As such, her goal is to continue with weight loss efforts. She has agreed to the Category 1 Plan.   Handout given today: Lunch Options.  [name] will make a sandwich or salad at home for dinner. She will decrease eggs from 3 to 2 at breakfast.   Exercise goals: As is.  Behavioral  modification strategies: decreasing simple carbohydrates, decreasing liquid calories and meal planning and cooking strategies.  [name] has agreed to follow-up with our clinic in 3 weeks. She was informed of the importance of frequent follow-up visits to maximize her success with intensive lifestyle modifications for her multiple health conditions.   Objective:   Blood pressure 110/71, pulse 70, temperature 98 F (36.7 C), temperature source Oral, height 5\' 8"  (1.727 m), weight 299 lb (135.6 kg), SpO2 98 %. Body mass index is 45.46 kg/m.  General: Cooperative, alert, well developed, in no acute distress. HEENT: Conjunctivae and lids unremarkable. Cardiovascular: Regular rhythm.  Lungs: Normal work of breathing. Neurologic: No focal deficits.   Lab Results  Component Value Date   CREATININE 0.96 07/12/2019   BUN 17 07/12/2019   NA 143 07/12/2019   K 4.1 07/12/2019   CL 106 07/12/2019   CO2 25 07/12/2019   Lab Results  Component Value Date   ALT 13 07/12/2019   AST 8 07/12/2019   ALKPHOS 59 07/12/2019   BILITOT 0.5 07/12/2019   Lab Results  Component Value Date   HGBA1C 4.8 07/12/2019   HGBA1C 5.4 02/21/2019   Lab Results  Component Value Date   INSULIN 8.2 07/12/2019   INSULIN 8.2 02/21/2019   Lab Results  Component Value Date   TSH 3.070 07/12/2019   Lab Results  Component Value Date   CHOL 161 02/21/2019   HDL 67 02/21/2019   LDLCALC 81 02/21/2019   LDLDIRECT 128.5 04/30/2008   TRIG 66 02/21/2019  CHOLHDL 3 10/19/2018   Lab Results  Component Value Date   WBC 4.5 02/21/2019   HGB 11.8 02/21/2019   HCT 35.4 02/21/2019   MCV 86 02/21/2019   PLT 183 02/21/2019   Lab Results  Component Value Date   IRON 63 02/21/2019   TIBC 282 02/21/2019   FERRITIN 45 02/21/2019   Attestation Statements:   Reviewed by clinician on day of visit: allergies, medications, problem list, medical history, surgical history, family history, social history, and previous  encounter notes.   Wilhemena Durie, am acting as Location manager for [employer], FNP-C.  I have reviewed the above documentation for accuracy and completeness, and I agree with the above. -  Georgianne Fick, FNP

## 2019-10-30 ENCOUNTER — Encounter (INDEPENDENT_AMBULATORY_CARE_PROVIDER_SITE_OTHER): Payer: Self-pay | Admitting: Family Medicine

## 2019-10-30 ENCOUNTER — Ambulatory Visit (INDEPENDENT_AMBULATORY_CARE_PROVIDER_SITE_OTHER): Payer: Managed Care, Other (non HMO) | Admitting: Family Medicine

## 2019-10-30 ENCOUNTER — Other Ambulatory Visit: Payer: Self-pay

## 2019-10-30 VITALS — BP 114/73 | HR 75 | Temp 98.1°F | Ht 68.0 in | Wt 298.0 lb

## 2019-10-30 DIAGNOSIS — Z6841 Body Mass Index (BMI) 40.0 and over, adult: Secondary | ICD-10-CM | POA: Diagnosis not present

## 2019-10-30 DIAGNOSIS — I10 Essential (primary) hypertension: Secondary | ICD-10-CM

## 2019-11-01 ENCOUNTER — Encounter (INDEPENDENT_AMBULATORY_CARE_PROVIDER_SITE_OTHER): Payer: Self-pay | Admitting: Family Medicine

## 2019-11-01 NOTE — Progress Notes (Signed)
Chief Complaint:   OBESITY [name] is here to discuss her progress with her obesity treatment plan along with follow-up of her obesity related diagnoses. [name] is on the Category 1 Plan and states she is following her eating plan approximately 20-80% of the time. [name] states she is walking for 30 minutes 5 times per week.  Today's visit was #: 13 Starting weight: 297 lbs Starting date: 02/21/2019 Today's weight: 298 lbs Today's date: 10/30/2019 Total lbs lost to date: 0 Total lbs lost since last in-office visit: 1  Interim History: [name] is adhering to the plan better during the week than on the weekends. She skips 2 out of 3 meals on the weekend- breakfast and lunch. She has popcorn and 1 meal per day on the weekend. Monday thru Friday she has dinner for lunch and lunch for dinner because she eats at the cafeteria at work. She had been eating a Research scientist (physical sciences) sandwich from [employer] for diner but has started to make her own sandwich at home.   Subjective:   1. Essential hypertension [name]'s blood pressure is well controlled on Norvasc, hydrochlorothiazide, and losartan. She takes Lasix every 3 days as needed for swelling. Cardiovascular ROS: no chest pain or dyspnea on exertion.   BP Readings from Last 3 Encounters:  10/30/19 114/73  10/09/19 110/71  09/19/19 122/76   Lab Results  Component Value Date   CREATININE 0.96 07/12/2019   CREATININE 0.96 02/21/2019   CREATININE 0.94 10/19/2018   Assessment/Plan:   1. Essential hypertension [name] will continue all her medications, and will continue working on healthy weight loss and exercise to improve blood pressure control. We will watch for signs of hypotension as she continues her lifestyle modifications.  2. Class 3 severe obesity with serious comorbidity and body mass index (BMI) of 45.0 to 49.9 in adult, unspecified obesity type [name]) [name] is currently in the action stage of change. As such, her goal is to continue with  weight loss efforts. She has agreed to the Category 1 Plan.   We planned for 2 large meals and a snack on the weekend since this is her usual eating habit because she sleeps late. Brunch-3 eggs with grated cheddar, 3 slices of [place] bacon, and 1-2 pieces of toast.  Snack-1 cup of Fairlife milk and 1 Protein One bar. Dinner-Per Category 1.  Exercise goals: As is.  Behavioral modification strategies: increasing lean protein intake, meal planning and cooking strategies and better snacking choices.  [name] has agreed to follow-up with our clinic in 3 weeks. She was informed of the importance of frequent follow-up visits to maximize her success with intensive lifestyle modifications for her multiple health conditions.   Objective:   Blood pressure 114/73, pulse 75, temperature 98.1 F (36.7 C), temperature source Oral, height 5\' 8"  (1.727 m), weight 298 lb (135.2 kg), SpO2 98 %. Body mass index is 45.31 kg/m.  General: Cooperative, alert, well developed, in no acute distress. HEENT: Conjunctivae and lids unremarkable. Cardiovascular: Regular rhythm.  Lungs: Normal work of breathing. Neurologic: No focal deficits.   Lab Results  Component Value Date   CREATININE 0.96 07/12/2019   BUN 17 07/12/2019   NA 143 07/12/2019   K 4.1 07/12/2019   CL 106 07/12/2019   CO2 25 07/12/2019   Lab Results  Component Value Date   ALT 13 07/12/2019   AST 8 07/12/2019   ALKPHOS 59 07/12/2019   BILITOT 0.5 07/12/2019   Lab Results  Component Value Date  HGBA1C 4.8 07/12/2019   HGBA1C 5.4 02/21/2019   Lab Results  Component Value Date   INSULIN 8.2 07/12/2019   INSULIN 8.2 02/21/2019   Lab Results  Component Value Date   TSH 3.070 07/12/2019   Lab Results  Component Value Date   CHOL 161 02/21/2019   HDL 67 02/21/2019   LDLCALC 81 02/21/2019   LDLDIRECT 128.5 04/30/2008   TRIG 66 02/21/2019   CHOLHDL 3 10/19/2018   Lab Results  Component Value Date   WBC 4.5 02/21/2019   HGB  11.8 02/21/2019   HCT 35.4 02/21/2019   MCV 86 02/21/2019   PLT 183 02/21/2019   Lab Results  Component Value Date   IRON 63 02/21/2019   TIBC 282 02/21/2019   FERRITIN 45 02/21/2019   Attestation Statements:   Reviewed by clinician on day of visit: allergies, medications, problem list, medical history, surgical history, family history, social history, and previous encounter notes.   Wilhemena Durie, am acting as Location manager for [employer], FNP-C.  I have reviewed the above documentation for accuracy and completeness, and I agree with the above. -  Georgianne Fick, FNP

## 2019-11-02 ENCOUNTER — Encounter: Payer: Self-pay | Admitting: Family Medicine

## 2019-11-02 ENCOUNTER — Ambulatory Visit (INDEPENDENT_AMBULATORY_CARE_PROVIDER_SITE_OTHER): Payer: Managed Care, Other (non HMO) | Admitting: Family Medicine

## 2019-11-02 ENCOUNTER — Other Ambulatory Visit: Payer: Self-pay

## 2019-11-02 VITALS — BP 140/60 | HR 66 | Temp 98.3°F | Resp 18 | Ht 68.0 in | Wt 306.2 lb

## 2019-11-02 DIAGNOSIS — E039 Hypothyroidism, unspecified: Secondary | ICD-10-CM

## 2019-11-02 DIAGNOSIS — I10 Essential (primary) hypertension: Secondary | ICD-10-CM | POA: Diagnosis not present

## 2019-11-02 DIAGNOSIS — Z0000 Encounter for general adult medical examination without abnormal findings: Secondary | ICD-10-CM

## 2019-11-02 LAB — CBC WITH DIFFERENTIAL/PLATELET
Basophils Absolute: 0.1 10*3/uL (ref 0.0–0.1)
Basophils Relative: 1.3 % (ref 0.0–3.0)
Eosinophils Absolute: 0.2 10*3/uL (ref 0.0–0.7)
Eosinophils Relative: 3.7 % (ref 0.0–5.0)
HCT: 33.9 % — ABNORMAL LOW (ref 36.0–46.0)
Hemoglobin: 11.4 g/dL — ABNORMAL LOW (ref 12.0–15.0)
Lymphocytes Relative: 29.4 % (ref 12.0–46.0)
Lymphs Abs: 1.3 10*3/uL (ref 0.7–4.0)
MCHC: 33.6 g/dL (ref 30.0–36.0)
MCV: 86.5 fl (ref 78.0–100.0)
Monocytes Absolute: 0.4 10*3/uL (ref 0.1–1.0)
Monocytes Relative: 9.1 % (ref 3.0–12.0)
Neutro Abs: 2.5 10*3/uL (ref 1.4–7.7)
Neutrophils Relative %: 56.5 % (ref 43.0–77.0)
Platelets: 239 10*3/uL (ref 150.0–400.0)
RBC: 3.92 Mil/uL (ref 3.87–5.11)
RDW: 13.6 % (ref 11.5–15.5)
WBC: 4.5 10*3/uL (ref 4.0–10.5)

## 2019-11-02 LAB — COMPREHENSIVE METABOLIC PANEL
ALT: 13 U/L (ref 0–35)
AST: 11 U/L (ref 0–37)
Albumin: 3.5 g/dL (ref 3.5–5.2)
Alkaline Phosphatase: 47 U/L (ref 39–117)
BUN: 23 mg/dL (ref 6–23)
CO2: 30 mEq/L (ref 19–32)
Calcium: 8.5 mg/dL (ref 8.4–10.5)
Chloride: 104 mEq/L (ref 96–112)
Creatinine, Ser: 1.05 mg/dL (ref 0.40–1.20)
GFR: 64.95 mL/min (ref >60.00)
Glucose, Bld: 79 mg/dL (ref 70–99)
Potassium: 3.7 mEq/L (ref 3.5–5.1)
Sodium: 140 mEq/L (ref 135–145)
Total Bilirubin: 0.6 mg/dL (ref 0.2–1.2)
Total Protein: 6.4 g/dL (ref 6.0–8.3)

## 2019-11-02 LAB — LIPID PANEL
Cholesterol: 148 mg/dL (ref 0–200)
HDL: 56 mg/dL (ref >39.00)
LDL Cholesterol: 75 mg/dL (ref 0–99)
NonHDL: 92.47
Total CHOL/HDL Ratio: 3
Triglycerides: 86 mg/dL (ref 0.0–149.0)
VLDL: 17.2 mg/dL (ref 0.0–40.0)

## 2019-11-02 LAB — TSH: TSH: 1.91 u[IU]/mL (ref 0.35–4.50)

## 2019-11-02 NOTE — Patient Instructions (Signed)

## 2019-11-02 NOTE — Progress Notes (Signed)
Subjective:     [name] is a 59 y.o. female and is here for a comprehensive physical exam. The patient reports problems - R knee pain--- sees ortho and has had pt.  Social History   Socioeconomic History   Marital status: Married    Spouse name: Not on file   Number of children: 0   Years of education: Not on file   Highest education level: Not on file  Occupational History   Occupation: Health visitor CLERK    Employer: [employer]  Tobacco Use   Smoking status: Never Smoker   Smokeless tobacco: Never Used  Substance and Sexual Activity   Alcohol use: No   Drug use: No   Sexual activity: Yes    Partners: Male  Other Topics Concern   Not on file  Social History Narrative   Regular exercise- joined [employer]--- plans to go 5 days a week   Social Determinants of Radio broadcast assistant Strain:    Difficulty of Paying Living Expenses:   Food Insecurity:    Worried About Charity fundraiser in the Last Year:    Arboriculturist in the Last Year:   Transportation Needs:    Film/video editor (Medical):    Lack of Transportation (Non-Medical):   Physical Activity:    Days of Exercise per Week:    Minutes of Exercise per Session:   Stress:    Feeling of Stress :   Social Connections:    Frequency of Communication with Friends and Family:    Frequency of Social Gatherings with Friends and Family:    Attends Religious Services:    Active Member of Clubs or Organizations:    Attends Archivist Meetings:    Marital Status:   Intimate Partner Violence:    Fear of Current or Ex-Partner:    Emotionally Abused:    Physically Abused:    Sexually Abused:    Health Maintenance  Topic Date Due   PAP SMEAR-Modifier  02/09/2019   INFLUENZA VACCINE  11/18/2019   MAMMOGRAM  08/19/2020   COLONOSCOPY  08/02/2024   TETANUS/TDAP  01/13/2028   COVID-19 Vaccine  Completed   Hepatitis C Screening  Completed   HIV Screening   Completed    The following portions of the patient's history were reviewed and updated as appropriate:  She  has a past medical history of Abdominal pain, other specified site, Acute upper respiratory infections of unspecified site, Allergy, Anemia, Benign neoplasm of skin, site unspecified, Disturbance of skin sensation, Diverticulosis of colon (without mention of hemorrhage), Family history of diabetes mellitus, Family history of ischemic heart disease, Family history of malignant neoplasm of genital organ, other, GERD (gastroesophageal reflux disease), Hypertension, Hypokalemia, Left shoulder pain, Low blood potassium, Post-menopausal, Rheumatoid arthritis ([place]), Sleep apnea, Sprain and strain of unspecified site of knee and leg, Sprain of neck, Thyroid disease, and Unspecified essential hypertension. She does not have any pertinent problems on file. She  has a past surgical history that includes Total abdominal hysterectomy w/ bilateral salpingoophorectomy (2002); Bunionectomy (2005); Shoulder surgery; Abdominal hysterectomy; and Colonoscopy (6-12/2008). Her family history includes Breast cancer in her cousin and maternal aunt; Cancer in her father; Dementia in her mother; Diabetes in her brother and mother; Hypertension in her father, mother, and sister; Prostate cancer in her father; Stroke in her maternal aunt and mother. She  reports that she has never smoked. She has never used smokeless tobacco. She reports that she  does not drink alcohol and does not use drugs. She has a current medication list which includes the following prescription(s): amlodipine, vitamin d, clotrimazole-betamethasone, cyanocobalamin, estradiol, fluticasone, furosemide, hydrochlorothiazide, hydroxychloroquine, levothyroxine, losartan, naftifine, naproxen sodium, omeprazole, potassium chloride, and restasis. Current Outpatient Medications on File Prior to Visit  Medication Sig Dispense Refill   amLODipine (NORVASC) 10 MG  tablet TAKE ONE TABLET BY MOUTH DAILY 90 tablet 0   Cholecalciferol (VITAMIN D) 50 MCG (2000 UT) CAPS Take 1 capsule (2,000 Units total) by mouth daily. 30 capsule 0   clotrimazole-betamethasone (LOTRISONE) cream Apply 1 application topically 2 (two) times daily. 30 g 0   Cyanocobalamin (VITAMIN B-12 PO) Take 1 tablet by mouth daily.     estradiol (ESTRACE) 2 MG tablet Take 1 mg by mouth as needed.      fluticasone (FLONASE) 50 MCG/ACT nasal spray INSTILL TWO SPRAYS INTO BOTH NOSTRIL DAILY. 16 g 2   furosemide (LASIX) 40 MG tablet 1 every 3 days if needed. 30 tablet 0   hydrochlorothiazide (HYDRODIURIL) 25 MG tablet Take 1 tablet (25 mg total) by mouth daily. 30 tablet 0   hydroxychloroquine (PLAQUENIL) 200 MG tablet Take 200 mg by mouth 2 (two) times daily.       levothyroxine (SYNTHROID) 50 MCG tablet TAKE ONE TABLET BY MOUTH EVERY MORNING BEFORE BREAKFAST 90 tablet 1   losartan (COZAAR) 100 MG tablet Take 1 tablet (100 mg total) by mouth daily. 30 tablet 0   naftifine (NAFTIN) 1 % cream Apply topically daily. 60 g 2   naproxen sodium (ALEVE) 220 MG tablet Take 220 mg by mouth.     omeprazole (PRILOSEC) 20 MG capsule TAKE ONE CAPSULE BY MOUTH DAILY 90 capsule 1   potassium chloride (KLOR-CON) 10 MEQ tablet TAKE TWO TABLETS BY MOUTH TWICE A DAY 120 tablet 5   RESTASIS 0.05 % ophthalmic emulsion Place 1 drop into both eyes daily.      No current facility-administered medications on file prior to visit.   She has No Known Allergies..  Review of Systems Review of Systems  Constitutional: Negative for activity change, appetite change and fatigue.  HENT: Negative for hearing loss, congestion, tinnitus and ear discharge.  dentist q56m Eyes: Negative for visual disturbance (see optho q1y -- vision corrected to 20/20 with glasses).  Respiratory: Negative for cough, chest tightness and shortness of breath.   Cardiovascular: Negative for chest pain, palpitations and leg swelling.   Gastrointestinal: Negative for abdominal pain, diarrhea, constipation and abdominal distention.  Genitourinary: Negative for urgency, frequency, decreased urine volume and difficulty urinating.  Musculoskeletal: Negative for back pain, + r knee pain Skin: Negative for color change, pallor and rash.  Neurological: Negative for dizziness, light-headedness, numbness and headaches.  Hematological: Negative for adenopathy. Does not bruise/bleed easily.  Psychiatric/Behavioral: Negative for suicidal ideas, confusion, sleep disturbance, self-injury, dysphoric mood, decreased concentration and agitation.       Objective:    BP 140/60 (BP Location: Right Arm, Patient Position: Sitting, Cuff Size: Large)    Pulse 66    Temp 98.3 F (36.8 C) (Temporal)    Resp 18    Ht 5\' 8"  (1.727 m)    Wt (!) 306 lb 3.2 oz (138.9 kg)    SpO2 97%    BMI 46.56 kg/m  General appearance: alert, cooperative, appears stated age and no distress Head: Normocephalic, without obvious abnormality, atraumatic Eyes: conjunctivae/corneas clear. PERRL, EOM's intact. Fundi benign. Ears: normal TM's and external ear canals both ears Neck: no adenopathy,  no carotid bruit, no JVD, supple, symmetrical, trachea midline and thyroid not enlarged, symmetric, no tenderness/mass/nodules Back: symmetric, no curvature. ROM normal. No CVA tenderness. Lungs: clear to auscultation bilaterally Breasts: gyn Heart: regular rate and rhythm, S1, S2 normal, no murmur, click, rub or gallop Abdomen: soft, non-tender; bowel sounds normal; no masses,  no organomegaly Pelvic: deferred Extremities: pain R knee -- anterior, +swelling  Pulses: 2+ and symmetric Skin: Skin color, texture, turgor normal. No rashes or lesions Lymph nodes: Cervical, supraclavicular, and axillary nodes normal. Neurologic: Alert and oriented X 3, normal strength and tone. Normal symmetric reflexes. Normal coordination and gait    Assessment:    Healthy female exam.       Plan:     ghm utd Check labs  See After Visit Summary for Counseling Recommendations    1. Hypothyroidism, unspecified type Check labs  - TSH  2. Essential hypertension Well controlled, no changes to meds. Encouraged heart healthy diet such as the DASH diet and exercise as tolerated.   - Lipid panel - TSH - CBC with Differential/Platelet - Comprehensive metabolic panel  3. Preventative health care See above - Lipid panel - TSH - CBC with Differential/Platelet - Comprehensive metabolic panel

## 2019-11-11 ENCOUNTER — Other Ambulatory Visit (INDEPENDENT_AMBULATORY_CARE_PROVIDER_SITE_OTHER): Payer: Self-pay | Admitting: Family Medicine

## 2019-11-11 DIAGNOSIS — I10 Essential (primary) hypertension: Secondary | ICD-10-CM

## 2019-11-20 ENCOUNTER — Encounter (INDEPENDENT_AMBULATORY_CARE_PROVIDER_SITE_OTHER): Payer: Self-pay | Admitting: Family Medicine

## 2019-11-20 ENCOUNTER — Ambulatory Visit (INDEPENDENT_AMBULATORY_CARE_PROVIDER_SITE_OTHER): Payer: Managed Care, Other (non HMO) | Admitting: Family Medicine

## 2019-11-20 ENCOUNTER — Other Ambulatory Visit: Payer: Self-pay

## 2019-11-20 VITALS — BP 120/71 | HR 89 | Temp 98.1°F | Ht 68.0 in | Wt 299.0 lb

## 2019-11-20 DIAGNOSIS — Z6841 Body Mass Index (BMI) 40.0 and over, adult: Secondary | ICD-10-CM | POA: Diagnosis not present

## 2019-11-20 DIAGNOSIS — E8881 Metabolic syndrome: Secondary | ICD-10-CM

## 2019-11-21 ENCOUNTER — Encounter (INDEPENDENT_AMBULATORY_CARE_PROVIDER_SITE_OTHER): Payer: Self-pay | Admitting: Family Medicine

## 2019-11-21 NOTE — Progress Notes (Signed)
Chief Complaint:   OBESITY Essence is here to discuss her progress with her obesity treatment plan along with follow-up of her obesity related diagnoses. [name] is on the Category 1 Plan and states she is following her eating plan approximately 100% of the time. [name] states she is walking for 30 minutes 5 times per week.  Today's visit was #: 14 Starting weight: 297 lbs Starting date: 02/21/2019 Today's weight: 299 lbs Today's date: 11/20/2019 Total lbs lost to date: 0 Total lbs lost since last in-office visit: 0  Interim History: [name] is frustrated with lack of weight loss despite adhering to the plan well. She notes hunger between lunch and dinner.  Subjective:   1. Insulin resistance [name] has a diagnosis of insulin resistance based on her elevated fasting insulin level >5. She notes polyphagia. Last A1c was 4.8, and she is on metformin. She continues to work on diet and exercise to decrease her risk of diabetes.  Lab Results  Component Value Date   INSULIN 8.2 07/12/2019   INSULIN 8.2 02/21/2019   Lab Results  Component Value Date   HGBA1C 4.8 07/12/2019   Assessment/Plan:   1. Insulin resistance [name] will continue to work on weight loss, exercise, and decreasing simple carbohydrates to help decrease the risk of diabetes. [name] declines medications for hunger today, and she agreed to follow-up with [place] as directed to closely monitor her progress.  2. Class 3 severe obesity with serious comorbidity and body mass index (BMI) of 45.0 to 49.9 in adult, unspecified obesity type [facility]) [name] is currently in the action stage of change. As such, her goal is to continue with weight loss efforts. She has agreed to the Category 1 Plan.   We discussed how to move food around to assuage hunger. Encouragement was provided.  Exercise goals: As is.  Behavioral modification strategies: increasing lean protein intake, decreasing simple carbohydrates, no skipping meals and  planning for success.  [name] has agreed to follow-up with our clinic in 3 weeks. She was informed of the importance of frequent follow-up visits to maximize her success with intensive lifestyle modifications for her multiple health conditions.   Objective:   Blood pressure 120/71, pulse 89, temperature 98.1 F (36.7 C), temperature source Oral, height 5\' 8"  (1.727 m), weight 299 lb (135.6 kg), SpO2 98 %. Body mass index is 45.46 kg/m.  General: Cooperative, alert, well developed, in no acute distress. HEENT: Conjunctivae and lids unremarkable. Cardiovascular: Regular rhythm.  Lungs: Normal work of breathing. Neurologic: No focal deficits.   Lab Results  Component Value Date   CREATININE 1.05 11/02/2019   BUN 23 11/02/2019   NA 140 11/02/2019   K 3.7 11/02/2019   CL 104 11/02/2019   CO2 30 11/02/2019   Lab Results  Component Value Date   ALT 13 11/02/2019   AST 11 11/02/2019   ALKPHOS 47 11/02/2019   BILITOT 0.6 11/02/2019   Lab Results  Component Value Date   HGBA1C 4.8 07/12/2019   HGBA1C 5.4 02/21/2019   Lab Results  Component Value Date   INSULIN 8.2 07/12/2019   INSULIN 8.2 02/21/2019   Lab Results  Component Value Date   TSH 1.91 11/02/2019   Lab Results  Component Value Date   CHOL 148 11/02/2019   HDL 56.00 11/02/2019   LDLCALC 75 11/02/2019   LDLDIRECT 128.5 04/30/2008   TRIG 86.0 11/02/2019   CHOLHDL 3 11/02/2019   Lab Results  Component Value Date   WBC 4.5  11/02/2019   HGB 11.4 (L) 11/02/2019   HCT 33.9 (L) 11/02/2019   MCV 86.5 11/02/2019   PLT 239.0 11/02/2019   Lab Results  Component Value Date   IRON 63 02/21/2019   TIBC 282 02/21/2019   FERRITIN 45 02/21/2019   Attestation Statements:   Reviewed by clinician on day of visit: allergies, medications, problem list, medical history, surgical history, family history, social history, and previous encounter notes.   Wilhemena Durie, am acting as Location manager for [employer],  FNP-C.  I have reviewed the above documentation for accuracy and completeness, and I agree with the above. -  Georgianne Fick, FNP

## 2019-12-10 ENCOUNTER — Other Ambulatory Visit (INDEPENDENT_AMBULATORY_CARE_PROVIDER_SITE_OTHER): Payer: Self-pay | Admitting: Family Medicine

## 2019-12-10 DIAGNOSIS — I10 Essential (primary) hypertension: Secondary | ICD-10-CM

## 2019-12-11 ENCOUNTER — Other Ambulatory Visit: Payer: Self-pay

## 2019-12-11 ENCOUNTER — Ambulatory Visit (INDEPENDENT_AMBULATORY_CARE_PROVIDER_SITE_OTHER): Payer: Managed Care, Other (non HMO) | Admitting: Family Medicine

## 2019-12-11 ENCOUNTER — Encounter (INDEPENDENT_AMBULATORY_CARE_PROVIDER_SITE_OTHER): Payer: Self-pay | Admitting: Family Medicine

## 2019-12-11 VITALS — BP 104/69 | HR 75 | Temp 98.0°F | Ht 68.0 in | Wt 303.0 lb

## 2019-12-11 DIAGNOSIS — Z6841 Body Mass Index (BMI) 40.0 and over, adult: Secondary | ICD-10-CM | POA: Diagnosis not present

## 2019-12-11 DIAGNOSIS — I10 Essential (primary) hypertension: Secondary | ICD-10-CM

## 2019-12-12 ENCOUNTER — Encounter (INDEPENDENT_AMBULATORY_CARE_PROVIDER_SITE_OTHER): Payer: Self-pay | Admitting: Family Medicine

## 2019-12-12 NOTE — Progress Notes (Addendum)
Chief Complaint:   OBESITY [name] is here to discuss her progress with her obesity treatment plan along with follow-up of her obesity related diagnoses. [name] is on the Category 1 Plan and states she is following her eating plan approximately 100% of the time. [name] states she is walking for 15-20 minutes 5 times per week.  Today's visit was #: 15 Starting weight: 297 lbs Starting date: 02/21/2019 Today's weight: 303 lbs Today's date: 12/11/2019 Total lbs lost to date: 0 Total lbs lost since last in-office visit: 0  Interim History: [name] did well the first 2 weeks but then totally went off the plan the last week. She is now back on the plan. She notes snacking twice daily between meals and not keeping track of calories. She generally will have a Balanced Break (160-180 calories) and then another snack of > 100 calories. She eats lunch in the cafeteria at work and the vegetables tend to be cooked with oil, cheese, or butter. She switches dinner and lunch and has her largest meal at work in [employer]. She denies excessive hunger however she notes she is hungrier between lunch and dinner.  Subjective:   1. Essential hypertension [name]'s blood pressure is well controlled on Norvasc and hydrochlorothiazide. Cardiovascular ROS: no chest pain or dyspnea on exertion.  BP Readings from Last 3 Encounters:  12/11/19 104/69  11/20/19 120/71  11/02/19 140/60   Lab Results  Component Value Date   CREATININE 1.05 11/02/2019   CREATININE 0.96 07/12/2019   CREATININE 0.96 02/21/2019   Assessment/Plan:   1. Essential hypertension [name] will continue all her medications, and will continue working on healthy weight loss and exercise to improve blood pressure control.   2. Class 3 severe obesity with serious comorbidity and body mass index (BMI) of 45.0 to 49.9 in adult, unspecified obesity type [name]) [name] is currently in the action stage of change. As such, her goal is to continue  with weight loss efforts. She has agreed to the Category 1 Plan.   Handouts given today: Smart Fruit, and 100 Calorie Snacks. We discussed the importance of being aware of extra calories added to vegetables. I also advised her to keep snack calories at 100 calories or less.   Exercise goals: As is.  Behavioral modification strategies: meal planning and cooking strategies and better snacking choices.  [name] has agreed to follow-up with our clinic in 3 weeks.  Objective:   Blood pressure 104/69, pulse 75, temperature 98 F (36.7 C), temperature source Oral, height 5\' 8"  (1.727 m), weight (!) 303 lb (137.4 kg), SpO2 99 %. Body mass index is 46.07 kg/m.  General: Cooperative, alert, well developed, in no acute distress. HEENT: Conjunctivae and lids unremarkable. Cardiovascular: Regular rhythm.  Lungs: Normal work of breathing. Neurologic: No focal deficits.   Lab Results  Component Value Date   CREATININE 1.05 11/02/2019   BUN 23 11/02/2019   NA 140 11/02/2019   K 3.7 11/02/2019   CL 104 11/02/2019   CO2 30 11/02/2019   Lab Results  Component Value Date   ALT 13 11/02/2019   AST 11 11/02/2019   ALKPHOS 47 11/02/2019   BILITOT 0.6 11/02/2019   Lab Results  Component Value Date   HGBA1C 4.8 07/12/2019   HGBA1C 5.4 02/21/2019   Lab Results  Component Value Date   INSULIN 8.2 07/12/2019   INSULIN 8.2 02/21/2019   Lab Results  Component Value Date   TSH 1.91 11/02/2019   Lab Results  Component Value Date   CHOL 148 11/02/2019   HDL 56.00 11/02/2019   LDLCALC 75 11/02/2019   LDLDIRECT 128.5 04/30/2008   TRIG 86.0 11/02/2019   CHOLHDL 3 11/02/2019   Lab Results  Component Value Date   WBC 4.5 11/02/2019   HGB 11.4 (L) 11/02/2019   HCT 33.9 (L) 11/02/2019   MCV 86.5 11/02/2019   PLT 239.0 11/02/2019   Lab Results  Component Value Date   IRON 63 02/21/2019   TIBC 282 02/21/2019   FERRITIN 45 02/21/2019   Attestation Statements:   Reviewed by  clinician on day of visit: allergies, medications, problem list, medical history, surgical history, family history, social history, and previous encounter notes.   [name], am acting as Location manager for [employer], FNP-C.  I have reviewed the above documentation for accuracy and completeness, and I agree with the above. -  Andrea Fick, FNP

## 2019-12-26 ENCOUNTER — Ambulatory Visit (INDEPENDENT_AMBULATORY_CARE_PROVIDER_SITE_OTHER): Payer: Managed Care, Other (non HMO) | Admitting: Podiatry

## 2019-12-26 ENCOUNTER — Ambulatory Visit (INDEPENDENT_AMBULATORY_CARE_PROVIDER_SITE_OTHER): Payer: Managed Care, Other (non HMO)

## 2019-12-26 ENCOUNTER — Other Ambulatory Visit: Payer: Self-pay

## 2019-12-26 ENCOUNTER — Encounter: Payer: Self-pay | Admitting: Podiatry

## 2019-12-26 DIAGNOSIS — M722 Plantar fascial fibromatosis: Secondary | ICD-10-CM

## 2019-12-26 MED ORDER — DICLOFENAC SODIUM 75 MG PO TBEC
75.0000 mg | DELAYED_RELEASE_TABLET | Freq: Two times a day (BID) | ORAL | 2 refills | Status: DC
Start: 2019-12-26 — End: 2022-02-25

## 2019-12-26 NOTE — Patient Instructions (Signed)

## 2019-12-26 NOTE — Progress Notes (Signed)
Subjective:   Patient ID: [name], female   DOB: 59 y.o.   MRN: [id number]   HPI Patient states she is developed a lot of pain in the plantar of her right heel over the last 4 months.  Does not remember specific injury does have moderate obesity and works on cement floors with flatfoot deformity.  Patient does not smoke likes to be active   Review of Systems  All other systems reviewed and are negative.       Objective:  Physical Exam Vitals and nursing note reviewed.  Constitutional:      Appearance: She is well-developed.  Pulmonary:     Effort: Pulmonary effort is normal.  Musculoskeletal:        General: Normal range of motion.  Skin:    General: Skin is warm.  Neurological:     Mental Status: She is alert.     Neurovascular status intact muscle strength adequate range of motion within normal limits.  Patient is noted to have exquisite discomfort plantar aspect right heel at the insertional point tendon calcaneus with inflammation fluid around the medial band.  Patient does have moderate obesity which is complicating factor does have flatfoot deformity and good digital perfusion well oriented x3     Assessment:  Acute plantar fasciitis right with inflammation fluid buildup     Plan:  H&P reviewed condition.  Today I did sterile prep injected the fascia 3 mg Kenalog 5 mg Xylocaine applied fascial brace to lift up the arch gave instructions for physical therapy stretching exercises shoe gear modification and reappoint to recheck again  X-ray indicates spur does not indicate indications of stress fracture arthritis

## 2019-12-29 ENCOUNTER — Other Ambulatory Visit: Payer: Self-pay | Admitting: Family Medicine

## 2019-12-29 DIAGNOSIS — E876 Hypokalemia: Secondary | ICD-10-CM

## 2020-01-02 ENCOUNTER — Ambulatory Visit (INDEPENDENT_AMBULATORY_CARE_PROVIDER_SITE_OTHER): Payer: Managed Care, Other (non HMO) | Admitting: Family Medicine

## 2020-01-07 ENCOUNTER — Encounter: Payer: Self-pay | Admitting: Adult Health

## 2020-01-07 ENCOUNTER — Ambulatory Visit (INDEPENDENT_AMBULATORY_CARE_PROVIDER_SITE_OTHER): Payer: Managed Care, Other (non HMO) | Admitting: Adult Health

## 2020-01-07 VITALS — BP 143/71 | HR 63 | Ht 68.0 in | Wt 304.8 lb

## 2020-01-07 DIAGNOSIS — Z9989 Dependence on other enabling machines and devices: Secondary | ICD-10-CM | POA: Diagnosis not present

## 2020-01-07 DIAGNOSIS — G4733 Obstructive sleep apnea (adult) (pediatric): Secondary | ICD-10-CM

## 2020-01-07 NOTE — Progress Notes (Signed)
PATIENT: [name] DOB: 02/14/1961  REASON FOR VISIT: follow up HISTORY FROM: patient  HISTORY OF PRESENT ILLNESS: Today 01/07/20:  [name] is a 59 year old female with a history of obstructive sleep apnea on CPAP.  Her download indicates that she used her machine 20 out of 30 days for compliance of 67%.  She used her machine greater than 4 hours 18 days for compliance of 60%.  On average she uses her machine 5 hours and 20 minutes.  Her residual AHI is 0.1 on 5 to 15 cm of water with EPR 2.  Leak in the 95th percentile is 9.5.  She does report that she takes an unexpected trip to [facility] as her mother was in the hospital and was there for a week without her CPAP.  HISTORY [name] is a 59 year old female with a history of obstructive sleep apnea on CPAP.  Her download indicates that she use her machine 28 out of 30 days for compliance of 93%.  She use her machine greater than 4 hours 20 days for compliance of 67%.  On average she uses her machine 4 hours and 27 minutes.  Her residual AHI is 2.9 on 5 to 15 cm of water with EPR 2.  Leak in the 95th percentile is 63.7 L/min.  She does feel the mask leaking.  She states that she also has allergies and sometimes finds the mask hard to use.   REVIEW OF SYSTEMS: Out of a complete 14 system review of symptoms, the patient complains only of the following symptoms, and all other reviewed systems are negative.  FSS 21 ESS 9  ALLERGIES: No Known Allergies  HOME MEDICATIONS: Outpatient Medications Prior to Visit  Medication Sig Dispense Refill  . amLODipine (NORVASC) 10 MG tablet TAKE ONE TABLET BY MOUTH DAILY 90 tablet 0  . Cholecalciferol (VITAMIN D) 50 MCG (2000 UT) CAPS Take 1 capsule (2,000 Units total) by mouth daily. 30 capsule 0  . clotrimazole-betamethasone (LOTRISONE) cream Apply 1 application topically 2 (two) times daily. 30 g 0  . Cyanocobalamin (VITAMIN B-12 PO) Take 1 tablet by mouth daily.    . diclofenac (VOLTAREN) 75 MG EC  tablet Take 1 tablet (75 mg total) by mouth 2 (two) times daily. 50 tablet 2  . estradiol (ESTRACE) 2 MG tablet Take 1 mg by mouth as needed.     . fluticasone (FLONASE) 50 MCG/ACT nasal spray INSTILL TWO SPRAYS INTO BOTH NOSTRIL DAILY. 16 g 2  . furosemide (LASIX) 40 MG tablet 1 every 3 days if needed. 30 tablet 0  . hydrochlorothiazide (HYDRODIURIL) 25 MG tablet TAKE ONE TABLET BY MOUTH DAILY 90 tablet 0  . hydroxychloroquine (PLAQUENIL) 200 MG tablet Take 200 mg by mouth 2 (two) times daily.      Marland Kitchen levothyroxine (SYNTHROID) 50 MCG tablet TAKE ONE TABLET BY MOUTH EVERY MORNING BEFORE BREAKFAST 90 tablet 1  . losartan (COZAAR) 100 MG tablet Take 1 tablet (100 mg total) by mouth daily. 90 tablet 1  . naftifine (NAFTIN) 1 % cream Apply topically daily. 60 g 2  . naproxen sodium (ALEVE) 220 MG tablet Take 220 mg by mouth.    Marland Kitchen omeprazole (PRILOSEC) 20 MG capsule TAKE ONE CAPSULE BY MOUTH DAILY 90 capsule 1  . potassium chloride (KLOR-CON) 10 MEQ tablet TAKE TWO TABLETS BY MOUTH TWICE A DAY 120 tablet 5  . RESTASIS 0.05 % ophthalmic emulsion Place 1 drop into both eyes daily.      No facility-administered  medications prior to visit.    PAST MEDICAL HISTORY: Past Medical History:  Diagnosis Date  . Abdominal pain, other specified site   . Acute upper respiratory infections of unspecified site   . Allergy    seasonal  . Anemia   . Benign neoplasm of skin, site unspecified   . Disturbance of skin sensation   . Diverticulosis of colon (without mention of hemorrhage)   . Family history of diabetes mellitus   . Family history of ischemic heart disease   . Family history of malignant neoplasm of genital organ, other   . GERD (gastroesophageal reflux disease)   . Hypertension   . Hypokalemia   . Left shoulder pain   . Low blood potassium   . Post-menopausal   . Rheumatoid arthritis ([place])   . Sleep apnea    wears cpap   . Sprain and strain of unspecified site of knee and leg   . Sprain  of neck   . Thyroid disease   . Unspecified essential hypertension     PAST SURGICAL HISTORY: Past Surgical History:  Procedure Laterality Date  . ABDOMINAL HYSTERECTOMY    . BUNIONECTOMY  2005   right foot  . COLONOSCOPY  6-12/2008  . SHOULDER SURGERY     left;spurs 2-09- dr Rhoderick Moody  . TOTAL ABDOMINAL HYSTERECTOMY W/ BILATERAL SALPINGOOPHORECTOMY  2002    FAMILY HISTORY: Family History  Problem Relation Age of Onset  . Diabetes Mother   . Hypertension Mother   . Dementia Mother   . Stroke Mother   . Stroke Maternal Aunt   . Breast cancer Maternal Aunt   . Prostate cancer Father   . Hypertension Father   . Cancer Father   . Diabetes Brother   . Hypertension Sister   . Breast cancer Cousin   . Colon cancer Neg Hx   . Colon polyps Neg Hx   . Esophageal cancer Neg Hx   . Rectal cancer Neg Hx   . Stomach cancer Neg Hx     SOCIAL HISTORY: Social History   Socioeconomic History  . Marital status: Married    Spouse name: Not on file  . Number of children: 0  . Years of education: Not on file  . Highest education level: Not on file  Occupational History  . Occupation: Retail buyer: [employer]  Tobacco Use  . Smoking status: Never Smoker  . Smokeless tobacco: Never Used  Substance and Sexual Activity  . Alcohol use: No  . Drug use: No  . Sexual activity: Yes    Partners: Male  Other Topics Concern  . Not on file  Social History Narrative   Regular exercise- joined [employer]--- plans to go 5 days a week   Social Determinants of Health   Financial Resource Strain:   . Difficulty of Paying Living Expenses: Not on file  Food Insecurity:   . Worried About Charity fundraiser in the Last Year: Not on file  . Ran Out of Food in the Last Year: Not on file  Transportation Needs:   . Lack of Transportation (Medical): Not on file  . Lack of Transportation (Non-Medical): Not on file  Physical Activity:   . Days of Exercise per Week: Not on file  .  Minutes of Exercise per Session: Not on file  Stress:   . Feeling of Stress : Not on file  Social Connections:   . Frequency of Communication with Friends and Family: Not  on file  . Frequency of Social Gatherings with Friends and Family: Not on file  . Attends Religious Services: Not on file  . Active Member of Clubs or Organizations: Not on file  . Attends Archivist Meetings: Not on file  . Marital Status: Not on file  Intimate Partner Violence:   . Fear of Current or Ex-Partner: Not on file  . Emotionally Abused: Not on file  . Physically Abused: Not on file  . Sexually Abused: Not on file      PHYSICAL EXAM  Vitals:   01/07/20 1507  BP: (!) 143/71  Pulse: 63  Weight: (!) 304 lb 12.8 oz (138.3 kg)  Height: 5\' 8"  (1.727 m)   Body mass index is 46.34 kg/m.  Generalized: Well developed, in no acute distress  Chest: Lungs clear to auscultation bilaterally  Neurological examination  Mentation: Alert oriented to time, place, history taking. Follows all commands speech and language fluent Cranial nerve II-XII: Extraocular movements were full, visual field were full on confrontational test Head turning and shoulder shrug  were normal and symmetric. Motor: The motor testing reveals 5 over 5 strength of all 4 extremities. Good symmetric motor tone is noted throughout.  Sensory: Sensory testing is intact to soft touch on all 4 extremities. No evidence of extinction is noted.  Gait and station: Gait is normal.    DIAGNOSTIC DATA (LABS, IMAGING, TESTING) - I reviewed patient records, labs, notes, testing and imaging myself where available.  Lab Results  Component Value Date   WBC 4.5 11/02/2019   HGB 11.4 (L) 11/02/2019   HCT 33.9 (L) 11/02/2019   MCV 86.5 11/02/2019   PLT 239.0 11/02/2019      Component Value Date/Time   NA 140 11/02/2019 1338   NA 143 07/12/2019 1656   K 3.7 11/02/2019 1338   CL 104 11/02/2019 1338   CO2 30 11/02/2019 1338   GLUCOSE 79  11/02/2019 1338   GLUCOSE 74 03/03/2006 1039   BUN 23 11/02/2019 1338   BUN 17 07/12/2019 1656   CREATININE 1.05 11/02/2019 1338   CALCIUM 8.5 11/02/2019 1338   PROT 6.4 11/02/2019 1338   PROT 6.2 07/12/2019 1656   ALBUMIN 3.5 11/02/2019 1338   ALBUMIN 3.8 07/12/2019 1656   AST 11 11/02/2019 1338   ALT 13 11/02/2019 1338   ALKPHOS 47 11/02/2019 1338   BILITOT 0.6 11/02/2019 1338   BILITOT 0.5 07/12/2019 1656   GFRNONAA 65 07/12/2019 1656   GFRAA 75 07/12/2019 1656   Lab Results  Component Value Date   CHOL 148 11/02/2019   HDL 56.00 11/02/2019   LDLCALC 75 11/02/2019   LDLDIRECT 128.5 04/30/2008   TRIG 86.0 11/02/2019   CHOLHDL 3 11/02/2019   Lab Results  Component Value Date   HGBA1C 4.8 07/12/2019   Lab Results  Component Value Date   BTDVVOHY07 371 02/21/2019   Lab Results  Component Value Date   TSH 1.91 11/02/2019      ASSESSMENT AND PLAN 59 y.o. year old female  has a past medical history of Abdominal pain, other specified site, Acute upper respiratory infections of unspecified site, Allergy, Anemia, Benign neoplasm of skin, site unspecified, Disturbance of skin sensation, Diverticulosis of colon (without mention of hemorrhage), Family history of diabetes mellitus, Family history of ischemic heart disease, Family history of malignant neoplasm of genital organ, other, GERD (gastroesophageal reflux disease), Hypertension, Hypokalemia, Left shoulder pain, Low blood potassium, Post-menopausal, Rheumatoid arthritis ([place]), Sleep apnea, Sprain and strain of  unspecified site of knee and leg, Sprain of neck, Thyroid disease, and Unspecified essential hypertension. here with:  1. OSA on CPAP  - CPAP compliance suboptimal - Good treatment of AHI  - Encourage patient to use CPAP nightly and > 4 hours each night - F/U in 1 year or sooner if needed   I spent 20 minutes of face-to-face and non-face-to-face time with patient.  This included previsit chart review, lab  review, study review, order entry, electronic health record documentation, patient education.  Ward Givens, MSN, NP-C 01/07/2020, 3:14 PM [facility] [address] [place], [place] [address] [phone]

## 2020-01-09 ENCOUNTER — Ambulatory Visit: Payer: Managed Care, Other (non HMO) | Admitting: Podiatry

## 2020-01-10 ENCOUNTER — Encounter (INDEPENDENT_AMBULATORY_CARE_PROVIDER_SITE_OTHER): Payer: Self-pay

## 2020-01-15 ENCOUNTER — Other Ambulatory Visit: Payer: Self-pay

## 2020-01-15 ENCOUNTER — Encounter (INDEPENDENT_AMBULATORY_CARE_PROVIDER_SITE_OTHER): Payer: Self-pay | Admitting: Family Medicine

## 2020-01-15 ENCOUNTER — Ambulatory Visit (INDEPENDENT_AMBULATORY_CARE_PROVIDER_SITE_OTHER): Payer: Managed Care, Other (non HMO) | Admitting: Family Medicine

## 2020-01-15 VITALS — BP 117/77 | HR 76 | Temp 97.7°F | Ht 68.0 in | Wt 291.0 lb

## 2020-01-15 DIAGNOSIS — I10 Essential (primary) hypertension: Secondary | ICD-10-CM

## 2020-01-15 DIAGNOSIS — E88819 Insulin resistance, unspecified: Secondary | ICD-10-CM

## 2020-01-15 DIAGNOSIS — E8881 Metabolic syndrome: Secondary | ICD-10-CM | POA: Diagnosis not present

## 2020-01-15 DIAGNOSIS — Z6841 Body Mass Index (BMI) 40.0 and over, adult: Secondary | ICD-10-CM

## 2020-01-15 DIAGNOSIS — Z9189 Other specified personal risk factors, not elsewhere classified: Secondary | ICD-10-CM | POA: Diagnosis not present

## 2020-01-15 MED ORDER — HYDROCHLOROTHIAZIDE 25 MG PO TABS: 25.0000 mg | ORAL_TABLET | Freq: Every day | ORAL | 0 refills | Status: DC

## 2020-01-16 ENCOUNTER — Encounter (INDEPENDENT_AMBULATORY_CARE_PROVIDER_SITE_OTHER): Payer: Self-pay | Admitting: Family Medicine

## 2020-01-16 NOTE — Progress Notes (Signed)
Chief Complaint:   OBESITY [name] is here to discuss her progress with her obesity treatment plan along with follow-up of her obesity related diagnoses. [name] is on the Category 1 Plan and states she is following her eating plan approximately 75% of the time. [name] states she is doing 0 minutes 0 times per week.  Today's visit was #: 16 Starting weight: 297 lbs Starting date: 02/21/2019 Today's weight: 291 lbs Today's date: 01/15/2020 Total lbs lost to date: 6 Total lbs lost since last in-office visit: 12  Interim History: [name] lost 12 lbs today and she is surprised.She has recently had family members who were ill which caused her to deviate from plan somewhat. . She has decreased her snack calorie intake to 100 or less. She denies excessive hunger between meals. She is eating all of the prescribed protein.  Subjective:   1. Essential hypertension [name]'s blood pressure is well controlled. Cardiovascular ROS: no chest pain or dyspnea on exertion. She is on losartan 100 mg, hydrochlorothiazide 25 mg, and Norvasc 10 mg daily.  BP Readings from Last 3 Encounters:  01/15/20 117/77  01/07/20 (!) 143/71  12/11/19 104/69   Lab Results  Component Value Date   CREATININE 1.05 11/02/2019   CREATININE 0.96 07/12/2019   CREATININE 0.96 02/21/2019   2. Insulin resistance [name] has a diagnosis of insulin resistance based on her elevated fasting insulin level >5. She denies polyphagia and she is not on metformin.  Lab Results  Component Value Date   INSULIN 8.2 07/12/2019   INSULIN 8.2 02/21/2019   Lab Results  Component Value Date   HGBA1C 4.8 07/12/2019   3. At risk for heart disease [name] is at a higher than average risk for cardiovascular disease due to obesity, family history of CV dx, hypertension, insulin resistance, and OSA.  Assessment/Plan:   1. Essential hypertension [name] is working on healthy weight loss and exercise to improve blood pressure control. We  will watch for signs of hypotension as she continues her lifestyle modifications. [name] will continue her all her medications, and we will refill hydrochlorothiazide 90 days, with no refill.  - hydrochlorothiazide (HYDRODIURIL) 25 MG tablet; Take 1 tablet (25 mg total) by mouth daily.  Dispense: 90 tablet; Refill: 0  2. Insulin resistance [name] will continue her meal plan, and will continue to work on weight loss, exercise, and decreasing simple carbohydrates to help decrease the risk of diabetes.  3. At risk for heart disease [name] was given approximately 15 minutes of coronary artery disease prevention counseling today. She is 59 y.o. female and has risk factors for heart disease including obesity, family history of CV dx, hypertension, insulin resistance, and OSA. . We discussed intensive lifestyle modifications today with an emphasis on specific weight loss instructions and strategies.   Repetitive spaced learning was employed today to elicit superior memory formation and behavioral change.  4. Class 3 severe obesity with serious comorbidity and body mass index (BMI) of 40.0 to 44.9 in adult, unspecified obesity type [name]) [name] is currently in the action stage of change. As such, her goal is to continue with weight loss efforts. She has agreed to the Category 1 Plan.   Exercise goals: All adults should avoid inactivity. Some physical activity is better than none, and adults who participate in any amount of physical activity gain some health benefits.  Behavioral modification strategies: increasing lean protein intake and decreasing sodium intake.  [name] has agreed to follow-up with our clinic in 3  weeks.  Objective:   Blood pressure 117/77, pulse 76, temperature 97.7 F (36.5 C), temperature source Oral, height 5\' 8"  (1.727 m), weight 291 lb (132 kg), SpO2 100 %. Body mass index is 44.25 kg/m.  General: Cooperative, alert, well developed, in no acute distress. HEENT:  Conjunctivae and lids unremarkable. Cardiovascular: Regular rhythm.  Lungs: Normal work of breathing. Neurologic: No focal deficits.   Lab Results  Component Value Date   CREATININE 1.05 11/02/2019   BUN 23 11/02/2019   NA 140 11/02/2019   K 3.7 11/02/2019   CL 104 11/02/2019   CO2 30 11/02/2019   Lab Results  Component Value Date   ALT 13 11/02/2019   AST 11 11/02/2019   ALKPHOS 47 11/02/2019   BILITOT 0.6 11/02/2019   Lab Results  Component Value Date   HGBA1C 4.8 07/12/2019   HGBA1C 5.4 02/21/2019   Lab Results  Component Value Date   INSULIN 8.2 07/12/2019   INSULIN 8.2 02/21/2019   Lab Results  Component Value Date   TSH 1.91 11/02/2019   Lab Results  Component Value Date   CHOL 148 11/02/2019   HDL 56.00 11/02/2019   LDLCALC 75 11/02/2019   LDLDIRECT 128.5 04/30/2008   TRIG 86.0 11/02/2019   CHOLHDL 3 11/02/2019   Lab Results  Component Value Date   WBC 4.5 11/02/2019   HGB 11.4 (L) 11/02/2019   HCT 33.9 (L) 11/02/2019   MCV 86.5 11/02/2019   PLT 239.0 11/02/2019   Lab Results  Component Value Date   IRON 63 02/21/2019   TIBC 282 02/21/2019   FERRITIN 45 02/21/2019   Attestation Statements:   Reviewed by clinician on day of visit: allergies, medications, problem list, medical history, surgical history, family history, social history, and previous encounter notes.   Wilhemena Durie, am acting as Location manager for [employer], FNP-C.  I have reviewed the above documentation for accuracy and completeness, and I agree with the above. -  Georgianne Fick, FNP

## 2020-01-17 ENCOUNTER — Other Ambulatory Visit: Payer: Self-pay

## 2020-01-17 ENCOUNTER — Ambulatory Visit (INDEPENDENT_AMBULATORY_CARE_PROVIDER_SITE_OTHER): Payer: Managed Care, Other (non HMO) | Admitting: Podiatry

## 2020-01-17 ENCOUNTER — Encounter: Payer: Self-pay | Admitting: Podiatry

## 2020-01-17 DIAGNOSIS — M722 Plantar fascial fibromatosis: Secondary | ICD-10-CM | POA: Diagnosis not present

## 2020-01-17 NOTE — Progress Notes (Signed)
Subjective:   Patient ID: [name], female   DOB: 59 y.o.   MRN: [id number]   HPI Patient states that her pain is improved but it still is quite sore in the medial band of the fascia and states it is worse when she gets up in the morning and after periods of sitting   ROS      Objective:  Physical Exam  Vascular status intact with inflammation of the plantar aspect of the right fascia that is improved slightly but present with obesity and flatfoot deformity as part of pathology     Assessment:  Acute plantar fasciitis right that is worse after sitting and when getting up the morning     Plan:  H&P sterile prep done and injected the plantar fascial right 3 mg Kenalog 5 mg Xylocaine applied night splint to support the arch and stretch the foot.  Reappoint for [place] to recheck again as needed and may require other treatment or orthotic treatment

## 2020-01-26 ENCOUNTER — Other Ambulatory Visit: Payer: Self-pay | Admitting: Family Medicine

## 2020-02-05 ENCOUNTER — Ambulatory Visit (INDEPENDENT_AMBULATORY_CARE_PROVIDER_SITE_OTHER): Payer: Managed Care, Other (non HMO) | Admitting: Family Medicine

## 2020-02-05 ENCOUNTER — Encounter (INDEPENDENT_AMBULATORY_CARE_PROVIDER_SITE_OTHER): Payer: Self-pay | Admitting: Family Medicine

## 2020-02-05 ENCOUNTER — Other Ambulatory Visit: Payer: Self-pay

## 2020-02-05 VITALS — BP 107/65 | HR 79 | Temp 98.3°F | Ht 68.0 in | Wt 297.0 lb

## 2020-02-05 DIAGNOSIS — E8881 Metabolic syndrome: Secondary | ICD-10-CM | POA: Diagnosis not present

## 2020-02-05 DIAGNOSIS — Z6841 Body Mass Index (BMI) 40.0 and over, adult: Secondary | ICD-10-CM

## 2020-02-05 DIAGNOSIS — E88819 Insulin resistance, unspecified: Secondary | ICD-10-CM

## 2020-02-06 ENCOUNTER — Encounter (INDEPENDENT_AMBULATORY_CARE_PROVIDER_SITE_OTHER): Payer: Self-pay | Admitting: Family Medicine

## 2020-02-06 NOTE — Progress Notes (Signed)
Chief Complaint:   OBESITY [name] is here to discuss her progress with her obesity treatment plan along with follow-up of her obesity related diagnoses. [name] is on the Category 1 Plan and states she is following her eating plan approximately 85% of the time. [name] states she is walking 15-20 minutes 5 times per week.  Today's visit was #: 46 Starting weight: 297 lbs Starting date: 02/21/2019 Today's weight: 297 lbs Today's date: 02/05/2020 Total lbs lost to date: 0 Total lbs lost since last in-office visit: 0  Interim History: [name] is not eating her evening meal, which is actually her "lunch." about 2 x per week.  She eats her largest meal at work.  She does not have much of an appetite overall and denies excessive snacking. She reports drinking Kool-Aid on the weekends whe she is visiting family.  Subjective:   Insulin resistance. [name] has a diagnosis of insulin resistance based on her elevated fasting insulin level >5.  [name] denies having much of an appetite and tends to skip dinner occasionally.  Lab Results  Component Value Date   INSULIN 8.2 07/12/2019   INSULIN 8.2 02/21/2019   Lab Results  Component Value Date   HGBA1C 4.8 07/12/2019   Assessment/Plan:   Insulin resistance. [name] will continue to work on weight loss, exercise, and decreasing simple carbohydrates to help decrease the risk of diabetes.  She was instructed to reduce meal skipping.  Class 3 severe obesity with serious comorbidity and body mass index (BMI) of 45.0 to 49.9 in adult, unspecified obesity type ([name]).  [name] is currently in the action stage of change. As such, her goal is to continue with weight loss efforts. She has agreed to the Category 1 Plan. She may have a protein shake rather than skipping her evening meal.  Exercise goals: [name] will continue her current exercise regimen.   Behavioral modification strategies: increasing lean protein intake, decreasing liquid  calories and no skipping meals.  [name] has agreed to follow-up with our clinic in 3 weeks.   Objective:   Blood pressure 107/65, pulse 79, temperature 98.3 F (36.8 C), height 5\' 8"  (1.727 m), weight 297 lb (134.7 kg), SpO2 98 %. Body mass index is 45.16 kg/m.  General: Cooperative, alert, well developed, in no acute distress. HEENT: Conjunctivae and lids unremarkable. Cardiovascular: Regular rhythm.  Lungs: Normal work of breathing. Neurologic: No focal deficits.   Lab Results  Component Value Date   CREATININE 1.05 11/02/2019   BUN 23 11/02/2019   NA 140 11/02/2019   K 3.7 11/02/2019   CL 104 11/02/2019   CO2 30 11/02/2019   Lab Results  Component Value Date   ALT 13 11/02/2019   AST 11 11/02/2019   ALKPHOS 47 11/02/2019   BILITOT 0.6 11/02/2019   Lab Results  Component Value Date   HGBA1C 4.8 07/12/2019   HGBA1C 5.4 02/21/2019   Lab Results  Component Value Date   INSULIN 8.2 07/12/2019   INSULIN 8.2 02/21/2019   Lab Results  Component Value Date   TSH 1.91 11/02/2019   Lab Results  Component Value Date   CHOL 148 11/02/2019   HDL 56.00 11/02/2019   LDLCALC 75 11/02/2019   LDLDIRECT 128.5 04/30/2008   TRIG 86.0 11/02/2019   CHOLHDL 3 11/02/2019   Lab Results  Component Value Date   WBC 4.5 11/02/2019   HGB 11.4 (L) 11/02/2019   HCT 33.9 (L) 11/02/2019   MCV 86.5 11/02/2019   PLT  239.0 11/02/2019   Lab Results  Component Value Date   IRON 63 02/21/2019   TIBC 282 02/21/2019   FERRITIN 45 02/21/2019   Attestation Statements:   Reviewed by clinician on day of visit: allergies, medications, problem list, medical history, surgical history, family history, social history, and previous encounter notes.  IMichaelene Song, am acting as Location manager for [employer], FNP-C   I have reviewed the above documentation for accuracy and completeness, and I agree with the above. - Georgianne Fick, FNP

## 2020-02-20 ENCOUNTER — Ambulatory Visit: Payer: Managed Care, Other (non HMO) | Admitting: Podiatry

## 2020-02-27 ENCOUNTER — Encounter (INDEPENDENT_AMBULATORY_CARE_PROVIDER_SITE_OTHER): Payer: Self-pay | Admitting: Family Medicine

## 2020-02-27 ENCOUNTER — Other Ambulatory Visit: Payer: Self-pay

## 2020-02-27 ENCOUNTER — Ambulatory Visit (INDEPENDENT_AMBULATORY_CARE_PROVIDER_SITE_OTHER): Payer: Managed Care, Other (non HMO) | Admitting: Family Medicine

## 2020-02-27 VITALS — BP 134/79 | HR 80 | Temp 97.9°F | Ht 68.0 in | Wt 300.0 lb

## 2020-02-27 DIAGNOSIS — Z6841 Body Mass Index (BMI) 40.0 and over, adult: Secondary | ICD-10-CM

## 2020-02-27 DIAGNOSIS — E8881 Metabolic syndrome: Secondary | ICD-10-CM

## 2020-02-27 DIAGNOSIS — R609 Edema, unspecified: Secondary | ICD-10-CM

## 2020-02-27 DIAGNOSIS — R600 Localized edema: Secondary | ICD-10-CM | POA: Insufficient documentation

## 2020-02-27 MED ORDER — FUROSEMIDE 40 MG PO TABS: ORAL_TABLET | ORAL | 0 refills | Status: DC

## 2020-02-28 ENCOUNTER — Ambulatory Visit (INDEPENDENT_AMBULATORY_CARE_PROVIDER_SITE_OTHER): Payer: Managed Care, Other (non HMO) | Admitting: Podiatry

## 2020-02-28 ENCOUNTER — Encounter: Payer: Self-pay | Admitting: Podiatry

## 2020-02-28 DIAGNOSIS — M722 Plantar fascial fibromatosis: Secondary | ICD-10-CM | POA: Diagnosis not present

## 2020-02-28 NOTE — Progress Notes (Signed)
Chief Complaint:   OBESITY [name] is here to discuss her progress with her obesity treatment plan along with follow-up of her obesity related diagnoses. [name] is on the Category 1 Plan and states she is following her eating plan approximately 100% of the time. Narcissus states she is walking for 30 minutes 5 times per week.  Today's visit was #: 18 Starting weight: 297 lbs Starting date: 02/21/2019 Today's weight: 300 lbs Today's date: 02/27/2020 Total lbs lost to date: 0 Total lbs lost since last in-office visit: 0  Interim History: [name] has cut out kool-aid. She is having a [employer] shake rather than supper when she works late because she doesn't feel like eating.  She eats her large meal at lunch (dinner from the plan) at the cafeteria at work and reports sticking to vegetables and meat, although it is unknown how they are cooked.  She denies overeating snack calories. I am unable to ascertain why she is not losing weight. She tracked her calories and reports about 1100 calories per day but I feel she does not track all calories. She denies snacking between meals. She has started having 8 oz of orange juice a few times per week.  Subjective:   1. Insulin resistance [name] denies having much of a hunger drive. She denies cravings for the most part. She is not on metformin.   Lab Results  Component Value Date   INSULIN 8.2 07/12/2019   INSULIN 8.2 02/21/2019   Lab Results  Component Value Date   HGBA1C 4.8 07/12/2019   2. Peripheral edema [name] notes some swelling around the time she would usually have her period, though she is post-menopausal.   Assessment/Plan:   1. Insulin resistance [name]  will continue to work on decreasing simple carbohydrates to help decrease the risk of diabetes.  2. Peripheral edema We will refill Lasix for 1 month, and [name] will continue to follow up as directed.  - furosemide (LASIX) 40 MG tablet; 1 every 3 days if needed.  Dispense: 30  tablet; Refill: 0  3. Class 3 severe obesity with serious comorbidity and body mass index (BMI) of 45.0 to 49.9 in adult, unspecified obesity type [facility]) [name] is currently in the action stage of change. As such, her goal is to continue with weight loss efforts. She has agreed to the Category 1 Plan or keeping a food journal and adhering to recommended goals of 1050-1150 calories and 75 grams of protein daily.  We discussed she must track everything, including beverages such as juice.   Exercise goals: Add 30 minutes of walking 2 times per week.  Behavioral modification strategies: increasing lean protein intake, holiday eating strategies  and keeping a strict food journal.  [name] has agreed to follow-up with our clinic in 3 weeks.   Objective:   Blood pressure 134/79, pulse 80, temperature 97.9 F (36.6 C), temperature source Oral, height 5\' 8"  (1.727 m), weight 300 lb (136.1 kg), SpO2 96 %. Body mass index is 45.61 kg/m.  General: Cooperative, alert, well developed, in no acute distress. HEENT: Conjunctivae and lids unremarkable. Cardiovascular: Regular rhythm.  Lungs: Normal work of breathing. Neurologic: No focal deficits.   Lab Results  Component Value Date   CREATININE 1.05 11/02/2019   BUN 23 11/02/2019   NA 140 11/02/2019   K 3.7 11/02/2019   CL 104 11/02/2019   CO2 30 11/02/2019   Lab Results  Component Value Date   ALT 13 11/02/2019   AST 11  11/02/2019   ALKPHOS 47 11/02/2019   BILITOT 0.6 11/02/2019   Lab Results  Component Value Date   HGBA1C 4.8 07/12/2019   HGBA1C 5.4 02/21/2019   Lab Results  Component Value Date   INSULIN 8.2 07/12/2019   INSULIN 8.2 02/21/2019   Lab Results  Component Value Date   TSH 1.91 11/02/2019   Lab Results  Component Value Date   CHOL 148 11/02/2019   HDL 56.00 11/02/2019   LDLCALC 75 11/02/2019   LDLDIRECT 128.5 04/30/2008   TRIG 86.0 11/02/2019   CHOLHDL 3 11/02/2019   Lab Results  Component Value Date    WBC 4.5 11/02/2019   HGB 11.4 (L) 11/02/2019   HCT 33.9 (L) 11/02/2019   MCV 86.5 11/02/2019   PLT 239.0 11/02/2019   Lab Results  Component Value Date   IRON 63 02/21/2019   TIBC 282 02/21/2019   FERRITIN 45 02/21/2019   Attestation Statements:   Reviewed by clinician on day of visit: allergies, medications, problem list, medical history, surgical history, family history, social history, and previous encounter notes.   Wilhemena Durie, am acting as Location manager for [employer], FNP-C.  I have reviewed the above documentation for accuracy and completeness, and I agree with the above. -  Georgianne Fick, FNP

## 2020-02-29 NOTE — Progress Notes (Signed)
Subjective:   Patient ID: [name], female   DOB: 59 y.o.   MRN: [id number]   HPI Patient states she continues to have pain in the right heel but states it is improved and she is trying to use the night splint as best as possible but needs brace therapy in order to support her arch and the other one has lost its ability to hold it up.  Has been trying to wear good shoes    ROS      Objective:  Physical Exam  Neurovascular status intact with patient having moderate collapse medial longitudinal arch and is noted to have discomfort in the plantar heel right that is present with improvement but pain still noted of a mild nature     Assessment:  Patient has structural changes creating a lot of the problems with fasciitis-like symptomatology     Plan:  Reviewed condition explaining this to her and at this point I did go ahead and I did dispense a new fascial brace with instructions on usage along with shoe gear modifications topical medicines oral medicines as needed and will be seen back if symptoms persist or get worse and continue night splint at this time

## 2020-03-03 ENCOUNTER — Encounter (INDEPENDENT_AMBULATORY_CARE_PROVIDER_SITE_OTHER): Payer: Self-pay | Admitting: Family Medicine

## 2020-03-19 ENCOUNTER — Other Ambulatory Visit: Payer: Self-pay

## 2020-03-19 ENCOUNTER — Encounter (INDEPENDENT_AMBULATORY_CARE_PROVIDER_SITE_OTHER): Payer: Self-pay | Admitting: Family Medicine

## 2020-03-19 ENCOUNTER — Ambulatory Visit (INDEPENDENT_AMBULATORY_CARE_PROVIDER_SITE_OTHER): Payer: Managed Care, Other (non HMO) | Admitting: Family Medicine

## 2020-03-19 VITALS — BP 132/78 | HR 74 | Temp 97.9°F | Ht 68.0 in | Wt 300.0 lb

## 2020-03-19 DIAGNOSIS — Z6841 Body Mass Index (BMI) 40.0 and over, adult: Secondary | ICD-10-CM | POA: Diagnosis not present

## 2020-03-19 DIAGNOSIS — E8881 Metabolic syndrome: Secondary | ICD-10-CM | POA: Diagnosis not present

## 2020-03-20 ENCOUNTER — Encounter (INDEPENDENT_AMBULATORY_CARE_PROVIDER_SITE_OTHER): Payer: Self-pay | Admitting: Family Medicine

## 2020-03-20 NOTE — Progress Notes (Signed)
Chief Complaint:   OBESITY [name] is here to discuss her progress with her obesity treatment plan along with follow-up of her obesity related diagnoses. [name] is on keeping a food journal and adhering to recommended goals of 1050-1150 calories and 75 grams of protein and states she is following her eating plan approximately 0% of the time. [name] states she is walking for 30 minutes 7 times per week.  Today's visit was #: 37 Starting weight: 297 lbs Starting date: 02/21/2019 Today's weight: 300 lbs Today's date: 03/19/2020 Total lbs lost to date: 0 Total lbs lost since last in-office visit: 0  Interim History: [name] has maintained her weight over the Thanksgiving holiday through portion control.  Journal review reveals she is eating up to 800 calories at breakfast.  She is now journaling an average of 1100-1500 calories per day and 38-90 grams of protein per day.  She is journaling fairly consistently.  Subjective:   1. Insulin resistance Denies polyphagia.  She is not on metformin.  Lab Results  Component Value Date   INSULIN 8.2 07/12/2019   INSULIN 8.2 02/21/2019   Lab Results  Component Value Date   HGBA1C 4.8 07/12/2019   Assessment/Plan:   1. Insulin resistance Continue meal plan.  2. Class 3 severe obesity with serious comorbidity and body mass index (BMI) of 45.0 to 49.9 in adult, unspecified obesity type [facility])  [name] is currently in the action stage of change. As such, her goal is to continue with weight loss efforts. She has agreed to keeping a food journal and adhering to recommended goals of 1050-1150 calories and 75 grams of protein daily.  She will work on increasing protein and spreading calories throughout the day.  Exercise goals: As is.  Behavioral modification strategies: increasing lean protein intake, decreasing simple carbohydrates and planning for success.  [name] has agreed to follow-up with our clinic in 3 weeks.   Objective:   Blood  pressure 132/78, pulse 74, temperature 97.9 F (36.6 C), height 5\' 8"  (1.727 m), weight 300 lb (136.1 kg), SpO2 100 %. Body mass index is 45.61 kg/m.  General: Cooperative, alert, well developed, in no acute distress. HEENT: Conjunctivae and lids unremarkable. Cardiovascular: Regular rhythm.  Lungs: Normal work of breathing. Neurologic: No focal deficits.   Lab Results  Component Value Date   CREATININE 1.05 11/02/2019   BUN 23 11/02/2019   NA 140 11/02/2019   K 3.7 11/02/2019   CL 104 11/02/2019   CO2 30 11/02/2019   Lab Results  Component Value Date   ALT 13 11/02/2019   AST 11 11/02/2019   ALKPHOS 47 11/02/2019   BILITOT 0.6 11/02/2019   Lab Results  Component Value Date   HGBA1C 4.8 07/12/2019   HGBA1C 5.4 02/21/2019   Lab Results  Component Value Date   INSULIN 8.2 07/12/2019   INSULIN 8.2 02/21/2019   Lab Results  Component Value Date   TSH 1.91 11/02/2019   Lab Results  Component Value Date   CHOL 148 11/02/2019   HDL 56.00 11/02/2019   LDLCALC 75 11/02/2019   LDLDIRECT 128.5 04/30/2008   TRIG 86.0 11/02/2019   CHOLHDL 3 11/02/2019   Lab Results  Component Value Date   WBC 4.5 11/02/2019   HGB 11.4 (L) 11/02/2019   HCT 33.9 (L) 11/02/2019   MCV 86.5 11/02/2019   PLT 239.0 11/02/2019   Lab Results  Component Value Date   IRON 63 02/21/2019   TIBC 282 02/21/2019   FERRITIN  45 02/21/2019   Attestation Statements:   Reviewed by clinician on day of visit: allergies, medications, problem list, medical history, surgical history, family history, social history, and previous encounter notes.  I, Water quality scientist, CMA, am acting as Location manager for [employer], [place].  I have reviewed the above documentation for accuracy and completeness, and I agree with the above. -  Georgianne Fick, FNP

## 2020-04-09 ENCOUNTER — Ambulatory Visit (INDEPENDENT_AMBULATORY_CARE_PROVIDER_SITE_OTHER): Payer: Managed Care, Other (non HMO) | Admitting: Family Medicine

## 2020-04-09 ENCOUNTER — Other Ambulatory Visit: Payer: Self-pay

## 2020-04-09 ENCOUNTER — Encounter (INDEPENDENT_AMBULATORY_CARE_PROVIDER_SITE_OTHER): Payer: Self-pay | Admitting: Family Medicine

## 2020-04-09 VITALS — BP 117/74 | HR 81 | Temp 97.8°F | Ht 68.0 in | Wt 299.0 lb

## 2020-04-09 DIAGNOSIS — Z6841 Body Mass Index (BMI) 40.0 and over, adult: Secondary | ICD-10-CM | POA: Diagnosis not present

## 2020-04-09 DIAGNOSIS — G4733 Obstructive sleep apnea (adult) (pediatric): Secondary | ICD-10-CM

## 2020-04-09 DIAGNOSIS — Z9989 Dependence on other enabling machines and devices: Secondary | ICD-10-CM | POA: Diagnosis not present

## 2020-04-09 NOTE — Progress Notes (Signed)
Chief Complaint:   OBESITY [name] is here to discuss her progress with her obesity treatment plan along with follow-up of her obesity related diagnoses. [name] is on keeping a food journal and adhering to recommended goals of 1050-1150 calories and 75 grams of protein and states she is following her eating plan approximately 85% of the time. [name] states she is walking for 30 minutes 7 times per week.  Today's visit was #: 20 Starting weight: 297 lbs Starting date: 02/21/2019 Today's weight: 299 lbs Today's date: 04/09/2020 Total lbs lost to date: 0 Total lbs lost since last in-office visit: 1 lb  Interim History: [name] notes she is still going over in calories, averaging 1200 calories per day.  She is meeting protein goals.  Appetite is satisfied.  She is now spreading calories out throughout the day rather than having the majority of calories at breakfast.  She does walking videos everyday.  She is drinking mostly water.  She usually has 2 eggs at breakfast, grits, bacon (400 calories). I am unsure if she is tracking all of her food. She is up 2 lbs from her starting weight over a year ago.  She did not lose weight on category 1 plan either.  Subjective:   1. OSA on CPAP Uses CPAP most nights when her sinuses are not "acting up".  She wears it all night.  She notes feeling better rested when she uses it.  Assessment/Plan:   1. OSA on CPAP Continue good adherence with CPAP.  2. Class 3 severe obesity with serious comorbidity and body mass index (BMI) of 45.0 to 49.9 in adult, unspecified obesity type [name])  [name] is currently in the action stage of change. As such, her goal is to continue with weight loss efforts. She has agreed to keeping a food journal and adhering to recommended goals of 1050-1150 calories and 75 grams of protein daily.   Exercise goals: Increase intensity of workout.  Behavioral modification strategies: increasing lean protein intake, decreasing simple  carbohydrates and keeping a strict food journal. We discussed she must track every thing that she eats including drinks with calories, condiments, etc.   [name] has agreed to follow-up with our clinic in 3 weeks, fasting. She was informed of the importance of frequent follow-up visits to maximize her success with intensive lifestyle modifications for her multiple health conditions.   Objective:   Blood pressure 117/74, pulse 81, temperature 97.8 F (36.6 C), height 5\' 8"  (1.727 m), weight 299 lb (135.6 kg), SpO2 100 %. Body mass index is 45.46 kg/m.  General: Cooperative, alert, well developed, in no acute distress. HEENT: Conjunctivae and lids unremarkable. Cardiovascular: Regular rhythm.  Lungs: Normal work of breathing. Neurologic: No focal deficits.   Lab Results  Component Value Date   CREATININE 1.05 11/02/2019   BUN 23 11/02/2019   NA 140 11/02/2019   K 3.7 11/02/2019   CL 104 11/02/2019   CO2 30 11/02/2019   Lab Results  Component Value Date   ALT 13 11/02/2019   AST 11 11/02/2019   ALKPHOS 47 11/02/2019   BILITOT 0.6 11/02/2019   Lab Results  Component Value Date   HGBA1C 4.8 07/12/2019   HGBA1C 5.4 02/21/2019   Lab Results  Component Value Date   INSULIN 8.2 07/12/2019   INSULIN 8.2 02/21/2019   Lab Results  Component Value Date   TSH 1.91 11/02/2019   Lab Results  Component Value Date   CHOL 148 11/02/2019   HDL  56.00 11/02/2019   LDLCALC 75 11/02/2019   LDLDIRECT 128.5 04/30/2008   TRIG 86.0 11/02/2019   CHOLHDL 3 11/02/2019   Lab Results  Component Value Date   WBC 4.5 11/02/2019   HGB 11.4 (L) 11/02/2019   HCT 33.9 (L) 11/02/2019   MCV 86.5 11/02/2019   PLT 239.0 11/02/2019   Lab Results  Component Value Date   IRON 63 02/21/2019   TIBC 282 02/21/2019   FERRITIN 45 02/21/2019   Attestation Statements:   Reviewed by clinician on day of visit: allergies, medications, problem list, medical history, surgical history, family history,  social history, and previous encounter notes.  I, Water quality scientist, CMA, am acting as Location manager for [employer], [place].  I have reviewed the above documentation for accuracy and completeness, and I agree with the above. -  Georgianne Fick, FNP

## 2020-05-05 ENCOUNTER — Encounter: Payer: Self-pay | Admitting: Family Medicine

## 2020-05-05 ENCOUNTER — Telehealth (INDEPENDENT_AMBULATORY_CARE_PROVIDER_SITE_OTHER): Payer: Managed Care, Other (non HMO) | Admitting: Family Medicine

## 2020-05-05 DIAGNOSIS — E876 Hypokalemia: Secondary | ICD-10-CM | POA: Diagnosis not present

## 2020-05-05 DIAGNOSIS — J302 Other seasonal allergic rhinitis: Secondary | ICD-10-CM

## 2020-05-05 DIAGNOSIS — E039 Hypothyroidism, unspecified: Secondary | ICD-10-CM | POA: Diagnosis not present

## 2020-05-05 DIAGNOSIS — R1013 Epigastric pain: Secondary | ICD-10-CM

## 2020-05-05 DIAGNOSIS — R21 Rash and other nonspecific skin eruption: Secondary | ICD-10-CM

## 2020-05-05 DIAGNOSIS — I10 Essential (primary) hypertension: Secondary | ICD-10-CM

## 2020-05-05 MED ORDER — POTASSIUM CHLORIDE CRYS ER 10 MEQ PO TBCR: 20.0000 meq | EXTENDED_RELEASE_TABLET | Freq: Two times a day (BID) | ORAL | 5 refills | Status: DC

## 2020-05-05 MED ORDER — LEVOTHYROXINE SODIUM 50 MCG PO TABS: 50.0000 ug | ORAL_TABLET | Freq: Every day | ORAL | 1 refills | Status: DC

## 2020-05-05 MED ORDER — FLUTICASONE PROPIONATE 50 MCG/ACT NA SUSP: NASAL | 2 refills | Status: DC

## 2020-05-05 MED ORDER — CLOTRIMAZOLE-BETAMETHASONE 1-0.05 % EX CREA: 1.0000 "application " | TOPICAL_CREAM | Freq: Two times a day (BID) | CUTANEOUS | 0 refills | Status: DC

## 2020-05-05 MED ORDER — HYDROCHLOROTHIAZIDE 25 MG PO TABS: 25.0000 mg | ORAL_TABLET | Freq: Every day | ORAL | 0 refills | Status: DC

## 2020-05-05 MED ORDER — OMEPRAZOLE 20 MG PO CPDR: 20.0000 mg | DELAYED_RELEASE_CAPSULE | Freq: Every day | ORAL | 1 refills | Status: DC

## 2020-05-05 NOTE — Progress Notes (Signed)
Virtual Visit via Video Note  I connected with [name] on 05/05/20 at  1:20 PM EST by a video enabled telemedicine application and verified that I am speaking with the correct person using two identifiers.  Location: Patient:at work-- alone  Provider: home    I discussed the limitations of evaluation and management by telemedicine and the availability of in person appointments. The patient expressed understanding and agreed to proceed.  History of Present Illness: Pt is home and needs f/u bp , chol and thyroid   Pt has no complaints    Observations/Objective: There were no vitals filed for this visit. bp cuff at work was not working  Pt in nad  No sob   Assessment and Plan: 1. Rash Stable Refill cream for prn use - clotrimazole-betamethasone (LOTRISONE) cream; Apply 1 application topically 2 (two) times daily.  Dispense: 30 g; Refill: 0  2. Seasonal allergies stable - fluticasone (FLONASE) 50 MCG/ACT nasal spray; INSTILL TWO SPRAYS INTO BOTH NOSTRIL DAILY.  Dispense: 16 g; Refill: 2  3. Hypokalemia Check labs  - potassium chloride (KLOR-CON) 10 MEQ tablet; Take 2 tablets (20 mEq total) by mouth 2 (two) times daily.  Dispense: 120 tablet; Refill: 5 - Lipid panel; Future - Comprehensive metabolic panel; Future - TSH; Future  4. Dyspepsia Stable Refill med  - omeprazole (PRILOSEC) 20 MG capsule; Take 1 capsule (20 mg total) by mouth daily.  Dispense: 90 capsule; Refill: 1  5. Essential hypertension Well controlled, no changes to meds. Encouraged heart healthy diet such as the DASH diet and exercise as tolerated. -- bp good per pt  - hydrochlorothiazide (HYDRODIURIL) 25 MG tablet; Take 1 tablet (25 mg total) by mouth daily.  Dispense: 90 tablet; Refill: 0 - Lipid panel; Future - Comprehensive metabolic panel; Future - TSH; Future  6. Hypothyroidism, unspecified type .check labs  - levothyroxine (SYNTHROID) 50 MCG tablet; Take 1 tablet (50 mcg total) by mouth daily  with breakfast.  Dispense: 90 tablet; Refill: 1 - Lipid panel; Future - Comprehensive metabolic panel; Future - TSH; Future   Follow Up Instructions:    I discussed the assessment and treatment plan with the patient. The patient was provided an opportunity to ask questions and all were answered. The patient agreed with the plan and demonstrated an understanding of the instructions.   The patient was advised to call back or seek an in-person evaluation if the symptoms worsen or if the condition fails to improve as anticipated.  I provided 25 minutes of non-face-to-face time during this encounter.   [name], DO

## 2020-05-07 ENCOUNTER — Encounter (INDEPENDENT_AMBULATORY_CARE_PROVIDER_SITE_OTHER): Payer: Self-pay | Admitting: Family Medicine

## 2020-05-07 ENCOUNTER — Telehealth (INDEPENDENT_AMBULATORY_CARE_PROVIDER_SITE_OTHER): Payer: Managed Care, Other (non HMO) | Admitting: Family Medicine

## 2020-05-07 ENCOUNTER — Other Ambulatory Visit: Payer: Self-pay

## 2020-05-07 DIAGNOSIS — E8881 Metabolic syndrome: Secondary | ICD-10-CM | POA: Diagnosis not present

## 2020-05-07 DIAGNOSIS — Z6841 Body Mass Index (BMI) 40.0 and over, adult: Secondary | ICD-10-CM | POA: Diagnosis not present

## 2020-05-07 DIAGNOSIS — E88819 Insulin resistance, unspecified: Secondary | ICD-10-CM

## 2020-05-08 NOTE — Progress Notes (Signed)
TeleHealth Visit:  Due to the COVID-19 pandemic, this visit was completed with telemedicine (audio/video) technology to reduce patient and provider exposure as well as to preserve personal protective equipment.   [name] has verbally consented to this TeleHealth visit. The patient is located at home, the provider is located at the [facility] office. The participants in this visit include the listed provider and patient. The visit was conducted today via MyChart video.   Chief Complaint: OBESITY [name] is here to discuss her progress with her obesity treatment plan along with follow-up of her obesity related diagnoses. [name] is on keeping a food journal and adhering to recommended goals of 1050-1150 calories and 75 grams of protein daily and states she is following her eating plan approximately 75-80% of the time. [name] states she is walking and doing [employer] for 30 minutes 7 times per week.  Today's visit was #: 21 Starting weight: 297 lbs Starting date: 02/21/2019  Interim History: [name] is doing a [name] fast currently (no meat or eggs), and she is trying to getting protein in with nuts and protein oatmeal. She is tracking calories and averaging at 1100 calories per day. She is not paying attention to protein.Her [name] fast will end on Sunday. She notes she drinks  Juice occasionally.  Subjective:   1. Insulin resistance [name] is not on metformin, and she denies polyphagia.   Lab Results  Component Value Date   INSULIN 8.2 07/12/2019   INSULIN 8.2 02/21/2019   Lab Results  Component Value Date   HGBA1C 4.8 07/12/2019   Assessment/Plan:   1. Insulin resistance [name] will continue her meal plan, and she will decrease sugar sweetened beverages.[name] agreed to follow-up with [place] as directed to closely monitor her progress.  2. Class 3 severe obesity with serious comorbidity and body mass index (BMI) of 45.0 to 49.9 in adult, unspecified obesity type  [name]) [name] is currently in the action stage of change. As such, her goal is to continue with weight loss efforts. She has agreed to keeping a food journal and adhering to recommended goals of 1050-1150 calories and 75 grams of protein.   [name] was advised to trade everything: juice, nuts, added to oatmeal, oils, etc. She will star tracking and paying attention to protein intake.  Exercise goals: As is.  Behavioral modification strategies: decreasing simple carbohydrates and decreasing liquid calories.  [name] has agreed to follow-up with our clinic in 2 to 3 weeks.  Objective:   VITALS: Per patient if applicable, see vitals. GENERAL: Alert and in no acute distress. CARDIOPULMONARY: No increased WOB. Speaking in clear sentences.  PSYCH: Pleasant and cooperative. Speech normal rate and rhythm. Affect is appropriate. Insight and judgement are appropriate. Attention is focused, linear, and appropriate.  NEURO: Oriented as arrived to appointment on time with no prompting.   Lab Results  Component Value Date   CREATININE 1.05 11/02/2019   BUN 23 11/02/2019   NA 140 11/02/2019   K 3.7 11/02/2019   CL 104 11/02/2019   CO2 30 11/02/2019   Lab Results  Component Value Date   ALT 13 11/02/2019   AST 11 11/02/2019   ALKPHOS 47 11/02/2019   BILITOT 0.6 11/02/2019   Lab Results  Component Value Date   HGBA1C 4.8 07/12/2019   HGBA1C 5.4 02/21/2019   Lab Results  Component Value Date   INSULIN 8.2 07/12/2019   INSULIN 8.2 02/21/2019   Lab Results  Component Value Date   TSH  1.91 11/02/2019   Lab Results  Component Value Date   CHOL 148 11/02/2019   HDL 56.00 11/02/2019   LDLCALC 75 11/02/2019   LDLDIRECT 128.5 04/30/2008   TRIG 86.0 11/02/2019   CHOLHDL 3 11/02/2019   Lab Results  Component Value Date   WBC 4.5 11/02/2019   HGB 11.4 (L) 11/02/2019   HCT 33.9 (L) 11/02/2019   MCV 86.5 11/02/2019   PLT 239.0 11/02/2019   Lab Results  Component Value Date   IRON  63 02/21/2019   TIBC 282 02/21/2019   FERRITIN 45 02/21/2019    Attestation Statements:   Reviewed by clinician on day of visit: allergies, medications, problem list, medical history, surgical history, family history, social history, and previous encounter notes.   Wilhemena Durie, am acting as Location manager for [employer], FNP-C.  I have reviewed the above documentation for accuracy and completeness, and I agree with the above. - Georgianne Fick, FNP

## 2020-05-10 ENCOUNTER — Encounter (INDEPENDENT_AMBULATORY_CARE_PROVIDER_SITE_OTHER): Payer: Self-pay | Admitting: Family Medicine

## 2020-05-14 ENCOUNTER — Other Ambulatory Visit (INDEPENDENT_AMBULATORY_CARE_PROVIDER_SITE_OTHER): Payer: Managed Care, Other (non HMO)

## 2020-05-14 ENCOUNTER — Other Ambulatory Visit: Payer: Self-pay

## 2020-05-14 DIAGNOSIS — E876 Hypokalemia: Secondary | ICD-10-CM | POA: Diagnosis not present

## 2020-05-14 DIAGNOSIS — I10 Essential (primary) hypertension: Secondary | ICD-10-CM | POA: Diagnosis not present

## 2020-05-14 DIAGNOSIS — E039 Hypothyroidism, unspecified: Secondary | ICD-10-CM

## 2020-05-14 LAB — COMPREHENSIVE METABOLIC PANEL
ALT: 19 U/L (ref 0–35)
AST: 18 U/L (ref 0–37)
Albumin: 4 g/dL (ref 3.5–5.2)
Alkaline Phosphatase: 59 U/L (ref 39–117)
BUN: 22 mg/dL (ref 6–23)
CO2: 27 mEq/L (ref 19–32)
Calcium: 9.3 mg/dL (ref 8.4–10.5)
Chloride: 105 mEq/L (ref 96–112)
Creatinine, Ser: 0.89 mg/dL (ref 0.40–1.20)
GFR: 71 mL/min (ref >60.00)
Glucose, Bld: 79 mg/dL (ref 70–99)
Potassium: 4.3 mEq/L (ref 3.5–5.1)
Sodium: 139 mEq/L (ref 135–145)
Total Bilirubin: 0.7 mg/dL (ref 0.2–1.2)
Total Protein: 6.9 g/dL (ref 6.0–8.3)

## 2020-05-14 LAB — LIPID PANEL
Cholesterol: 167 mg/dL (ref 0–200)
HDL: 60.5 mg/dL (ref >39.00)
LDL Cholesterol: 94 mg/dL (ref 0–99)
NonHDL: 106.23
Total CHOL/HDL Ratio: 3
Triglycerides: 60 mg/dL (ref 0.0–149.0)
VLDL: 12 mg/dL (ref 0.0–40.0)

## 2020-05-14 LAB — TSH: TSH: 3.54 u[IU]/mL (ref 0.35–4.50)

## 2020-05-28 ENCOUNTER — Encounter (INDEPENDENT_AMBULATORY_CARE_PROVIDER_SITE_OTHER): Payer: Self-pay | Admitting: Family Medicine

## 2020-05-28 ENCOUNTER — Ambulatory Visit (INDEPENDENT_AMBULATORY_CARE_PROVIDER_SITE_OTHER): Payer: Managed Care, Other (non HMO) | Admitting: Family Medicine

## 2020-05-28 ENCOUNTER — Other Ambulatory Visit: Payer: Self-pay

## 2020-05-28 VITALS — BP 126/63 | HR 78 | Temp 97.8°F | Ht 68.0 in | Wt 302.0 lb

## 2020-05-28 DIAGNOSIS — E559 Vitamin D deficiency, unspecified: Secondary | ICD-10-CM

## 2020-05-28 DIAGNOSIS — Z9189 Other specified personal risk factors, not elsewhere classified: Secondary | ICD-10-CM | POA: Diagnosis not present

## 2020-05-28 DIAGNOSIS — Z6841 Body Mass Index (BMI) 40.0 and over, adult: Secondary | ICD-10-CM | POA: Diagnosis not present

## 2020-05-28 DIAGNOSIS — E8881 Metabolic syndrome: Secondary | ICD-10-CM

## 2020-05-28 DIAGNOSIS — E88819 Insulin resistance, unspecified: Secondary | ICD-10-CM

## 2020-05-29 ENCOUNTER — Encounter (INDEPENDENT_AMBULATORY_CARE_PROVIDER_SITE_OTHER): Payer: Self-pay | Admitting: Family Medicine

## 2020-05-29 LAB — VITAMIN D 25 HYDROXY (VIT D DEFICIENCY, FRACTURES): Vit D, 25-Hydroxy: 49.5 ng/mL (ref 30.0–100.0)

## 2020-05-29 LAB — INSULIN, RANDOM: INSULIN: 14.2 u[IU]/mL (ref 2.6–24.9)

## 2020-05-29 LAB — HEMOGLOBIN A1C
Est. average glucose Bld gHb Est-mCnc: 97 mg/dL
Hgb A1c MFr Bld: 5 % (ref 4.8–5.6)

## 2020-05-29 NOTE — Progress Notes (Signed)
Chief Complaint:   OBESITY [name] is here to discuss her progress with her obesity treatment plan along with follow-up of her obesity related diagnoses. [name] is on keeping a food journal and adhering to recommended goals of 1050-1150 calories and 75 grams of protein daily and states she is following her eating plan approximately 80% of the time. [name] states she is walking for 30 minutes 7 times per week.  Today's visit was #: 22 Starting weight: 297 lbs Starting date: 02/21/2019 Today's weight: 302 lbs Today's date: 05/28/2020 Total lbs lost to date: 0 Total lbs lost since last in-office visit: 0  Interim History: [name] is doing a challenge at work to increase fruits and vegetables. She is journaling and averages 75 grams protein per day. She sometimes runs over on calories. She gets 6500 steps 5 days per week. Reviewed her My[employer]itness Pal log, and her protein ranges between 65-90 grams and calories 1100-1400 per day. She does not journal on the weekends but says that she does not eat more on those days. She tends to eat most of her food early in the day. She says she tracks all food. However she eats at the cafeteria at her job which requires her to estimate serving size and how to enter in M[employer]. She denies intake of sugar sweetened beverages.   Subjective:   1. Insulin resistance Harvey denies polyphagia, and she is not on metformin.   Lab Results  Component Value Date   INSULIN 8.2 07/12/2019   INSULIN 8.2 02/21/2019   Lab Results  Component Value Date   HGBA1C 4.8 07/12/2019   2. Vitamin D deficiency Eliot is on OTC Vit D 2,000 IU daily. Last Vit D level was at goal at 49.  3. At risk for malnutrition [name] is at increased risk for malnutrition due to insufficient protein.  Assessment/Plan:   1. Insulin resistance Infantof will continue to work on weight loss, exercise, and decreasing simple carbohydrates to help decrease the risk of diabetes. We will check labs  today.  - Hemoglobin A1c - Insulin, random  2. Vitamin D deficiency  We will check labs today. [name] will follow-up for routine testing of Vitamin D, at least 2-3 times per year to avoid over-replacement.  - VITAMIN D 25 Hydroxy (Vit-D Deficiency, Fractures)  3. At risk for malnutrition [name] was given approximately 15 minutes of counseling today regarding prevention of malnutrition and ways to meet macronutrient goals..   4. Class 3 severe obesity with serious comorbidity and body mass index (BMI) of 45.0 to 49.9 in adult, unspecified obesity type [facility]) [name] is currently in the action stage of change. As such, her goal is to continue with weight loss efforts. She has agreed to keeping a food journal and adhering to recommended goals of 1050-1150 calories and 75 grams of protein daily.   [name] will switch to Category 1 after her challenge at work. Increase vegetables rather than fruit during the challenge.  Exercise goals: As is.  Behavioral modification strategies: increasing lean protein intake, decreasing simple carbohydrates and better snacking choices.  Ardyce has agreed to follow-up with our clinic in 3 weeks.  [name] was informed we would discuss her lab results at her next visit unless there is a critical issue that needs to be addressed sooner. [name] agreed to keep her next visit at the agreed upon time to discuss these results.  Objective:   Blood pressure 126/63, pulse 78, temperature 97.8 F (36.6 C), height 5\' 8"  (  1.727 m), weight (!) 302 lb (137 kg), SpO2 97 %. Body mass index is 45.92 kg/m.  General: Cooperative, alert, well developed, in no acute distress. HEENT: Conjunctivae and lids unremarkable. Cardiovascular: Regular rhythm.  Lungs: Normal work of breathing. Neurologic: No focal deficits.   Lab Results  Component Value Date   CREATININE 0.89 05/14/2020   BUN 22 05/14/2020   NA 139 05/14/2020   K 4.3 05/14/2020   CL 105 05/14/2020   CO2 27  05/14/2020   Lab Results  Component Value Date   ALT 19 05/14/2020   AST 18 05/14/2020   ALKPHOS 59 05/14/2020   BILITOT 0.7 05/14/2020   Lab Results  Component Value Date   HGBA1C 4.8 07/12/2019   HGBA1C 5.4 02/21/2019   Lab Results  Component Value Date   INSULIN 8.2 07/12/2019   INSULIN 8.2 02/21/2019   Lab Results  Component Value Date   TSH 3.54 05/14/2020   Lab Results  Component Value Date   CHOL 167 05/14/2020   HDL 60.50 05/14/2020   LDLCALC 94 05/14/2020   LDLDIRECT 128.5 04/30/2008   TRIG 60.0 05/14/2020   CHOLHDL 3 05/14/2020   Lab Results  Component Value Date   WBC 4.5 11/02/2019   HGB 11.4 (L) 11/02/2019   HCT 33.9 (L) 11/02/2019   MCV 86.5 11/02/2019   PLT 239.0 11/02/2019   Lab Results  Component Value Date   IRON 63 02/21/2019   TIBC 282 02/21/2019   FERRITIN 45 02/21/2019   Attestation Statements:   Reviewed by clinician on day of visit: allergies, medications, problem list, medical history, surgical history, family history, social history, and previous encounter notes.   Wilhemena Durie, am acting as Location manager for [employer], FNP-C.  I have reviewed the above documentation for accuracy and completeness, and I agree with the above. -  Georgianne Fick, FNP

## 2020-06-12 ENCOUNTER — Encounter (INDEPENDENT_AMBULATORY_CARE_PROVIDER_SITE_OTHER): Payer: Self-pay | Admitting: Family Medicine

## 2020-06-12 ENCOUNTER — Other Ambulatory Visit: Payer: Self-pay

## 2020-06-12 ENCOUNTER — Ambulatory Visit (INDEPENDENT_AMBULATORY_CARE_PROVIDER_SITE_OTHER): Payer: Managed Care, Other (non HMO) | Admitting: Family Medicine

## 2020-06-12 VITALS — BP 120/74 | HR 85 | Temp 98.1°F | Ht 68.0 in | Wt 303.0 lb

## 2020-06-12 DIAGNOSIS — Z6841 Body Mass Index (BMI) 40.0 and over, adult: Secondary | ICD-10-CM

## 2020-06-12 DIAGNOSIS — E559 Vitamin D deficiency, unspecified: Secondary | ICD-10-CM

## 2020-06-16 ENCOUNTER — Encounter (INDEPENDENT_AMBULATORY_CARE_PROVIDER_SITE_OTHER): Payer: Self-pay | Admitting: Family Medicine

## 2020-06-16 IMAGING — US US BREAST*L* LIMITED INC AXILLA
1 series · 13 of 19 positions shown · non-contrast
Comparison: May 23, 2018 and earlier priors

CLINICAL DATA: 57-year-old patient recalled recent screening
mammogram for evaluation 2 circumscribed left breast masses.

EXAM:
ULTRASOUND OF THE LEFT BREAST

[Series 1: us breast*left* limited inc axilla · 0.08mm/px · 13 of 19 slices shown]
[im 1/19]
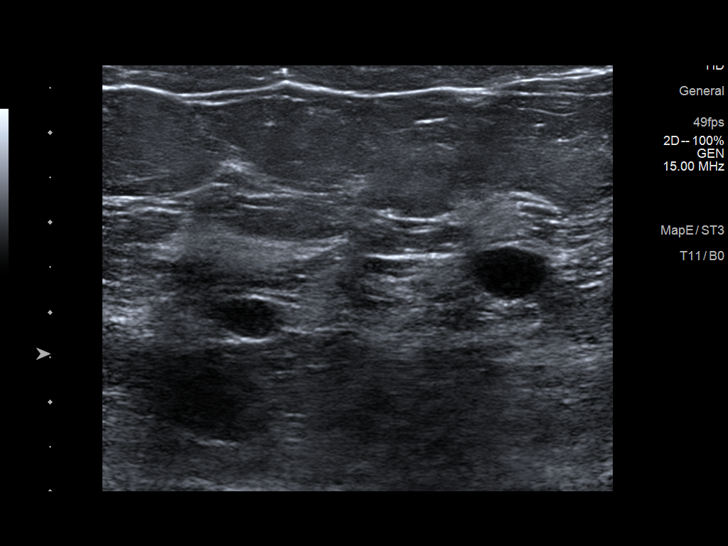
[im 3/19]
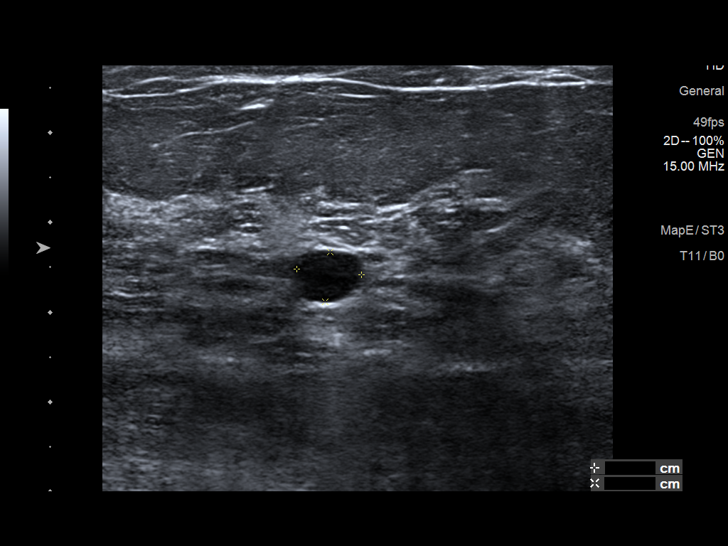
[im 4/19]
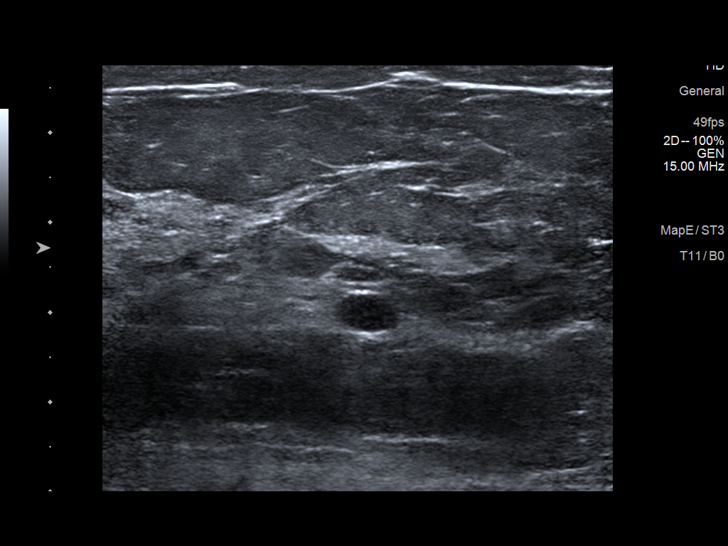
[im 6/19]
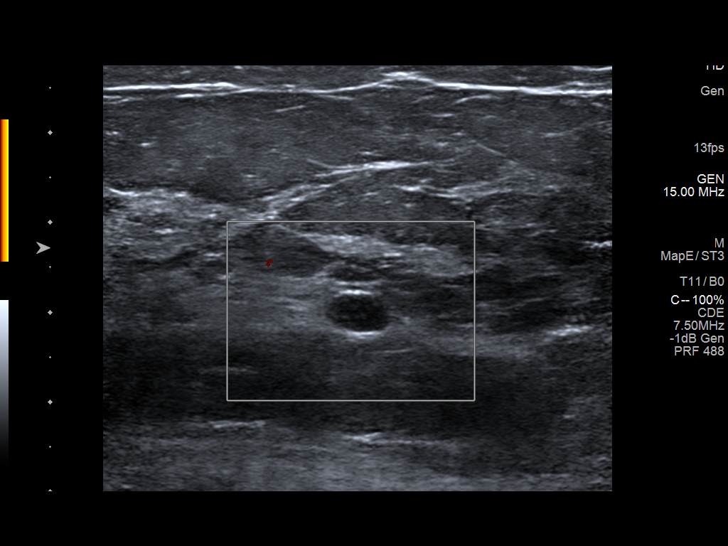
[im 7/19]
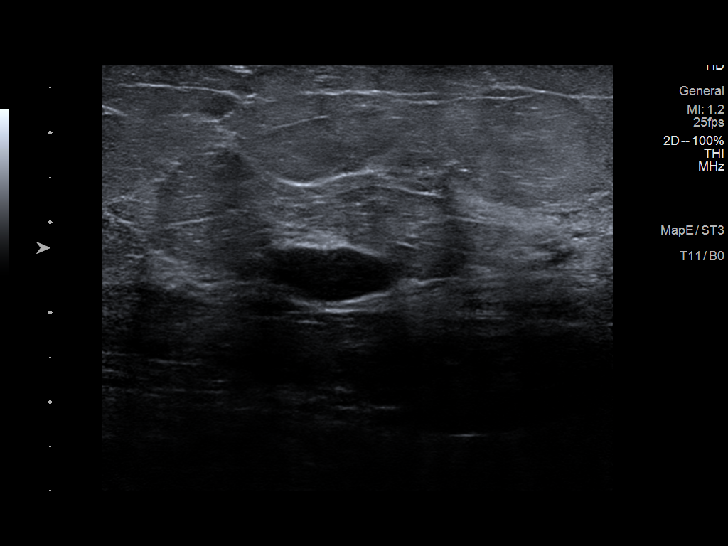
[im 9/19]
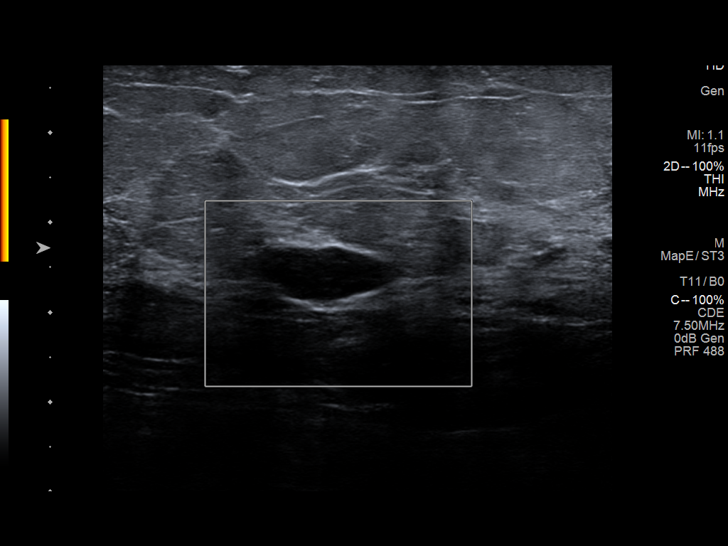
[im 10/19]
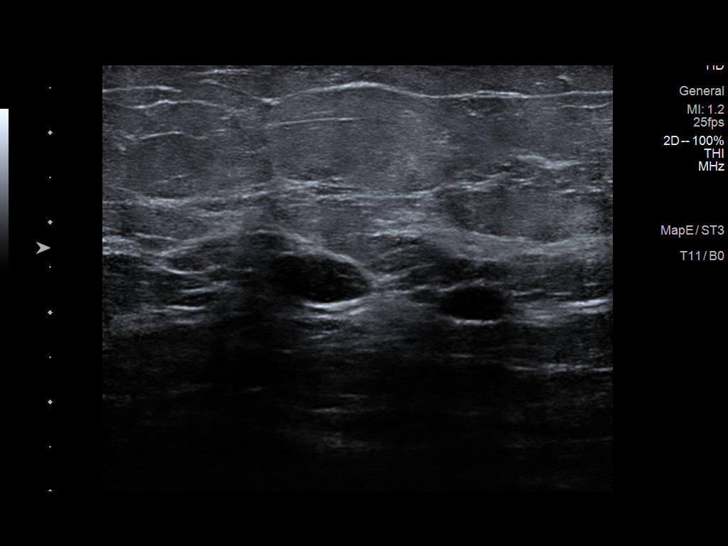
[im 11/19]
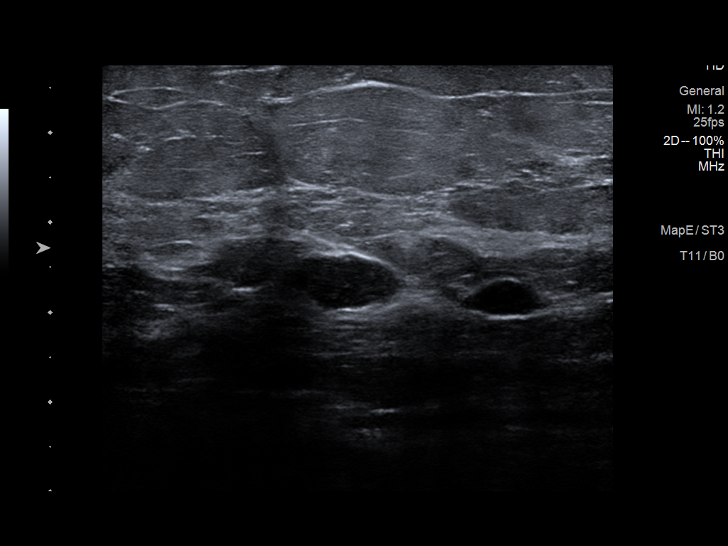
[im 13/19]
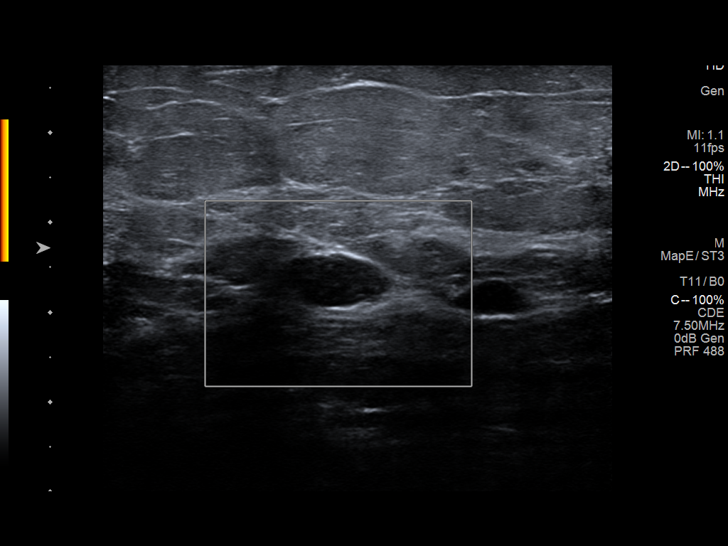
[im 14/19]
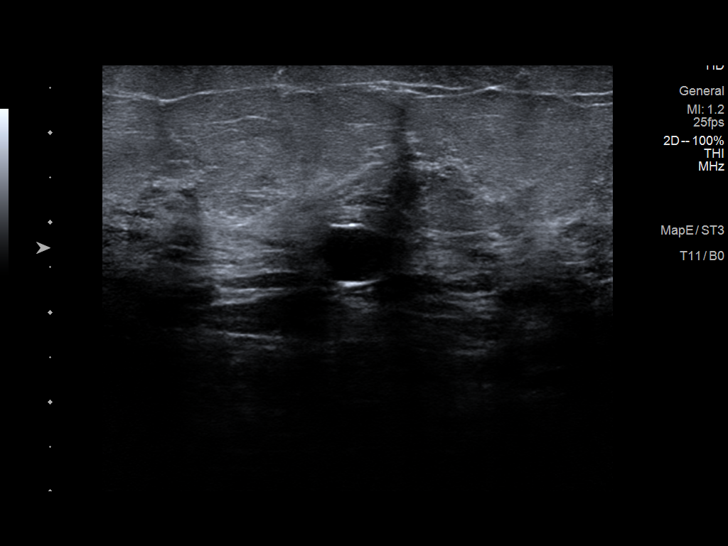
[im 16/19]
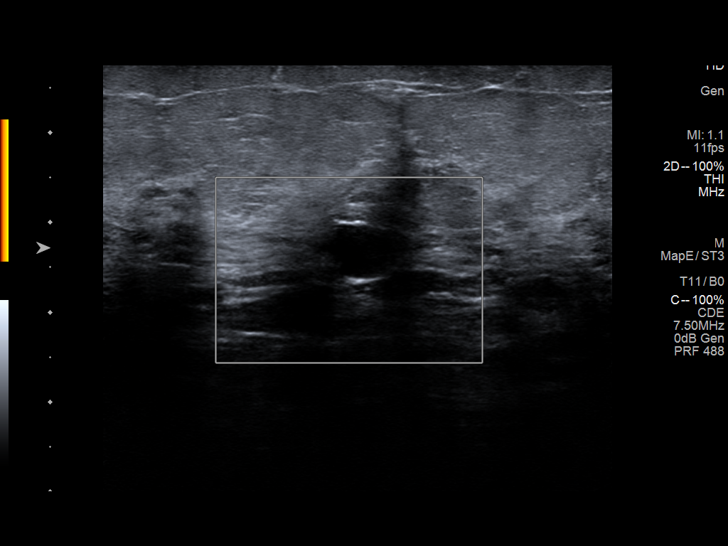
[im 17/19]
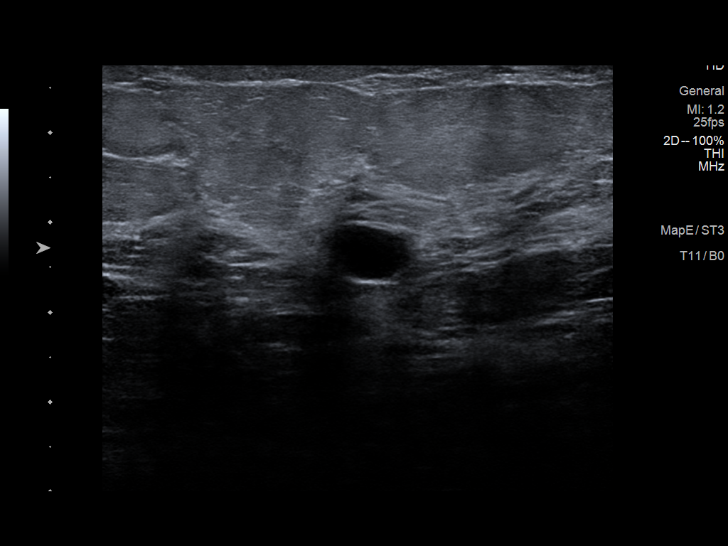
[im 19/19]
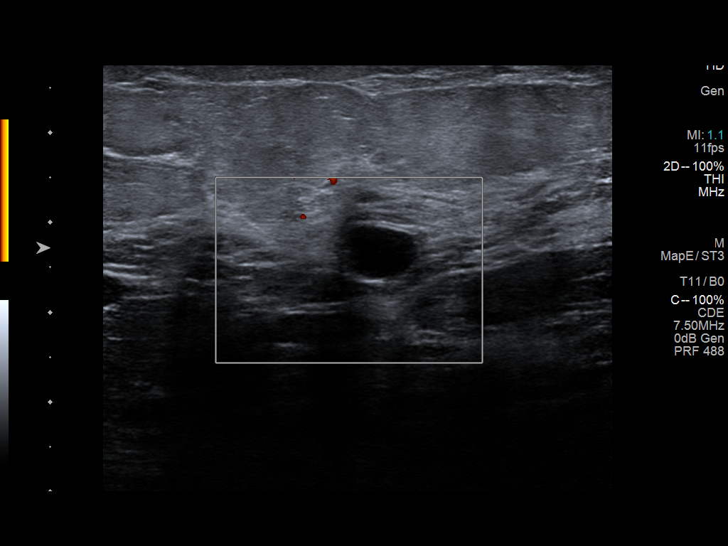

[13 of 19 positions shown; findings below may reference images not displayed]

FINDINGS: Targeted ultrasound is performed, showing a circumscribed oval mass
with well-defined posterior wall and posterior acoustic enhancement
at 12 o'clock position 6 cm from nipple measuring 1.6 x 0.6 x
cm. There are some internal echoes, and no internal vascular flow.
Findings consistent with a mildly complicated benign cyst.

Adjacent to this mass in the 12 o'clock position 4 cm from the
nipple is a 0.9 x 0.9 x 0.7 cm circumscribed oval mass consistent
with a simple cyst. There is no internal vascular flow.

These cysts correspond in size, shape, and location to the
mammographically detected masses. No suspicious mass identified on
ultrasound.
IMPRESSION: Benign cysts in the 12 o'clock axis of the left breast. No evidence
of malignancy.

RECOMMENDATION:
Screening mammogram in one year.(Code:O0-7-QXP)

I have discussed the findings and recommendations with the patient.
Results were also provided in writing at the conclusion of the
visit. If applicable, a reminder letter will be sent to the patient
regarding the next appointment.

BI-RADS CATEGORY  2: Benign.

## 2020-06-16 NOTE — Progress Notes (Signed)
Chief Complaint:   OBESITY [name] is here to discuss her progress with her obesity treatment plan along with follow-up of her obesity related diagnoses. [name] is on keeping a food journal and adhering to recommended goals of 1050-1150 calories and 75 grams of protein daily and states she is following her eating plan approximately 50% of the time. Smt. states she is walking for 30 minutes 7 times per week.  Today's visit was #: 23 Starting weight: 297 lbs Starting date: 02/21/2019 Today's weight: 303 lbs Today's date: 06/12/2020 Total lbs lost to date: 0 Total lbs lost since last in-office visit: 0  Interim History: [name] is still doing a fruit and vegetable challenge at work.  She has a 4-6 oz serving of meat at her work [employer]. She eats 2 meals per day at the cafeteria at work. She has grits and eggs at breakfast.  She is tracking calories and reports that she averages between 1100-1200 per day. I believe that eating from the cafeteria causes her to underestimate her calorie intake because her weight has been plateaued and/or increased for the past year despite reporting fairly good adherence to pan.  She drinks raspberry lemonade sometimes.  Subjective:   1. Vitamin D deficiency [name] is on OTC Vit D 2,000 IU daily. Last Vit D level was at goal at 49.  Assessment/Plan:   1. Vitamin D deficiency  Nema agreed to continue taking OTC Vitamin D 2,000 IU daily and will follow-up for routine testing of Vitamin D, at least 2-3 times per year to avoid over-replacement.  2. Class 3 severe obesity with serious comorbidity and body mass index (BMI) of 45.0 to 49.9 in adult, unspecified obesity type [facility]) [name] is currently in the action stage of change. As such, her goal is to continue with weight loss efforts. She has agreed to change to the Category 1 Plan.   We discussed taking food to work rather than buying food at work.  Exercise goals: As is.  Behavioral modification  strategies: increasing lean protein intake and decreasing simple carbohydrates.  [name] has agreed to follow-up with our clinic in 3 weeks.   Objective:   Blood pressure 120/74, pulse 85, temperature 98.1 F (36.7 C), height 5\' 8"  (1.727 m), weight (!) 303 lb (137.4 kg), SpO2 95 %. Body mass index is 46.07 kg/m.  General: Cooperative, alert, well developed, in no acute distress. HEENT: Conjunctivae and lids unremarkable. Cardiovascular: Regular rhythm.  Lungs: Normal work of breathing. Neurologic: No focal deficits.   Lab Results  Component Value Date   CREATININE 0.89 05/14/2020   BUN 22 05/14/2020   NA 139 05/14/2020   K 4.3 05/14/2020   CL 105 05/14/2020   CO2 27 05/14/2020   Lab Results  Component Value Date   ALT 19 05/14/2020   AST 18 05/14/2020   ALKPHOS 59 05/14/2020   BILITOT 0.7 05/14/2020   Lab Results  Component Value Date   HGBA1C 5.0 05/28/2020   HGBA1C 4.8 07/12/2019   HGBA1C 5.4 02/21/2019   Lab Results  Component Value Date   INSULIN 14.2 05/28/2020   INSULIN 8.2 07/12/2019   INSULIN 8.2 02/21/2019   Lab Results  Component Value Date   TSH 3.54 05/14/2020   Lab Results  Component Value Date   CHOL 167 05/14/2020   HDL 60.50 05/14/2020   LDLCALC 94 05/14/2020   LDLDIRECT 128.5 04/30/2008   TRIG 60.0 05/14/2020   CHOLHDL 3 05/14/2020   Lab Results  Component  Value Date   WBC 4.5 11/02/2019   HGB 11.4 (L) 11/02/2019   HCT 33.9 (L) 11/02/2019   MCV 86.5 11/02/2019   PLT 239.0 11/02/2019   Lab Results  Component Value Date   IRON 63 02/21/2019   TIBC 282 02/21/2019   FERRITIN 45 02/21/2019   Attestation Statements:   Reviewed by clinician on day of visit: allergies, medications, problem list, medical history, surgical history, family history, social history, and previous encounter notes.   Wilhemena Durie, am acting as Location manager for [employer], FNP-C.  I have reviewed the above documentation for accuracy and  completeness, and I agree with the above. -  Georgianne Fick, FNP

## 2020-06-30 ENCOUNTER — Encounter (INDEPENDENT_AMBULATORY_CARE_PROVIDER_SITE_OTHER): Payer: Self-pay | Admitting: Family Medicine

## 2020-06-30 ENCOUNTER — Ambulatory Visit (INDEPENDENT_AMBULATORY_CARE_PROVIDER_SITE_OTHER): Payer: Managed Care, Other (non HMO) | Admitting: Family Medicine

## 2020-06-30 ENCOUNTER — Other Ambulatory Visit: Payer: Self-pay

## 2020-06-30 VITALS — BP 120/75 | HR 80 | Temp 97.7°F | Ht 68.0 in | Wt 303.0 lb

## 2020-06-30 DIAGNOSIS — Z9989 Dependence on other enabling machines and devices: Secondary | ICD-10-CM | POA: Diagnosis not present

## 2020-06-30 DIAGNOSIS — G4733 Obstructive sleep apnea (adult) (pediatric): Secondary | ICD-10-CM | POA: Diagnosis not present

## 2020-06-30 DIAGNOSIS — Z6841 Body Mass Index (BMI) 40.0 and over, adult: Secondary | ICD-10-CM

## 2020-07-01 NOTE — Progress Notes (Signed)
Chief Complaint:   OBESITY [name] is here to discuss her progress with her obesity treatment plan along with follow-up of her obesity related diagnoses. [name] is on the Category 1 Plan and states she is following her eating plan approximately 100% of the time. [name] states she is walking for  30 minutes 7 times per week.  Today's visit was #: 24 Starting weight: 297 lbs Starting date: 02/21/2019 Today's weight: 303 lbs Today's date: 06/30/2020 Total lbs lost to date: 6 lbs Total lbs lost since last in-office visit: 0  Interim History: We switched [name] to cat 1 plan at last office to provide more structure to her meal plan. [name] still drinks some sugar-sweetened beverages (raspberry lemonade).  She is still eating 2 meals at work:  Breakfast (bacon, eggs, fruit and sometimes grits) and lunch (chicken on salad / largest meal).  Dinner is at home (chicken and vegetables).  Snack is a protein bar (unknown calories).  On weekends her eating schedule is somewhat off.  She snacks all day on Saturday and does not follow a plan. Sunday she eats wiht her family and skips breakfast.  Subjective:   1. OSA on CPAP Using CPAP for 6.5 hours.  However, she feels she needs to work on going to bed rather than falling asleep on the couch.  Assessment/Plan:   1. OSA on CPAP She will work on going to bed earlier.  Increase duration of CPAP use to full night.  2. Class 3 severe obesity with serious comorbidity and body mass index (BMI) of 45.0 to 49.9 in adult, unspecified obesity type [facility])  [name] is currently in the action stage of change. As such, her goal is to continue with weight loss efforts. She has agreed to the Category 1 Plan.   She will have breakfast and lunch on plan on Saturday. Cut out SSBs.  Exercise goals: As is.  Behavioral modification strategies: increasing lean protein intake, decreasing simple carbohydrates and meal planning and cooking strategies.  [name] has agreed  to follow-up with our clinic in 3 weeks.  Objective:   Blood pressure 120/75, pulse 80, temperature 97.7 F (36.5 C), height 5\' 8"  (1.727 m), weight (!) 303 lb (137.4 kg), SpO2 98 %. Body mass index is 46.07 kg/m.  General: Cooperative, alert, well developed, in no acute distress. HEENT: Conjunctivae and lids unremarkable. Cardiovascular: Regular rhythm.  Lungs: Normal work of breathing. Neurologic: No focal deficits.   Lab Results  Component Value Date   CREATININE 0.89 05/14/2020   BUN 22 05/14/2020   NA 139 05/14/2020   K 4.3 05/14/2020   CL 105 05/14/2020   CO2 27 05/14/2020   Lab Results  Component Value Date   ALT 19 05/14/2020   AST 18 05/14/2020   ALKPHOS 59 05/14/2020   BILITOT 0.7 05/14/2020   Lab Results  Component Value Date   HGBA1C 5.0 05/28/2020   HGBA1C 4.8 07/12/2019   HGBA1C 5.4 02/21/2019   Lab Results  Component Value Date   INSULIN 14.2 05/28/2020   INSULIN 8.2 07/12/2019   INSULIN 8.2 02/21/2019   Lab Results  Component Value Date   TSH 3.54 05/14/2020   Lab Results  Component Value Date   CHOL 167 05/14/2020   HDL 60.50 05/14/2020   LDLCALC 94 05/14/2020   LDLDIRECT 128.5 04/30/2008   TRIG 60.0 05/14/2020   CHOLHDL 3 05/14/2020   Lab Results  Component Value Date   WBC 4.5 11/02/2019   HGB 11.4 (L)  11/02/2019   HCT 33.9 (L) 11/02/2019   MCV 86.5 11/02/2019   PLT 239.0 11/02/2019   Lab Results  Component Value Date   IRON 63 02/21/2019   TIBC 282 02/21/2019   FERRITIN 45 02/21/2019   Attestation Statements:   Reviewed by clinician on day of visit: allergies, medications, problem list, medical history, surgical history, family history, social history, and previous encounter notes.  I, Water quality scientist, CMA, am acting as Location manager for [employer], [place].  I have reviewed the above documentation for accuracy and completeness, and I agree with the above. -  Georgianne Fick, FNP

## 2020-07-02 ENCOUNTER — Encounter (INDEPENDENT_AMBULATORY_CARE_PROVIDER_SITE_OTHER): Payer: Self-pay | Admitting: Family Medicine

## 2020-07-23 ENCOUNTER — Ambulatory Visit (INDEPENDENT_AMBULATORY_CARE_PROVIDER_SITE_OTHER): Payer: Managed Care, Other (non HMO) | Admitting: Adult Health

## 2020-08-26 ENCOUNTER — Other Ambulatory Visit: Payer: Self-pay | Admitting: Family Medicine

## 2020-08-26 DIAGNOSIS — I10 Essential (primary) hypertension: Secondary | ICD-10-CM

## 2020-09-06 ENCOUNTER — Other Ambulatory Visit: Payer: Self-pay | Admitting: Family Medicine

## 2020-09-06 DIAGNOSIS — R1013 Epigastric pain: Secondary | ICD-10-CM

## 2020-09-06 DIAGNOSIS — E039 Hypothyroidism, unspecified: Secondary | ICD-10-CM

## 2020-11-04 ENCOUNTER — Encounter: Payer: Self-pay | Admitting: Family Medicine

## 2020-11-04 ENCOUNTER — Other Ambulatory Visit: Payer: Self-pay

## 2020-11-04 ENCOUNTER — Ambulatory Visit (INDEPENDENT_AMBULATORY_CARE_PROVIDER_SITE_OTHER): Payer: Managed Care, Other (non HMO) | Admitting: Family Medicine

## 2020-11-04 VITALS — BP 130/70 | HR 73 | Temp 98.7°F | Resp 18 | Ht 68.0 in | Wt 304.2 lb

## 2020-11-04 DIAGNOSIS — E876 Hypokalemia: Secondary | ICD-10-CM | POA: Diagnosis not present

## 2020-11-04 DIAGNOSIS — E039 Hypothyroidism, unspecified: Secondary | ICD-10-CM

## 2020-11-04 DIAGNOSIS — B354 Tinea corporis: Secondary | ICD-10-CM

## 2020-11-04 DIAGNOSIS — I10 Essential (primary) hypertension: Secondary | ICD-10-CM

## 2020-11-04 DIAGNOSIS — E785 Hyperlipidemia, unspecified: Secondary | ICD-10-CM | POA: Diagnosis not present

## 2020-11-04 MED ORDER — POTASSIUM CHLORIDE CRYS ER 10 MEQ PO TBCR: 20.0000 meq | EXTENDED_RELEASE_TABLET | Freq: Two times a day (BID) | ORAL | 5 refills | Status: DC

## 2020-11-04 MED ORDER — NAFTIFINE HCL 1 % EX CREA: TOPICAL_CREAM | Freq: Every day | CUTANEOUS | 2 refills | Status: DC

## 2020-11-04 NOTE — Assessment & Plan Note (Signed)
Well controlled, no changes to meds. Encouraged heart healthy diet such as the DASH diet and exercise as tolerated.  °

## 2020-11-04 NOTE — Progress Notes (Signed)
Subjective:   By signing my name below, I, [name], attest that this documentation has been prepared under the direction and in the presence of Dr. Roma Schanz, DO. 11/04/2020    Patient ID: [name], female    DOB: 01-05-1961, 60 y.o.   MRN: [id number]  Chief Complaint  Patient presents with  . Hypertension  . Hyperlipidemia  . Hypothyroidism  . Follow-up    HPI Patient is in today for a office visit.  She reports doing well during this visit.  She is requesting a refill for 1% naftine cream and 20 meq Klor-Con 2x daily PO.  She continues seeing her GYN specialist, Dr. Nori Riis.  She reports having any leg swelling in her right lower leg due to an injury. She is planning to see her sports medicine specialist to manage her pain and swelling. She stopped seeing her previous healthy weight and wellness program because started using her jobs own weight loss program. She has 3 pfizer Covid-19 vaccines and is interested in getting the 2nd booster vaccine after this visit.  She is getting lab work after this visit. She reports eating breakfast at 9:30 am today.   Past Medical History:  Diagnosis Date  . Abdominal pain, other specified site   . Acute upper respiratory infections of unspecified site   . Allergy    seasonal  . Anemia   . Benign neoplasm of skin, site unspecified   . Disturbance of skin sensation   . Diverticulosis of colon (without mention of hemorrhage)   . Family history of diabetes mellitus   . Family history of ischemic heart disease   . Family history of malignant neoplasm of genital organ, other   . GERD (gastroesophageal reflux disease)   . Hypertension   . Hypokalemia   . Left shoulder pain   . Low blood potassium   . Post-menopausal   . Rheumatoid arthritis ([place])   . Sleep apnea    wears cpap   . Sprain and strain of unspecified site of knee and leg   . Sprain of neck   . Thyroid disease   . Unspecified essential hypertension      Past Surgical History:  Procedure Laterality Date  . ABDOMINAL HYSTERECTOMY    . BUNIONECTOMY  2005   right foot  . COLONOSCOPY  6-12/2008  . SHOULDER SURGERY     left;spurs 2-09- dr Rhoderick Moody  . TOTAL ABDOMINAL HYSTERECTOMY W/ BILATERAL SALPINGOOPHORECTOMY  2002    Family History  Problem Relation Age of Onset  . Diabetes Mother   . Hypertension Mother   . Dementia Mother   . Stroke Mother   . Stroke Maternal Aunt   . Breast cancer Maternal Aunt   . Prostate cancer Father   . Hypertension Father   . Cancer Father   . Diabetes Brother   . Hypertension Sister   . Breast cancer Cousin   . Colon cancer Neg Hx   . Colon polyps Neg Hx   . Esophageal cancer Neg Hx   . Rectal cancer Neg Hx   . Stomach cancer Neg Hx     Social History   Socioeconomic History  . Marital status: Married    Spouse name: Not on file  . Number of children: 0  . Years of education: Not on file  . Highest education level: Not on file  Occupational History  . Occupation: Retail buyer: [employer]  Tobacco Use  . Smoking  status: Never  . Smokeless tobacco: Never  Substance and Sexual Activity  . Alcohol use: No  . Drug use: No  . Sexual activity: Yes    Partners: Male  Other Topics Concern  . Not on file  Social History Narrative   Regular exercise- joined [employer]--- plans to go 5 days a week   Social Determinants of Health   Financial Resource Strain: Not on file  Food Insecurity: Not on file  Transportation Needs: Not on file  Physical Activity: Not on file  Stress: Not on file  Social Connections: Not on file  Intimate Partner Violence: Not on file    Outpatient Medications Prior to Visit  Medication Sig Dispense Refill  . amLODipine (NORVASC) 10 MG tablet TAKE ONE TABLET BY MOUTH DAILY 90 tablet 0  . Cholecalciferol (VITAMIN D) 50 MCG (2000 UT) CAPS Take 1 capsule (2,000 Units total) by mouth daily. 30 capsule 0  . diclofenac (VOLTAREN) 75 MG EC tablet Take  1 tablet (75 mg total) by mouth 2 (two) times daily. 50 tablet 2  . estradiol (ESTRACE) 2 MG tablet Take 1 mg by mouth as needed.    . fluticasone (FLONASE) 50 MCG/ACT nasal spray INSTILL TWO SPRAYS INTO BOTH NOSTRIL DAILY. 16 g 2  . furosemide (LASIX) 40 MG tablet 1 every 3 days if needed. 30 tablet 0  . hydrochlorothiazide (HYDRODIURIL) 25 MG tablet TAKE ONE TABLET BY MOUTH DAILY 90 tablet 0  . hydroxychloroquine (PLAQUENIL) 200 MG tablet Take 200 mg by mouth 2 (two) times daily.    Marland Kitchen levothyroxine (SYNTHROID) 50 MCG tablet TAKE ONE TABLET BY MOUTH DAILY WITH BREAKFAST 30 tablet 1  . losartan (COZAAR) 100 MG tablet Take 1 tablet (100 mg total) by mouth daily. 90 tablet 1  . naproxen sodium (ALEVE) 220 MG tablet Take 220 mg by mouth.    Marland Kitchen omeprazole (PRILOSEC) 20 MG capsule TAKE ONE CAPSULE BY MOUTH DAILY 30 capsule 1  . RESTASIS 0.05 % ophthalmic emulsion Place 1 drop into both eyes daily.    . clotrimazole-betamethasone (LOTRISONE) cream Apply 1 application topically 2 (two) times daily. 30 g 0  . naftifine (NAFTIN) 1 % cream Apply topically daily. 60 g 2  . potassium chloride (KLOR-CON) 10 MEQ tablet Take 2 tablets (20 mEq total) by mouth 2 (two) times daily. 120 tablet 5   No facility-administered medications prior to visit.    No Known Allergies  Review of Systems  Constitutional:  Negative for fever and malaise/fatigue.  HENT:  Negative for congestion.   Eyes:  Negative for blurred vision.  Respiratory:  Negative for cough and shortness of breath.   Cardiovascular:  Negative for chest pain, palpitations and leg swelling.  Gastrointestinal:  Negative for abdominal pain, blood in stool, nausea and vomiting.  Genitourinary:  Negative for dysuria and frequency.  Musculoskeletal:  Negative for back pain and falls.  Skin:  Negative for rash.  Neurological:  Negative for dizziness, loss of consciousness and headaches.  Endo/Heme/Allergies:  Negative for environmental allergies.   Psychiatric/Behavioral:  Negative for depression. The patient is not nervous/anxious.       Objective:    Physical Exam Constitutional:      General: She is not in acute distress.    Appearance: Normal appearance. She is not ill-appearing.  HENT:     Head: Normocephalic and atraumatic.     Right Ear: External ear normal.     Left Ear: External ear normal.  Eyes:  Extraocular Movements: Extraocular movements intact.     Pupils: Pupils are equal, round, and reactive to light.  Cardiovascular:     Rate and Rhythm: Normal rate and regular rhythm.     Pulses: Normal pulses.     Heart sounds: Normal heart sounds. No murmur heard.   No gallop.  Pulmonary:     Effort: Pulmonary effort is normal. No respiratory distress.     Breath sounds: Normal breath sounds. No wheezing, rhonchi or rales.  Skin:    General: Skin is warm and dry.  Neurological:     Mental Status: She is alert and oriented to person, place, and time.  Psychiatric:        Behavior: Behavior normal.    BP 130/70 (BP Location: Right Arm, Patient Position: Sitting, Cuff Size: Large)   Pulse 73   Temp 98.7 F (37.1 C) (Oral)   Resp 18   Ht 5\' 8"  (1.727 m)   Wt (!) 304 lb 3.2 oz (138 kg)   SpO2 97%   BMI 46.25 kg/m  Wt Readings from Last 3 Encounters:  11/04/20 (!) 304 lb 3.2 oz (138 kg)  06/30/20 (!) 303 lb (137.4 kg)  06/12/20 (!) 303 lb (137.4 kg)    Diabetic Foot Exam - Simple   No data filed    Lab Results  Component Value Date   WBC 4.5 11/02/2019   HGB 11.4 (L) 11/02/2019   HCT 33.9 (L) 11/02/2019   PLT 239.0 11/02/2019   GLUCOSE 79 05/14/2020   CHOL 167 05/14/2020   TRIG 60.0 05/14/2020   HDL 60.50 05/14/2020   LDLDIRECT 128.5 04/30/2008   LDLCALC 94 05/14/2020   ALT 19 05/14/2020   AST 18 05/14/2020   NA 139 05/14/2020   K 4.3 05/14/2020   CL 105 05/14/2020   CREATININE 0.89 05/14/2020   BUN 22 05/14/2020   CO2 27 05/14/2020   TSH 3.54 05/14/2020   HGBA1C 5.0 05/28/2020     Lab Results  Component Value Date   TSH 3.54 05/14/2020   Lab Results  Component Value Date   WBC 4.5 11/02/2019   HGB 11.4 (L) 11/02/2019   HCT 33.9 (L) 11/02/2019   MCV 86.5 11/02/2019   PLT 239.0 11/02/2019   Lab Results  Component Value Date   NA 139 05/14/2020   K 4.3 05/14/2020   CO2 27 05/14/2020   GLUCOSE 79 05/14/2020   BUN 22 05/14/2020   CREATININE 0.89 05/14/2020   BILITOT 0.7 05/14/2020   ALKPHOS 59 05/14/2020   AST 18 05/14/2020   ALT 19 05/14/2020   PROT 6.9 05/14/2020   ALBUMIN 4.0 05/14/2020   CALCIUM 9.3 05/14/2020   GFR 71.00 05/14/2020   Lab Results  Component Value Date   CHOL 167 05/14/2020   Lab Results  Component Value Date   HDL 60.50 05/14/2020   Lab Results  Component Value Date   LDLCALC 94 05/14/2020   Lab Results  Component Value Date   TRIG 60.0 05/14/2020   Lab Results  Component Value Date   CHOLHDL 3 05/14/2020   Lab Results  Component Value Date   HGBA1C 5.0 05/28/2020       Assessment & Plan:   Problem List Items Addressed This Visit       Unprioritized   Essential hypertension    Well controlled, no changes to meds. Encouraged heart healthy diet such as the DASH diet and exercise as tolerated.        Hyperlipidemia  Encourage heart healthy diet such as MIND or DASH diet, increase exercise, avoid trans fats, simple carbohydrates and processed foods, consider a krill or fish or flaxseed oil cap daily.        Relevant Orders   Lipid panel   TSH   Comprehensive metabolic panel   Hypothyroidism    Check labs today       Relevant Orders   TSH   Other Visit Diagnoses     Primary hypertension    -  Primary   Relevant Orders   Lipid panel   TSH   Comprehensive metabolic panel   Tinea corporis       Relevant Medications   naftifine (NAFTIN) 1 % cream   Hypokalemia       Relevant Medications   potassium chloride (KLOR-CON) 10 MEQ tablet   Other Relevant Orders   Comprehensive metabolic  panel        Meds ordered this encounter  Medications  . naftifine (NAFTIN) 1 % cream    Sig: Apply topically daily.    Dispense:  60 g    Refill:  2  . potassium chloride (KLOR-CON) 10 MEQ tablet    Sig: Take 2 tablets (20 mEq total) by mouth 2 (two) times daily.    Dispense:  120 tablet    Refill:  5    I, Dr. Roma Schanz, DO, personally preformed the services described in this documentation.  All medical record entries made by the scribe were at my direction and in my presence.  I have reviewed the chart and discharge instructions (if applicable) and agree that the record reflects my personal performance and is accurate and complete. 11/04/2020   I,[name],acting as a Education administrator for [employer], DO.,have documented all relevant documentation on the behalf of [name], DO,as directed by  [name], DO while in the presence of [name], DO.   [name], DO

## 2020-11-04 NOTE — Assessment & Plan Note (Signed)
Encourage heart healthy diet such as MIND or DASH diet, increase exercise, avoid trans fats, simple carbohydrates and processed foods, consider a krill or fish or flaxseed oil cap daily.  °

## 2020-11-04 NOTE — Assessment & Plan Note (Signed)
Check labs today.

## 2020-11-04 NOTE — Patient Instructions (Signed)
https://www.nhlbi.nih.gov/files/docs/public/heart/dash_brief.pdf">  DASH Eating Plan DASH stands for Dietary Approaches to Stop Hypertension. The DASH eating plan is a healthy eating plan that has been shown to: Reduce high blood pressure (hypertension). Reduce your risk for type 2 diabetes, heart disease, and stroke. Help with weight loss. What are tips for following this plan? Reading food labels Check food labels for the amount of salt (sodium) per serving. Choose foods with less than 5 percent of the Daily Value of sodium. Generally, foods with less than 300 milligrams (mg) of sodium per serving fit into this eating plan. To find whole grains, look for the word "whole" as the first word in the ingredient list. Shopping Buy products labeled as "low-sodium" or "no salt added." Buy fresh foods. Avoid canned foods and pre-made or frozen meals. Cooking Avoid adding salt when cooking. Use salt-free seasonings or herbs instead of table salt or sea salt. Check with your health care provider or pharmacist before using salt substitutes. Do not fry foods. Cook foods using healthy methods such as baking, boiling, grilling, roasting, and broiling instead. Cook with heart-healthy oils, such as olive, canola, avocado, soybean, or sunflower oil. Meal planning  Eat a balanced diet that includes: 4 or more servings of fruits and 4 or more servings of vegetables each day. Try to fill one-half of your plate with fruits and vegetables. 6-8 servings of whole grains each day. Less than 6 oz (170 g) of lean meat, poultry, or fish each day. A 3-oz (85-g) serving of meat is about the same size as a deck of cards. One egg equals 1 oz (28 g). 2-3 servings of low-fat dairy each day. One serving is 1 cup (237 mL). 1 serving of nuts, seeds, or beans 5 times each week. 2-3 servings of heart-healthy fats. Healthy fats called omega-3 fatty acids are found in foods such as walnuts, flaxseeds, fortified milks, and eggs.  These fats are also found in cold-water fish, such as sardines, salmon, and mackerel. Limit how much you eat of: Canned or prepackaged foods. Food that is high in trans fat, such as some fried foods. Food that is high in saturated fat, such as fatty meat. Desserts and other sweets, sugary drinks, and other foods with added sugar. Full-fat dairy products. Do not salt foods before eating. Do not eat more than 4 egg yolks a week. Try to eat at least 2 vegetarian meals a week. Eat more home-cooked food and less restaurant, buffet, and fast food.  Lifestyle When eating at a restaurant, ask that your food be prepared with less salt or no salt, if possible. If you drink alcohol: Limit how much you use to: 0-1 drink a day for women who are not pregnant. 0-2 drinks a day for men. Be aware of how much alcohol is in your drink. In the [place] one drink equals one 12 oz bottle of beer (355 mL), one 5 oz glass of wine (148 mL), or one 1 oz glass of hard liquor (44 mL). General information Avoid eating more than 2,300 mg of salt a day. If you have hypertension, you may need to reduce your sodium intake to 1,500 mg a day. Work with your health care provider to maintain a healthy body weight or to lose weight. Ask what an ideal weight is for you. Get at least 30 minutes of exercise that causes your heart to beat faster (aerobic exercise) most days of the week. Activities may include walking, swimming, or biking. Work with your health care provider   or dietitian to adjust your eating plan to your individual calorie needs. What foods should I eat? Fruits All fresh, dried, or frozen fruit. Canned fruit in natural juice (without addedsugar). Vegetables Fresh or frozen vegetables (raw, steamed, roasted, or grilled). Low-sodium or reduced-sodium tomato and vegetable juice. Low-sodium or reduced-sodium tomatosauce and tomato paste. Low-sodium or reduced-sodium canned vegetables. Grains Whole-grain or  whole-wheat bread. Whole-grain or whole-wheat pasta. Brown rice. Oatmeal. Quinoa. Bulgur. Whole-grain and low-sodium cereals. Pita bread.Low-fat, low-sodium crackers. Whole-wheat flour tortillas. Meats and other proteins Skinless chicken or turkey. Ground chicken or turkey. Pork with fat trimmed off. Fish and seafood. Egg whites. Dried beans, peas, or lentils. Unsalted nuts, nut butters, and seeds. Unsalted canned beans. Lean cuts of beef with fat trimmed off. Low-sodium, lean precooked or cured meat, such as sausages or meatloaves. Dairy Low-fat (1%) or fat-free (skim) milk. Reduced-fat, low-fat, or fat-free cheeses. Nonfat, low-sodium ricotta or cottage cheese. Low-fat or nonfatyogurt. Low-fat, low-sodium cheese. Fats and oils Soft margarine without trans fats. Vegetable oil. Reduced-fat, low-fat, or light mayonnaise and salad dressings (reduced-sodium). Canola, safflower, olive, avocado, soybean, andsunflower oils. Avocado. Seasonings and condiments Herbs. Spices. Seasoning mixes without salt. Other foods Unsalted popcorn and pretzels. Fat-free sweets. The items listed above may not be a complete list of foods and beverages you can eat. Contact a dietitian for more information. What foods should I avoid? Fruits Canned fruit in a light or heavy syrup. Fried fruit. Fruit in cream or buttersauce. Vegetables Creamed or fried vegetables. Vegetables in a cheese sauce. Regular canned vegetables (not low-sodium or reduced-sodium). Regular canned tomato sauce and paste (not low-sodium or reduced-sodium). Regular tomato and vegetable juice(not low-sodium or reduced-sodium). Pickles. Olives. Grains Baked goods made with fat, such as croissants, muffins, or some breads. Drypasta or rice meal packs. Meats and other proteins Fatty cuts of meat. Ribs. Fried meat. Bacon. Bologna, salami, and other precooked or cured meats, such as sausages or meat loaves. Fat from the back of a pig (fatback). Bratwurst.  Salted nuts and seeds. Canned beans with added salt. Canned orsmoked fish. Whole eggs or egg yolks. Chicken or turkey with skin. Dairy Whole or 2% milk, cream, and half-and-half. Whole or full-fat cream cheese. Whole-fat or sweetened yogurt. Full-fat cheese. Nondairy creamers. Whippedtoppings. Processed cheese and cheese spreads. Fats and oils Butter. Stick margarine. Lard. Shortening. Ghee. Bacon fat. Tropical oils, suchas coconut, palm kernel, or palm oil. Seasonings and condiments Onion salt, garlic salt, seasoned salt, table salt, and sea salt. Worcestershire sauce. Tartar sauce. Barbecue sauce. Teriyaki sauce. Soy sauce, including reduced-sodium. Steak sauce. Canned and packaged gravies. Fish sauce. Oyster sauce. Cocktail sauce. Store-bought horseradish. Ketchup. Mustard. Meat flavorings and tenderizers. Bouillon cubes. Hot sauces. Pre-made or packaged marinades. Pre-made or packaged taco seasonings. Relishes. Regular saladdressings. Other foods Salted popcorn and pretzels. The items listed above may not be a complete list of foods and beverages you should avoid. Contact a dietitian for more information. Where to find more information National Heart, Lung, and Blood Institute: www.nhlbi.nih.gov American Heart Association: www.heart.org Academy of Nutrition and Dietetics: www.eatright.org National Kidney Foundation: www.kidney.org Summary The DASH eating plan is a healthy eating plan that has been shown to reduce high blood pressure (hypertension). It may also reduce your risk for type 2 diabetes, heart disease, and stroke. When on the DASH eating plan, aim to eat more fresh fruits and vegetables, whole grains, lean proteins, low-fat dairy, and heart-healthy fats. With the DASH eating plan, you should limit salt (sodium) intake to 2,300   mg a day. If you have hypertension, you may need to reduce your sodium intake to 1,500 mg a day. Work with your health care provider or dietitian to adjust  your eating plan to your individual calorie needs. This information is not intended to replace advice given to you by your health care provider. Make sure you discuss any questions you have with your healthcare provider. Document Revised: 03/09/2019 Document Reviewed: 03/09/2019 Elsevier Patient Education  2022 Elsevier Inc.  

## 2020-11-05 LAB — COMPREHENSIVE METABOLIC PANEL
ALT: 10 U/L (ref 0–35)
AST: 12 U/L (ref 0–37)
Albumin: 3.8 g/dL (ref 3.5–5.2)
Alkaline Phosphatase: 59 U/L (ref 39–117)
BUN: 18 mg/dL (ref 6–23)
CO2: 29 mEq/L (ref 19–32)
Calcium: 8.6 mg/dL (ref 8.4–10.5)
Chloride: 105 mEq/L (ref 96–112)
Creatinine, Ser: 0.99 mg/dL (ref 0.40–1.20)
GFR: 62.27 mL/min (ref >60.00)
Glucose, Bld: 78 mg/dL (ref 70–99)
Potassium: 4.1 mEq/L (ref 3.5–5.1)
Sodium: 141 mEq/L (ref 135–145)
Total Bilirubin: 0.7 mg/dL (ref 0.2–1.2)
Total Protein: 6.8 g/dL (ref 6.0–8.3)

## 2020-11-05 LAB — LIPID PANEL
Cholesterol: 180 mg/dL (ref 0–200)
HDL: 62.2 mg/dL (ref >39.00)
LDL Cholesterol: 103 mg/dL — ABNORMAL HIGH (ref 0–99)
NonHDL: 117.46
Total CHOL/HDL Ratio: 3
Triglycerides: 74 mg/dL (ref 0.0–149.0)
VLDL: 14.8 mg/dL (ref 0.0–40.0)

## 2020-11-05 LAB — TSH: TSH: 2.88 u[IU]/mL (ref 0.35–5.50)

## 2020-11-28 ENCOUNTER — Other Ambulatory Visit: Payer: Self-pay | Admitting: Family Medicine

## 2020-11-28 DIAGNOSIS — I10 Essential (primary) hypertension: Secondary | ICD-10-CM

## 2021-01-07 ENCOUNTER — Ambulatory Visit: Payer: Managed Care, Other (non HMO) | Admitting: Adult Health

## 2021-02-26 ENCOUNTER — Other Ambulatory Visit: Payer: Self-pay | Admitting: Family Medicine

## 2021-02-26 DIAGNOSIS — R1013 Epigastric pain: Secondary | ICD-10-CM

## 2021-02-26 DIAGNOSIS — J302 Other seasonal allergic rhinitis: Secondary | ICD-10-CM

## 2021-02-26 DIAGNOSIS — I10 Essential (primary) hypertension: Secondary | ICD-10-CM

## 2021-02-26 DIAGNOSIS — B354 Tinea corporis: Secondary | ICD-10-CM

## 2021-02-26 MED ORDER — NAFTIFINE HCL 1 % EX CREA: TOPICAL_CREAM | Freq: Every day | CUTANEOUS | 2 refills | Status: DC

## 2021-02-26 MED ORDER — OMEPRAZOLE 20 MG PO CPDR: 20.0000 mg | DELAYED_RELEASE_CAPSULE | Freq: Every day | ORAL | 0 refills | Status: DC

## 2021-02-26 MED ORDER — FLUTICASONE PROPIONATE 50 MCG/ACT NA SUSP: NASAL | 2 refills | Status: DC

## 2021-02-26 NOTE — Telephone Encounter (Signed)
HCTZ refill sent to pharmacy. Notified pt she is due for cpe in January per last OV.  Appt scheduled for 05/15/21 at 9:20am. Pt also requested refills of flonase, omeprazole and naftin be sent today as well. Refills sent.

## 2021-05-15 ENCOUNTER — Encounter: Payer: Managed Care, Other (non HMO) | Admitting: Family Medicine

## 2021-05-27 ENCOUNTER — Other Ambulatory Visit: Payer: Self-pay

## 2021-05-27 DIAGNOSIS — I10 Essential (primary) hypertension: Secondary | ICD-10-CM

## 2021-05-27 MED ORDER — HYDROCHLOROTHIAZIDE 25 MG PO TABS: 25.0000 mg | ORAL_TABLET | Freq: Every day | ORAL | 0 refills | Status: DC

## 2021-06-09 ENCOUNTER — Encounter: Payer: Self-pay | Admitting: Family Medicine

## 2021-06-09 ENCOUNTER — Ambulatory Visit: Payer: 59 | Attending: Internal Medicine

## 2021-06-09 ENCOUNTER — Ambulatory Visit (INDEPENDENT_AMBULATORY_CARE_PROVIDER_SITE_OTHER): Payer: 59 | Admitting: Family Medicine

## 2021-06-09 VITALS — BP 130/80 | HR 74 | Temp 97.8°F | Resp 18 | Ht 68.0 in | Wt 308.4 lb

## 2021-06-09 DIAGNOSIS — E876 Hypokalemia: Secondary | ICD-10-CM | POA: Diagnosis not present

## 2021-06-09 DIAGNOSIS — Z0000 Encounter for general adult medical examination without abnormal findings: Secondary | ICD-10-CM | POA: Diagnosis not present

## 2021-06-09 DIAGNOSIS — M329 Systemic lupus erythematosus, unspecified: Secondary | ICD-10-CM

## 2021-06-09 DIAGNOSIS — Z23 Encounter for immunization: Secondary | ICD-10-CM | POA: Diagnosis not present

## 2021-06-09 DIAGNOSIS — Z6841 Body Mass Index (BMI) 40.0 and over, adult: Secondary | ICD-10-CM

## 2021-06-09 DIAGNOSIS — E559 Vitamin D deficiency, unspecified: Secondary | ICD-10-CM | POA: Diagnosis not present

## 2021-06-09 DIAGNOSIS — I10 Essential (primary) hypertension: Secondary | ICD-10-CM | POA: Diagnosis not present

## 2021-06-09 DIAGNOSIS — E039 Hypothyroidism, unspecified: Secondary | ICD-10-CM

## 2021-06-09 DIAGNOSIS — E785 Hyperlipidemia, unspecified: Secondary | ICD-10-CM

## 2021-06-09 LAB — LIPID PANEL
Cholesterol: 177 mg/dL (ref 0–200)
HDL: 59.9 mg/dL (ref >39.00)
LDL Cholesterol: 104 mg/dL — ABNORMAL HIGH (ref 0–99)
NonHDL: 117.53
Total CHOL/HDL Ratio: 3
Triglycerides: 70 mg/dL (ref 0.0–149.0)
VLDL: 14 mg/dL (ref 0.0–40.0)

## 2021-06-09 LAB — COMPREHENSIVE METABOLIC PANEL
ALT: 11 U/L (ref 0–35)
AST: 12 U/L (ref 0–37)
Albumin: 3.8 g/dL (ref 3.5–5.2)
Alkaline Phosphatase: 64 U/L (ref 39–117)
BUN: 17 mg/dL (ref 6–23)
CO2: 31 mEq/L (ref 19–32)
Calcium: 8.6 mg/dL (ref 8.4–10.5)
Chloride: 103 mEq/L (ref 96–112)
Creatinine, Ser: 0.96 mg/dL (ref 0.40–1.20)
GFR: 64.34 mL/min (ref >60.00)
Glucose, Bld: 66 mg/dL — ABNORMAL LOW (ref 70–99)
Potassium: 3.9 mEq/L (ref 3.5–5.1)
Sodium: 139 mEq/L (ref 135–145)
Total Bilirubin: 0.7 mg/dL (ref 0.2–1.2)
Total Protein: 6.6 g/dL (ref 6.0–8.3)

## 2021-06-09 LAB — CBC WITH DIFFERENTIAL/PLATELET
Basophils Absolute: 0.1 10*3/uL (ref 0.0–0.1)
Basophils Relative: 1.4 % (ref 0.0–3.0)
Eosinophils Absolute: 0.2 10*3/uL (ref 0.0–0.7)
Eosinophils Relative: 5 % (ref 0.0–5.0)
HCT: 36.5 % (ref 36.0–46.0)
Hemoglobin: 12.1 g/dL (ref 12.0–15.0)
Lymphocytes Relative: 31.4 % (ref 12.0–46.0)
Lymphs Abs: 1.3 10*3/uL (ref 0.7–4.0)
MCHC: 33.3 g/dL (ref 30.0–36.0)
MCV: 86 fl (ref 78.0–100.0)
Monocytes Absolute: 0.5 10*3/uL (ref 0.1–1.0)
Monocytes Relative: 11.8 % (ref 3.0–12.0)
Neutro Abs: 2 10*3/uL (ref 1.4–7.7)
Neutrophils Relative %: 50.4 % (ref 43.0–77.0)
Platelets: 268 10*3/uL (ref 150.0–400.0)
RBC: 4.24 Mil/uL (ref 3.87–5.11)
RDW: 14.2 % (ref 11.5–15.5)
WBC: 4 10*3/uL (ref 4.0–10.5)

## 2021-06-09 LAB — VITAMIN D 25 HYDROXY (VIT D DEFICIENCY, FRACTURES): VITD: 47.16 ng/mL (ref 30.00–100.00)

## 2021-06-09 LAB — TSH: TSH: 3.68 u[IU]/mL (ref 0.35–5.50)

## 2021-06-09 MED ORDER — LOSARTAN POTASSIUM 100 MG PO TABS: 100.0000 mg | ORAL_TABLET | Freq: Every day | ORAL | 1 refills | Status: DC

## 2021-06-09 MED ORDER — POTASSIUM CHLORIDE CRYS ER 10 MEQ PO TBCR: 20.0000 meq | EXTENDED_RELEASE_TABLET | Freq: Two times a day (BID) | ORAL | 5 refills | Status: DC

## 2021-06-09 NOTE — Assessment & Plan Note (Signed)
Well controlled, no changes to meds. Encouraged heart healthy diet such as the DASH diet and exercise as tolerated.  °

## 2021-06-09 NOTE — Assessment & Plan Note (Signed)
-   check labs today 

## 2021-06-09 NOTE — Assessment & Plan Note (Signed)
ghm utd check labs See avs

## 2021-06-09 NOTE — Assessment & Plan Note (Signed)
Encourage heart healthy diet such as MIND or DASH diet, increase exercise, avoid trans fats, simple carbohydrates and processed foods, consider a krill or fish or flaxseed oil cap daily.  °

## 2021-06-09 NOTE — Assessment & Plan Note (Signed)
.  d/w diet and exercise

## 2021-06-09 NOTE — Progress Notes (Addendum)
Subjective:   By signing my name below, I, [name], attest that this documentation has been prepared under the direction and in the presence of Andrea Held, DO  06/09/2021    Patient ID: [name], female    DOB: [birth year], 61 y.o.   MRN: [id number]  Chief Complaint  Patient presents with   Annual Exam    Pt states not fasting     HPI Patient is in today for a comprehensive physical exam.  She is requesting a refill on 100 mg losartan daily PO, 20 meq potassium chloride tablets daily PO. She reports her leg swelling has not worsened.  She has an upcoming mammogram scheduled but forgot the date she scheduled it.  She continues seeing her GYN specialist regularly.  She denies having any fever, ear pain, muscle pain, new joint pain, new moles, congestion, sinus pain, sore throat, eye pain, chest pain, palpations, cough, SOB, wheezing, n/v/d, constipation, blood in stool, dysuria, frequency, hematuria, or headaches at this time. She has no recent changes to her family medical history. She has no recent surgical procedures completed.  She recently retired from her job.  She is receiving a flu vaccine during this visit. She is interested in receiving the bivalent Covid-19 booster vaccine after this visit.  She does not exercise regularly.  She is UTD on dental and vision care.    Past Medical History:  Diagnosis Date   Abdominal pain, other specified site    Acute upper respiratory infections of unspecified site    Allergy    seasonal   Anemia    Benign neoplasm of skin, site unspecified    Disturbance of skin sensation    Diverticulosis of colon (without mention of hemorrhage)    Family history of diabetes mellitus    Family history of ischemic heart disease    Family history of malignant neoplasm of genital organ, other    GERD (gastroesophageal reflux disease)    Hypertension    Hypokalemia    Left shoulder pain    Low blood potassium     Post-menopausal    Rheumatoid arthritis (HCC)    Sleep apnea    wears cpap    Sprain and strain of unspecified site of knee and leg    Sprain of neck    Thyroid disease    Unspecified essential hypertension     Past Surgical History:  Procedure Laterality Date   ABDOMINAL HYSTERECTOMY     BUNIONECTOMY  2005   right foot   COLONOSCOPY  6-12/2008   SHOULDER SURGERY     left;spurs 2-09- dr Rhoderick Moody   TOTAL ABDOMINAL HYSTERECTOMY W/ BILATERAL SALPINGOOPHORECTOMY  2002    Family History  Problem Relation Age of Onset   Diabetes Mother    Hypertension Mother    Dementia Mother    Stroke Mother    Stroke Maternal Aunt    Breast cancer Maternal Aunt    Prostate cancer Father    Hypertension Father    Cancer Father    Diabetes Brother    Hypertension Sister    Breast cancer Cousin    Colon cancer Neg Hx    Colon polyps Neg Hx    Esophageal cancer Neg Hx    Rectal cancer Neg Hx    Stomach cancer Neg Hx     Social History   Socioeconomic History   Marital status: Married    Spouse name: Not on file   Number of children:  0   Years of education: Not on file   Highest education level: Not on file  Occupational History   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    Employer: [employer]   Occupation: retired    Comment: 03/18/2021  Tobacco Use   Smoking status: Never   Smokeless tobacco: Never  Substance and Sexual Activity   Alcohol use: No   Drug use: No   Sexual activity: Yes    Partners: Male  Other Topics Concern   Not on file  Social History Narrative   No exercise   Social Determinants of Health   Financial Resource Strain: Not on file  Food Insecurity: Not on file  Transportation Needs: Not on file  Physical Activity: Not on file  Stress: Not on file  Social Connections: Not on file  Intimate Partner Violence: Not on file    Outpatient Medications Prior to Visit  Medication Sig Dispense Refill   amLODipine (NORVASC) 10 MG tablet TAKE ONE TABLET BY MOUTH  DAILY 90 tablet 0   Cholecalciferol (VITAMIN D) 50 MCG (2000 UT) CAPS Take 1 capsule (2,000 Units total) by mouth daily. 30 capsule 0   diclofenac (VOLTAREN) 75 MG EC tablet Take 1 tablet (75 mg total) by mouth 2 (two) times daily. 50 tablet 2   estradiol (ESTRACE) 2 MG tablet Take 1 mg by mouth as needed.     fluticasone (FLONASE) 50 MCG/ACT nasal spray INSTILL TWO SPRAYS INTO BOTH NOSTRIL DAILY. 16 g 2   furosemide (LASIX) 40 MG tablet 1 every 3 days if needed. 30 tablet 0   hydrochlorothiazide (HYDRODIURIL) 25 MG tablet Take 1 tablet (25 mg total) by mouth daily. 90 tablet 0   hydroxychloroquine (PLAQUENIL) 200 MG tablet Take 200 mg by mouth 2 (two) times daily.     levothyroxine (SYNTHROID) 50 MCG tablet TAKE ONE TABLET BY MOUTH DAILY WITH BREAKFAST 30 tablet 1   naftifine (NAFTIN) 1 % cream Apply topically daily. 60 g 2   naproxen sodium (ALEVE) 220 MG tablet Take 220 mg by mouth.     RESTASIS 0.05 % ophthalmic emulsion Place 1 drop into both eyes daily.     losartan (COZAAR) 100 MG tablet Take 1 tablet (100 mg total) by mouth daily. 90 tablet 1   potassium chloride (KLOR-CON) 10 MEQ tablet Take 2 tablets (20 mEq total) by mouth 2 (two) times daily. 120 tablet 5   omeprazole (PRILOSEC) 20 MG capsule Take 1 capsule (20 mg total) by mouth daily. 90 capsule 0   No facility-administered medications prior to visit.    No Known Allergies  Review of Systems  Constitutional:  Negative for fever.  HENT:  Negative for congestion and sore throat.   Respiratory:  Negative for cough, shortness of breath and wheezing.   Cardiovascular:  Negative for chest pain.  Gastrointestinal:  Negative for blood in stool, constipation, diarrhea, nausea and vomiting.  Genitourinary:  Negative for dysuria, frequency and hematuria.  Musculoskeletal:  Negative for joint pain and myalgias.  Skin:        (-)New moles  Neurological:  Negative for headaches.      Objective:    Physical Exam Constitutional:       General: She is not in acute distress.    Appearance: Normal appearance. She is not ill-appearing.  HENT:     Head: Normocephalic and atraumatic.     Right Ear: Tympanic membrane, ear canal and external ear normal.     Left Ear: Tympanic membrane, ear  canal and external ear normal.  Eyes:     Extraocular Movements: Extraocular movements intact.     Pupils: Pupils are equal, round, and reactive to light.  Cardiovascular:     Rate and Rhythm: Normal rate and regular rhythm.     Heart sounds: Normal heart sounds. No murmur heard.   No gallop.  Pulmonary:     Effort: Pulmonary effort is normal. No respiratory distress.     Breath sounds: Normal breath sounds. No wheezing or rales.  Abdominal:     General: Bowel sounds are normal. There is no distension.     Palpations: Abdomen is soft.     Tenderness: There is no abdominal tenderness. There is no guarding.  Musculoskeletal:     Right lower leg: Edema present.     Left lower leg: Edema present.  Skin:    General: Skin is warm and dry.  Neurological:     Mental Status: She is alert and oriented to person, place, and time.  Psychiatric:        Behavior: Behavior normal.    BP 130/80 (BP Location: Left Arm, Patient Position: Sitting, Cuff Size: Large)    Pulse 74    Temp 97.8 F (36.6 C) (Oral)    Resp 18    Ht 5\' 8"  (1.727 m)    Wt (!) 308 lb 6.4 oz (139.9 kg)    SpO2 97%    BMI 46.89 kg/m  Wt Readings from Last 3 Encounters:  06/09/21 (!) 308 lb 6.4 oz (139.9 kg)  11/04/20 (!) 304 lb 3.2 oz (138 kg)  06/30/20 (!) 303 lb (137.4 kg)    Diabetic Foot Exam - Simple   No data filed    Lab Results  Component Value Date   WBC 4.5 11/02/2019   HGB 11.4 (L) 11/02/2019   HCT 33.9 (L) 11/02/2019   PLT 239.0 11/02/2019   GLUCOSE 78 11/04/2020   CHOL 180 11/04/2020   TRIG 74.0 11/04/2020   HDL 62.20 11/04/2020   LDLDIRECT 128.5 04/30/2008   LDLCALC 103 (H) 11/04/2020   ALT 10 11/04/2020   AST 12 11/04/2020   NA 141  11/04/2020   K 4.1 11/04/2020   CL 105 11/04/2020   CREATININE 0.99 11/04/2020   BUN 18 11/04/2020   CO2 29 11/04/2020   TSH 2.88 11/04/2020   HGBA1C 5.0 05/28/2020    Lab Results  Component Value Date   TSH 2.88 11/04/2020   Lab Results  Component Value Date   WBC 4.5 11/02/2019   HGB 11.4 (L) 11/02/2019   HCT 33.9 (L) 11/02/2019   MCV 86.5 11/02/2019   PLT 239.0 11/02/2019   Lab Results  Component Value Date   NA 141 11/04/2020   K 4.1 11/04/2020   CO2 29 11/04/2020   GLUCOSE 78 11/04/2020   BUN 18 11/04/2020   CREATININE 0.99 11/04/2020   BILITOT 0.7 11/04/2020   ALKPHOS 59 11/04/2020   AST 12 11/04/2020   ALT 10 11/04/2020   PROT 6.8 11/04/2020   ALBUMIN 3.8 11/04/2020   CALCIUM 8.6 11/04/2020   GFR 62.27 11/04/2020   Lab Results  Component Value Date   CHOL 180 11/04/2020   Lab Results  Component Value Date   HDL 62.20 11/04/2020   Lab Results  Component Value Date   LDLCALC 103 (H) 11/04/2020   Lab Results  Component Value Date   TRIG 74.0 11/04/2020   Lab Results  Component Value Date   CHOLHDL 3 11/04/2020  Lab Results  Component Value Date   HGBA1C 5.0 05/28/2020   Mammogram- Last completed 08/20/2019. Colonoscopy- Last completed 08/03/2019. Results showed: - Two 2 to 4 mm polyps in the transverse colon and in the ascending colon, removed with a cold snare. Resected and retrieved. - Diverticulosis in the sigmoid colon, in the transverse colon and in the ascending colon. - The distal rectum and anal verge are normal on retroflexion view. Otherwise results are normal. Repeat in 5-10 years.      Assessment & Plan:   Problem List Items Addressed This Visit       Unprioritized   Vitamin D deficiency   Relevant Orders   VITAMIN D 25 Hydroxy (Vit-D Deficiency, Fractures)   Class 3 severe obesity with serious comorbidity and body mass index (BMI) of 45.0 to 49.9 in adult [facility])    .d/w diet and exercise      Essential hypertension     Well controlled, no changes to meds. Encouraged heart healthy diet such as the DASH diet and exercise as tolerated.        Relevant Medications   losartan (COZAAR) 100 MG tablet   Other Relevant Orders   CBC with Differential/Platelet   Comprehensive metabolic panel   Lipid panel   TSH   Hyperlipidemia    Encourage heart healthy diet such as MIND or DASH diet, increase exercise, avoid trans fats, simple carbohydrates and processed foods, consider a krill or fish or flaxseed oil cap daily.       Relevant Medications   losartan (COZAAR) 100 MG tablet   Hypothyroidism    .check labs today       Preventative health care - Primary    ghm utd check labs See avs       Relevant Orders   CBC with Differential/Platelet   Comprehensive metabolic panel   Lipid panel   TSH   SLE (systemic lupus erythematosus related syndrome) ([place]), followed by Rheumatology, Rx Plaquenil    Per rheum       Other Visit Diagnoses     Hypokalemia       Relevant Medications   potassium chloride (KLOR-CON M) 10 MEQ tablet   Other Relevant Orders   CBC with Differential/Platelet   Comprehensive metabolic panel   Lipid panel   TSH   Need for influenza vaccination       Relevant Orders   Flu Vaccine QUAD 57mo+IM (Fluarix, Fluzone & Alfiuria Quad PF) (Completed)   Class 3 severe obesity with serious comorbidity and body mass index (BMI) of 40.0 to 44.9 in adult, unspecified obesity type (HCC)   (Chronic)          Meds ordered this encounter  Medications   potassium chloride (KLOR-CON M) 10 MEQ tablet    Sig: Take 2 tablets (20 mEq total) by mouth 2 (two) times daily.    Dispense:  120 tablet    Refill:  5   losartan (COZAAR) 100 MG tablet    Sig: Take 1 tablet (100 mg total) by mouth daily.    Dispense:  90 tablet    Refill:  1    I, Lowne Chase, [employer], DO, personally preformed the services described in this documentation.  All medical record entries made by the scribe were at my  direction and in my presence.  I have reviewed the chart and discharge instructions (if applicable) and agree that the record reflects my personal performance and is accurate and complete. 06/09/2021   I,Andrea  Porter,acting as a Education administrator for [employer], DO.,have documented all relevant documentation on the behalf of Andrea Held, DO,as directed by  Andrea Held, DO while in the presence of Andrea Held, DO.   Andrea Held, DO

## 2021-06-09 NOTE — Patient Instructions (Signed)

## 2021-06-09 NOTE — Progress Notes (Signed)
° °  Covid-19 Vaccination Clinic  Name:  [name]    MRN: [id number] DOB: 1960/05/16  06/09/2021  [name] was observed post Covid-19 immunization for 15 minutes without incident. She was provided with Vaccine Information Sheet and instruction to access the V-Safe system.   [name] was instructed to call 911 with any severe reactions post vaccine: Difficulty breathing  Swelling of face and throat  A fast heartbeat  A bad rash all over body  Dizziness and weakness   Immunizations Administered     Name Date Dose VIS Date Route   Pfizer Covid-19 Vaccine Bivalent Booster 06/09/2021  2:09 PM 0.3 mL 12/17/2020 Intramuscular   Manufacturer: [employer]   Lot: [id number]   [place]: [phone]

## 2021-06-09 NOTE — Assessment & Plan Note (Signed)
Per rheum 

## 2021-06-11 ENCOUNTER — Other Ambulatory Visit (HOSPITAL_BASED_OUTPATIENT_CLINIC_OR_DEPARTMENT_OTHER): Payer: Self-pay

## 2021-06-11 MED ORDER — PFIZER COVID-19 VAC BIVALENT 30 MCG/0.3ML IM SUSP
INTRAMUSCULAR | 0 refills | Status: DC
  Filled 2021-06-11: qty 0.3, 1d supply, fill #0

## 2021-08-31 ENCOUNTER — Other Ambulatory Visit: Payer: Self-pay | Admitting: Family Medicine

## 2021-08-31 DIAGNOSIS — I10 Essential (primary) hypertension: Secondary | ICD-10-CM

## 2021-09-17 ENCOUNTER — Ambulatory Visit (INDEPENDENT_AMBULATORY_CARE_PROVIDER_SITE_OTHER): Payer: 59

## 2021-09-17 ENCOUNTER — Ambulatory Visit: Payer: 59 | Admitting: Podiatry

## 2021-09-17 ENCOUNTER — Encounter: Payer: Self-pay | Admitting: Podiatry

## 2021-09-17 DIAGNOSIS — M722 Plantar fascial fibromatosis: Secondary | ICD-10-CM

## 2021-09-17 MED ORDER — TRIAMCINOLONE ACETONIDE 10 MG/ML IJ SUSP
20.0000 mg | Freq: Once | INTRAMUSCULAR | Status: AC
  Administered 2021-09-17: 20 mg

## 2021-09-17 NOTE — Patient Instructions (Signed)

## 2021-09-17 NOTE — Progress Notes (Signed)
Subjective:   Patient ID: [name], female   DOB: 61 y.o.   MRN: [id number]   HPI Patient presents with significant heel pain of both feet states it is gotten very bad over the last month and she has flatfeet which contributes to it.  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  Reappoint to recheck  X-rays indicate that there is spurring of the plantar heel no indications of stress fracture arthritis with significant flattening of the arch bilateral

## 2021-09-29 ENCOUNTER — Other Ambulatory Visit: Payer: Self-pay | Admitting: Family Medicine

## 2021-09-29 DIAGNOSIS — R1013 Epigastric pain: Secondary | ICD-10-CM

## 2021-10-01 ENCOUNTER — Encounter: Payer: Self-pay | Admitting: Podiatry

## 2021-10-01 ENCOUNTER — Ambulatory Visit: Payer: 59 | Admitting: Podiatry

## 2021-10-01 DIAGNOSIS — M722 Plantar fascial fibromatosis: Secondary | ICD-10-CM

## 2021-10-01 MED ORDER — DICLOFENAC SODIUM 75 MG PO TBEC: 75.0000 mg | DELAYED_RELEASE_TABLET | Freq: Two times a day (BID) | ORAL | 2 refills | Status: DC

## 2021-10-01 MED ORDER — TRIAMCINOLONE ACETONIDE 10 MG/ML IJ SUSP
10.0000 mg | Freq: Once | INTRAMUSCULAR | Status: AC
  Administered 2021-10-01: 10 mg

## 2021-10-02 NOTE — Progress Notes (Signed)
Subjective:   Patient ID: [name], female   DOB: 61 y.o.   MRN: [id number]   HPI Patient states that heels are doing quite a bit better but the left heel is still tender in the medial band when she stands.  Obesity being complicating factor   ROS      Objective:  Physical Exam  Neurovascular status intact continued discomfort in the plantar fascia left at the insertional point of the tendon into the continuous     Assessment:  Acute plantar fasciitis left still present     Plan:  I am satisfied so far that she is improving but I feel like we have to try to get her over the hump and today I did sterile prep and injected the fascia 3 mg Kenalog 5 mg Xylocaine advised on supportive therapy reappoint to recheck as needed

## 2021-10-19 ENCOUNTER — Other Ambulatory Visit: Payer: Self-pay | Admitting: Obstetrics and Gynecology

## 2021-10-19 DIAGNOSIS — R928 Other abnormal and inconclusive findings on diagnostic imaging of breast: Secondary | ICD-10-CM

## 2021-10-21 ENCOUNTER — Other Ambulatory Visit: Payer: Self-pay | Admitting: Family Medicine

## 2021-10-21 DIAGNOSIS — I10 Essential (primary) hypertension: Secondary | ICD-10-CM

## 2021-10-21 MED ORDER — AMLODIPINE BESYLATE 10 MG PO TABS: 10.0000 mg | ORAL_TABLET | Freq: Every day | ORAL | 0 refills | Status: DC

## 2021-10-27 ENCOUNTER — Ambulatory Visit
Admission: RE | Admit: 2021-10-27 | Discharge: 2021-10-27 | Disposition: A | Discharge status: Home / Self Care | Payer: 59 | Source: Ambulatory Visit | Attending: Obstetrics and Gynecology | Admitting: Obstetrics and Gynecology

## 2021-10-27 ENCOUNTER — Other Ambulatory Visit: Payer: Self-pay | Admitting: Obstetrics and Gynecology

## 2021-10-27 DIAGNOSIS — R928 Other abnormal and inconclusive findings on diagnostic imaging of breast: Secondary | ICD-10-CM

## 2021-10-27 DIAGNOSIS — N6310 Unspecified lump in the right breast, unspecified quadrant: Secondary | ICD-10-CM

## 2021-10-30 ENCOUNTER — Ambulatory Visit
Admission: RE | Admit: 2021-10-30 | Discharge: 2021-10-30 | Disposition: A | Discharge status: Home / Self Care | Payer: 59 | Source: Ambulatory Visit | Attending: Obstetrics and Gynecology | Admitting: Obstetrics and Gynecology

## 2021-10-30 ENCOUNTER — Other Ambulatory Visit: Payer: Self-pay | Admitting: Obstetrics and Gynecology

## 2021-10-30 DIAGNOSIS — N6310 Unspecified lump in the right breast, unspecified quadrant: Secondary | ICD-10-CM

## 2021-10-30 HISTORY — PX: BREAST BIOPSY: SHX20

## 2021-11-23 ENCOUNTER — Ambulatory Visit: Payer: Self-pay | Admitting: Surgery

## 2021-11-23 DIAGNOSIS — D242 Benign neoplasm of left breast: Secondary | ICD-10-CM

## 2021-11-25 ENCOUNTER — Encounter (INDEPENDENT_AMBULATORY_CARE_PROVIDER_SITE_OTHER): Payer: Self-pay

## 2021-11-25 ENCOUNTER — Other Ambulatory Visit: Payer: Self-pay | Admitting: Surgery

## 2021-11-25 DIAGNOSIS — D242 Benign neoplasm of left breast: Secondary | ICD-10-CM

## 2021-11-27 ENCOUNTER — Other Ambulatory Visit: Payer: Self-pay | Admitting: Family Medicine

## 2021-11-27 DIAGNOSIS — E876 Hypokalemia: Secondary | ICD-10-CM

## 2021-11-27 DIAGNOSIS — I10 Essential (primary) hypertension: Secondary | ICD-10-CM

## 2021-12-07 ENCOUNTER — Ambulatory Visit: Payer: 59 | Admitting: Family Medicine

## 2021-12-07 ENCOUNTER — Encounter: Payer: Self-pay | Admitting: Family Medicine

## 2021-12-07 DIAGNOSIS — E039 Hypothyroidism, unspecified: Secondary | ICD-10-CM | POA: Diagnosis not present

## 2021-12-07 DIAGNOSIS — I10 Essential (primary) hypertension: Secondary | ICD-10-CM

## 2021-12-07 DIAGNOSIS — E785 Hyperlipidemia, unspecified: Secondary | ICD-10-CM

## 2021-12-07 LAB — CBC WITH DIFFERENTIAL/PLATELET
Basophils Absolute: 0 10*3/uL (ref 0.0–0.1)
Basophils Relative: 1 % (ref 0.0–3.0)
Eosinophils Absolute: 0.2 10*3/uL (ref 0.0–0.7)
Eosinophils Relative: 5 % (ref 0.0–5.0)
HCT: 35.8 % — ABNORMAL LOW (ref 36.0–46.0)
Hemoglobin: 11.9 g/dL — ABNORMAL LOW (ref 12.0–15.0)
Lymphocytes Relative: 32.8 % (ref 12.0–46.0)
Lymphs Abs: 1.3 10*3/uL (ref 0.7–4.0)
MCHC: 33.3 g/dL (ref 30.0–36.0)
MCV: 87.6 fl (ref 78.0–100.0)
Monocytes Absolute: 0.3 10*3/uL (ref 0.1–1.0)
Monocytes Relative: 7.6 % (ref 3.0–12.0)
Neutro Abs: 2.1 10*3/uL (ref 1.4–7.7)
Neutrophils Relative %: 53.6 % (ref 43.0–77.0)
Platelets: 266 10*3/uL (ref 150.0–400.0)
RBC: 4.08 Mil/uL (ref 3.87–5.11)
RDW: 13.8 % (ref 11.5–15.5)
WBC: 4 10*3/uL (ref 4.0–10.5)

## 2021-12-07 LAB — COMPREHENSIVE METABOLIC PANEL
ALT: 12 U/L (ref 0–35)
AST: 11 U/L (ref 0–37)
Albumin: 3.5 g/dL (ref 3.5–5.2)
Alkaline Phosphatase: 58 U/L (ref 39–117)
BUN: 15 mg/dL (ref 6–23)
CO2: 28 mEq/L (ref 19–32)
Calcium: 8.4 mg/dL (ref 8.4–10.5)
Chloride: 103 mEq/L (ref 96–112)
Creatinine, Ser: 0.88 mg/dL (ref 0.40–1.20)
GFR: 71.18 mL/min (ref >60.00)
Glucose, Bld: 86 mg/dL (ref 70–99)
Potassium: 3.7 mEq/L (ref 3.5–5.1)
Sodium: 139 mEq/L (ref 135–145)
Total Bilirubin: 0.7 mg/dL (ref 0.2–1.2)
Total Protein: 6.2 g/dL (ref 6.0–8.3)

## 2021-12-07 LAB — LIPID PANEL
Cholesterol: 163 mg/dL (ref 0–200)
HDL: 57.3 mg/dL (ref >39.00)
LDL Cholesterol: 88 mg/dL (ref 0–99)
NonHDL: 105.86
Total CHOL/HDL Ratio: 3
Triglycerides: 89 mg/dL (ref 0.0–149.0)
VLDL: 17.8 mg/dL (ref 0.0–40.0)

## 2021-12-07 LAB — VITAMIN D 25 HYDROXY (VIT D DEFICIENCY, FRACTURES): VITD: 47.98 ng/mL (ref 30.00–100.00)

## 2021-12-07 LAB — TSH: TSH: 3.19 u[IU]/mL (ref 0.35–5.50)

## 2021-12-07 LAB — HEMOGLOBIN A1C: Hgb A1c MFr Bld: 5.1 % (ref 4.6–6.5)

## 2021-12-07 MED ORDER — AMLODIPINE BESYLATE 10 MG PO TABS: 10.0000 mg | ORAL_TABLET | Freq: Every day | ORAL | 1 refills | Status: DC

## 2021-12-07 NOTE — Assessment & Plan Note (Signed)
Encourage heart healthy diet such as MIND or DASH diet, increase exercise, avoid trans fats, simple carbohydrates and processed foods, consider a krill or fish or flaxseed oil cap daily.  °

## 2021-12-07 NOTE — Assessment & Plan Note (Signed)
Well controlled, no changes to meds. Encouraged heart healthy diet such as the DASH diet and exercise as tolerated.  °

## 2021-12-07 NOTE — Assessment & Plan Note (Signed)
Check thyroid  con't synthroid

## 2021-12-07 NOTE — Progress Notes (Signed)
Subjective:   By signing my name below, I, [name], attest that this documentation has been prepared under the direction and in the presence of [name] DO 12/07/2021   Patient ID: [name], female    DOB: [birth year], 61 y.o.   MRN: [id number]  Chief Complaint  Patient presents with   Hypertension   Hypothyroidism   Follow-up    HPI Patient is in today for an office visit  She is currently taking OTC Vitamin D supplements.   She reports that she has a lumpectomy in her right breast scheduled on 12/30/2021. She reports that the procedure is being performed due to some leakage in her breast  She has not been exercising regularly. She has not been able to walk around her neighborhood because of the amount of loose dogs in the area. She is not a member at a gym at the moment. She is no longer going to [facility]  Past Medical History:  Diagnosis Date   Abdominal pain, other specified site    Acute upper respiratory infections of unspecified site    Allergy    seasonal   Anemia    Benign neoplasm of skin, site unspecified    Disturbance of skin sensation    Diverticulosis of colon (without mention of hemorrhage)    Family history of diabetes mellitus    Family history of ischemic heart disease    Family history of malignant neoplasm of genital organ, other    GERD (gastroesophageal reflux disease)    Hypertension    Hypokalemia    Left shoulder pain    Low blood potassium    Post-menopausal    Rheumatoid arthritis (HCC)    Sleep apnea    wears cpap    Sprain and strain of unspecified site of knee and leg    Sprain of neck    Thyroid disease    Unspecified essential hypertension     Past Surgical History:  Procedure Laterality Date   ABDOMINAL HYSTERECTOMY     BUNIONECTOMY  2005   right foot   COLONOSCOPY  6-12/2008   SHOULDER SURGERY     left;spurs 2-09- dr Rhoderick Moody   TOTAL ABDOMINAL HYSTERECTOMY W/ BILATERAL SALPINGOOPHORECTOMY   2002    Family History  Problem Relation Age of Onset   Diabetes Mother    Hypertension Mother    Dementia Mother    Stroke Mother    Stroke Maternal Aunt    Breast cancer Maternal Aunt    Prostate cancer Father    Hypertension Father    Cancer Father    Diabetes Brother    Hypertension Sister    Breast cancer Cousin    Colon cancer Neg Hx    Colon polyps Neg Hx    Esophageal cancer Neg Hx    Rectal cancer Neg Hx    Stomach cancer Neg Hx     Social History   Socioeconomic History   Marital status: Married    Spouse name: Not on file   Number of children: 0   Years of education: Not on file   Highest education level: Not on file  Occupational History   Occupation: Health visitor CLERK    Employer: [employer]   Occupation: retired    Comment: 03/18/2021  Tobacco Use   Smoking status: Never   Smokeless tobacco: Never  Substance and Sexual Activity   Alcohol use: No   Drug use: No   Sexual activity:  Yes    Partners: Male  Other Topics Concern   Not on file  Social History Narrative   No exercise   Social Determinants of Health   Financial Resource Strain: Not on file  Food Insecurity: Not on file  Transportation Needs: Not on file  Physical Activity: Not on file  Stress: Not on file  Social Connections: Not on file  Intimate Partner Violence: Not on file    Outpatient Medications Prior to Visit  Medication Sig Dispense Refill   Cholecalciferol (VITAMIN D) 50 MCG (2000 UT) CAPS Take 1 capsule (2,000 Units total) by mouth daily. 30 capsule 0   diclofenac (VOLTAREN) 75 MG EC tablet Take 1 tablet (75 mg total) by mouth 2 (two) times daily. 50 tablet 2   estradiol (ESTRACE) 2 MG tablet Take 1 mg by mouth as needed.     fluticasone (FLONASE) 50 MCG/ACT nasal spray INSTILL TWO SPRAYS INTO BOTH NOSTRIL DAILY. 16 g 2   furosemide (LASIX) 40 MG tablet 1 every 3 days if needed. 30 tablet 0   hydrochlorothiazide (HYDRODIURIL) 25 MG tablet TAKE ONE TABLET BY MOUTH  DAILY 30 tablet 2   hydroxychloroquine (PLAQUENIL) 200 MG tablet Take 200 mg by mouth 2 (two) times daily.     levothyroxine (SYNTHROID) 50 MCG tablet TAKE ONE TABLET BY MOUTH DAILY WITH BREAKFAST 30 tablet 1   losartan (COZAAR) 100 MG tablet TAKE ONE TABLET BY MOUTH DAILY 30 tablet 3   naftifine (NAFTIN) 1 % cream Apply topically daily. 60 g 2   naproxen sodium (ALEVE) 220 MG tablet Take 220 mg by mouth.     omeprazole (PRILOSEC) 20 MG capsule TAKE ONE CAPSULE BY MOUTH DAILY 90 capsule 1   potassium chloride (KLOR-CON M) 10 MEQ tablet TAKE TWO TABLETS BY MOUTH TWICE A DAY 120 tablet 5   RESTASIS 0.05 % ophthalmic emulsion Place 1 drop into both eyes daily.     amLODipine (NORVASC) 10 MG tablet TAKE 1 TABLET BY MOUTH DAILY 30 tablet 3   COVID-19 mRNA bivalent vaccine, Pfizer, (PFIZER COVID-19 VAC BIVALENT) injection Inject into the muscle. (Patient not taking: Reported on 12/07/2021) 0.3 mL 0   diclofenac (VOLTAREN) 75 MG EC tablet Take 1 tablet (75 mg total) by mouth 2 (two) times daily. 50 tablet 2   No facility-administered medications prior to visit.    No Known Allergies  Review of Systems  Constitutional:  Negative for fever and malaise/fatigue.  HENT:  Negative for congestion.   Eyes:  Negative for blurred vision.  Respiratory:  Negative for shortness of breath.   Cardiovascular:  Negative for chest pain, palpitations and leg swelling.  Gastrointestinal:  Negative for abdominal pain, blood in stool and nausea.  Genitourinary:  Negative for dysuria and frequency.  Musculoskeletal:  Negative for falls.  Skin:  Negative for rash.  Neurological:  Negative for dizziness, loss of consciousness and headaches.  Endo/Heme/Allergies:  Negative for environmental allergies.  Psychiatric/Behavioral:  Negative for depression. The patient is not nervous/anxious.        Objective:    Physical Exam Vitals and nursing note reviewed.  Constitutional:      General: She is not in acute  distress.    Appearance: Normal appearance. She is not ill-appearing.  HENT:     Head: Normocephalic and atraumatic.     Right Ear: External ear normal.     Left Ear: External ear normal.  Eyes:     Extraocular Movements: Extraocular movements intact.  Pupils: Pupils are equal, round, and reactive to light.  Cardiovascular:     Rate and Rhythm: Normal rate and regular rhythm.     Heart sounds: Normal heart sounds. No murmur heard.    No gallop.  Pulmonary:     Effort: Pulmonary effort is normal. No respiratory distress.     Breath sounds: Normal breath sounds. No wheezing or rales.  Skin:    General: Skin is warm and dry.  Neurological:     Mental Status: She is alert and oriented to person, place, and time.  Psychiatric:        Judgment: Judgment normal.     BP 132/80 (BP Location: Left Arm, Patient Position: Sitting, Cuff Size: Large)   Pulse 73   Temp 97.9 F (36.6 C) (Oral)   Resp 18   Ht '5\' 8"'$  (1.727 m)   Wt (!) 312 lb 9.6 oz (141.8 kg)   SpO2 96%   BMI 47.53 kg/m  Wt Readings from Last 3 Encounters:  12/07/21 (!) 312 lb 9.6 oz (141.8 kg)  06/09/21 (!) 308 lb 6.4 oz (139.9 kg)  11/04/20 (!) 304 lb 3.2 oz (138 kg)    Diabetic Foot Exam - Simple   No data filed    Lab Results  Component Value Date   WBC 4.0 12/07/2021   HGB 11.9 (L) 12/07/2021   HCT 35.8 (L) 12/07/2021   PLT 266.0 12/07/2021   GLUCOSE 86 12/07/2021   CHOL 163 12/07/2021   TRIG 89.0 12/07/2021   HDL 57.30 12/07/2021   LDLDIRECT 128.5 04/30/2008   LDLCALC 88 12/07/2021   ALT 12 12/07/2021   AST 11 12/07/2021   NA 139 12/07/2021   K 3.7 12/07/2021   CL 103 12/07/2021   CREATININE 0.88 12/07/2021   BUN 15 12/07/2021   CO2 28 12/07/2021   TSH 3.19 12/07/2021   HGBA1C 5.1 12/07/2021    Lab Results  Component Value Date   TSH 3.19 12/07/2021   Lab Results  Component Value Date   WBC 4.0 12/07/2021   HGB 11.9 (L) 12/07/2021   HCT 35.8 (L) 12/07/2021   MCV 87.6 12/07/2021    PLT 266.0 12/07/2021   Lab Results  Component Value Date   NA 139 12/07/2021   K 3.7 12/07/2021   CO2 28 12/07/2021   GLUCOSE 86 12/07/2021   BUN 15 12/07/2021   CREATININE 0.88 12/07/2021   BILITOT 0.7 12/07/2021   ALKPHOS 58 12/07/2021   AST 11 12/07/2021   ALT 12 12/07/2021   PROT 6.2 12/07/2021   ALBUMIN 3.5 12/07/2021   CALCIUM 8.4 12/07/2021   GFR 71.18 12/07/2021   Lab Results  Component Value Date   CHOL 163 12/07/2021   Lab Results  Component Value Date   HDL 57.30 12/07/2021   Lab Results  Component Value Date   LDLCALC 88 12/07/2021   Lab Results  Component Value Date   TRIG 89.0 12/07/2021   Lab Results  Component Value Date   CHOLHDL 3 12/07/2021   Lab Results  Component Value Date   HGBA1C 5.1 12/07/2021       Assessment & Plan:   Problem List Items Addressed This Visit       Unprioritized   Hypothyroidism    Check thyroid  con't synthroid       Relevant Orders   TSH (Completed)   Hyperlipidemia    Encourage heart healthy diet such as MIND or DASH diet, increase exercise, avoid trans fats, simple carbohydrates  and processed foods, consider a krill or fish or flaxseed oil cap daily.       Relevant Medications   amLODipine (NORVASC) 10 MG tablet   Essential hypertension    Well controlled, no changes to meds. Encouraged heart healthy diet such as the DASH diet and exercise as tolerated.       Relevant Medications   amLODipine (NORVASC) 10 MG tablet   Other Relevant Orders   CBC with Differential/Platelet (Completed)   Comprehensive metabolic panel (Completed)   Lipid panel (Completed)   Hemoglobin A1c (Completed)   VITAMIN D 25 Hydroxy (Vit-D Deficiency, Fractures) (Completed)   Insulin, random   Other Visit Diagnoses     Morbid obesity ([place])    -  Primary   Relevant Orders   CBC with Differential/Platelet (Completed)   Comprehensive metabolic panel (Completed)   Lipid panel (Completed)   Hemoglobin A1c (Completed)    VITAMIN D 25 Hydroxy (Vit-D Deficiency, Fractures) (Completed)   Insulin, random      Meds ordered this encounter  Medications   amLODipine (NORVASC) 10 MG tablet    Sig: Take 1 tablet (10 mg total) by mouth daily.    Dispense:  90 tablet    Refill:  1    I, [name], DO, personally preformed the services described in this documentation.  All medical record entries made by the scribe were at my direction and in my presence.  I have reviewed the chart and discharge instructions (if applicable) and agree that the record reflects my personal performance and is accurate and complete. 12/07/2021   I,Amber Collins,acting as a scribe for [name], DO.,have documented all relevant documentation on the behalf of [name], DO,as directed by  [name], DO while in the presence of [name], DO.    [name], DO

## 2021-12-07 NOTE — Patient Instructions (Signed)

## 2021-12-08 ENCOUNTER — Other Ambulatory Visit: Payer: Self-pay

## 2021-12-08 LAB — INSULIN, RANDOM: Insulin: 47.6 u[IU]/mL — ABNORMAL HIGH

## 2021-12-15 ENCOUNTER — Other Ambulatory Visit: Payer: Self-pay

## 2021-12-15 ENCOUNTER — Other Ambulatory Visit: Payer: Self-pay | Admitting: Family Medicine

## 2021-12-15 DIAGNOSIS — E785 Hyperlipidemia, unspecified: Secondary | ICD-10-CM

## 2021-12-15 MED ORDER — METFORMIN HCL ER 500 MG PO TB24: 500.0000 mg | ORAL_TABLET | Freq: Every day | ORAL | 0 refills | Status: DC

## 2021-12-23 ENCOUNTER — Encounter (HOSPITAL_BASED_OUTPATIENT_CLINIC_OR_DEPARTMENT_OTHER): Payer: Self-pay | Admitting: Surgery

## 2021-12-29 ENCOUNTER — Encounter (HOSPITAL_BASED_OUTPATIENT_CLINIC_OR_DEPARTMENT_OTHER)
Admission: RE | Admit: 2021-12-29 | Discharge: 2021-12-29 | Disposition: A | Discharge status: Home / Self Care | Payer: 59 | Source: Ambulatory Visit | Attending: Surgery | Admitting: Surgery

## 2021-12-29 ENCOUNTER — Ambulatory Visit
Admission: RE | Admit: 2021-12-29 | Discharge: 2021-12-29 | Disposition: A | Discharge status: Home / Self Care | Payer: 59 | Source: Ambulatory Visit | Attending: Surgery | Admitting: Surgery

## 2021-12-29 DIAGNOSIS — D242 Benign neoplasm of left breast: Secondary | ICD-10-CM

## 2021-12-29 DIAGNOSIS — Z01812 Encounter for preprocedural laboratory examination: Secondary | ICD-10-CM | POA: Diagnosis not present

## 2021-12-29 DIAGNOSIS — I10 Essential (primary) hypertension: Secondary | ICD-10-CM | POA: Diagnosis not present

## 2021-12-29 DIAGNOSIS — N6021 Fibroadenosis of right breast: Secondary | ICD-10-CM | POA: Diagnosis not present

## 2021-12-29 DIAGNOSIS — G4730 Sleep apnea, unspecified: Secondary | ICD-10-CM | POA: Diagnosis not present

## 2021-12-29 DIAGNOSIS — D241 Benign neoplasm of right breast: Secondary | ICD-10-CM | POA: Diagnosis present

## 2021-12-29 DIAGNOSIS — E039 Hypothyroidism, unspecified: Secondary | ICD-10-CM | POA: Diagnosis not present

## 2021-12-29 DIAGNOSIS — N6011 Diffuse cystic mastopathy of right breast: Secondary | ICD-10-CM | POA: Diagnosis not present

## 2021-12-29 DIAGNOSIS — K219 Gastro-esophageal reflux disease without esophagitis: Secondary | ICD-10-CM | POA: Diagnosis not present

## 2021-12-29 NOTE — Progress Notes (Signed)

## 2021-12-30 ENCOUNTER — Ambulatory Visit (HOSPITAL_BASED_OUTPATIENT_CLINIC_OR_DEPARTMENT_OTHER)
Admission: RE | Admit: 2021-12-30 | Discharge: 2021-12-30 | Disposition: A | Discharge status: Home / Self Care | Payer: 59 | Attending: Surgery | Admitting: Surgery

## 2021-12-30 ENCOUNTER — Encounter (HOSPITAL_BASED_OUTPATIENT_CLINIC_OR_DEPARTMENT_OTHER): Payer: Self-pay | Admitting: Surgery

## 2021-12-30 ENCOUNTER — Ambulatory Visit (HOSPITAL_BASED_OUTPATIENT_CLINIC_OR_DEPARTMENT_OTHER): Payer: 59 | Admitting: Certified Registered Nurse Anesthetist

## 2021-12-30 ENCOUNTER — Ambulatory Visit
Admission: RE | Admit: 2021-12-30 | Discharge: 2021-12-30 | Disposition: A | Discharge status: Home / Self Care | Payer: 59 | Source: Ambulatory Visit | Attending: Surgery | Admitting: Surgery

## 2021-12-30 ENCOUNTER — Other Ambulatory Visit: Payer: Self-pay

## 2021-12-30 ENCOUNTER — Encounter (HOSPITAL_BASED_OUTPATIENT_CLINIC_OR_DEPARTMENT_OTHER)
Admission: RE | Disposition: A | Discharge status: Home / Self Care | Payer: Self-pay | Source: Home / Self Care | Attending: Surgery

## 2021-12-30 DIAGNOSIS — D242 Benign neoplasm of left breast: Secondary | ICD-10-CM

## 2021-12-30 DIAGNOSIS — I10 Essential (primary) hypertension: Secondary | ICD-10-CM

## 2021-12-30 DIAGNOSIS — G4730 Sleep apnea, unspecified: Secondary | ICD-10-CM

## 2021-12-30 DIAGNOSIS — Z01818 Encounter for other preprocedural examination: Secondary | ICD-10-CM

## 2021-12-30 DIAGNOSIS — N6021 Fibroadenosis of right breast: Secondary | ICD-10-CM | POA: Insufficient documentation

## 2021-12-30 DIAGNOSIS — E039 Hypothyroidism, unspecified: Secondary | ICD-10-CM

## 2021-12-30 DIAGNOSIS — K219 Gastro-esophageal reflux disease without esophagitis: Secondary | ICD-10-CM | POA: Insufficient documentation

## 2021-12-30 DIAGNOSIS — D241 Benign neoplasm of right breast: Secondary | ICD-10-CM | POA: Insufficient documentation

## 2021-12-30 DIAGNOSIS — N6011 Diffuse cystic mastopathy of right breast: Secondary | ICD-10-CM | POA: Insufficient documentation

## 2021-12-30 HISTORY — PX: BREAST LUMPECTOMY WITH RADIOACTIVE SEED LOCALIZATION: SHX6424

## 2021-12-30 LAB — GLUCOSE, CAPILLARY
Glucose-Capillary: 86 mg/dL (ref 70–99)
Glucose-Capillary: 74 mg/dL (ref 70–99)

## 2021-12-30 SURGERY — BREAST LUMPECTOMY WITH RADIOACTIVE SEED LOCALIZATION
Anesthesia: General | Site: Breast | Laterality: Right

## 2021-12-30 MED ORDER — OXYCODONE HCL 5 MG PO TABS: 5.0000 mg | ORAL_TABLET | Freq: Four times a day (QID) | ORAL | 0 refills | Status: DC | PRN

## 2021-12-30 MED ORDER — SUGAMMADEX SODIUM 500 MG/5ML IV SOLN
INTRAVENOUS | Status: AC
  Filled 2021-12-30: qty 5

## 2021-12-30 MED ORDER — CEFAZOLIN IN SODIUM CHLORIDE 3-0.9 GM/100ML-% IV SOLN
3.0000 g | INTRAVENOUS | Status: AC
  Administered 2021-12-30: 3 g via INTRAVENOUS

## 2021-12-30 MED ORDER — FENTANYL CITRATE (PF) 100 MCG/2ML IJ SOLN
INTRAMUSCULAR | Status: AC
  Filled 2021-12-30: qty 2

## 2021-12-30 MED ORDER — PROPOFOL 10 MG/ML IV BOLUS
INTRAVENOUS | Status: AC
  Filled 2021-12-30: qty 20

## 2021-12-30 MED ORDER — LIDOCAINE HCL (CARDIAC) PF 100 MG/5ML IV SOSY
PREFILLED_SYRINGE | INTRAVENOUS | Status: DC | PRN
  Administered 2021-12-30: 50 mg via INTRAVENOUS

## 2021-12-30 MED ORDER — ROCURONIUM BROMIDE 10 MG/ML (PF) SYRINGE
PREFILLED_SYRINGE | INTRAVENOUS | Status: AC
  Filled 2021-12-30: qty 10

## 2021-12-30 MED ORDER — MIDAZOLAM HCL 5 MG/5ML IJ SOLN
INTRAMUSCULAR | Status: DC | PRN
  Administered 2021-12-30: 2 mg via INTRAVENOUS

## 2021-12-30 MED ORDER — DEXAMETHASONE SODIUM PHOSPHATE 4 MG/ML IJ SOLN
INTRAMUSCULAR | Status: DC | PRN
  Administered 2021-12-30: 8 mg via INTRAVENOUS

## 2021-12-30 MED ORDER — KETOROLAC TROMETHAMINE 30 MG/ML IJ SOLN
INTRAMUSCULAR | Status: AC
  Filled 2021-12-30: qty 1

## 2021-12-30 MED ORDER — FENTANYL CITRATE (PF) 100 MCG/2ML IJ SOLN: 25.0000 ug | INTRAMUSCULAR | Status: DC | PRN

## 2021-12-30 MED ORDER — CHLORHEXIDINE GLUCONATE CLOTH 2 % EX PADS: 6.0000 | MEDICATED_PAD | Freq: Once | CUTANEOUS | Status: DC

## 2021-12-30 MED ORDER — FENTANYL CITRATE (PF) 100 MCG/2ML IJ SOLN
INTRAMUSCULAR | Status: DC | PRN
  Administered 2021-12-30 (×2): 50 ug via INTRAVENOUS

## 2021-12-30 MED ORDER — KETOROLAC TROMETHAMINE 30 MG/ML IJ SOLN
INTRAMUSCULAR | Status: DC | PRN
  Administered 2021-12-30: 30 mg via INTRAVENOUS

## 2021-12-30 MED ORDER — BUPIVACAINE-EPINEPHRINE (PF) 0.25% -1:200000 IJ SOLN
INTRAMUSCULAR | Status: AC
  Filled 2021-12-30: qty 30

## 2021-12-30 MED ORDER — MIDAZOLAM HCL 2 MG/2ML IJ SOLN
INTRAMUSCULAR | Status: AC
  Filled 2021-12-30: qty 2

## 2021-12-30 MED ORDER — SODIUM CHLORIDE 0.9 % IV SOLN
INTRAVENOUS | Status: AC
  Filled 2021-12-30: qty 10

## 2021-12-30 MED ORDER — CEFAZOLIN IN SODIUM CHLORIDE 3-0.9 GM/100ML-% IV SOLN
INTRAVENOUS | Status: AC
  Filled 2021-12-30: qty 100

## 2021-12-30 MED ORDER — ONDANSETRON HCL 4 MG/2ML IJ SOLN
INTRAMUSCULAR | Status: AC
  Filled 2021-12-30: qty 2

## 2021-12-30 MED ORDER — ONDANSETRON HCL 4 MG/2ML IJ SOLN
INTRAMUSCULAR | Status: DC | PRN
  Administered 2021-12-30: 4 mg via INTRAVENOUS

## 2021-12-30 MED ORDER — BUPIVACAINE-EPINEPHRINE (PF) 0.25% -1:200000 IJ SOLN
INTRAMUSCULAR | Status: DC | PRN
  Administered 2021-12-30: 20 mL via PERINEURAL

## 2021-12-30 MED ORDER — LACTATED RINGERS IV SOLN: INTRAVENOUS | Status: DC

## 2021-12-30 MED ORDER — LIDOCAINE 2% (20 MG/ML) 5 ML SYRINGE
INTRAMUSCULAR | Status: AC
  Filled 2021-12-30: qty 5

## 2021-12-30 MED ORDER — SODIUM CHLORIDE 0.9 % IV SOLN
INTRAVENOUS | Status: DC | PRN
  Administered 2021-12-30: 500 mL

## 2021-12-30 MED ORDER — SUCCINYLCHOLINE CHLORIDE 200 MG/10ML IV SOSY
PREFILLED_SYRINGE | INTRAVENOUS | Status: AC
  Filled 2021-12-30: qty 10

## 2021-12-30 MED ORDER — DEXAMETHASONE SODIUM PHOSPHATE 10 MG/ML IJ SOLN
INTRAMUSCULAR | Status: AC
  Filled 2021-12-30: qty 1

## 2021-12-30 MED ORDER — IBUPROFEN 800 MG PO TABS: 800.0000 mg | ORAL_TABLET | Freq: Three times a day (TID) | ORAL | 0 refills | Status: DC | PRN

## 2021-12-30 MED ORDER — PROPOFOL 10 MG/ML IV BOLUS
INTRAVENOUS | Status: DC | PRN
  Administered 2021-12-30: 200 mg via INTRAVENOUS

## 2021-12-30 SURGICAL SUPPLY — 49 items
ADH SKN CLS APL DERMABOND .7 (GAUZE/BANDAGES/DRESSINGS) ×1
APL PRP STRL LF DISP 70% ISPRP (MISCELLANEOUS) ×1
APPLIER CLIP 9.375 MED OPEN (MISCELLANEOUS)
APR CLP MED 9.3 20 MLT OPN (MISCELLANEOUS)
BINDER BREAST LRG (GAUZE/BANDAGES/DRESSINGS) IMPLANT
BINDER BREAST MEDIUM (GAUZE/BANDAGES/DRESSINGS) IMPLANT
BINDER BREAST XLRG (GAUZE/BANDAGES/DRESSINGS) IMPLANT
BINDER BREAST XXLRG (GAUZE/BANDAGES/DRESSINGS) IMPLANT
BLADE SURG 15 STRL LF DISP TIS (BLADE) ×1 IMPLANT
BLADE SURG 15 STRL SS (BLADE) ×1
CANISTER SUC SOCK COL 7IN (MISCELLANEOUS) IMPLANT
CANISTER SUCT 1200ML W/VALVE (MISCELLANEOUS) IMPLANT
CHLORAPREP W/TINT 26 (MISCELLANEOUS) ×1 IMPLANT
CLIP APPLIE 9.375 MED OPEN (MISCELLANEOUS) IMPLANT
COVER BACK TABLE 60X90IN (DRAPES) ×1 IMPLANT
COVER MAYO STAND STRL (DRAPES) ×1 IMPLANT
COVER PROBE W GEL 5X96 (DRAPES) ×1 IMPLANT
DERMABOND ADVANCED .7 DNX12 (GAUZE/BANDAGES/DRESSINGS) ×1 IMPLANT
DRAPE LAPAROSCOPIC ABDOMINAL (DRAPES) IMPLANT
DRAPE LAPAROTOMY 100X72 PEDS (DRAPES) ×1 IMPLANT
DRAPE UTILITY XL STRL (DRAPES) ×1 IMPLANT
ELECT COATED BLADE 2.86 ST (ELECTRODE) ×1 IMPLANT
ELECT REM PT RETURN 9FT ADLT (ELECTROSURGICAL) ×1
ELECTRODE REM PT RTRN 9FT ADLT (ELECTROSURGICAL) ×1 IMPLANT
GLOVE BIOGEL PI IND STRL 8 (GLOVE) ×1 IMPLANT
GLOVE ECLIPSE 8.0 STRL XLNG CF (GLOVE) ×1 IMPLANT
GOWN STRL REUS W/ TWL LRG LVL3 (GOWN DISPOSABLE) ×2 IMPLANT
GOWN STRL REUS W/ TWL XL LVL3 (GOWN DISPOSABLE) ×1 IMPLANT
GOWN STRL REUS W/TWL LRG LVL3 (GOWN DISPOSABLE) ×2
GOWN STRL REUS W/TWL XL LVL3 (GOWN DISPOSABLE) ×1
HEMOSTAT ARISTA ABSORB 3G PWDR (HEMOSTASIS) IMPLANT
HEMOSTAT SNOW SURGICEL 2X4 (HEMOSTASIS) IMPLANT
KIT MARKER MARGIN INK (KITS) ×1 IMPLANT
NDL HYPO 25X1 1.5 SAFETY (NEEDLE) ×1 IMPLANT
NEEDLE HYPO 25X1 1.5 SAFETY (NEEDLE) ×1 IMPLANT
NS IRRIG 1000ML POUR BTL (IV SOLUTION) ×1 IMPLANT
PACK BASIN DAY SURGERY FS (CUSTOM PROCEDURE TRAY) ×1 IMPLANT
PENCIL SMOKE EVACUATOR (MISCELLANEOUS) ×1 IMPLANT
SLEEVE SCD COMPRESS KNEE MED (STOCKING) ×1 IMPLANT
SPIKE FLUID TRANSFER (MISCELLANEOUS) IMPLANT
SPONGE T-LAP 4X18 ~~LOC~~+RFID (SPONGE) ×1 IMPLANT
SUT MNCRL AB 4-0 PS2 18 (SUTURE) ×1 IMPLANT
SUT SILK 2 0 SH (SUTURE) IMPLANT
SUT VICRYL 3-0 CR8 SH (SUTURE) ×1 IMPLANT
SYR CONTROL 10ML LL (SYRINGE) ×1 IMPLANT
TOWEL GREEN STERILE FF (TOWEL DISPOSABLE) ×1 IMPLANT
TRAY FAXITRON CT DISP (TRAY / TRAY PROCEDURE) ×1 IMPLANT
TUBE CONNECTING 20X1/4 (TUBING) IMPLANT
YANKAUER SUCT BULB TIP NO VENT (SUCTIONS) IMPLANT

## 2021-12-30 NOTE — Op Note (Signed)
Preoperative diagnosis: Right breast papilloma  Postop diagnosis: Same  Procedure: Right breast seed localized lumpectomy  Surgeon: Thomas Cornett, MD  Assistant: Dr. Nash MD  I was personally present/performed  the key and critical portions of this procedure and immediately available throughout the entire procedure, as documented in my operative note.    Anesthesia: LMA with 0.25% Marcaine plain  EBL: Minimal  Specimen: Right breast tissue with seed and clip verified by Faxitron  IV fluids: Per anesthesia record  Indications for procedure: The patient presents for right breast seed lumpectomy due to papilloma found on mammogram and core biopsy.  We discussed observation versus excision.  We discussed potential upgrade risk of these lesions and core biopsy.  After discussion the above she wished to proceed with right breast seed localized lumpectomy. The procedure has been discussed with the patient. Alternatives to surgery have been discussed with the patient.  Risks of surgery include bleeding,  Infection,  Seroma formation, death,  and the need for further surgery.   The patient understands and wishes to proceed.     Description of procedure: The patient was met in the holding area and questions were answered.  Of note she underwent seed placement as an outpatient.  Right breast was marked as correct site.  She was then taken back to the operating room.  She was placed upon upon the OR table.  After induction of general esthesia, right breast was prepped and draped in sterile fashion and timeout performed.  Proper patient, site and procedure verified.  Neoprobe used identify the seed right breast upper inner quadrant.  Local anesthetic was infiltrated.  Incision was made over the signal and dissection was carried down.  All tissue and the seed and clip were excised with a grossly negative margin.  The cavity is found to be hemostatic with cautery.  Irrigation was used.  Local anesthetic  infiltrated.  Cavity is closed with a deep layer 3-0 Vicryl.  4 Monocryl was used to close the skin in a subcuticular fashion.  Dermabond applied.  All counts found to be correct.  Patient was awoke extubated taken recovery in satisfactory condition. 

## 2021-12-30 NOTE — Anesthesia Procedure Notes (Signed)
Procedure Name: LMA Insertion Date/Time: 12/30/2021 2:11 PM  Performed by: Bufford Spikes, CRNAPre-anesthesia Checklist: Patient identified, Emergency Drugs available, Suction available and Patient being monitored Patient Re-evaluated:Patient Re-evaluated prior to induction Oxygen Delivery Method: Circle system utilized Preoxygenation: Pre-oxygenation with 100% oxygen Induction Type: IV induction Ventilation: Mask ventilation without difficulty LMA: LMA inserted LMA Size: 4.0 Number of attempts: 1 Placement Confirmation: positive ETCO2 Tube secured with: Tape Dental Injury: Teeth and Oropharynx as per pre-operative assessment

## 2021-12-30 NOTE — Anesthesia Preprocedure Evaluation (Addendum)
Anesthesia Evaluation  Patient identified by MRN, date of birth, ID band Patient awake    Reviewed: Allergy & Precautions, NPO status , Patient's Chart, lab work & pertinent test results  Airway Mallampati: II       Dental   Pulmonary sleep apnea ,    breath sounds clear to auscultation       Cardiovascular hypertension,  Rhythm:Regular Rate:Normal     Neuro/Psych    GI/Hepatic GERD  ,  Endo/Other  Hypothyroidism   Renal/GU negative Renal ROS     Musculoskeletal  (+) Arthritis ,   Abdominal   Peds  Hematology   Anesthesia Other Findings   Reproductive/Obstetrics                             Anesthesia Physical Anesthesia Plan  ASA: 3  Anesthesia Plan: General   Post-op Pain Management:    Induction:   PONV Risk Score and Plan: 3 and Ondansetron, Dexamethasone and Midazolam  Airway Management Planned: LMA  Additional Equipment:   Intra-op Plan:   Post-operative Plan: Extubation in OR  Informed Consent: I have reviewed the patients History and Physical, chart, labs and discussed the procedure including the risks, benefits and alternatives for the proposed anesthesia with the patient or authorized representative who has indicated his/her understanding and acceptance.     Dental advisory given  Plan Discussed with: CRNA and Anesthesiologist  Anesthesia Plan Comments:        Anesthesia Quick Evaluation

## 2021-12-30 NOTE — Transfer of Care (Signed)
Immediate Anesthesia Transfer of Care Note  Patient: [name]  Procedure(s) Performed: RIGHT BREAST LUMPECTOMY WITH RADIOACTIVE SEED LOCALIZATION (Right: Breast)  Patient Location: PACU  Anesthesia Type:GEN  Level of Consciousness: awake, alert  and oriented  Airway & Oxygen Therapy: Patient Spontanous Breathing and Patient connected to nasal cannula oxygen  Post-op Assessment: Report given to RN and Post -op Vital signs reviewed and stable  Post vital signs: Reviewed and stable  Last Vitals:  Vitals Value Taken Time  BP 144/62 12/30/21 1504  Temp    Pulse 77 12/30/21 1509  Resp 14 12/30/21 1509  SpO2 99 % 12/30/21 1509  Vitals shown include unvalidated device data.  Last Pain:  Vitals:   12/30/21 1133  TempSrc: Oral  PainSc: 0-No pain      Patients Stated Pain Goal: 3 (96/28/36 6294)  Complications: No notable events documented.

## 2021-12-30 NOTE — Discharge Instructions (Addendum)
[place] Office Phone Number [phone]  BREAST BIOPSY/ PARTIAL MASTECTOMY: POST OP INSTRUCTIONS  Always review your discharge instruction sheet given to you by the facility where your surgery was performed.  IF YOU HAVE DISABILITY OR FAMILY LEAVE FORMS, YOU MUST BRING THEM TO THE OFFICE FOR PROCESSING.  DO NOT GIVE THEM TO YOUR DOCTOR.  A prescription for pain medication may be given to you upon discharge.  Take your pain medication as prescribed, if needed.  If narcotic pain medicine is not needed, then you may take acetaminophen (Tylenol) or ibuprofen (Advil) as needed. Take your usually prescribed medications unless otherwise directed If you need a refill on your pain medication, please contact your pharmacy.  They will contact our office to request authorization.  Prescriptions will not be filled after 5pm or on week-ends. You should eat very light the first 24 hours after surgery, such as soup, crackers, pudding, etc.  Resume your normal diet the day after surgery. Most patients will experience some swelling and bruising in the breast.  Ice packs and a good support bra will help.  Swelling and bruising can take several days to resolve.  It is common to experience some constipation if taking pain medication after surgery.  Increasing fluid intake and taking a stool softener will usually help or prevent this problem from occurring.  A mild laxative (Milk of Magnesia or Miralax) should be taken according to package directions if there are no bowel movements after 48 hours. Unless discharge instructions indicate otherwise, you may remove your bandages 24-48 hours after surgery, and you may shower at that time.  You may have steri-strips (small skin tapes) in place directly over the incision.  These strips should be left on the skin for 7-10 days.  If your surgeon used skin glue on the incision, you may shower in 24 hours.  The glue will flake off over the next 2-3 weeks.  Any  sutures or staples will be removed at the office during your follow-up visit. ACTIVITIES:  You may resume regular daily activities (gradually increasing) beginning the next day.  Wearing a good support bra or sports bra minimizes pain and swelling.  You may have sexual intercourse when it is comfortable. You may drive when you no longer are taking prescription pain medication, you can comfortably wear a seatbelt, and you can safely maneuver your car and apply brakes. RETURN TO WORK:  ______________________________________________________________________________________ Dennis Bast should see your doctor in the office for a follow-up appointment approximately two weeks after your surgery.  Your doctor's nurse will typically make your follow-up appointment when she calls you with your pathology report.  Expect your pathology report 2-3 business days after your surgery.  You may call to check if you do not hear from [place] after three days. OTHER INSTRUCTIONS: _______________________________________________________________________________________________ _____________________________________________________________________________________________________________________________________ _____________________________________________________________________________________________________________________________________ _____________________________________________________________________________________________________________________________________  WHEN TO CALL YOUR DOCTOR: Fever over 101.0 Nausea and/or vomiting. Extreme swelling or bruising. Continued bleeding from incision. Increased pain, redness, or drainage from the incision.  The clinic staff is available to answer your questions during regular business hours.  Please don't hesitate to call and ask to speak to one of the nurses for clinical concerns.  If you have a medical emergency, go to the nearest emergency room or call 911.  A surgeon from [facility] is always on call at the hospital.  For further questions, please visit [email or web address]    May take NSAIDS (ibuprofen, motrin) after 8:40pm, if needed.   [place]  Instructions  Activity: Get plenty of rest for the remainder of the day. A responsible individual must stay with you for 24 hours following the procedure.  For the next 24 hours, DO NOT: -Drive a car -Paediatric nurse -Drink alcoholic beverages -Take any medication unless instructed by your physician -Make any legal decisions or sign important papers.  Meals: Start with liquid foods such as gelatin or soup. Progress to regular foods as tolerated. Avoid greasy, spicy, heavy foods. If nausea and/or vomiting occur, drink only clear liquids until the nausea and/or vomiting subsides. Call your physician if vomiting continues.  Special Instructions/Symptoms: Your throat may feel dry or sore from the anesthesia or the breathing tube placed in your throat during surgery. If this causes discomfort, gargle with warm salt water. The discomfort should disappear within 24 hours.  If you had a scopolamine patch placed behind your ear for the management of post- operative nausea and/or vomiting:  1. The medication in the patch is effective for 72 hours, after which it should be removed.  Wrap patch in a tissue and discard in the trash. Wash hands thoroughly with soap and water. 2. You may remove the patch earlier than 72 hours if you experience unpleasant side effects which may include dry mouth, dizziness or visual disturbances. 3. Avoid touching the patch. Wash your hands with soap and water after contact with the patch.

## 2021-12-30 NOTE — Interval H&P Note (Signed)
History and Physical Interval Note:  12/30/2021 12:47 PM  [name]  has presented today for surgery, with the diagnosis of RIGHT BREAST PAPILLOMA.  The various methods of treatment have been discussed with the patient and family. After consideration of risks, benefits and other options for treatment, the patient has consented to  Procedure(s): RIGHT BREAST LUMPECTOMY WITH RADIOACTIVE SEED LOCALIZATION (Right) as a surgical intervention.  The patient's history has been reviewed, patient examined, no change in status, stable for surgery.  I have reviewed the patient's chart and labs.  Questions were answered to the patient's satisfaction.     [place]

## 2021-12-30 NOTE — H&P (Signed)
History of Present Illness: [name] is a 61 y.o. female who is seen today as an office consultation for evaluation of Breast Mass .   Patient presents for evaluation of abnormal right breast mammogram. She was found to have small mass upper inner quadrant right breast on recent screening mammogram. Core biopsy showed papilloma. She also history of right nipple discharge for over a year which is clear. No history of mass or discharge involving the left nipple. No history of pain or any significant change in the appearance of either breast.  Review of Systems: A complete review of systems was obtained from the patient. I have reviewed this information and discussed as appropriate with the patient. See HPI as well for other ROS.    Medical History: Past Medical History:  Diagnosis Date  Arthritis  GERD (gastroesophageal reflux disease)  Hypertension  Thyroid disease   There is no problem list on file for this patient.  Past Surgical History:  Procedure Laterality Date  foot surgery  HYSTERECTOMY  shoulder surgery    No Known Allergies  Current Outpatient Medications on File Prior to Visit  Medication Sig Dispense Refill  amLODIPine (NORVASC) 10 MG tablet  cholecalciferol (VITAMIN D3) 2,000 unit capsule 1 tablet Orally Once a day  diclofenac (VOLTAREN) 75 MG EC tablet  estradioL (ESTRACE) 2 MG tablet Take 1 tablet by mouth once daily  fluticasone propionate (FLONASE) 50 mcg/actuation nasal spray  FUROsemide (LASIX) 40 MG tablet Take 1 tablet every day by oral route as needed for 30 days.  hydroCHLOROthiazide (HYDRODIURIL) 25 MG tablet  hydrOXYchloroQUINE (PLAQUENIL) 200 mg tablet  levothyroxine (SYNTHROID) 50 MCG tablet Take 1 tablet by mouth daily with breakfast  losartan (COZAAR) 100 MG tablet  naftifine (NAFTIN) 1 % cream Apply 1 Application! topically once daily  naproxen sodium (ALEVE) 220 MG tablet Take 1 tablet as needed by oral route.  omeprazole (PRILOSEC) 20 MG DR  capsule  potassium chloride (KLOR-CON) 10 mEq ER tablet  RESTASIS 0.05 % ophthalmic emulsion   No current facility-administered medications on file prior to visit.   Family History  Problem Relation Age of Onset  Skin cancer Mother  High blood pressure (Hypertension) Mother  Diabetes Mother  Skin cancer Other  Breast cancer Other    Social History   Tobacco Use  Smoking Status Never  Smokeless Tobacco Never    Social History   Socioeconomic History  Marital status: Married  Tobacco Use  Smoking status: Never  Smokeless tobacco: Never   Objective:   Vitals:  11/23/21 1339  BP: 124/80  Pulse: 80  Weight: (!) 142 kg (313 lb)  Height: 172.7 cm ('5\' 8"'$ )   Body mass index is 47.59 kg/m.  Physical Exam HENT:  Head: Normocephalic.  Pulmonary:  Effort: Pulmonary effort is normal.  Chest:  Breasts: Right: Normal. No swelling, inverted nipple, mass or nipple discharge.  Left: Normal. No swelling, inverted nipple, mass or nipple discharge.  Musculoskeletal:  General: Normal range of motion.  Skin: General: Skin is warm.  Neurological:  General: No focal deficit present.  Mental Status: She is alert.  Psychiatric:  Mood and Affect: Mood normal.  Behavior: Behavior normal.    Labs, Imaging and Diagnostic Testing: Diagnosis Breast, right, needle core biopsy, 1 o'clock, 7cmfn/UIQ (ribbon clip) - INTRADUCTAL PAPILLOMA WITH USUAL DUCTAL HYPERPLASIA. - SEE NOTE. Diagnosis Note Called CLINICAL DATA: Status post ultrasound-guided core biopsy of the right breast.  EXAM: 3D DIAGNOSTIC RIGHT MAMMOGRAM POST ULTRASOUND BIOPSY  COMPARISON: Previous exam(s).  FINDINGS: 3D Mammographic images were obtained following ultrasound guided biopsy of the right breast. The biopsy marking clip is in expected location in the upper-inner quadrant of the right breast.  IMPRESSION: Appropriate positioning of the ribbon shaped biopsy marking clip at the site of biopsy in the  upper-inner quadrant of the right breast.  Final Assessment: Post Procedure Mammograms for Marker Placement   Electronically Signed By: Lillia Mountain M.D. On: 10/30/2021 10:29to the [facility] on 10/03/2021.  Assessment and Plan:   Diagnoses and all orders for this visit:  Papilloma of right breast    Papilloma with hyperplasia. We discussed the pros and. Discussed complications of surgery as well as long-term recovery and expectations from that. Discussed observation. In light of hyperplasia, recommend removal of the right breast papilloma. Plan is for right breast seed localized lumpectomy. Risks and benefits of surgery discussed as well as alternatives.The procedure has been discussed with the patient. Alternatives to surgery have been discussed with the patient. Risks of surgery include bleeding, Infection, Seroma formation, death, and the need for further surgery. The patient understands and wishes to proceed.   No follow-ups on file.  Kennieth Francois, MD

## 2021-12-31 ENCOUNTER — Encounter (HOSPITAL_BASED_OUTPATIENT_CLINIC_OR_DEPARTMENT_OTHER): Payer: Self-pay | Admitting: Surgery

## 2021-12-31 NOTE — Anesthesia Postprocedure Evaluation (Signed)
Anesthesia Post Note  Patient: [name]  Procedure(s) Performed: RIGHT BREAST LUMPECTOMY WITH RADIOACTIVE SEED LOCALIZATION (Right: Breast)     Patient location during evaluation: PACU Anesthesia Type: General Level of consciousness: awake and alert Pain management: pain level controlled Vital Signs Assessment: post-procedure vital signs reviewed and stable Respiratory status: spontaneous breathing, nonlabored ventilation, respiratory function stable and patient connected to nasal cannula oxygen Cardiovascular status: blood pressure returned to baseline and stable Postop Assessment: no apparent nausea or vomiting Anesthetic complications: no   No notable events documented.  Last Vitals:  Vitals:   12/30/21 1523 12/30/21 1600  BP: (!) 147/64 (!) 152/60  Pulse: 70 72  Resp: 16 16  Temp:  (!) 36.3 C  SpO2: 97% 98%    Last Pain:  Vitals:   12/30/21 1600  TempSrc:   PainSc: 0-No pain                 Loyd Marhefka

## 2022-01-01 LAB — SURGICAL PATHOLOGY

## 2022-01-03 ENCOUNTER — Encounter: Payer: Self-pay | Admitting: Surgery

## 2022-01-26 ENCOUNTER — Encounter (HOSPITAL_COMMUNITY): Payer: Self-pay

## 2022-01-28 ENCOUNTER — Other Ambulatory Visit (HOSPITAL_COMMUNITY): Payer: Self-pay

## 2022-02-25 ENCOUNTER — Telehealth: Payer: 59 | Admitting: Family

## 2022-02-25 ENCOUNTER — Encounter: Payer: Self-pay | Admitting: Family

## 2022-02-25 ENCOUNTER — Telehealth: Payer: Self-pay | Admitting: Family Medicine

## 2022-02-25 ENCOUNTER — Other Ambulatory Visit: Payer: Self-pay | Admitting: Family

## 2022-02-25 ENCOUNTER — Other Ambulatory Visit (HOSPITAL_BASED_OUTPATIENT_CLINIC_OR_DEPARTMENT_OTHER): Payer: Self-pay

## 2022-02-25 VITALS — BP 83/54 | HR 73 | Ht 68.0 in | Wt 311.0 lb

## 2022-02-25 DIAGNOSIS — U071 COVID-19: Secondary | ICD-10-CM | POA: Diagnosis not present

## 2022-02-25 MED ORDER — MOLNUPIRAVIR EUA 200MG CAPSULE: 4.0000 | ORAL_CAPSULE | Freq: Two times a day (BID) | ORAL | 0 refills | Status: DC

## 2022-02-25 MED ORDER — MOLNUPIRAVIR EUA 200MG CAPSULE
4.0000 | ORAL_CAPSULE | Freq: Two times a day (BID) | ORAL | 0 refills | Status: AC
  Filled 2022-02-25: qty 40, 5d supply, fill #0

## 2022-02-25 NOTE — Progress Notes (Signed)
[name] is a 61 y.o. female with the following history as recorded in EpicCare:  Patient Active Problem List   Diagnosis Date Noted   Preventative health care 06/09/2021   Peripheral edema 02/27/2020   OSA on CPAP 09/19/2019   Acute non-recurrent pansinusitis 04/26/2019   Counseled about COVID-19 virus infection 04/26/2019   Insulin resistance 03/07/2019   Vitamin D deficiency 03/07/2019   Diverticulosis 03/07/2019   Obesity with alveolar hypoventilation and body mass 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 ([place]) 03/01/2019   Excessive daytime sleepiness 03/01/2019   Class 3 severe obesity with serious comorbidity and body mass index (BMI) of 45.0 to 49.9 in adult ([place]) 03/01/2019   Gastroesophageal reflux disease without esophagitis 03/01/2019   Hypothyroidism 06/15/2018   SLE (systemic lupus erythematosus related syndrome) ([place]), followed by Rheumatology, Rx Plaquenil 10/13/2017   Hyperlipidemia 01/20/2012   Osteoarthritis 04/23/2009   Essential hypertension 05/18/2006    Current Outpatient Medications  Medication Sig Dispense Refill   amLODipine (NORVASC) 10 MG tablet Take 1 tablet (10 mg total) by mouth daily. 90 tablet 1   Cholecalciferol (VITAMIN D) 50 MCG (2000 UT) CAPS Take 1 capsule (2,000 Units total) by mouth daily. 30 capsule 0   estradiol (ESTRACE) 2 MG tablet Take 1 mg by mouth as needed.     fluticasone (FLONASE) 50 MCG/ACT nasal spray INSTILL TWO SPRAYS INTO BOTH NOSTRIL DAILY. 16 g 2   furosemide (LASIX) 40 MG tablet 1 every 3 days if needed. 30 tablet 0   hydrochlorothiazide (HYDRODIURIL) 25 MG tablet TAKE ONE TABLET BY MOUTH DAILY 30 tablet 2   hydroxychloroquine (PLAQUENIL) 200 MG tablet Take 200 mg by mouth 2 (two) times daily.     ibuprofen (ADVIL) 800 MG tablet Take 1 tablet (800 mg total) by mouth every 8 (eight) hours as needed. 30 tablet 0   losartan (COZAAR) 100 MG tablet TAKE ONE TABLET BY MOUTH DAILY 30 tablet 3   metFORMIN (GLUCOPHAGE-XR) 500 MG 24 hr tablet  Take 1 tablet (500 mg total) by mouth daily with breakfast. 90 tablet 0   molnupiravir EUA (LAGEVRIO) 200 mg CAPS capsule Take 4 capsules (800 mg total) by mouth 2 (two) times daily for 5 days. 40 capsule 0   naftifine (NAFTIN) 1 % cream Apply topically daily. 60 g 2   naproxen sodium (ALEVE) 220 MG tablet Take 220 mg by mouth.     omeprazole (PRILOSEC) 20 MG capsule TAKE ONE CAPSULE BY MOUTH DAILY 90 capsule 1   oxyCODONE (OXY IR/ROXICODONE) 5 MG immediate release tablet Take 1 tablet (5 mg total) by mouth every 6 (six) hours as needed for severe pain. 15 tablet 0   potassium chloride (KLOR-CON M) 10 MEQ tablet TAKE TWO TABLETS BY MOUTH TWICE A DAY 120 tablet 5   RESTASIS 0.05 % ophthalmic emulsion Place 1 drop into both eyes daily.     No current facility-administered medications for this visit.    Allergies: Patient has no known allergies.  Past Medical History:  Diagnosis Date   Abdominal pain, other specified site    Acute upper respiratory infections of unspecified site    Allergy    seasonal   Anemia    Benign neoplasm of skin, site unspecified    Disturbance of skin sensation    Diverticulosis of colon (without mention of hemorrhage)    Family history of diabetes mellitus    Family history of ischemic heart disease    Family history of malignant neoplasm of genital  organ, other    GERD (gastroesophageal reflux disease)    Hypertension    Hypokalemia    Left shoulder pain    Low blood potassium    Post-menopausal    Rheumatoid arthritis (HCC)    Sleep apnea    wears cpap    Sprain and strain of unspecified site of knee and leg    Sprain of neck    Thyroid disease    Unspecified essential hypertension     Past Surgical History:  Procedure Laterality Date   ABDOMINAL HYSTERECTOMY     BREAST BIOPSY Right 10/30/2021   BREAST LUMPECTOMY WITH RADIOACTIVE SEED LOCALIZATION Right 12/30/2021   Procedure: RIGHT BREAST LUMPECTOMY WITH RADIOACTIVE SEED LOCALIZATION;  Surgeon:  Erroll Luna, MD;  Location: [facility];  Service: General;  Laterality: Right;   BUNIONECTOMY  2005   right foot   COLONOSCOPY  6-12/2008   SHOULDER SURGERY     left;spurs 2-09- dr Rhoderick Moody   TOTAL ABDOMINAL HYSTERECTOMY W/ BILATERAL SALPINGOOPHORECTOMY  2002    Family History  Problem Relation Age of Onset   Diabetes Mother    Hypertension Mother    Dementia Mother    Stroke Mother    Stroke Maternal Aunt    Breast cancer Maternal Aunt    Prostate cancer Father    Hypertension Father    Cancer Father    Diabetes Brother    Hypertension Sister    Breast cancer Cousin    Colon cancer Neg Hx    Colon polyps Neg Hx    Esophageal cancer Neg Hx    Rectal cancer Neg Hx    Stomach cancer Neg Hx     Social History   Tobacco Use   Smoking status: Never   Smokeless tobacco: Never  Substance Use Topics   Alcohol use: No    Subjective:    I connected with [name] on 02/25/22 at  1:00 PM EST by a video enabled telemedicine application and verified that I am speaking with the correct person using two identifiers.   I discussed the limitations of evaluation and management by telemedicine and the availability of in person appointments. The patient expressed understanding and agreed to proceed. Provider in office/ patient is at home; provider and patient are only 2 people on video call.   Started yesterday with sudden onset of body aches/ nasal congestion/ headache; tested positive for COVID yesterday; using Aleve and Coricidin for symptom relief;     Objective:  Vitals:   02/25/22 1307  BP: (!) 83/54  Pulse: 73  Weight: (!) 311 lb (141.1 kg)  Height: '5\' 8"'$  (1.727 m)    General: Well developed, well nourished, in no acute distress  Skin : Warm and dry.  Head: Normocephalic and atraumatic  Lungs: Respirations unlabored;  Neurologic: Alert and oriented; speech intact; face symmetrical;   Assessment:  1. COVID     Plan:   Rx for Molnupiravir take 4  tablets po bid x 5 days; continue symptomatic treatment including Aleve and Coricidin; increase fluids, rest and follow up worse, no better.   No follow-ups on file.  No orders of the defined types were placed in this encounter.   Requested Prescriptions   Signed Prescriptions Disp Refills   molnupiravir EUA (LAGEVRIO) 200 mg CAPS capsule 40 capsule 0    Sig: Take 4 capsules (800 mg total) by mouth 2 (two) times daily for 5 days.

## 2022-02-25 NOTE — Telephone Encounter (Signed)
I have called pt back and informed her that she needs to call the pharmacy to ensure that they have the medication before we send it to multiple pharmacies. Once she knows which one has it then she can tell us and we will send it to that one. I have given her the name of the RX and spelled it for her.

## 2022-02-25 NOTE — Telephone Encounter (Signed)
Patient called back to request medication be sent to pharmacy downstairs.

## 2022-02-25 NOTE — Telephone Encounter (Signed)
Patient states her pharmacy does not have the medication sent to her today by Mickel Baas, she stated the next closest pharmacy to her is [facility] on [address]. Please advise.

## 2022-02-26 ENCOUNTER — Telehealth: Payer: Self-pay | Admitting: Family Medicine

## 2022-02-26 NOTE — Telephone Encounter (Signed)
I have called the pt back and informed that it will take some time for her body to get over Covid. I can not tell her exactly when her congestion will clear up but I told her that it may take some time since she was just positive yesterday. She stated understanding and will take some congestion medication in the mean time.

## 2022-02-26 NOTE — Telephone Encounter (Signed)
Pt has been taking LAGEVRIO and her pain has gone away but she would like to know when the congestion will clear up.

## 2022-06-13 ENCOUNTER — Other Ambulatory Visit: Payer: Self-pay | Admitting: Family Medicine

## 2022-06-14 ENCOUNTER — Ambulatory Visit (INDEPENDENT_AMBULATORY_CARE_PROVIDER_SITE_OTHER): Payer: 59 | Admitting: Family Medicine

## 2022-06-14 ENCOUNTER — Encounter: Payer: Self-pay | Admitting: Family Medicine

## 2022-06-14 VITALS — BP 132/82 | HR 78 | Temp 97.6°F | Resp 18 | Ht 68.0 in | Wt 305.0 lb

## 2022-06-14 DIAGNOSIS — G4733 Obstructive sleep apnea (adult) (pediatric): Secondary | ICD-10-CM

## 2022-06-14 DIAGNOSIS — I10 Essential (primary) hypertension: Secondary | ICD-10-CM

## 2022-06-14 DIAGNOSIS — E88819 Insulin resistance, unspecified: Secondary | ICD-10-CM

## 2022-06-14 DIAGNOSIS — M329 Systemic lupus erythematosus, unspecified: Secondary | ICD-10-CM

## 2022-06-14 DIAGNOSIS — E785 Hyperlipidemia, unspecified: Secondary | ICD-10-CM | POA: Diagnosis not present

## 2022-06-14 DIAGNOSIS — E039 Hypothyroidism, unspecified: Secondary | ICD-10-CM

## 2022-06-14 DIAGNOSIS — J302 Other seasonal allergic rhinitis: Secondary | ICD-10-CM | POA: Diagnosis not present

## 2022-06-14 DIAGNOSIS — B354 Tinea corporis: Secondary | ICD-10-CM | POA: Diagnosis not present

## 2022-06-14 DIAGNOSIS — E559 Vitamin D deficiency, unspecified: Secondary | ICD-10-CM | POA: Diagnosis not present

## 2022-06-14 DIAGNOSIS — Z0000 Encounter for general adult medical examination without abnormal findings: Secondary | ICD-10-CM

## 2022-06-14 DIAGNOSIS — Z6841 Body Mass Index (BMI) 40.0 and over, adult: Secondary | ICD-10-CM

## 2022-06-14 LAB — CBC WITH DIFFERENTIAL/PLATELET
Basophils Absolute: 0.1 10*3/uL (ref 0.0–0.1)
Basophils Relative: 1.3 % (ref 0.0–3.0)
Eosinophils Absolute: 0.1 10*3/uL (ref 0.0–0.7)
Eosinophils Relative: 3.6 % (ref 0.0–5.0)
HCT: 36.7 % (ref 36.0–46.0)
Hemoglobin: 12.5 g/dL (ref 12.0–15.0)
Lymphocytes Relative: 31.2 % (ref 12.0–46.0)
Lymphs Abs: 1.2 10*3/uL (ref 0.7–4.0)
MCHC: 33.9 g/dL (ref 30.0–36.0)
MCV: 86 fl (ref 78.0–100.0)
Monocytes Absolute: 0.3 10*3/uL (ref 0.1–1.0)
Monocytes Relative: 7.7 % (ref 3.0–12.0)
Neutro Abs: 2.2 10*3/uL (ref 1.4–7.7)
Neutrophils Relative %: 56.2 % (ref 43.0–77.0)
Platelets: 284 10*3/uL (ref 150.0–400.0)
RBC: 4.27 Mil/uL (ref 3.87–5.11)
RDW: 13.8 % (ref 11.5–15.5)
WBC: 3.9 10*3/uL — ABNORMAL LOW (ref 4.0–10.5)

## 2022-06-14 LAB — VITAMIN D 25 HYDROXY (VIT D DEFICIENCY, FRACTURES): VITD: 44.79 ng/mL (ref 30.00–100.00)

## 2022-06-14 LAB — COMPREHENSIVE METABOLIC PANEL
ALT: 12 U/L (ref 0–35)
AST: 13 U/L (ref 0–37)
Albumin: 3.5 g/dL (ref 3.5–5.2)
Alkaline Phosphatase: 59 U/L (ref 39–117)
BUN: 17 mg/dL (ref 6–23)
CO2: 29 mEq/L (ref 19–32)
Calcium: 9.4 mg/dL (ref 8.4–10.5)
Chloride: 103 mEq/L (ref 96–112)
Creatinine, Ser: 1.02 mg/dL (ref 0.40–1.20)
GFR: 59.4 mL/min — ABNORMAL LOW (ref >60.00)
Glucose, Bld: 75 mg/dL (ref 70–99)
Potassium: 4.1 mEq/L (ref 3.5–5.1)
Sodium: 140 mEq/L (ref 135–145)
Total Bilirubin: 0.7 mg/dL (ref 0.2–1.2)
Total Protein: 6.7 g/dL (ref 6.0–8.3)

## 2022-06-14 LAB — LIPID PANEL
Cholesterol: 191 mg/dL (ref 0–200)
HDL: 67.4 mg/dL (ref >39.00)
LDL Cholesterol: 107 mg/dL — ABNORMAL HIGH (ref 0–99)
NonHDL: 123.12
Total CHOL/HDL Ratio: 3
Triglycerides: 83 mg/dL (ref 0.0–149.0)
VLDL: 16.6 mg/dL (ref 0.0–40.0)

## 2022-06-14 MED ORDER — NAFTIFINE HCL 1 % EX CREA: TOPICAL_CREAM | Freq: Every day | CUTANEOUS | 2 refills | Status: DC

## 2022-06-14 MED ORDER — LEVOTHYROXINE SODIUM 50 MCG PO TABS: 50.0000 ug | ORAL_TABLET | Freq: Every day | ORAL | 3 refills | Status: DC

## 2022-06-14 MED ORDER — FLUTICASONE PROPIONATE 50 MCG/ACT NA SUSP: NASAL | 2 refills | Status: DC

## 2022-06-14 NOTE — Assessment & Plan Note (Signed)
Tolerating statin, encouraged heart healthy diet, avoid trans fats, minimize simple carbs and saturated fats. Increase exercise as tolerated 

## 2022-06-14 NOTE — Progress Notes (Signed)
Subjective:   By signing my name below, I, [name], attest that this documentation has been prepared under the direction and in the presence of [name], DO 06/14/22   Patient ID: [name], female    DOB: 1960/07/04, 62 y.o.   MRN: [id number]  Chief Complaint  Patient presents with   Annual Exam    Pt states fasting     HPI Patient is in today for a comprehensive physical exam.  She is compliant with her antihypertensives.   She reports joint pain in her right knee. She states pain worsens when she walks up the stairs.   She reports hair loss in the back of her head. She believes this is caused by her thyroid medication.   She endorses soreness and swelling on the bridge of her nose which she attributes to her allergies. She has been using Flonase.   She has been walking at home in her hallway for about 30 minutes everyday 3 times a day. She has not been attending [facility].   She reports that she has changed her eating habits. She has increased her portion sizes and tends to avoid sugar. She uses Splenda for her coffee.   Last colonoscopy: 08/03/2019. Polyps found and removed. Diverticulosis found. Otherwise normal.   Last mammogram: 12/29/2021. Radioactive seed localization right breast.   She states she got Covid in 02/2022 so has not received the vaccine yet. She plans to get the vaccine in a couple of weeks.   She is UTD on routine vision and dental exams.   She denies having any fever, new muscle pain, new moles, congestion, sinus pain, sore throat, chest pain, palpitations, cough, SOB, wheezing, n/v/d, constipation, blood in stool, dysuria, frequency, hematuria, or headaches at this time.  Past Medical History:  Diagnosis Date   Abdominal pain, other specified site    Acute upper respiratory infections of unspecified site    Allergy    seasonal   Anemia    Benign neoplasm of skin, site unspecified    Disturbance of skin  sensation    Diverticulosis of colon (without mention of hemorrhage)    Family history of diabetes mellitus    Family history of ischemic heart disease    Family history of malignant neoplasm of genital organ, other    GERD (gastroesophageal reflux disease)    Hypertension    Hypokalemia    Left shoulder pain    Low blood potassium    Post-menopausal    Rheumatoid arthritis ([place])    Sleep apnea    wears cpap    Sprain and strain of unspecified site of knee and leg    Sprain of neck    Thyroid disease    Unspecified essential hypertension     Past Surgical History:  Procedure Laterality Date   ABDOMINAL HYSTERECTOMY     BREAST BIOPSY Right 10/30/2021   BREAST LUMPECTOMY WITH RADIOACTIVE SEED LOCALIZATION Right 12/30/2021   Procedure: RIGHT BREAST LUMPECTOMY WITH RADIOACTIVE SEED LOCALIZATION;  Surgeon: Erroll Luna, MD;  Location: [facility];  Service: General;  Laterality: Right;   BUNIONECTOMY  2005   right foot   COLONOSCOPY  6-12/2008   SHOULDER SURGERY     left;spurs 2-09- dr Rhoderick Moody   TOTAL ABDOMINAL HYSTERECTOMY W/ BILATERAL SALPINGOOPHORECTOMY  2002    Family History  Problem Relation Age of Onset   Diabetes Mother    Hypertension Mother    Dementia Mother  Stroke Mother    Prostate cancer Father    Hypertension Father    Cancer Father    Hypertension Sister    Diabetes Brother    Stroke Maternal Aunt    Breast cancer Maternal Aunt    Breast cancer Cousin    Colon cancer Neg Hx    Colon polyps Neg Hx    Esophageal cancer Neg Hx    Rectal cancer Neg Hx    Stomach cancer Neg Hx     Social History   Socioeconomic History   Marital status: Married    Spouse name: Not on file   Number of children: 0   Years of education: Not on file   Highest education level: Not on file  Occupational History   Occupation: Health visitor CLERK    Employer: [employer]   Occupation: retired    Comment: 03/18/2021  Tobacco Use   Smoking status: Never    Smokeless tobacco: Never  Substance and Sexual Activity   Alcohol use: No   Drug use: No   Sexual activity: Yes    Partners: Male  Other Topics Concern   Not on file  Social History Narrative   Exercise -- walking 15 min x 3 a day    Social Determinants of Health   Financial Resource Strain: Not on file  Food Insecurity: Not on file  Transportation Needs: Not on file  Physical Activity: Not on file  Stress: Not on file  Social Connections: Not on file  Intimate Partner Violence: Not on file    Outpatient Medications Prior to Visit  Medication Sig Dispense Refill   amLODipine (NORVASC) 10 MG tablet Take 1 tablet (10 mg total) by mouth daily. 90 tablet 1   Cholecalciferol (VITAMIN D) 50 MCG (2000 UT) CAPS Take 1 capsule (2,000 Units total) by mouth daily. 30 capsule 0   estradiol (ESTRACE) 2 MG tablet Take 1 mg by mouth as needed.     furosemide (LASIX) 40 MG tablet 1 every 3 days if needed. 30 tablet 0   hydrochlorothiazide (HYDRODIURIL) 25 MG tablet TAKE ONE TABLET BY MOUTH DAILY 30 tablet 2   hydroxychloroquine (PLAQUENIL) 200 MG tablet Take 200 mg by mouth 2 (two) times daily.     ibuprofen (ADVIL) 800 MG tablet Take 1 tablet (800 mg total) by mouth every 8 (eight) hours as needed. 30 tablet 0   losartan (COZAAR) 100 MG tablet TAKE ONE TABLET BY MOUTH DAILY 30 tablet 3   naproxen sodium (ALEVE) 220 MG tablet Take 220 mg by mouth.     omeprazole (PRILOSEC) 20 MG capsule TAKE ONE CAPSULE BY MOUTH DAILY 90 capsule 1   oxyCODONE (OXY IR/ROXICODONE) 5 MG immediate release tablet Take 1 tablet (5 mg total) by mouth every 6 (six) hours as needed for severe pain. 15 tablet 0   potassium chloride (KLOR-CON M) 10 MEQ tablet TAKE TWO TABLETS BY MOUTH TWICE A DAY 120 tablet 5   RESTASIS 0.05 % ophthalmic emulsion Place 1 drop into both eyes daily.     fluticasone (FLONASE) 50 MCG/ACT nasal spray INSTILL TWO SPRAYS INTO BOTH NOSTRIL DAILY. 16 g 2   metFORMIN (GLUCOPHAGE-XR) 500 MG 24 hr  tablet Take 1 tablet (500 mg total) by mouth daily with breakfast. 90 tablet 0   naftifine (NAFTIN) 1 % cream Apply topically daily. 60 g 2   No facility-administered medications prior to visit.    No Known Allergies  Review of Systems  Constitutional:  Negative for  fever and malaise/fatigue.       (+) hair loss (posterior)  HENT:  Negative for congestion.   Eyes:  Negative for blurred vision.  Respiratory:  Negative for shortness of breath.   Cardiovascular:  Negative for chest pain, palpitations and leg swelling.  Gastrointestinal:  Negative for abdominal pain, blood in stool and nausea.  Genitourinary:  Negative for dysuria and frequency.  Musculoskeletal:  Positive for joint pain (right knee pain). Negative for falls.  Skin:  Negative for rash.  Neurological:  Negative for dizziness, loss of consciousness and headaches.  Endo/Heme/Allergies:  Negative for environmental allergies.  Psychiatric/Behavioral:  Negative for depression. The patient is not nervous/anxious.        Objective:    Physical Exam Vitals and nursing note reviewed.  Constitutional:      General: She is not in acute distress.    Appearance: Normal appearance.  HENT:     Head: Normocephalic and atraumatic.     Right Ear: External ear normal.     Left Ear: External ear normal.  Eyes:     Extraocular Movements: Extraocular movements intact.     Pupils: Pupils are equal, round, and reactive to light.  Cardiovascular:     Rate and Rhythm: Normal rate and regular rhythm.     Pulses: Normal pulses.     Heart sounds: Normal heart sounds. No murmur heard.    No gallop.  Pulmonary:     Effort: Pulmonary effort is normal. No respiratory distress.     Breath sounds: Normal breath sounds. No wheezing.  Musculoskeletal:        General: Tenderness present.     Cervical back: Normal range of motion.     Right knee: Crepitus (with extension) present.     Comments: No pain with palpations, right knee crepitus  with extension.   Skin:    General: Skin is warm and dry.  Neurological:     General: No focal deficit present.     Mental Status: She is alert and oriented to person, place, and time.  Psychiatric:        Judgment: Judgment normal.     BP 132/82 (BP Location: Left Arm, Patient Position: Sitting, Cuff Size: Large)   Pulse 78   Temp 97.6 F (36.4 C) (Oral)   Resp 18   Ht '5\' 8"'$  (1.727 m)   Wt (!) 305 lb (138.3 kg)   SpO2 98%   BMI 46.38 kg/m  Wt Readings from Last 3 Encounters:  06/14/22 (!) 305 lb (138.3 kg)  02/25/22 (!) 311 lb (141.1 kg)  12/30/21 (!) 311 lb 1.1 oz (141.1 kg)       Assessment & Plan:  Preventative health care -     CBC with Differential/Platelet -     Comprehensive metabolic panel -     Lipid panel -     Insulin, random -     VITAMIN D 25 Hydroxy (Vit-D Deficiency, Fractures)  Tinea corporis -     Naftifine HCl; Apply topically daily.  Dispense: 60 g; Refill: 2  Seasonal allergies -     Fluticasone Propionate; INSTILL TWO SPRAYS INTO BOTH NOSTRIL DAILY.  Dispense: 16 g; Refill: 2  Morbid obesity (HCC) -     CBC with Differential/Platelet -     Comprehensive metabolic panel -     Lipid panel -     Insulin, random -     VITAMIN D 25 Hydroxy (Vit-D Deficiency, Fractures)  Hyperlipidemia, unspecified hyperlipidemia type  Assessment & Plan: Tolerating statin, encouraged heart healthy diet, avoid trans fats, minimize simple carbs and saturated fats. Increase exercise as tolerated   Orders: -     Comprehensive metabolic panel -     Lipid panel  Essential hypertension Assessment & Plan: Well controlled, no changes to meds. Encouraged heart healthy diet such as the DASH diet and exercise as tolerated.    Orders: -     CBC with Differential/Platelet -     Comprehensive metabolic panel -     Lipid panel  Acquired hypothyroidism Assessment & Plan: Check labs  Pt was out of synthroid for a while but recently got a refill She has been on it  over 2 months now   Orders: -     Thyroid Panel With TSH -     Levothyroxine Sodium; Take 1 tablet (50 mcg total) by mouth daily.  Dispense: 90 tablet; Refill: 3  Vitamin D deficiency -     VITAMIN D 25 Hydroxy (Vit-D Deficiency, Fractures)  SLE (systemic lupus erythematosus related syndrome) (HCC) Assessment & Plan: Per rheum   Class 3 severe obesity due to excess calories with serious comorbidity and body mass index (BMI) of 45.0 to 49.9 in adult [facility]) Assessment & Plan: D/w pt diet and exercise    OSA on CPAP  Insulin resistance Assessment & Plan: Check labs      I,Rachel Rivera,acting as a scribe for [name], DO.,have documented all relevant documentation on the behalf of [name], DO,as directed by  [name], DO while in the presence of [place], DO, personally preformed the services described in this documentation.  All medical record entries made by the scribe were at my direction and in my presence.  I have reviewed the chart and discharge instructions (if applicable) and agree that the record reflects my personal performance and is accurate and complete. 06/14/22   [name], DO

## 2022-06-14 NOTE — Assessment & Plan Note (Signed)
Well controlled, no changes to meds. Encouraged heart healthy diet such as the DASH diet and exercise as tolerated.  °

## 2022-06-14 NOTE — Patient Instructions (Signed)
Preventive Care 40-62 Years Old, Female Preventive care refers to lifestyle choices and visits with your health care provider that can promote health and wellness. Preventive care visits are also called wellness exams. What can I expect for my preventive care visit? Counseling Your health care provider may ask you questions about your: Medical history, including: Past medical problems. Family medical history. Pregnancy history. Current health, including: Menstrual cycle. Method of birth control. Emotional well-being. Home life and relationship well-being. Sexual activity and sexual health. Lifestyle, including: Alcohol, nicotine or tobacco, and drug use. Access to firearms. Diet, exercise, and sleep habits. Work and work environment. Sunscreen use. Safety issues such as seatbelt and bike helmet use. Physical exam Your health care provider will check your: Height and weight. These may be used to calculate your BMI (body mass index). BMI is a measurement that tells if you are at a healthy weight. Waist circumference. This measures the distance around your waistline. This measurement also tells if you are at a healthy weight and may help predict your risk of certain diseases, such as type 2 diabetes and high blood pressure. Heart rate and blood pressure. Body temperature. Skin for abnormal spots. What immunizations do I need?  Vaccines are usually given at various ages, according to a schedule. Your health care provider will recommend vaccines for you based on your age, medical history, and lifestyle or other factors, such as travel or where you work. What tests do I need? Screening Your health care provider may recommend screening tests for certain conditions. This may include: Lipid and cholesterol levels. Diabetes screening. This is done by checking your blood sugar (glucose) after you have not eaten for a while (fasting). Pelvic exam and Pap test. Hepatitis B test. Hepatitis C  test. HIV (human immunodeficiency virus) test. STI (sexually transmitted infection) testing, if you are at risk. Lung cancer screening. Colorectal cancer screening. Mammogram. Talk with your health care provider about when you should start having regular mammograms. This may depend on whether you have a family history of breast cancer. BRCA-related cancer screening. This may be done if you have a family history of breast, ovarian, tubal, or peritoneal cancers. Bone density scan. This is done to screen for osteoporosis. Talk with your health care provider about your test results, treatment options, and if necessary, the need for more tests. Follow these instructions at home: Eating and drinking  Eat a diet that includes fresh fruits and vegetables, whole grains, lean protein, and low-fat dairy products. Take vitamin and mineral supplements as recommended by your health care provider. Do not drink alcohol if: Your health care provider tells you not to drink. You are pregnant, may be pregnant, or are planning to become pregnant. If you drink alcohol: Limit how much you have to 0-1 drink a day. Know how much alcohol is in your drink. In the [place] one drink equals one 12 oz bottle of beer (355 mL), one 5 oz glass of wine (148 mL), or one 1 oz glass of hard liquor (44 mL). Lifestyle Brush your teeth every morning and night with fluoride toothpaste. Floss one time each day. Exercise for at least 30 minutes 5 or more days each week. Do not use any products that contain nicotine or tobacco. These products include cigarettes, chewing tobacco, and vaping devices, such as e-cigarettes. If you need help quitting, ask your health care provider. Do not use drugs. If you are sexually active, practice safe sex. Use a condom or other form of protection to   prevent STIs. If you do not wish to become pregnant, use a form of birth control. If you plan to become pregnant, see your health care provider for a  prepregnancy visit. Take aspirin only as told by your health care provider. Make sure that you understand how much to take and what form to take. Work with your health care provider to find out whether it is safe and beneficial for you to take aspirin daily. Find healthy ways to manage stress, such as: Meditation, yoga, or listening to music. Journaling. Talking to a trusted person. Spending time with friends and family. Minimize exposure to UV radiation to reduce your risk of skin cancer. Safety Always wear your seat belt while driving or riding in a vehicle. Do not drive: If you have been drinking alcohol. Do not ride with someone who has been drinking. When you are tired or distracted. While texting. If you have been using any mind-altering substances or drugs. Wear a helmet and other protective equipment during sports activities. If you have firearms in your house, make sure you follow all gun safety procedures. Seek help if you have been physically or sexually abused. What's next? Visit your health care provider once a year for an annual wellness visit. Ask your health care provider how often you should have your eyes and teeth checked. Stay up to date on all vaccines. This information is not intended to replace advice given to you by your health care provider. Make sure you discuss any questions you have with your health care provider. Document Revised: 10/01/2020 Document Reviewed: 10/01/2020 Elsevier Patient Education  2023 [employer]vier Inc.  

## 2022-06-14 NOTE — Assessment & Plan Note (Signed)
D/w pt diet and exercise  

## 2022-06-14 NOTE — Assessment & Plan Note (Signed)
Check labs 

## 2022-06-14 NOTE — Assessment & Plan Note (Signed)
Check labs  Pt was out of synthroid for a while but recently got a refill She has been on it over 2 months now

## 2022-06-14 NOTE — Assessment & Plan Note (Signed)
Per rheum

## 2022-06-15 LAB — THYROID PANEL WITH TSH
Free Thyroxine Index: 2.4 (ref 1.4–3.8)
T3 Uptake: 24 % (ref 22–35)
T4, Total: 10.1 ug/dL (ref 5.1–11.9)
TSH: 2.88 mIU/L (ref 0.40–4.50)

## 2022-06-15 LAB — INSULIN, RANDOM: Insulin: 6.4 u[IU]/mL

## 2022-07-12 ENCOUNTER — Other Ambulatory Visit: Payer: Self-pay | Admitting: Family Medicine

## 2022-07-12 DIAGNOSIS — R1013 Epigastric pain: Secondary | ICD-10-CM

## 2022-08-18 ENCOUNTER — Other Ambulatory Visit: Payer: Self-pay | Admitting: Family Medicine

## 2022-11-17 ENCOUNTER — Encounter (INDEPENDENT_AMBULATORY_CARE_PROVIDER_SITE_OTHER): Payer: Self-pay

## 2022-12-13 ENCOUNTER — Ambulatory Visit: Payer: 59 | Admitting: Family Medicine

## 2023-01-04 ENCOUNTER — Other Ambulatory Visit: Payer: Self-pay | Admitting: Family Medicine

## 2023-01-04 DIAGNOSIS — I10 Essential (primary) hypertension: Secondary | ICD-10-CM

## 2023-04-04 ENCOUNTER — Ambulatory Visit: Payer: 59 | Admitting: Family Medicine

## 2023-04-04 ENCOUNTER — Encounter: Payer: Self-pay | Admitting: Family Medicine

## 2023-04-04 ENCOUNTER — Other Ambulatory Visit (HOSPITAL_BASED_OUTPATIENT_CLINIC_OR_DEPARTMENT_OTHER): Payer: Self-pay

## 2023-04-04 DIAGNOSIS — E039 Hypothyroidism, unspecified: Secondary | ICD-10-CM

## 2023-04-04 DIAGNOSIS — Z23 Encounter for immunization: Secondary | ICD-10-CM | POA: Diagnosis not present

## 2023-04-04 DIAGNOSIS — E876 Hypokalemia: Secondary | ICD-10-CM

## 2023-04-04 DIAGNOSIS — R1013 Epigastric pain: Secondary | ICD-10-CM | POA: Diagnosis not present

## 2023-04-04 DIAGNOSIS — E785 Hyperlipidemia, unspecified: Secondary | ICD-10-CM

## 2023-04-04 DIAGNOSIS — E559 Vitamin D deficiency, unspecified: Secondary | ICD-10-CM | POA: Diagnosis not present

## 2023-04-04 DIAGNOSIS — I10 Essential (primary) hypertension: Secondary | ICD-10-CM

## 2023-04-04 DIAGNOSIS — G4452 New daily persistent headache (NDPH): Secondary | ICD-10-CM

## 2023-04-04 LAB — CBC WITH DIFFERENTIAL/PLATELET
Basophils Absolute: 0.1 10*3/uL (ref 0.0–0.1)
Basophils Relative: 2.7 % (ref 0.0–3.0)
Eosinophils Absolute: 0.2 10*3/uL (ref 0.0–0.7)
Eosinophils Relative: 4.4 % (ref 0.0–5.0)
HCT: 36.7 % (ref 36.0–46.0)
Hemoglobin: 12.1 g/dL (ref 12.0–15.0)
Lymphocytes Relative: 36.2 % (ref 12.0–46.0)
Lymphs Abs: 1.2 10*3/uL (ref 0.7–4.0)
MCHC: 33 g/dL (ref 30.0–36.0)
MCV: 88.2 fL (ref 78.0–100.0)
Monocytes Absolute: 0.2 10*3/uL (ref 0.1–1.0)
Monocytes Relative: 7.1 % (ref 3.0–12.0)
Neutro Abs: 1.7 10*3/uL (ref 1.4–7.7)
Neutrophils Relative %: 49.6 % (ref 43.0–77.0)
Platelets: 269 10*3/uL (ref 150.0–400.0)
RBC: 4.16 Mil/uL (ref 3.87–5.11)
RDW: 14.2 % (ref 11.5–15.5)
WBC: 3.4 10*3/uL — ABNORMAL LOW (ref 4.0–10.5)

## 2023-04-04 LAB — LIPID PANEL
Cholesterol: 175 mg/dL (ref 0–200)
HDL: 63.4 mg/dL (ref >39.00)
LDL Cholesterol: 95 mg/dL (ref 0–99)
NonHDL: 111.28
Total CHOL/HDL Ratio: 3
Triglycerides: 80 mg/dL (ref 0.0–149.0)
VLDL: 16 mg/dL (ref 0.0–40.0)

## 2023-04-04 LAB — COMPREHENSIVE METABOLIC PANEL
ALT: 11 U/L (ref 0–35)
AST: 13 U/L (ref 0–37)
Albumin: 3.6 g/dL (ref 3.5–5.2)
Alkaline Phosphatase: 60 U/L (ref 39–117)
BUN: 14 mg/dL (ref 6–23)
CO2: 28 meq/L (ref 19–32)
Calcium: 8.5 mg/dL (ref 8.4–10.5)
Chloride: 108 meq/L (ref 96–112)
Creatinine, Ser: 0.96 mg/dL (ref 0.40–1.20)
GFR: 63.53 mL/min (ref >60.00)
Glucose, Bld: 83 mg/dL (ref 70–99)
Potassium: 3.9 meq/L (ref 3.5–5.1)
Sodium: 143 meq/L (ref 135–145)
Total Bilirubin: 0.6 mg/dL (ref 0.2–1.2)
Total Protein: 6.3 g/dL (ref 6.0–8.3)

## 2023-04-04 LAB — TSH: TSH: 2.42 u[IU]/mL (ref 0.35–5.50)

## 2023-04-04 LAB — SEDIMENTATION RATE: Sed Rate: 33 mm/h — ABNORMAL HIGH (ref 0–30)

## 2023-04-04 LAB — HEMOGLOBIN A1C: Hgb A1c MFr Bld: 5.1 % (ref 4.6–6.5)

## 2023-04-04 MED ORDER — AMLODIPINE BESYLATE 10 MG PO TABS: 10.0000 mg | ORAL_TABLET | Freq: Every day | ORAL | 3 refills | Status: DC

## 2023-04-04 MED ORDER — POTASSIUM CHLORIDE CRYS ER 10 MEQ PO TBCR: 20.0000 meq | EXTENDED_RELEASE_TABLET | Freq: Two times a day (BID) | ORAL | 5 refills | Status: DC

## 2023-04-04 MED ORDER — COVID-19 MRNA VAC-TRIS(PFIZER) 30 MCG/0.3ML IM SUSY
0.3000 mL | PREFILLED_SYRINGE | Freq: Once | INTRAMUSCULAR | 0 refills | Status: AC
  Filled 2023-04-04: qty 0.3, 1d supply, fill #0

## 2023-04-04 MED ORDER — HYDROCHLOROTHIAZIDE 25 MG PO TABS: 25.0000 mg | ORAL_TABLET | Freq: Every day | ORAL | 3 refills | Status: DC

## 2023-04-04 MED ORDER — OMEPRAZOLE 20 MG PO CPDR: 20.0000 mg | DELAYED_RELEASE_CAPSULE | Freq: Every day | ORAL | 1 refills | Status: DC

## 2023-04-04 MED ORDER — LOSARTAN POTASSIUM 100 MG PO TABS: 100.0000 mg | ORAL_TABLET | Freq: Every day | ORAL | 3 refills | Status: DC

## 2023-04-04 NOTE — Progress Notes (Signed)
Established Patient Office Visit  Subjective   Patient ID: [name], female    DOB: 11-24-1960  Age: 62 y.o. MRN: [id number]  Chief Complaint  Patient presents with   Hypertension   Hypothyroidism   Follow-up    HPI Discussed the use of AI scribe software for clinical note transcription with the patient, who gave verbal consent to proceed.  History of Present Illness   The patient, employed at a call center, presents with a one-week history of headaches localized to the left temple. The headaches are associated with recent sinus symptoms, including sneezing and nasal congestion. The patient has been managing the headaches with nightly Tylenol.  In addition to the headaches, the patient reports swelling in the right leg, which has been causing pain and difficulty walking up stairs. The swelling has been persistent and is associated with an increase in weight to 300 pounds. The patient has been taking Lasix as needed for the swelling.  The patient also mentions a history of hypertension, for which she is on medication. However, she does not monitor her blood pressure at home. She has not reported any new medications or changes in her current regimen.  The patient is also under treatment for a knee condition, but has not seen her orthopedic doctor since the onset of the leg swelling. She has not reported any shortness of breath or other respiratory symptoms.  The patient's work schedule includes working on holidays, which she suggests may be contributing to her stress levels. She has not reported any other new or worsening symptoms.      Patient Active Problem List   Diagnosis Date Noted   Preventative health care 06/09/2021   Peripheral edema 02/27/2020   OSA on CPAP 09/19/2019   Acute non-recurrent pansinusitis 04/26/2019   Counseled about COVID-19 virus infection 04/26/2019   Insulin resistance 03/07/2019   Vitamin D deficiency 03/07/2019   Diverticulosis 03/07/2019    Obesity with alveolar hypoventilation and body mass 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 (HCC) 03/01/2019   Excessive daytime sleepiness 03/01/2019   Class 3 severe obesity with serious comorbidity and body mass index (BMI) of 45.0 to 49.9 in adult [facility]) 03/01/2019   Gastroesophageal reflux disease without esophagitis 03/01/2019   Hypothyroidism 06/15/2018   SLE (systemic lupus erythematosus related syndrome) (HCC), followed by Rheumatology, Rx Plaquenil 10/13/2017   Hyperlipidemia 01/20/2012   Osteoarthritis 04/23/2009   Essential hypertension 05/18/2006   Past Medical History:  Diagnosis Date   Abdominal pain, other specified site    Acute upper respiratory infections of unspecified site    Allergy    seasonal   Anemia    Benign neoplasm of skin, site unspecified    Disturbance of skin sensation    Diverticulosis of colon (without mention of hemorrhage)    Family history of diabetes mellitus    Family history of ischemic heart disease    Family history of malignant neoplasm of genital organ, other    GERD (gastroesophageal reflux disease)    Hypertension    Hypokalemia    Left shoulder pain    Low blood potassium    Post-menopausal    Rheumatoid arthritis (HCC)    Sleep apnea    wears cpap    Sprain and strain of unspecified site of knee and leg    Sprain of neck    Thyroid disease    Unspecified essential hypertension    Past Surgical History:  Procedure Laterality Date   ABDOMINAL HYSTERECTOMY  BREAST BIOPSY Right 10/30/2021   BREAST LUMPECTOMY WITH RADIOACTIVE SEED LOCALIZATION Right 12/30/2021   Procedure: RIGHT BREAST LUMPECTOMY WITH RADIOACTIVE SEED LOCALIZATION;  Surgeon: Harriette Bouillon, MD;  Location: [facility];  Service: General;  Laterality: Right;   BUNIONECTOMY  2005   right foot   COLONOSCOPY  6-12/2008   SHOULDER SURGERY     left;spurs 2-09- dr Rolene Arbour   TOTAL ABDOMINAL HYSTERECTOMY W/ BILATERAL SALPINGOOPHORECTOMY  2002   Social History    Tobacco Use   Smoking status: Never   Smokeless tobacco: Never  Substance Use Topics   Alcohol use: No   Drug use: No   Social History   Socioeconomic History   Marital status: Married    Spouse name: Not on file   Number of children: 0   Years of education: Not on file   Highest education level: Not on file  Occupational History   Occupation: Forensic scientist CLERK    Employer: [employer]   Occupation: retired    Comment: 03/18/2021  Tobacco Use   Smoking status: Never   Smokeless tobacco: Never  Substance and Sexual Activity   Alcohol use: No   Drug use: No   Sexual activity: Yes    Partners: Male  Other Topics Concern   Not on file  Social History Narrative   Exercise -- walking 15 min x 3 a day    Social Drivers of Corporate investment banker Strain: Not on file  Food Insecurity: Not on file  Transportation Needs: Not on file  Physical Activity: Not on file  Stress: Not on file  Social Connections: Not on file  Intimate Partner Violence: Not on file   Family Status  Relation Name Status   Mother  Alive   Father  Deceased   Sister  (Not Specified)   Brother  (Not Specified)   Youth worker  (Not Specified)   Cousin  (Not Specified)   Neg Hx  (Not Specified)  No partnership data on file   Family History  Problem Relation Age of Onset   Diabetes Mother    Hypertension Mother    Dementia Mother    Stroke Mother    Prostate cancer Father    Hypertension Father    Cancer Father    Hypertension Sister    Diabetes Brother    Stroke Maternal Aunt    Breast cancer Maternal Aunt    Breast cancer Cousin    Colon cancer Neg Hx    Colon polyps Neg Hx    Esophageal cancer Neg Hx    Rectal cancer Neg Hx    Stomach cancer Neg Hx    No Known Allergies    Review of Systems  Constitutional:  Negative for fever and malaise/fatigue.  HENT:  Negative for congestion.   Eyes:  Negative for blurred vision.  Respiratory:  Negative for cough and shortness of  breath.   Cardiovascular:  Negative for chest pain, palpitations and leg swelling.  Gastrointestinal:  Negative for vomiting.  Musculoskeletal:  Negative for back pain.  Skin:  Negative for rash.  Neurological:  Negative for loss of consciousness and headaches.      Objective:     BP 124/88 (BP Location: Left Arm, Patient Position: Sitting, Cuff Size: Large)   Pulse 78   Temp 97.7 F (36.5 C) (Oral)   Resp 20   Ht 5\' 8"  (1.727 m)   Wt (!) 310 lb (140.6 kg)   SpO2 97%  BMI 47.14 kg/m  BP Readings from Last 3 Encounters:  04/04/23 124/88  06/14/22 132/82  02/25/22 (!) 83/54   Wt Readings from Last 3 Encounters:  04/04/23 (!) 310 lb (140.6 kg)  06/14/22 (!) 305 lb (138.3 kg)  02/25/22 (!) 311 lb (141.1 kg)   SpO2 Readings from Last 3 Encounters:  04/04/23 97%  06/14/22 98%  12/30/21 98%      Physical Exam Vitals and nursing note reviewed.  Constitutional:      General: She is not in acute distress.    Appearance: Normal appearance. She is well-developed.  HENT:     Head: Normocephalic and atraumatic.     Right Ear: Tympanic membrane, ear canal and external ear normal. There is no impacted cerumen.     Left Ear: Tympanic membrane, ear canal and external ear normal. There is no impacted cerumen.     Nose: Nose normal.     Mouth/Throat:     Mouth: Mucous membranes are moist.     Pharynx: Oropharynx is clear. No oropharyngeal exudate or posterior oropharyngeal erythema.  Eyes:     General: No scleral icterus.       Right eye: No discharge.        Left eye: No discharge.     Conjunctiva/sclera: Conjunctivae normal.     Pupils: Pupils are equal, round, and reactive to light.  Neck:     Thyroid: No thyromegaly or thyroid tenderness.     Vascular: No JVD.  Cardiovascular:     Rate and Rhythm: Normal rate and regular rhythm.     Heart sounds: Normal heart sounds. No murmur heard. Pulmonary:     Effort: Pulmonary effort is normal. No respiratory distress.      Breath sounds: Normal breath sounds.  Abdominal:     General: Bowel sounds are normal. There is no distension.     Palpations: Abdomen is soft. There is no mass.     Tenderness: There is no abdominal tenderness. There is no guarding or rebound.  Genitourinary:    Vagina: Normal.  Musculoskeletal:        General: Swelling present. Normal range of motion.     Cervical back: Normal range of motion and neck supple.     Right lower leg: Pitting Edema present.     Left lower leg: No edema.  Lymphadenopathy:     Cervical: No cervical adenopathy.  Skin:    General: Skin is warm and dry.     Findings: No erythema or rash.  Neurological:     Mental Status: She is alert and oriented to person, place, and time.     Cranial Nerves: No cranial nerve deficit.     Deep Tendon Reflexes: Reflexes are normal and symmetric.  Psychiatric:        Mood and Affect: Mood normal.        Behavior: Behavior normal.        Thought Content: Thought content normal.        Judgment: Judgment normal.      No results found for any visits on 04/04/23.  Last CBC Lab Results  Component Value Date   WBC 3.9 (L) 06/14/2022   HGB 12.5 06/14/2022   HCT 36.7 06/14/2022   MCV 86.0 06/14/2022   MCH 28.7 02/21/2019   RDW 13.8 06/14/2022   PLT 284.0 06/14/2022   Last metabolic panel Lab Results  Component Value Date   GLUCOSE 75 06/14/2022   NA 140 06/14/2022   K  4.1 06/14/2022   CL 103 06/14/2022   CO2 29 06/14/2022   BUN 17 06/14/2022   CREATININE 1.02 06/14/2022   GFR 59.40 (L) 06/14/2022   CALCIUM 9.4 06/14/2022   PROT 6.7 06/14/2022   ALBUMIN 3.5 06/14/2022   LABGLOB 2.4 07/12/2019   AGRATIO 1.6 07/12/2019   BILITOT 0.7 06/14/2022   ALKPHOS 59 06/14/2022   AST 13 06/14/2022   ALT 12 06/14/2022   Last lipids Lab Results  Component Value Date   CHOL 191 06/14/2022   HDL 67.40 06/14/2022   LDLCALC 107 (H) 06/14/2022   LDLDIRECT 128.5 04/30/2008   TRIG 83.0 06/14/2022   CHOLHDL 3  06/14/2022   Last hemoglobin A1c Lab Results  Component Value Date   HGBA1C 5.1 12/07/2021   Last thyroid functions Lab Results  Component Value Date   TSH 2.88 06/14/2022   T3TOTAL 110 07/12/2019   T4TOTAL 10.1 06/14/2022   Last vitamin D Lab Results  Component Value Date   VD25OH 44.79 06/14/2022   Last vitamin B12 and Folate Lab Results  Component Value Date   VITAMINB12 673 02/21/2019      The 10-year ASCVD risk score (Arnett DK, et al., 2019) is: 6.2%    Assessment & Plan:   Problem List Items Addressed This Visit       Unprioritized   Vitamin D deficiency   Relevant Orders   VITAMIN D 25 Hydroxy (Vit-D Deficiency, Fractures)   Hemoglobin A1c   Hypothyroidism   Relevant Orders   TSH   Hyperlipidemia   Encourage heart healthy diet such as MIND or DASH diet, increase exercise, avoid trans fats, simple carbohydrates and processed foods, consider a krill or fish or flaxseed oil cap daily.        Relevant Medications   amLODipine (NORVASC) 10 MG tablet   hydrochlorothiazide (HYDRODIURIL) 25 MG tablet   losartan (COZAAR) 100 MG tablet   Other Relevant Orders   CBC with Differential/Platelet   Comprehensive metabolic panel   Lipid panel   Essential hypertension   Well controlled, no changes to meds. Encouraged heart healthy diet such as the DASH diet and exercise as tolerated.        Relevant Medications   amLODipine (NORVASC) 10 MG tablet   hydrochlorothiazide (HYDRODIURIL) 25 MG tablet   losartan (COZAAR) 100 MG tablet   Other Relevant Orders   CBC with Differential/Platelet   Other Visit Diagnoses       Morbid obesity (HCC)    -  Primary   Relevant Orders   Insulin, random     Dyspepsia       Relevant Medications   omeprazole (PRILOSEC) 20 MG capsule     Hypokalemia       Relevant Medications   potassium chloride (KLOR-CON M) 10 MEQ tablet     New daily persistent headache       Relevant Medications   amLODipine (NORVASC) 10 MG tablet    Other Relevant Orders   Sedimentation rate     Need for influenza vaccination       Relevant Orders   Flu vaccine trivalent PF, 6mos and older(Flulaval,Afluria,Fluarix,Fluzone) (Completed)     Assessment and Plan    Headache   She presents with a headache localized to the left temple, persisting for about a week, with possible sinus involvement indicated by concurrent sneezing and nasal congestion. Slightly elevated blood pressure and work-related stress may also contribute. We will check her sedimentation rate, advise taking acetaminophen at headache onset,  and monitor and report if headaches persist or worsen.  Hypertension   Her blood pressure is slightly elevated, with a high diastolic pressure, and work-related stress may be a contributing factor. She has not reported home blood pressure monitoring. We will monitor her blood pressure regularly and advise stress management techniques such as deep breathing.  Peripheral Edema   She exhibits swelling in the right leg with associated knee and occasional hip pain. She is currently taking Lasix as needed. We advised taking Lasix daily for 3-4 days to manage swelling, then returning to an as-needed basis. Weight management has been noted as a factor. We will check electrolytes with blood work and consider a referral to an orthopedic specialist for knee pain.  General Health Maintenance   She requires routine vaccinations and health maintenance. She lost her COVID-19 vaccination card and was advised to get a new one. We will administer a flu shot, advise getting a COVID-19 booster shot downstairs, and schedule a physical exam in six months.  Follow-up   We will schedule a follow-up appointment in six months and advise coming in sooner if symptoms do not improve.        No follow-ups on file.    Donato Schultz, DO

## 2023-04-04 NOTE — Assessment & Plan Note (Signed)
Encourage heart healthy diet such as MIND or DASH diet, increase exercise, avoid trans fats, simple carbohydrates and processed foods, consider a krill or fish or flaxseed oil cap daily.  °

## 2023-04-04 NOTE — Assessment & Plan Note (Signed)
Well controlled, no changes to meds. Encouraged heart healthy diet such as the DASH diet and exercise as tolerated.  °

## 2023-04-05 LAB — INSULIN, RANDOM: Insulin: 37 u[IU]/mL — ABNORMAL HIGH

## 2023-04-05 LAB — VITAMIN D 25 HYDROXY (VIT D DEFICIENCY, FRACTURES): VITD: 48.74 ng/mL (ref 30.00–100.00)

## 2023-04-15 ENCOUNTER — Ambulatory Visit: Payer: Self-pay | Admitting: Family Medicine

## 2023-04-15 ENCOUNTER — Encounter: Payer: Self-pay | Admitting: Family Medicine

## 2023-04-15 ENCOUNTER — Ambulatory Visit: Payer: 59 | Admitting: Family Medicine

## 2023-04-15 VITALS — BP 134/82 | HR 85 | Temp 98.2°F | Resp 18 | Ht 68.0 in | Wt 294.6 lb

## 2023-04-15 DIAGNOSIS — R1010 Upper abdominal pain, unspecified: Secondary | ICD-10-CM

## 2023-04-15 DIAGNOSIS — R197 Diarrhea, unspecified: Secondary | ICD-10-CM | POA: Diagnosis not present

## 2023-04-15 MED ORDER — AZITHROMYCIN 250 MG PO TABS: ORAL_TABLET | ORAL | 0 refills | Status: DC

## 2023-04-15 NOTE — Progress Notes (Signed)
Acute Office Visit  Subjective:     Patient ID: [name], female    DOB: Aug 25, 1960, 62 y.o.   MRN: [id number]  Chief Complaint  Patient presents with   Diarrhea    Patient states that on Sunday she woke up with abdominal pain that she describes as " Gripping" pain, she states that shortly after she was up the diarrhea started. Patient states that she had a low grade fever on Sunday/Saturday.  She states she had to stay in the bed Sunday and Monday . States that the diarrhea has not stopped    HPI Patient is in today for abdominal pain and diarrhea since Sunday. Sunday pain was diffuse and settled in mid abdomen.  Ate at [facility] before symptoms started. She ate a burger. This was on Thursday night.  Friday she was fine. Saturday everything was fine.  Symptoms started Sunday after getting up to go to work, doubled over in pain, which has improved since then. Reports diarrhea when eating food, she will have 3-4 liquid stools. Keeping water down.  Water easing the pain, water not causing her to have diarrhea.   Chart review:  04/04/23: last PCP visit for morbid obesity.  History of hypertension.     Review of Systems  Constitutional:  Positive for fever (low grade 99.9 (not since Monday morning)).  Gastrointestinal:  Positive for abdominal pain and diarrhea. Negative for blood in stool, nausea and vomiting.        Objective:    BP 134/82   Pulse 85   Temp 98.2 F (36.8 C) (Oral)   Resp 18   Ht 5\' 8"  (1.727 m)   Wt 294 lb 9.6 oz (133.6 kg)   SpO2 99%   BMI 44.79 kg/m  BP Readings from Last 3 Encounters:  04/15/23 134/82  04/04/23 124/88  06/14/22 132/82      Physical Exam Vitals and nursing note reviewed.  Constitutional:      General: She is not in acute distress.    Appearance: Normal appearance. She is obese. She is ill-appearing. She is not toxic-appearing.  Cardiovascular:     Rate and Rhythm: Regular rhythm.     Heart sounds: Normal heart  sounds.  Pulmonary:     Effort: Pulmonary effort is normal.     Breath sounds: Normal breath sounds.  Abdominal:     General: Bowel sounds are normal.     Palpations: Abdomen is soft.     Tenderness: There is abdominal tenderness (generalized upper abdomen.).  Skin:    General: Skin is warm and dry.  Neurological:     General: No focal deficit present.     Mental Status: She is alert. Mental status is at baseline.  Psychiatric:        Mood and Affect: Mood normal.        Behavior: Behavior normal.        Thought Content: Thought content normal.        Judgment: Judgment normal.    No results found for any visits on 04/15/23.      Assessment & Plan:   Problem List Items Addressed This Visit     Pain of upper abdomen   Generalized upper abdominal pain. No LLQ or RLQ pain. Discussed abdominal pain and progression. Symptoms reviewed that warrant seeking higher level of care. This is day # 5 since start of pain, not worse. Vital signs are stable, no fever, no tachycardia present today. Likely to resolve with  current treatment.       Diarrhea of presumed infectious origin - Primary   Generalized upper abdominal pain since Sunday. Continues to have liquid diarrhea stools since Sunday. Diarrhea affected by eating, able to drink water. No fever, nausea, or vomiting. Azithromycin 500 mg today, followed by 250 mg daily for 4 days. Increase water intake, eat bland diet as tolerated. Will defer stool samples due to time required to get results back.       Relevant Medications   azithromycin (ZITHROMAX) 250 MG tablet    Meds ordered this encounter  Medications   azithromycin (ZITHROMAX) 250 MG tablet    Sig: Take 2 by mouth on day one, then one by mouth daily for 4 days    Dispense:  6 tablet    Refill:  0    Supervising Provider:   Suzan Slick [[id number]]  Agrees with plan of care discussed.  Questions answered.   Return if symptoms worsen or fail to improve.  Novella Olive, FNP

## 2023-04-15 NOTE — Assessment & Plan Note (Addendum)
Generalized upper abdominal pain. No LLQ or RLQ pain. Discussed abdominal pain and progression. Symptoms reviewed that warrant seeking higher level of care. This is day # 5 since start of pain, not worse. Vital signs are stable, no fever, no tachycardia present today. Likely to resolve with current treatment.

## 2023-04-15 NOTE — Telephone Encounter (Signed)
Copied from CRM [id number]. Topic: Clinical - Pink Word Triage >> Apr 15, 2023  8:24 AM [name] wrote: Reason for Triage: Pt stated that she has been having diarrhea since Sunday and is not able to keep food on her stomach and is also stomach pain as well.     Chief Complaint: abdominal pain with diarrhea x 5 days Symptoms: diarrhea Frequency: constant Pertinent Negatives: Patient denies blood in stool Disposition: [] ED /[] Urgent Care (no appt availability in office) / [x] Appointment(In office/virtual)/ []  [facility]/ [] Home Care/ [] Refused Recommended Disposition /[] [place] [facility]/ []  Follow-up with PCP Additional Notes: Pt reports abdominal pain with diarrhea x 5 days. Sts that  the pain is intense and aggravated when eating but relieved after the BM. No available appts at [facility], pt scheduled at [facility] today at 0920.     Reason for Disposition  [1] MODERATE pain (e.g., interferes with normal activities) AND [2] pain comes and goes (cramps) AND [3] present > 24 hours  (Exception: Pain with Vomiting or Diarrhea - see that Guideline.)  Answer Assessment - Initial Assessment Questions 1. LOCATION: "Where does it hurt?"      Upper middle area   2. RADIATION: "Does the pain shoot anywhere else?" (e.g., chest, back)     Was shoorting in lower for 1 day  3. ONSET: "When did the pain begin?" (e.g., minutes, hours or days ago)      Started 5 days ago  4. SUDDEN: "Gradual or sudden onset?"     Pain was Sudden onset  5. PATTERN "Does the pain come and go, or is it constant?"    - If it comes and goes: "How long does it last?" "Do you have pain now?"     (Note: Comes and goes means the pain is intermittent. It goes away completely between bouts.)    - If constant: "Is it getting better, staying the same, or getting worse?"      (Note: Constant means the pain never goes away completely; most serious pain is constant and gets worse.)      Pain comes and goes,  worse when trying to eat. Pain usually last about 10-15, followed by diarrhea  6. SEVERITY: "How bad is the pain?"  (e.g., Scale 1-10; mild, moderate, or severe)    - MILD (1-3): Doesn't interfere with normal activities, abdomen soft and not tender to touch.     - MODERATE (4-7): Interferes with normal activities or awakens from sleep, abdomen tender to touch.     - SEVERE (8-10): Excruciating pain, doubled over, unable to do any normal activities.       7-8/10 pain   7. RECURRENT SYMPTOM: "Have you ever had this type of stomach pain before?" If Yes, ask: "When was the last time?" and "What happened that time?"      Yes, but not for as many days. Last time it lasted about 24 hours, but it was stomache bug  8. CAUSE: "What do you think is causing the stomach pain?"     Unsure   9. RELIEVING/AGGRAVATING FACTORS: "What makes it better or worse?" (e.g., antacids, bending or twisting motion, bowel movement)     Food aggravates the pain, having diarrhea episode relieves the pain  10. OTHER SYMPTOMS: "Do you have any other symptoms?" (e.g., back pain, diarrhea, fever, urination pain, vomiting)       Fever, diarrhea,  Protocols used: Abdominal Pain - Female-A-AH

## 2023-04-15 NOTE — Assessment & Plan Note (Signed)
Generalized upper abdominal pain since Sunday. Continues to have liquid diarrhea stools since Sunday. Diarrhea affected by eating, able to drink water. No fever, nausea, or vomiting. Azithromycin 500 mg today, followed by 250 mg daily for 4 days. Increase water intake, eat bland diet as tolerated. Will defer stool samples due to time required to get results back.

## 2023-05-05 ENCOUNTER — Ambulatory Visit: Payer: 59 | Admitting: Internal Medicine

## 2023-05-05 ENCOUNTER — Ambulatory Visit: Payer: Self-pay | Admitting: Family Medicine

## 2023-05-05 NOTE — Telephone Encounter (Addendum)
Copied from CRM [id number]. Topic: Clinical - Medical Advice >> May 05, 2023 11:53 AM Maxwell Marion wrote: Reason for CRM: Patient wanted to know if she could take aleve to alleviate her stomach pain, it's not as bad as it was this morning but it feels like a light punch at the top of her stomach. She initially called to schedule appointment which has been scheduled for tomorrow with PCP but she wanted to know what could she take for the pain in the mean time. Patient would like a call back    Chief Complaint: Abdominal pain Symptoms: Upper middle abd pain, shoulder pain, and diahrrhea Pertinent Negatives: Patient denies vomiting Disposition: [] ED /[] Urgent Care (no appt availability in office) / [x] Appointment(In office/virtual)/ []  [facility]/ [] Home Care/ [] Refused Recommended Disposition /[] [facility]/ []  Follow-up with PCP  Additional Notes: Patient stated she is having abdominal pain and 1 episode of diarrhea that started this morning. The pain has been constant since 6:30 am, but it is not as intense anymore. The pain is a 4/10 now. Patient had an appt scheduled for tomorrow, but she requested an appointment for today instead. No availability at her regular office today. Appointment rescheduled for today per pt request at different location.   Reason for Disposition  Age > 60 years  Answer Assessment - Initial Assessment Questions 1. LOCATION: "Where does it hurt?"      Top middle area of abdomen  2. RADIATION: "Does the pain shoot anywhere else?" (e.g., chest, back)     Patient also mentioned shoulder pain. Denies chest or back pain  3. ONSET: "When did the pain begin?" (e.g., minutes, hours or days ago)      This morning around 6:30 am  4. PATTERN "Does the pain come and go, or is it constant?"    - If it comes and goes: "How long does it last?" "Do you have pain now?"     (Note: Comes and goes means the pain is intermittent. It goes away completely between  bouts.)    - If constant: "Is it getting better, staying the same, or getting worse?"      (Note: Constant means the pain never goes away completely; most serious pain is constant and gets worse.)      Constant  5. SEVERITY: "How bad is the pain?"  (e.g., Scale 1-10; mild, moderate, or severe)    - MILD (1-3): Doesn't interfere with normal activities, abdomen soft and not tender to touch.     - MODERATE (4-7): Interferes with normal activities or awakens from sleep, abdomen tender to touch.     - SEVERE (8-10): Excruciating pain, doubled over, unable to do any normal activities.       4/10 right now, but felt worse this morning  6. RECURRENT SYMPTOM: "Have you ever had this type of stomach pain before?" If Yes, ask: "When was the last time?" and "What happened that time?"      Yes, patent stated she felt similar pain before in December and it resolved.  7. CAUSE: "What do you think is causing the stomach pain?"     Unknown cause  8. RELIEVING/AGGRAVATING FACTORS: "What makes it better or worse?" (e.g., antacids, bending or twisting motion, bowel movement)     Worse with movement  9. OTHER SYMPTOMS: "Do you have any other symptoms?" (e.g., back pain, diarrhea, fever, urination pain, vomiting)       Diarrhea  Protocols used: Abdominal Pain - Female-A-AH

## 2023-05-05 NOTE — Telephone Encounter (Signed)
Pt was scheduled at Dr Iona Hansen office, but we have openings here at our office. (Not sure why this occurred).

## 2023-05-06 ENCOUNTER — Ambulatory Visit (INDEPENDENT_AMBULATORY_CARE_PROVIDER_SITE_OTHER): Payer: 59 | Admitting: Family Medicine

## 2023-05-06 ENCOUNTER — Ambulatory Visit (HOSPITAL_BASED_OUTPATIENT_CLINIC_OR_DEPARTMENT_OTHER)
Admission: RE | Admit: 2023-05-06 | Discharge: 2023-05-06 | Disposition: A | Discharge status: Home / Self Care | Payer: 59 | Source: Ambulatory Visit | Attending: Family Medicine | Admitting: Family Medicine

## 2023-05-06 ENCOUNTER — Ambulatory Visit: Payer: 59 | Admitting: Family Medicine

## 2023-05-06 ENCOUNTER — Encounter: Payer: Self-pay | Admitting: Family Medicine

## 2023-05-06 VITALS — BP 126/80 | HR 84 | Temp 97.6°F | Resp 18 | Ht 68.0 in | Wt 291.8 lb

## 2023-05-06 DIAGNOSIS — R002 Palpitations: Secondary | ICD-10-CM

## 2023-05-06 DIAGNOSIS — R8290 Unspecified abnormal findings in urine: Secondary | ICD-10-CM

## 2023-05-06 DIAGNOSIS — R1013 Epigastric pain: Secondary | ICD-10-CM | POA: Diagnosis present

## 2023-05-06 DIAGNOSIS — R3989 Other symptoms and signs involving the genitourinary system: Secondary | ICD-10-CM | POA: Diagnosis not present

## 2023-05-06 LAB — CBC WITH DIFFERENTIAL/PLATELET
Basophils Absolute: 0 10*3/uL (ref 0.0–0.1)
Basophils Relative: 1.1 % (ref 0.0–3.0)
Eosinophils Absolute: 0.1 10*3/uL (ref 0.0–0.7)
Eosinophils Relative: 4.1 % (ref 0.0–5.0)
HCT: 39 % (ref 36.0–46.0)
Hemoglobin: 12.8 g/dL (ref 12.0–15.0)
Lymphocytes Relative: 23.3 % (ref 12.0–46.0)
Lymphs Abs: 0.8 10*3/uL (ref 0.7–4.0)
MCHC: 32.8 g/dL (ref 30.0–36.0)
MCV: 87.1 fL (ref 78.0–100.0)
Monocytes Absolute: 0.3 10*3/uL (ref 0.1–1.0)
Monocytes Relative: 8.5 % (ref 3.0–12.0)
Neutro Abs: 2.3 10*3/uL (ref 1.4–7.7)
Neutrophils Relative %: 63 % (ref 43.0–77.0)
Platelets: 293 10*3/uL (ref 150.0–400.0)
RBC: 4.48 Mil/uL (ref 3.87–5.11)
RDW: 13.3 % (ref 11.5–15.5)
WBC: 3.6 10*3/uL — ABNORMAL LOW (ref 4.0–10.5)

## 2023-05-06 LAB — COMPREHENSIVE METABOLIC PANEL
ALT: 306 U/L — ABNORMAL HIGH (ref 0–35)
AST: 381 U/L — ABNORMAL HIGH (ref 0–37)
Albumin: 3.9 g/dL (ref 3.5–5.2)
Alkaline Phosphatase: 153 U/L — ABNORMAL HIGH (ref 39–117)
BUN: 16 mg/dL (ref 6–23)
CO2: 28 meq/L (ref 19–32)
Calcium: 8.9 mg/dL (ref 8.4–10.5)
Chloride: 104 meq/L (ref 96–112)
Creatinine, Ser: 1.02 mg/dL (ref 0.40–1.20)
GFR: 59.03 mL/min — ABNORMAL LOW (ref >60.00)
Glucose, Bld: 70 mg/dL (ref 70–99)
Potassium: 3.5 meq/L (ref 3.5–5.1)
Sodium: 141 meq/L (ref 135–145)
Total Bilirubin: 4.3 mg/dL — ABNORMAL HIGH (ref 0.2–1.2)
Total Protein: 6.8 g/dL (ref 6.0–8.3)

## 2023-05-06 LAB — POC URINALSYSI DIPSTICK (AUTOMATED)
Blood, UA: NEGATIVE
Glucose, UA: NEGATIVE
Ketones, UA: 80
Nitrite, UA: POSITIVE
Protein, UA: POSITIVE — AB
Spec Grav, UA: >=1.03 — AB (ref 1.010–1.025)
Urobilinogen, UA: 2 U/dL — AB
pH, UA: 6 (ref 5.0–8.0)

## 2023-05-06 MED ORDER — FAMOTIDINE 20 MG PO TABS: 20.0000 mg | ORAL_TABLET | Freq: Two times a day (BID) | ORAL | 2 refills | Status: DC

## 2023-05-06 NOTE — Patient Instructions (Signed)

## 2023-05-06 NOTE — Progress Notes (Signed)
+++  Established Patient Office Visit  Subjective   Patient ID: [name], female    DOB: 1960/08/02  Age: 63 y.o. MRN: [id number]  Chief Complaint  Patient presents with   Abdominal Pain   Diarrhea   Follow-up    HPI The patient, with a history of bladder issues, presented with a recent episode of severe abdominal pain that woke her up early in the morning. The pain was described as a gripping sensation located at the top of the abdomen. Accompanying the pain was an episode of diarrhea. The pain persisted throughout the day and resolved around 10 PM. The patient denied any similar episodes in the past. She had consumed beans and wheat crackers the night before the episode as part of a Daniel fast. The patient also reported a sensation of something being stuck in her chest and shoulder pain.  In addition to the abdominal pain, the patient reported changes in her urine color to orange, despite adequate water intake. She denied any burning sensation during urination or increased frequency. Review of Systems  Constitutional:  Negative for fever and malaise/fatigue.  HENT:  Negative for congestion.   Eyes:  Negative for blurred vision.  Respiratory:  Negative for shortness of breath.   Cardiovascular:  Negative for chest pain, palpitations and leg swelling.  Gastrointestinal:  Positive for abdominal pain. Negative for blood in stool, constipation, diarrhea and nausea.       Loose stool x1  Genitourinary:  Negative for dysuria and frequency.  Musculoskeletal:  Negative for falls.  Skin:  Negative for rash.  Neurological:  Negative for dizziness, loss of consciousness and headaches.  Endo/Heme/Allergies:  Negative for environmental allergies.  Psychiatric/Behavioral:  Negative for depression. The patient is not nervous/anxious.       Objective:     BP 126/80 (BP Location: Left Arm, Patient Position: Sitting)   Pulse 84   Temp 97.6 F (36.4 C) (Oral)   Resp 18   Ht 5\' 8"   (1.727 m)   Wt 291 lb 12.8 oz (132.4 kg)   SpO2 97%   BMI 44.37 kg/m  BP Readings from Last 3 Encounters:  05/06/23 126/80  04/15/23 134/82  04/04/23 124/88   Wt Readings from Last 3 Encounters:  05/06/23 291 lb 12.8 oz (132.4 kg)  04/15/23 294 lb 9.6 oz (133.6 kg)  04/04/23 (!) 310 lb (140.6 kg)   SpO2 Readings from Last 3 Encounters:  05/06/23 97%  04/15/23 99%  04/04/23 97%      Physical Exam Vitals and nursing note reviewed.  Constitutional:      General: She is not in acute distress.    Appearance: Normal appearance. She is well-developed.  HENT:     Head: Normocephalic and atraumatic.  Eyes:     General: No scleral icterus.       Right eye: No discharge.        Left eye: No discharge.  Cardiovascular:     Rate and Rhythm: Normal rate and regular rhythm.     Heart sounds: No murmur heard. Pulmonary:     Effort: Pulmonary effort is normal. No respiratory distress.     Breath sounds: Normal breath sounds.  Abdominal:     Tenderness: There is abdominal tenderness in the epigastric area. There is no guarding or rebound.  Musculoskeletal:        General: Normal range of motion.     Cervical back: Normal range of motion and neck supple.  Right lower leg: No edema.     Left lower leg: No edema.  Skin:    General: Skin is warm and dry.  Neurological:     Mental Status: She is alert and oriented to person, place, and time.  Psychiatric:        Mood and Affect: Mood normal.        Behavior: Behavior normal.        Thought Content: Thought content normal.        Judgment: Judgment normal.      Results for orders placed or performed in visit on 05/06/23  CBC with Differential/Platelet  Result Value Ref Range   WBC 3.6 (L) 4.0 - 10.5 K/uL   RBC 4.48 3.87 - 5.11 Mil/uL   Hemoglobin 12.8 12.0 - 15.0 g/dL   HCT 95.6 38.7 - 56.4 %   MCV 87.1 78.0 - 100.0 fl   MCHC 32.8 30.0 - 36.0 g/dL   RDW 33.2 95.1 - 88.4 %   Platelets 293.0 150.0 - 400.0 K/uL    Neutrophils Relative % 63.0 43.0 - 77.0 %   Lymphocytes Relative 23.3 12.0 - 46.0 %   Monocytes Relative 8.5 3.0 - 12.0 %   Eosinophils Relative 4.1 0.0 - 5.0 %   Basophils Relative 1.1 0.0 - 3.0 %   Neutro Abs 2.3 1.4 - 7.7 K/uL   Lymphs Abs 0.8 0.7 - 4.0 K/uL   Monocytes Absolute 0.3 0.1 - 1.0 K/uL   Eosinophils Absolute 0.1 0.0 - 0.7 K/uL   Basophils Absolute 0.0 0.0 - 0.1 K/uL  Comprehensive metabolic panel  Result Value Ref Range   Sodium 141 135 - 145 mEq/L   Potassium 3.5 3.5 - 5.1 mEq/L   Chloride 104 96 - 112 mEq/L   CO2 28 19 - 32 mEq/L   Glucose, Bld 70 70 - 99 mg/dL   BUN 16 6 - 23 mg/dL   Creatinine, Ser 1.66 0.40 - 1.20 mg/dL   Total Bilirubin 4.3 (H) 0.2 - 1.2 mg/dL   Alkaline Phosphatase 153 (H) 39 - 117 U/L   AST 381 (H) 0 - 37 U/L   ALT 306 (H) 0 - 35 U/L   Total Protein 6.8 6.0 - 8.3 g/dL   Albumin 3.9 3.5 - 5.2 g/dL   GFR 06.30 (L) >16.01 mL/min   Calcium 8.9 8.4 - 10.5 mg/dL  POCT Urinalysis Dipstick (Automated)  Result Value Ref Range   Color, UA amber    Clarity, UA cloudy    Glucose, UA Negative Negative   Bilirubin, UA large    Ketones, UA 80    Spec Grav, UA >=1.030 (A) 1.010 - 1.025   Blood, UA negative    pH, UA 6.0 5.0 - 8.0   Protein, UA Positive (A) Negative   Urobilinogen, UA 2.0 (A) 0.2 or 1.0 E.U./dL   Nitrite, UA positive    Leukocytes, UA Small (1+) (A) Negative    Last CBC Lab Results  Component Value Date   WBC 3.6 (L) 05/06/2023   HGB 12.8 05/06/2023   HCT 39.0 05/06/2023   MCV 87.1 05/06/2023   MCH 28.7 02/21/2019   RDW 13.3 05/06/2023   PLT 293.0 05/06/2023   Last metabolic panel Lab Results  Component Value Date   GLUCOSE 70 05/06/2023   NA 141 05/06/2023   K 3.5 05/06/2023   CL 104 05/06/2023   CO2 28 05/06/2023   BUN 16 05/06/2023   CREATININE 1.02 05/06/2023   GFR 59.03 (L)  05/06/2023   CALCIUM 8.9 05/06/2023   PROT 6.8 05/06/2023   ALBUMIN 3.9 05/06/2023   LABGLOB 2.4 07/12/2019   AGRATIO 1.6 07/12/2019    BILITOT 4.3 (H) 05/06/2023   ALKPHOS 153 (H) 05/06/2023   AST 381 (H) 05/06/2023   ALT 306 (H) 05/06/2023   Last lipids Lab Results  Component Value Date   CHOL 175 04/04/2023   HDL 63.40 04/04/2023   LDLCALC 95 04/04/2023   LDLDIRECT 128.5 04/30/2008   TRIG 80.0 04/04/2023   CHOLHDL 3 04/04/2023   Last hemoglobin A1c Lab Results  Component Value Date   HGBA1C 5.1 04/04/2023   Last thyroid functions Lab Results  Component Value Date   TSH 2.42 04/04/2023   T3TOTAL 110 07/12/2019   T4TOTAL 10.1 06/14/2022   Last vitamin D Lab Results  Component Value Date   VD25OH 48.74 04/04/2023   Last vitamin B12 and Folate Lab Results  Component Value Date   VITAMINB12 673 02/21/2019      The 10-year ASCVD risk score (Arnett DK, et al., 2019) is: 6.1%    Assessment & Plan:   Problem List Items Addressed This Visit   None Visit Diagnoses       Epigastric pain    -  Primary   Relevant Medications   famotidine (PEPCID) 20 MG tablet   Other Relevant Orders   US Abdomen Limited RUQ (LIVER/GB)   CBC with Differential/Platelet (Completed)   Comprehensive metabolic panel (Completed)   H. pylori breath test   Ambulatory referral to Gastroenterology     Abnormal urine color       Relevant Orders   POCT Urinalysis Dipstick (Automated) (Completed)     Palpitation       Relevant Orders   EKG 12-Lead (Completed)     Dyspepsia       Relevant Orders   Ambulatory referral to Gastroenterology     Abnormal finding on urinalysis       Relevant Orders   Urine Culture     .Epigastric Pain Acute gripping epigastric pain with diarrhea resolved by the next day. Possible causes include gastritis, peptic ulcer disease, or gallbladder issues. Recent consumption of pinto beans with onions may have contributed. Treatment involves prescribing acid-reducing medication, ordering blood work, an H. pylori breath test, and a gallbladder ultrasound.  Palpitations Palpitations and fatigue  occurred while singing after starting the Reuel Boom fast, without chest pain or recurrence. Likely related to fasting and reduced caloric intake. An EKG is ordered to evaluate cardiac function. Abnormal Urine Color Orange-colored urine without dysuria, increased frequency, or odor, despite adequate water intake. The cause is unclear, possibly dietary changes or dehydration. Monitoring of urine color and hydration status is advised.   Return if symptoms worsen or fail to improve.    Donato Schultz, DO

## 2023-05-06 NOTE — Telephone Encounter (Signed)
Pt has appt today

## 2023-05-07 LAB — URINE CULTURE
MICRO NUMBER:: 15970301
SPECIMEN QUALITY:: ADEQUATE

## 2023-05-08 ENCOUNTER — Encounter: Payer: Self-pay | Admitting: Family Medicine

## 2023-05-08 ENCOUNTER — Other Ambulatory Visit: Payer: Self-pay | Admitting: Family Medicine

## 2023-05-08 DIAGNOSIS — R1013 Epigastric pain: Secondary | ICD-10-CM

## 2023-05-08 DIAGNOSIS — K828 Other specified diseases of gallbladder: Secondary | ICD-10-CM

## 2023-06-01 ENCOUNTER — Ambulatory Visit: Payer: 59 | Admitting: Gastroenterology

## 2023-06-01 ENCOUNTER — Other Ambulatory Visit (INDEPENDENT_AMBULATORY_CARE_PROVIDER_SITE_OTHER): Payer: 59

## 2023-06-01 ENCOUNTER — Encounter: Payer: Self-pay | Admitting: Gastroenterology

## 2023-06-01 VITALS — BP 126/90 | HR 76 | Ht 68.0 in | Wt 293.0 lb

## 2023-06-01 DIAGNOSIS — K828 Other specified diseases of gallbladder: Secondary | ICD-10-CM

## 2023-06-01 DIAGNOSIS — R7989 Other specified abnormal findings of blood chemistry: Secondary | ICD-10-CM | POA: Diagnosis not present

## 2023-06-01 DIAGNOSIS — R1013 Epigastric pain: Secondary | ICD-10-CM | POA: Diagnosis not present

## 2023-06-01 DIAGNOSIS — K760 Fatty (change of) liver, not elsewhere classified: Secondary | ICD-10-CM | POA: Diagnosis not present

## 2023-06-01 LAB — COMPREHENSIVE METABOLIC PANEL
ALT: 13 U/L (ref 0–35)
AST: 14 U/L (ref 0–37)
Albumin: 3.9 g/dL (ref 3.5–5.2)
Alkaline Phosphatase: 70 U/L (ref 39–117)
BUN: 19 mg/dL (ref 6–23)
CO2: 29 meq/L (ref 19–32)
Calcium: 9 mg/dL (ref 8.4–10.5)
Chloride: 102 meq/L (ref 96–112)
Creatinine, Ser: 1.12 mg/dL (ref 0.40–1.20)
GFR: 52.74 mL/min — ABNORMAL LOW (ref >60.00)
Glucose, Bld: 85 mg/dL (ref 70–99)
Potassium: 3.7 meq/L (ref 3.5–5.1)
Sodium: 139 meq/L (ref 135–145)
Total Bilirubin: 0.9 mg/dL (ref 0.2–1.2)
Total Protein: 7.4 g/dL (ref 6.0–8.3)

## 2023-06-01 LAB — CBC WITH DIFFERENTIAL/PLATELET
Basophils Absolute: 0.1 10*3/uL (ref 0.0–0.1)
Basophils Relative: 1.1 % (ref 0.0–3.0)
Eosinophils Absolute: 0.2 10*3/uL (ref 0.0–0.7)
Eosinophils Relative: 4.8 % (ref 0.0–5.0)
HCT: 38.6 % (ref 36.0–46.0)
Hemoglobin: 12.8 g/dL (ref 12.0–15.0)
Lymphocytes Relative: 24.6 % (ref 12.0–46.0)
Lymphs Abs: 1.3 10*3/uL (ref 0.7–4.0)
MCHC: 33.3 g/dL (ref 30.0–36.0)
MCV: 86.1 fL (ref 78.0–100.0)
Monocytes Absolute: 0.4 10*3/uL (ref 0.1–1.0)
Monocytes Relative: 7.9 % (ref 3.0–12.0)
Neutro Abs: 3.2 10*3/uL (ref 1.4–7.7)
Neutrophils Relative %: 61.6 % (ref 43.0–77.0)
Platelets: 288 10*3/uL (ref 150.0–400.0)
RBC: 4.48 Mil/uL (ref 3.87–5.11)
RDW: 13.5 % (ref 11.5–15.5)
WBC: 5.1 10*3/uL (ref 4.0–10.5)

## 2023-06-01 LAB — PROTIME-INR
INR: 1.1 {ratio} — ABNORMAL HIGH (ref 0.8–1.0)
Prothrombin Time: 11.2 s (ref 9.6–13.1)

## 2023-06-01 NOTE — Patient Instructions (Addendum)
We have your next appointment scheduled for 08/01/23 at 9:20. Please make sure that you arrive 15 minutes early.  Your provider has requested that you go to the basement level for lab work before leaving today. Press "B" on the elevator. The lab is located at the first door on the left as you exit the elevator.  Your provider has ordered "[employer]" stool testing for you. You have received a kit from our office today containing all necessary supplies to complete this test. Please carefully read the stool collection instructions provided in the kit before opening the accompanying materials. In addition, be sure there is a label providing your full name and date of birth on the "puritan opti-swab" tube that is supplied in the kit (if you do not see a label with this information on your test tube, please make [place] aware before test collection!). After completing the test, you should secure the purtian tube into the specimen biohazard bag. The [facility] E-Req sheet (including date and time of specimen collection) should be placed into the outside pocket of the specimen biohazard bag and returned to the [place] lab (basement floor of [facility]) within 3 days of collection. Please make sure to give the specimen to a staff member at the lab. DO NOT leave the specimen on the counter.   If the specimen date and time (can be found in the upper right boxed portion of the sheet) are not filled out on the E-Req sheet, the test will NOT be performed.   Due to recent changes in healthcare laws, you may see the results of your imaging and laboratory studies on MyChart before your provider has had a chance to review them.  We understand that in some cases there may be results that are confusing or concerning to you. Not all laboratory results come back in the same time frame and the provider may be waiting for multiple results in order to interpret others.  Please give [place] 48 hours in order for  your provider to thoroughly review all the results before contacting the office for clarification of your results.   Thank you for trusting me with your gastrointestinal care!   Boone Master, PA

## 2023-06-01 NOTE — Progress Notes (Signed)
 Chief Complaint: Epigastric pain Primary GI MD: Dr. Barron Alvine  HPI: 63 year old female with past medical history including disease, presents for evaluation of epigastric pain  Patient was seen by PCP for epigastric pain  RUQ ultrasound 05/06/2023 showed gallbladder sludge.  No evidence of cholelithiasis or cholecystitis.  Hepatic steatosis.  Labs 05/06/2023 AST 381, ALT 306, alk phos 153, total bilirubin 4.3 No anemia or leukocytosis.  Normal platelets  Patient noted she began having epigastric pain with eating.  This resulted in her avoiding food and consuming only water.  She states this epigastric pain lasted for a few weeks and then improved and she is currently feeling better at this time.  She did have elevated LFTs during her most severe episode of epigastric pain noted as above.  Denies GERD.  States famotidine did not help.  She does note she had an episode of diarrhea ongoing for 2 weeks around December that has since resolved.  Had a 3 pound weight loss that has since returned.  Denies NSAID use     Wt Readings from Last 3 Encounters:  06/01/23 293 lb (132.9 kg)  05/06/23 291 lb 12.8 oz (132.4 kg)  04/15/23 294 lb 9.6 oz (133.6 kg)      PREVIOUS GI WORKUP   Colonoscopy 08/03/2019 for screening - Two 2 to 4 mm polyps in the transverse colon and in the ascending colon, removed with a cold snare. Resected and retrieved.  - Diverticulosis in the sigmoid colon, in the transverse colon and in the ascending colon. - Repeat 5 years (07/2024)  Surgical [P], colon, transverse and ascending, polyp (2) - TUBULAR ADENOMA. - NO HIGH GRADE DYSPLASIA OR CARCINOMA. - SESSILE SERRATED POLYP WITHOUT CYTOLOGIC DYSPLASIA.  EGD 10/2009 for GERD - Normal EGD - Biopsies negative for H. pylori, EOE, intestinal metaplasia, dysplasia, or malignancy  Past Medical History:  Diagnosis Date   Abdominal pain, other specified site    Acute upper respiratory infections of unspecified site     Allergy    seasonal   Anemia    Benign neoplasm of skin, site unspecified    Disturbance of skin sensation    Diverticulosis of colon (without mention of hemorrhage)    Family history of diabetes mellitus    Family history of ischemic heart disease    Family history of malignant neoplasm of genital organ, other    GERD (gastroesophageal reflux disease)    Hypertension    Hypokalemia    Left shoulder pain    Low blood potassium    Post-menopausal    Rheumatoid arthritis (HCC)    Sleep apnea    wears cpap    Sprain and strain of unspecified site of knee and leg    Sprain of neck    Thyroid disease    Unspecified essential hypertension     Past Surgical History:  Procedure Laterality Date   ABDOMINAL HYSTERECTOMY     BREAST BIOPSY Right 10/30/2021   BREAST LUMPECTOMY WITH RADIOACTIVE SEED LOCALIZATION Right 12/30/2021   Procedure: RIGHT BREAST LUMPECTOMY WITH RADIOACTIVE SEED LOCALIZATION;  Surgeon: Harriette Bouillon, MD;  Location: [facility];  Service: General;  Laterality: Right;   BUNIONECTOMY  2005   right foot   COLONOSCOPY  6-12/2008   SHOULDER SURGERY     left;spurs 2-09- dr Rolene Arbour   TOTAL ABDOMINAL HYSTERECTOMY W/ BILATERAL SALPINGOOPHORECTOMY  2002    Current Outpatient Medications  Medication Sig Dispense Refill   amLODipine (NORVASC) 10 MG tablet Take 1 tablet (10  mg total) by mouth daily. 90 tablet 3   Cholecalciferol (VITAMIN D) 50 MCG (2000 UT) CAPS Take 1 capsule (2,000 Units total) by mouth daily. 30 capsule 0   estradiol (ESTRACE) 2 MG tablet Take 1 mg by mouth as needed.     famotidine (PEPCID) 20 MG tablet Take 1 tablet (20 mg total) by mouth 2 (two) times daily. 60 tablet 2   fluticasone (FLONASE) 50 MCG/ACT nasal spray INSTILL TWO SPRAYS INTO BOTH NOSTRIL DAILY. 16 g 2   furosemide (LASIX) 40 MG tablet 1 every 3 days if needed. 30 tablet 0   hydrochlorothiazide (HYDRODIURIL) 25 MG tablet Take 1 tablet (25 mg total) by mouth daily. 90 tablet 3    hydroxychloroquine (PLAQUENIL) 200 MG tablet Take 200 mg by mouth 2 (two) times daily.     ibuprofen (ADVIL) 800 MG tablet Take 1 tablet (800 mg total) by mouth every 8 (eight) hours as needed. 30 tablet 0   losartan (COZAAR) 100 MG tablet Take 1 tablet (100 mg total) by mouth daily. 90 tablet 3   naproxen sodium (ALEVE) 220 MG tablet Take 220 mg by mouth.     omeprazole (PRILOSEC) 20 MG capsule Take 1 capsule (20 mg total) by mouth daily. 90 capsule 1   potassium chloride (KLOR-CON M) 10 MEQ tablet Take 2 tablets (20 mEq total) by mouth 2 (two) times daily. 120 tablet 5   RESTASIS 0.05 % ophthalmic emulsion Place 1 drop into both eyes daily.     azithromycin (ZITHROMAX) 250 MG tablet Take 2 by mouth on day one, then one by mouth daily for 4 days (Patient not taking: Reported on 06/01/2023) 6 tablet 0   levothyroxine (SYNTHROID) 50 MCG tablet Take 1 tablet (50 mcg total) by mouth daily. (Patient not taking: Reported on 04/15/2023) 90 tablet 3   No current facility-administered medications for this visit.    Allergies as of 06/01/2023   (No Known Allergies)    Family History  Problem Relation Age of Onset   Diabetes Mother    Hypertension Mother    Dementia Mother    Stroke Mother    Prostate cancer Father    Hypertension Father    Cancer Father    Hypertension Sister    Diabetes Brother    Stroke Maternal Aunt    Breast cancer Maternal Aunt    Breast cancer Cousin    Colon cancer Neg Hx    Colon polyps Neg Hx    Esophageal cancer Neg Hx    Rectal cancer Neg Hx    Stomach cancer Neg Hx     Social History   Socioeconomic History   Marital status: Married    Spouse name: Not on file   Number of children: 0   Years of education: Not on file   Highest education level: Associate degree: occupational, Scientist, product/process development, or vocational program  Occupational History   Occupation: Forensic scientist CLERK    Employer: [employer]er, [employer]   Occupation: retired    Comment: 03/18/2021  Tobacco Use    Smoking status: Never   Smokeless tobacco: Never  Substance and Sexual Activity   Alcohol use: No   Drug use: No   Sexual activity: Yes    Partners: Male  Other Topics Concern   Not on file  Social History Narrative   Exercise -- walking 15 min x 3 a day    Social Drivers of Health   Financial Resource Strain: Low Risk  (05/05/2023)   Overall Financial  Resource Strain (CARDIA)    Difficulty of Paying Living Expenses: Not hard at all  Food Insecurity: No Food Insecurity (05/05/2023)   Hunger Vital Sign    Worried About Running Out of Food in the Last Year: Never true    Ran Out of Food in the Last Year: Never true  Transportation Needs: No Transportation Needs (05/05/2023)   PRAPARE - Administrator, Civil Service (Medical): No    Lack of Transportation (Non-Medical): No  Physical Activity: Insufficiently Active (05/05/2023)   Exercise Vital Sign    Days of Exercise per Week: 5 days    Minutes of Exercise per Session: 10 min  Stress: No Stress Concern Present (05/05/2023)   [employer] of Occupational Health - Occupational Stress Questionnaire    Feeling of Stress : Only a little  Social Connections: Moderately Integrated (05/05/2023)   Social Connection and Isolation Panel [NHANES]    Frequency of Communication with Friends and Family: More than three times a week    Frequency of Social Gatherings with Friends and Family: Once a week    Attends Religious Services: More than 4 times per year    Active Member of [employer] or Organizations: No    Attends Engineer, structural: Not on file    Marital Status: Married  Catering manager Violence: Not on file    Review of Systems:    Constitutional: No weight loss, fever, chills, weakness or fatigue HEENT: Eyes: No change in vision               Ears, Nose, Throat:  No change in hearing or congestion Skin: No rash or itching Cardiovascular: No chest pain, chest pressure or palpitations   Respiratory: No SOB  or cough Gastrointestinal: See HPI and otherwise negative Genitourinary: No dysuria or change in urinary frequency Neurological: No headache, dizziness or syncope Musculoskeletal: No new muscle or joint pain Hematologic: No bleeding or bruising Psychiatric: No history of depression or anxiety    Physical Exam:  Vital signs: BP (!) 126/90   Pulse 76   Ht 5\' 8"  (1.727 m)   Wt 293 lb (132.9 kg)   BMI 44.55 kg/m   Constitutional: NAD, Well developed, Well nourished, alert and cooperative Head:  Normocephalic and atraumatic. Eyes:   PEERL, EOMI. No icterus. Conjunctiva pink. Respiratory: Respirations even and unlabored. Lungs clear to auscultation bilaterally.   No wheezes, crackles, or rhonchi.  Cardiovascular:  Regular rate and rhythm. No peripheral edema, cyanosis or pallor.  Gastrointestinal:  Soft, nondistended, nontender. No rebound or guarding. Normal bowel sounds. No appreciable masses or hepatomegaly. Rectal:  Not performed.  Msk:  Symmetrical without gross deformities. Without edema, no deformity or joint abnormality.  Neurologic:  Alert and  oriented x4;  grossly normal neurologically.  Skin:   Dry and intact without significant lesions or rashes. Psychiatric: Oriented to person, place and time. Demonstrates good judgement and reason without abnormal affect or behaviors.  RELEVANT LABS AND IMAGING: CBC    Component Value Date/Time   WBC 3.6 (L) 05/06/2023 1149   RBC 4.48 05/06/2023 1149   HGB 12.8 05/06/2023 1149   HGB 11.8 02/21/2019 1045   HCT 39.0 05/06/2023 1149   HCT 35.4 02/21/2019 1045   PLT 293.0 05/06/2023 1149   PLT 183 02/21/2019 1045   MCV 87.1 05/06/2023 1149   MCV 86 02/21/2019 1045   MCH 28.7 02/21/2019 1045   MCH 27.1 08/02/2011 1839   MCHC 32.8 05/06/2023 1149   RDW  13.3 05/06/2023 1149   RDW 12.9 02/21/2019 1045   LYMPHSABS 0.8 05/06/2023 1149   LYMPHSABS 1.3 02/21/2019 1045   MONOABS 0.3 05/06/2023 1149   EOSABS 0.1 05/06/2023 1149    EOSABS 0.3 02/21/2019 1045   BASOSABS 0.0 05/06/2023 1149   BASOSABS 0.1 02/21/2019 1045    CMP     Component Value Date/Time   NA 141 05/06/2023 1149   NA 143 07/12/2019 1656   K 3.5 05/06/2023 1149   CL 104 05/06/2023 1149   CO2 28 05/06/2023 1149   GLUCOSE 70 05/06/2023 1149   GLUCOSE 74 03/03/2006 1039   BUN 16 05/06/2023 1149   BUN 17 07/12/2019 1656   CREATININE 1.02 05/06/2023 1149   CALCIUM 8.9 05/06/2023 1149   PROT 6.8 05/06/2023 1149   PROT 6.2 07/12/2019 1656   ALBUMIN 3.9 05/06/2023 1149   ALBUMIN 3.8 07/12/2019 1656   AST 381 (H) 05/06/2023 1149   ALT 306 (H) 05/06/2023 1149   ALKPHOS 153 (H) 05/06/2023 1149   BILITOT 4.3 (H) 05/06/2023 1149   BILITOT 0.5 07/12/2019 1656   GFRNONAA 65 07/12/2019 1656   GFRAA 75 07/12/2019 1656     Assessment/Plan:      Epigastric Pain Pain was worse with eating, now better. No nausea, vomiting, or significant weight loss. Gallbladder sludge noted on ultrasound.  Elevated LFTs noted as well likely secondary to gallbladder sludge causing some obstruction.  No GERD and no improvement on famotidine.  Suspect source of her epigastric pain was related to her gallbladder.  She is scheduled with [facility] 2/21 -Continue with appointment with [facility] 06/10/2023 - Repeat CMP today to check liver enzymes, also be a CBC and PT/INR - H. pylori stool antigen -- Consider endoscopy if pain persists after surgical consultation.  Elevated Liver Enzymes Noted in January, possibly related to gallbladder sludge. No jaundice or clay-colored stools reported. -- Redraw liver enzymes to assess current status.  She did have a history of hepatic steatosis on ultrasound.  If liver enzymes are still elevated we can do serologic workup  Gastroenteritis Resolved. -No further action needed at this time.  Hepatic steatosis Identified on ultrasound. -Continue monitoring.    [name] [facility] 06/01/2023, 9:13 AM  Cc: Zola Button, [place] Congress, *

## 2023-06-07 ENCOUNTER — Ambulatory Visit: Payer: 59

## 2023-06-10 ENCOUNTER — Ambulatory Visit: Payer: Self-pay | Admitting: Surgery

## 2023-06-10 ENCOUNTER — Telehealth: Payer: Self-pay

## 2023-06-10 NOTE — Telephone Encounter (Signed)
 Contacted patient and left a voicemail to return call regarding stool test results.

## 2023-06-13 ENCOUNTER — Telehealth: Payer: Self-pay

## 2023-06-13 NOTE — Telephone Encounter (Signed)
Error

## 2023-06-13 NOTE — Telephone Encounter (Signed)
 Contacted patient and patient verbalized understanding of negative [facility] h pylori stool test.

## 2023-07-01 ENCOUNTER — Encounter: Payer: Self-pay | Admitting: Gastroenterology

## 2023-07-13 NOTE — Progress Notes (Signed)
 Agree with the assessment and plan as outlined by Boone Master, PA-C.  Nimrat Woolworth, DO, [facility]

## 2023-07-29 NOTE — Pre-Procedure Instructions (Signed)
 Surgical Instructions    Your procedure is scheduled on Thursday, April 24th, 2025.   Report to [facility] Main Entrance "A" at 0830 A.M., then check in with the Admitting office.  Call this number if you have problems the morning of surgery:  [phone]  If you have any questions prior to your surgery date call [phone]: Open Monday-Friday 8am-4pm If you experience any cold or flu symptoms such as cough, fever, chills, shortness of breath, etc. between now and your scheduled surgery, please notify us at the above number.     Remember:  Do not eat or drink after midnight the night before your surgery   Take these medicines the morning of surgery with A SIP OF WATER:             amLODipine (NORVASC)             famotidine (PEPCID)              fluticasone (FLONASE)             omeprazole (PRILOSEC)              RESTASIS eye drops  Please follow your primary care physician's instructions on when to stop hydroxychloroquine (PLAQUENIL.)   As of today, STOP taking any Aspirin (unless otherwise instructed by your surgeon) Aleve, Naproxen, Ibuprofen, Motrin, Advil, Goody's, BC's, all herbal medications, fish oil, and all vitamins.                     Do NOT Smoke (Tobacco/Vaping) for 24 hours prior to your procedure.  If you use a CPAP at night, you may bring your mask/headgear for your overnight stay.   Contacts, glasses, piercing's, hearing aid's, dentures or partials may not be worn into surgery, please bring cases for these belongings.    For patients admitted to the hospital, discharge time will be determined by your treatment team.   Patients discharged the day of surgery will not be allowed to drive home, and someone needs to stay with them for 24 hours.  SURGICAL WAITING ROOM VISITATION Patients having surgery or a procedure may have no more than 2 support people in the waiting area - these visitors may rotate.   Children under the age of 73 must have an adult with them  who is not the patient. If the patient needs to stay at the hospital during part of their recovery, the visitor guidelines for inpatient rooms apply. Pre-op nurse will coordinate an appropriate time for 1 support person to accompany patient in pre-op.  This support person may not rotate.   Please refer to the [facility] website for the visitor guidelines for Inpatients (after your surgery is over and you are in a regular room).    Special instructions:   [place]- Preparing For Surgery  Before surgery, you can play an important role. Because skin is not sterile, your skin needs to be as free of germs as possible. You can reduce the number of germs on your skin by washing with CHG (chlorahexidine gluconate) Soap before surgery.  CHG is an antiseptic cleaner which kills germs and bonds with the skin to continue killing germs even after washing.    Oral Hygiene is also important to reduce your risk of infection.  Remember - BRUSH YOUR TEETH THE MORNING OF SURGERY WITH YOUR REGULAR TOOTHPASTE  Please do not use if you have an allergy to CHG or antibacterial soaps. If your skin becomes reddened/irritated stop using  the CHG.  Do not shave (including legs and underarms) for at least 48 hours prior to first CHG shower. It is OK to shave your face.  Please follow these instructions carefully.   Shower the NIGHT BEFORE SURGERY and the MORNING OF SURGERY  If you chose to wash your hair, wash your hair first as usual with your normal shampoo.  After you shampoo, rinse your hair and body thoroughly to remove the shampoo.  Use CHG Soap as you would any other liquid soap. You can apply CHG directly to the skin and wash gently with a scrungie or a clean washcloth.   Apply the CHG Soap to your body ONLY FROM THE NECK DOWN.  Do not use on open wounds or open sores. Avoid contact with your eyes, ears, mouth and genitals (private parts). Wash Face and genitals (private parts)  with your normal soap.    Wash thoroughly, paying special attention to the area where your surgery will be performed.  Thoroughly rinse your body with warm water from the neck down.  DO NOT shower/wash with your normal soap after using and rinsing off the CHG Soap.  Pat yourself dry with a CLEAN TOWEL.  Wear CLEAN PAJAMAS to bed the night before surgery  Place CLEAN SHEETS on your bed the night before your surgery  DO NOT SLEEP WITH PETS.   Day of Surgery: Take a shower with CHG soap. Do not wear jewelry or makeup Do not wear lotions, powders, perfumes/colognes, or deodorant. Do not shave 48 hours prior to surgery.  Men may shave face and neck. Do not bring valuables to the hospital.  [facility] is not responsible for any belongings or valuables. Do not wear nail polish, gel polish, artificial nails, or any other type of covering on natural nails (fingers and toes) If you have artificial nails or gel coating that need to be removed by a nail salon, please have this removed prior to surgery. Artificial nails or gel coating may interfere with anesthesia's ability to adequately monitor your vital signs. Wear Clean/Comfortable clothing the morning of surgery Remember to brush your teeth WITH YOUR REGULAR TOOTHPASTE.   Please read over the following fact sheets that you were given.    If you received a COVID test during your pre-op visit  it is requested that you wear a mask when out in public, stay away from anyone that may not be feeling well and notify your surgeon if you develop symptoms. If you have been in contact with anyone that has tested positive in the last 10 days please notify you surgeon.

## 2023-07-31 NOTE — Progress Notes (Unsigned)
 Chief Complaint: Follow-up Primary GI MD: Dr. Karene Oto  HPI: Discussed the use of AI scribe software for clinical note transcription with the patient, who gave verbal consent to proceed.  History of Present Illness [name] is a 63 year old female who presents with upper abdominal pain.  She experiences upper abdominal pain that worsens with eating. The pain is mild, rated at two to three on a pain scale. She has attempted to identify specific food triggers but finds that nearly all foods, including water, exacerbate the pain. No other associated symptoms are reported. She was seenin feb 2024 for same and is schedule for cholecystectomy next week  An ultrasound previously revealed gallbladder sludge, and her liver enzymes were elevated at that time. However, her liver enzymes have since returned to normal as of her last check in February.  She is scheduled for gallbladder removal surgery on August 11, 2023. She feels nervous about the upcoming procedure but is hopeful for relief from her symptoms.    PREVIOUS GI WORKUP   Colonoscopy 08/03/2019 for screening - Two 2 to 4 mm polyps in the transverse colon and in the ascending colon, removed with a cold snare. Resected and retrieved.  - Diverticulosis in the sigmoid colon, in the transverse colon and in the ascending colon. - Repeat 5 years (07/2024)   Surgical [P], colon, transverse and ascending, polyp (2) - TUBULAR ADENOMA. - NO HIGH GRADE DYSPLASIA OR CARCINOMA. - SESSILE SERRATED POLYP WITHOUT CYTOLOGIC DYSPLASIA.   EGD 10/2009 for GERD - Normal EGD - Biopsies negative for H. pylori, EOE, intestinal metaplasia, dysplasia, or malignancy  Past Medical History:  Diagnosis Date   Abdominal pain, other specified site    Acute upper respiratory infections of unspecified site    Allergy    seasonal   Anemia    Benign neoplasm of skin, site unspecified    Disturbance of skin sensation    Diverticulosis of colon (without  mention of hemorrhage)    Family history of diabetes mellitus    Family history of ischemic heart disease    Family history of malignant neoplasm of genital organ, other    GERD (gastroesophageal reflux disease)    Hypertension    Hypokalemia    Left shoulder pain    Low blood potassium    Post-menopausal    Rheumatoid arthritis (HCC)    Sleep apnea    wears cpap    Sprain and strain of unspecified site of knee and leg    Sprain of neck    Thyroid disease    Unspecified essential hypertension     Past Surgical History:  Procedure Laterality Date   ABDOMINAL HYSTERECTOMY     BREAST BIOPSY Right 10/30/2021   BREAST LUMPECTOMY WITH RADIOACTIVE SEED LOCALIZATION Right 12/30/2021   Procedure: RIGHT BREAST LUMPECTOMY WITH RADIOACTIVE SEED LOCALIZATION;  Surgeon: Sim Dryer, MD;  Location: [facility];  Service: General;  Laterality: Right;   BUNIONECTOMY  2005   right foot   COLONOSCOPY  6-12/2008   SHOULDER SURGERY     left;spurs 2-09- dr Felicie Horning   TOTAL ABDOMINAL HYSTERECTOMY W/ BILATERAL SALPINGOOPHORECTOMY  2002    Current Outpatient Medications  Medication Sig Dispense Refill   amLODipine (NORVASC) 10 MG tablet Take 1 tablet (10 mg total) by mouth daily. 90 tablet 3   Cholecalciferol (VITAMIN D) 50 MCG (2000 UT) CAPS Take 1 capsule (2,000 Units total) by mouth daily. 30 capsule 0   estradiol (ESTRACE) 2 MG tablet  Take 1 mg by mouth as needed.     fluticasone (FLONASE) 50 MCG/ACT nasal spray INSTILL TWO SPRAYS INTO BOTH NOSTRIL DAILY. 16 g 2   furosemide (LASIX) 40 MG tablet 1 every 3 days if needed. 30 tablet 0   hydrochlorothiazide (HYDRODIURIL) 25 MG tablet Take 1 tablet (25 mg total) by mouth daily. 90 tablet 3   hydroxychloroquine (PLAQUENIL) 200 MG tablet Take 200 mg by mouth 2 (two) times daily.     levothyroxine (SYNTHROID) 50 MCG tablet Take 1 tablet (50 mcg total) by mouth daily. 90 tablet 3   losartan (COZAAR) 100 MG tablet Take 1 tablet (100 mg  total) by mouth daily. 90 tablet 3   naproxen sodium (ALEVE) 220 MG tablet Take 220 mg by mouth.     omeprazole (PRILOSEC) 20 MG capsule Take 1 capsule (20 mg total) by mouth daily. 90 capsule 1   potassium chloride (KLOR-CON M) 10 MEQ tablet Take 2 tablets (20 mEq total) by mouth 2 (two) times daily. 120 tablet 5   RESTASIS 0.05 % ophthalmic emulsion Place 1 drop into both eyes daily.     famotidine (PEPCID) 20 MG tablet Take 1 tablet (20 mg total) by mouth 2 (two) times daily. (Patient not taking: Reported on 08/01/2023) 60 tablet 2   No current facility-administered medications for this visit.    Allergies as of 08/01/2023   (No Known Allergies)    Family History  Problem Relation Age of Onset   Diabetes Mother    Hypertension Mother    Dementia Mother    Stroke Mother    Prostate cancer Father    Hypertension Father    Cancer Father    Hypertension Sister    Diabetes Brother    Stroke Maternal Aunt    Breast cancer Maternal Aunt    Breast cancer Cousin    Colon cancer Neg Hx    Colon polyps Neg Hx    Esophageal cancer Neg Hx    Rectal cancer Neg Hx    Stomach cancer Neg Hx     Social History   Socioeconomic History   Marital status: Married    Spouse name: Not on file   Number of children: 0   Years of education: Not on file   Highest education level: Associate degree: occupational, Scientist, product/process development, or vocational program  Occupational History   Occupation: Forensic scientist CLERK    Employer: Designer, jewell[employer]   Occupation: retired    Comment: 03/18/2021  Tobacco Use   Smoking status: Never   Smokeless tobacco: Never  Substance and Sexual Activity   Alcohol use: No   Drug use: No   Sexual activity: Yes    Partners: Male  Other Topics Concern   Not on file  Social History Narrative   Exercise -- walking 15 min x 3 a day    Social Drivers of Health   Financial Resource Strain: Low Risk  (05/05/2023)   Overall Financial Resource Strain (CARDIA)    Difficulty of Paying  Living Expenses: Not hard at all  Food Insecurity: No Food Insecurity (05/05/2023)   Hunger Vital Sign    Worried About Running Out of Food in the Last Year: Never true    Ran Out of Food in the Last Year: Never true  Transportation Needs: No Transportation Needs (05/05/2023)   PRAPARE - Administrator, Civil Service (Medical): No    Lack of Transportation (Non-Medical): No  Physical Activity: Insufficiently Active (05/05/2023)   Exercise  Vital Sign    Days of Exercise per Week: 5 days    Minutes of Exercise per Session: 10 min  Stress: No Stress Concern Present (05/05/2023)   [employer] of Occupational Health - Occupational Stress Questionnaire    Feeling of Stress : Only a little  Social Connections: Moderately Integrated (05/05/2023)   Social Connection and Isolation Panel [NHANES]    Frequency of Communication with Friends and Family: More than three times a week    Frequency of Social Gatherings with Friends and Family: Once a week    Attends Religious Services: More than 4 times per year    Active Member of [employer] or Organizations: No    Attends Engineer, structural: Not on file    Marital Status: Married  Catering manager Violence: Not on file    Review of Systems:    Constitutional: No weight loss, fever, chills, weakness or fatigue HEENT: Eyes: No change in vision               Ears, Nose, Throat:  No change in hearing or congestion Skin: No rash or itching Cardiovascular: No chest pain, chest pressure or palpitations   Respiratory: No SOB or cough Gastrointestinal: See HPI and otherwise negative Genitourinary: No dysuria or change in urinary frequency Neurological: No headache, dizziness or syncope Musculoskeletal: No new muscle or joint pain Hematologic: No bleeding or bruising Psychiatric: No history of depression or anxiety    Physical Exam:  Vital signs: BP (!) 150/90 (BP Location: Left Wrist, Patient Position: Sitting, Cuff Size: Normal)    Pulse 72   Ht 5' 7.25" (1.708 m) Comment: height measured without shoes  Wt 298 lb (135.2 kg)   BMI 46.33 kg/m   Constitutional: NAD, Well developed, Well nourished, alert and cooperative Head:  Normocephalic and atraumatic. Eyes:   PEERL, EOMI. No icterus. Conjunctiva pink. Respiratory: Respirations even and unlabored. Lungs clear to auscultation bilaterally.   No wheezes, crackles, or rhonchi.  Cardiovascular:  Regular rate and rhythm. No peripheral edema, cyanosis or pallor.  Gastrointestinal:  Soft, nondistended, RUQ tenderness. No rebound or guarding. Normal bowel sounds. No appreciable masses or hepatomegaly. Rectal:  Not performed.  Msk:  Symmetrical without gross deformities. Without edema, no deformity or joint abnormality.  Neurologic:  Alert and  oriented x4;  grossly normal neurologically.  Skin:   Dry and intact without significant lesions or rashes. Psychiatric: Oriented to person, place and time. Demonstrates good judgement and reason without abnormal affect or behaviors.   RELEVANT LABS AND IMAGING: CBC    Component Value Date/Time   WBC 5.1 06/01/2023 0927   RBC 4.48 06/01/2023 0927   HGB 12.8 06/01/2023 0927   HGB 11.8 02/21/2019 1045   HCT 38.6 06/01/2023 0927   HCT 35.4 02/21/2019 1045   PLT 288.0 06/01/2023 0927   PLT 183 02/21/2019 1045   MCV 86.1 06/01/2023 0927   MCV 86 02/21/2019 1045   MCH 28.7 02/21/2019 1045   MCH 27.1 08/02/2011 1839   MCHC 33.3 06/01/2023 0927   RDW 13.5 06/01/2023 0927   RDW 12.9 02/21/2019 1045   LYMPHSABS 1.3 06/01/2023 0927   LYMPHSABS 1.3 02/21/2019 1045   MONOABS 0.4 06/01/2023 0927   EOSABS 0.2 06/01/2023 0927   EOSABS 0.3 02/21/2019 1045   BASOSABS 0.1 06/01/2023 0927   BASOSABS 0.1 02/21/2019 1045    CMP     Component Value Date/Time   NA 139 06/01/2023 0927   NA 143 07/12/2019 1656   K  3.7 06/01/2023 0927   CL 102 06/01/2023 0927   CO2 29 06/01/2023 0927   GLUCOSE 85 06/01/2023 0927   GLUCOSE 74  03/03/2006 1039   BUN 19 06/01/2023 0927   BUN 17 07/12/2019 1656   CREATININE 1.12 06/01/2023 0927   CALCIUM 9.0 06/01/2023 0927   PROT 7.4 06/01/2023 0927   PROT 6.2 07/12/2019 1656   ALBUMIN 3.9 06/01/2023 0927   ALBUMIN 3.8 07/12/2019 1656   AST 14 06/01/2023 0927   ALT 13 06/01/2023 0927   ALKPHOS 70 06/01/2023 0927   BILITOT 0.9 06/01/2023 0927   BILITOT 0.5 07/12/2019 1656   GFRNONAA 65 07/12/2019 1656   GFRAA 75 07/12/2019 1656     Assessment/Plan:    Epigastric pain/RUQ pain Epigastric pain worse with eating.  Gallbladder sludge on ultrasound.  Elevated LFTs since January 2025 with normalization Feb 2025 likely secondary to gallbladder sludge.  Scheduled for cholecystectomy 08/11/2023 -- proceed with cholecystectomy next week -- if persistent symptoms after surgery, please let us  know. Will consider EGD for further evaluation  Hepatic steatosis Identified on ultrasound. -Continue monitoring. Normal LFTs feb 2025  Suzanna Erp, [facility] 08/01/2023, 9:48 AM  Cc: Crecencio Dodge, Candida Chalk, *

## 2023-08-01 ENCOUNTER — Ambulatory Visit: Payer: 59 | Admitting: Gastroenterology

## 2023-08-01 ENCOUNTER — Inpatient Hospital Stay (HOSPITAL_COMMUNITY)
Admission: RE | Admit: 2023-08-01 | Discharge: 2023-08-01 | Disposition: A | Discharge status: Home / Self Care | Source: Ambulatory Visit

## 2023-08-01 ENCOUNTER — Encounter: Payer: Self-pay | Admitting: Gastroenterology

## 2023-08-01 VITALS — BP 150/90 | HR 72 | Ht 67.25 in | Wt 298.0 lb

## 2023-08-01 DIAGNOSIS — K828 Other specified diseases of gallbladder: Secondary | ICD-10-CM

## 2023-08-01 DIAGNOSIS — K760 Fatty (change of) liver, not elsewhere classified: Secondary | ICD-10-CM

## 2023-08-01 DIAGNOSIS — K8050 Calculus of bile duct without cholangitis or cholecystitis without obstruction: Secondary | ICD-10-CM

## 2023-08-01 DIAGNOSIS — R1013 Epigastric pain: Secondary | ICD-10-CM | POA: Diagnosis not present

## 2023-08-01 NOTE — Pre-Procedure Instructions (Signed)
 Surgical Instructions    Your procedure is scheduled on Thursday, April 24th, 2025.   Report to [facility] Main Entrance "A" at 0830 A.M., then check in with the Admitting office.  Call this number if you have problems the morning of surgery:  [phone]  If you have any questions prior to your surgery date call [phone]: Open Monday-Friday 8am-4pm If you experience any cold or flu symptoms such as cough, fever, chills, shortness of breath, etc. between now and your scheduled surgery, please notify us at the above number.     Remember:  Do not eat or drink after midnight the night before your surgery   Take these medicines the morning of surgery with A SIP OF WATER:             amLODipine (NORVASC)             famotidine (PEPCID)              fluticasone (FLONASE)             omeprazole (PRILOSEC)              RESTASIS eye drops   As of today, STOP taking any Aspirin (unless otherwise instructed by your surgeon) Aleve, Naproxen, Ibuprofen, Motrin, Advil, Goody's, BC's, all herbal medications, fish oil, and all vitamins.                     Do NOT Smoke (Tobacco/Vaping) for 24 hours prior to your procedure.  If you use a CPAP at night, you may bring your mask/headgear for your overnight stay.   Contacts, glasses, piercing's, hearing aid's, dentures or partials may not be worn into surgery, please bring cases for these belongings.    For patients admitted to the hospital, discharge time will be determined by your treatment team.   Patients discharged the day of surgery will not be allowed to drive home, and someone needs to stay with them for 24 hours.  SURGICAL WAITING ROOM VISITATION Patients having surgery or a procedure may have no more than 2 support people in the waiting area - these visitors may rotate.   Children under the age of 39 must have an adult with them who is not the patient. If the patient needs to stay at the hospital during part of their recovery, the  visitor guidelines for inpatient rooms apply. Pre-op nurse will coordinate an appropriate time for 1 support person to accompany patient in pre-op.  This support person may not rotate.   Please refer to the [facility] website for the visitor guidelines for Inpatients (after your surgery is over and you are in a regular room).    Special instructions:   [facility]- Preparing For Surgery  Before surgery, you can play an important role. Because skin is not sterile, your skin needs to be as free of germs as possible. You can reduce the number of germs on your skin by washing with CHG (chlorahexidine gluconate) Soap before surgery.  CHG is an antiseptic cleaner which kills germs and bonds with the skin to continue killing germs even after washing.    Oral Hygiene is also important to reduce your risk of infection.  Remember - BRUSH YOUR TEETH THE MORNING OF SURGERY WITH YOUR REGULAR TOOTHPASTE  Please do not use if you have an allergy to CHG or antibacterial soaps. If your skin becomes reddened/irritated stop using the CHG.  Do not shave (including legs and underarms) for at least 48  hours prior to first CHG shower. It is OK to shave your face.  Please follow these instructions carefully.   Shower the NIGHT BEFORE SURGERY and the MORNING OF SURGERY  If you chose to wash your hair, wash your hair first as usual with your normal shampoo.  After you shampoo, rinse your hair and body thoroughly to remove the shampoo.  Use CHG Soap as you would any other liquid soap. You can apply CHG directly to the skin and wash gently with a scrungie or a clean washcloth.   Apply the CHG Soap to your body ONLY FROM THE NECK DOWN.  Do not use on open wounds or open sores. Avoid contact with your eyes, ears, mouth and genitals (private parts). Wash Face and genitals (private parts)  with your normal soap.   Wash thoroughly, paying special attention to the area where your surgery will be performed.  Thoroughly  rinse your body with warm water from the neck down.  DO NOT shower/wash with your normal soap after using and rinsing off the CHG Soap.  Pat yourself dry with a CLEAN TOWEL.  Wear CLEAN PAJAMAS to bed the night before surgery  Place CLEAN SHEETS on your bed the night before your surgery  DO NOT SLEEP WITH PETS.   Day of Surgery: Take a shower with CHG soap. Do not wear jewelry or makeup Do not wear lotions, powders, perfumes/colognes, or deodorant. Do not shave 48 hours prior to surgery.  Men may shave face and neck. Do not bring valuables to the hospital.  [facility] is not responsible for any belongings or valuables. Do not wear nail polish, gel polish, artificial nails, or any other type of covering on natural nails (fingers and toes) If you have artificial nails or gel coating that need to be removed by a nail salon, please have this removed prior to surgery. Artificial nails or gel coating may interfere with anesthesia's ability to adequately monitor your vital signs. Wear Clean/Comfortable clothing the morning of surgery Remember to brush your teeth WITH YOUR REGULAR TOOTHPASTE.   Please read over the following fact sheets that you were given.    If you received a COVID test during your pre-op visit  it is requested that you wear a mask when out in public, stay away from anyone that may not be feeling well and notify your surgeon if you develop symptoms. If you have been in contact with anyone that has tested positive in the last 10 days please notify you surgeon.

## 2023-08-03 ENCOUNTER — Encounter (HOSPITAL_COMMUNITY)
Admission: RE | Admit: 2023-08-03 | Discharge: 2023-08-03 | Disposition: A | Discharge status: Home / Self Care | Source: Ambulatory Visit | Attending: Surgery | Admitting: Surgery

## 2023-08-03 ENCOUNTER — Other Ambulatory Visit: Payer: Self-pay

## 2023-08-03 ENCOUNTER — Encounter (HOSPITAL_COMMUNITY): Payer: Self-pay

## 2023-08-03 VITALS — BP 160/84 | HR 69 | Temp 98.3°F | Resp 17 | Ht 68.0 in | Wt 297.2 lb

## 2023-08-03 DIAGNOSIS — Z01812 Encounter for preprocedural laboratory examination: Secondary | ICD-10-CM | POA: Diagnosis present

## 2023-08-03 DIAGNOSIS — Z01818 Encounter for other preprocedural examination: Secondary | ICD-10-CM

## 2023-08-03 HISTORY — DX: Hypothyroidism, unspecified: E03.9

## 2023-08-03 LAB — CBC
HCT: 38.4 % (ref 36.0–46.0)
Hemoglobin: 12.4 g/dL (ref 12.0–15.0)
MCH: 28.4 pg (ref 26.0–34.0)
MCHC: 32.3 g/dL (ref 30.0–36.0)
MCV: 88.1 fL (ref 80.0–100.0)
Platelets: 242 10*3/uL (ref 150–400)
RBC: 4.36 MIL/uL (ref 3.87–5.11)
RDW: 13.9 % (ref 11.5–15.5)
WBC: 4.3 10*3/uL (ref 4.0–10.5)
nRBC: 0 % (ref 0.0–0.2)

## 2023-08-03 LAB — BASIC METABOLIC PANEL WITH GFR
Anion gap: 7 (ref 5–15)
BUN: 13 mg/dL (ref 8–23)
CO2: 26 mmol/L (ref 22–32)
Calcium: 8.7 mg/dL — ABNORMAL LOW (ref 8.9–10.3)
Chloride: 108 mmol/L (ref 98–111)
Creatinine, Ser: 1.01 mg/dL — ABNORMAL HIGH (ref 0.44–1.00)
GFR, Estimated: >60 mL/min (ref >60)
Glucose, Bld: 82 mg/dL (ref 70–99)
Potassium: 4.3 mmol/L (ref 3.5–5.1)
Sodium: 141 mmol/L (ref 135–145)

## 2023-08-03 NOTE — Progress Notes (Signed)
 PCP - Adelfa Adolph Chase,DO Cardiologist - denies  PPM/ICD - denies Device Orders -  Rep Notified -   Chest x-ray - na EKG - 03/05/24 Stress Test - denies ECHO - 12/06/13 Cardiac Cath - denies  Sleep Study - 03/22/2019 CPAP - pt does not use her CPAP; states she cannot sleep with the mask.   Fasting Blood Sugar - na Checks Blood Sugar _____ times a day  Last dose of GLP1 agonist-  na GLP1 instructions:   Blood Thinner Instructions:na Aspirin Instructions:na  ERAS Protcol -no PRE-SURGERY Ensure or G2-   COVID TEST- na   Anesthesia review: na  Patient denies shortness of breath, fever, cough and chest pain at PAT appointment   All instructions explained to the patient, with a verbal understanding of the material. Patient agrees to go over the instructions while at home for a better understanding. Patient stated that she was not taking some of her medications because she had run out and she was waiting for her appointment with her PCP which is in 6 months to get them refilled. I encouraged her to call her PCP after this appointment to ask for the prescriptions to be refilled as she is taking blood pressure medication and Synthroid. She stated that she would call today. Patient also instructed to wear a mask when out in public prior to surgery.  The opportunity to ask questions was provided.

## 2023-08-03 NOTE — Progress Notes (Signed)
 Surgical Instructions   Your procedure is scheduled on Thursday April 24. Report to [facility] Main Entrance "A" at 8:30 A.M., then check in with the Admitting office. Any questions or running late day of surgery: call [phone]  Questions prior to your surgery date: call [phone], Monday-Friday, 8am-4pm. If you experience any cold or flu symptoms such as cough, fever, chills, shortness of breath, etc. between now and your scheduled surgery, please notify us at the above number.     Remember:  Do not eat or drink anything after midnight the night before your surgery.    Take these medicines the morning of surgery with A SIP OF WATER  amLODipine (NORVASC)  levothyroxine (SYNTHROID)  Polyethyl Glycol-Propyl Glycol (SYSTANE OP)     One week prior to surgery, STOP taking any Aspirin (unless otherwise instructed by your surgeon) Aleve, Naproxen, Ibuprofen, Motrin, Advil, Goody's, BC's, all herbal medications, fish oil, and non-prescription vitamins.                     Do NOT Smoke (Tobacco/Vaping) for 24 hours prior to your procedure.  If you use a CPAP at night, you may bring your mask/headgear for your overnight stay.   You will be asked to remove any contacts, glasses, piercing's, hearing aid's, dentures/partials prior to surgery. Please bring cases for these items if needed.    Patients discharged the day of surgery will not be allowed to drive home, and someone needs to stay with them for 24 hours.  SURGICAL WAITING ROOM VISITATION Patients may have no more than 2 support people in the waiting area - these visitors may rotate.   Pre-op nurse will coordinate an appropriate time for 1 ADULT support person, who may not rotate, to accompany patient in pre-op.  Children under the age of 5 must have an adult with them who is not the patient and must remain in the main waiting area with an adult.  If the patient needs to stay at the hospital during part of their recovery, the  visitor guidelines for inpatient rooms apply.  Please refer to the [facility] website for the visitor guidelines for any additional information.   If you received a COVID test during your pre-op visit  it is requested that you wear a mask when out in public, stay away from anyone that may not be feeling well and notify your surgeon if you develop symptoms. If you have been in contact with anyone that has tested positive in the last 10 days please notify you surgeon.      Pre-operative CHG Bathing Instructions   You can play a key role in reducing the risk of infection after surgery. Your skin needs to be as free of germs as possible. You can reduce the number of germs on your skin by washing with CHG (chlorhexidine gluconate) soap before surgery. CHG is an antiseptic soap that kills germs and continues to kill germs even after washing.   DO NOT use if you have an allergy to chlorhexidine/CHG or antibacterial soaps. If your skin becomes reddened or irritated, stop using the CHG and notify one of our RNs at [phone].              TAKE A SHOWER THE NIGHT BEFORE SURGERY AND THE DAY OF SURGERY    Please keep in mind the following:  DO NOT shave, including legs and underarms, 48 hours prior to surgery.   You may shave your face before/day of surgery.  Place clean sheets on your bed the night before surgery Use a clean washcloth (not used since being washed) for each shower. DO NOT sleep with pet's night before surgery.  CHG Shower Instructions:  Wash your face and private area with normal soap. If you choose to wash your hair, wash first with your normal shampoo.  After you use shampoo/soap, rinse your hair and body thoroughly to remove shampoo/soap residue.  Turn the water OFF and apply half the bottle of CHG soap to a CLEAN washcloth.  Apply CHG soap ONLY FROM YOUR NECK DOWN TO YOUR TOES (washing for 3-5 minutes)  DO NOT use CHG soap on face, private areas, open wounds, or sores.  Pay  special attention to the area where your surgery is being performed.  If you are having back surgery, having someone wash your back for you may be helpful. Wait 2 minutes after CHG soap is applied, then you may rinse off the CHG soap.  Pat dry with a clean towel  Put on clean pajamas    Additional instructions for the day of surgery: DO NOT APPLY any lotions, deodorants, cologne, or perfumes.   Do not wear jewelry or makeup Do not wear nail polish, gel polish, artificial nails, or any other type of covering on natural nails (fingers and toes) Do not bring valuables to the hospital. [facility] is not responsible for valuables/personal belongings. Put on clean/comfortable clothes.  Please brush your teeth.  Ask your nurse before applying any prescription medications to the skin.

## 2023-08-11 ENCOUNTER — Ambulatory Visit (HOSPITAL_COMMUNITY)
Admission: RE | Admit: 2023-08-11 | Discharge: 2023-08-11 | Disposition: A | Discharge status: Home / Self Care | Attending: Surgery | Admitting: Surgery

## 2023-08-11 ENCOUNTER — Ambulatory Visit (HOSPITAL_COMMUNITY)

## 2023-08-11 ENCOUNTER — Other Ambulatory Visit: Payer: Self-pay

## 2023-08-11 ENCOUNTER — Encounter (HOSPITAL_COMMUNITY)
Admission: RE | Disposition: A | Discharge status: Home / Self Care | Payer: Self-pay | Source: Home / Self Care | Attending: Surgery

## 2023-08-11 ENCOUNTER — Ambulatory Visit (HOSPITAL_BASED_OUTPATIENT_CLINIC_OR_DEPARTMENT_OTHER)

## 2023-08-11 ENCOUNTER — Encounter (HOSPITAL_COMMUNITY): Payer: Self-pay | Admitting: Surgery

## 2023-08-11 DIAGNOSIS — D649 Anemia, unspecified: Secondary | ICD-10-CM | POA: Diagnosis not present

## 2023-08-11 DIAGNOSIS — K8010 Calculus of gallbladder with chronic cholecystitis without obstruction: Secondary | ICD-10-CM | POA: Diagnosis not present

## 2023-08-11 DIAGNOSIS — E039 Hypothyroidism, unspecified: Secondary | ICD-10-CM | POA: Insufficient documentation

## 2023-08-11 DIAGNOSIS — M1990 Unspecified osteoarthritis, unspecified site: Secondary | ICD-10-CM | POA: Insufficient documentation

## 2023-08-11 DIAGNOSIS — K828 Other specified diseases of gallbladder: Secondary | ICD-10-CM | POA: Diagnosis not present

## 2023-08-11 DIAGNOSIS — Z79890 Hormone replacement therapy (postmenopausal): Secondary | ICD-10-CM | POA: Diagnosis not present

## 2023-08-11 DIAGNOSIS — E66813 Obesity, class 3: Secondary | ICD-10-CM | POA: Diagnosis not present

## 2023-08-11 DIAGNOSIS — Z79899 Other long term (current) drug therapy: Secondary | ICD-10-CM | POA: Diagnosis not present

## 2023-08-11 DIAGNOSIS — G4730 Sleep apnea, unspecified: Secondary | ICD-10-CM | POA: Diagnosis not present

## 2023-08-11 DIAGNOSIS — K219 Gastro-esophageal reflux disease without esophagitis: Secondary | ICD-10-CM | POA: Insufficient documentation

## 2023-08-11 DIAGNOSIS — Z6841 Body Mass Index (BMI) 40.0 and over, adult: Secondary | ICD-10-CM | POA: Diagnosis not present

## 2023-08-11 DIAGNOSIS — E669 Obesity, unspecified: Secondary | ICD-10-CM | POA: Insufficient documentation

## 2023-08-11 DIAGNOSIS — K8050 Calculus of bile duct without cholangitis or cholecystitis without obstruction: Secondary | ICD-10-CM | POA: Diagnosis present

## 2023-08-11 DIAGNOSIS — I10 Essential (primary) hypertension: Secondary | ICD-10-CM | POA: Diagnosis not present

## 2023-08-11 HISTORY — PX: CHOLECYSTECTOMY: SHX55

## 2023-08-11 SURGERY — LAPAROSCOPIC CHOLECYSTECTOMY
Anesthesia: General | Site: Abdomen

## 2023-08-11 MED ORDER — ORAL CARE MOUTH RINSE: 15.0000 mL | Freq: Once | OROMUCOSAL | Status: AC

## 2023-08-11 MED ORDER — CHLORHEXIDINE GLUCONATE CLOTH 2 % EX PADS
6.0000 | MEDICATED_PAD | Freq: Once | CUTANEOUS | Status: DC
Start: 2023-08-11 — End: 2023-08-11

## 2023-08-11 MED ORDER — KETAMINE HCL 50 MG/5ML IJ SOSY
PREFILLED_SYRINGE | INTRAMUSCULAR | Status: AC
  Filled 2023-08-11: qty 5

## 2023-08-11 MED ORDER — HYDROMORPHONE HCL 1 MG/ML IJ SOLN
0.2500 mg | INTRAMUSCULAR | Status: DC | PRN
  Administered 2023-08-11: 0.25 mg via INTRAVENOUS
  Administered 2023-08-11 (×2): 0.5 mg via INTRAVENOUS
  Administered 2023-08-11: 0.25 mg via INTRAVENOUS

## 2023-08-11 MED ORDER — CELECOXIB 200 MG PO CAPS
ORAL_CAPSULE | ORAL | Status: AC
  Administered 2023-08-11: 200 mg via ORAL
  Filled 2023-08-11: qty 1

## 2023-08-11 MED ORDER — KETAMINE HCL 10 MG/ML IJ SOLN
INTRAMUSCULAR | Status: DC | PRN
  Administered 2023-08-11: 10 mg via INTRAVENOUS
  Administered 2023-08-11: 20 mg via INTRAVENOUS
  Administered 2023-08-11 (×2): 10 mg via INTRAVENOUS

## 2023-08-11 MED ORDER — CELECOXIB 200 MG PO CAPS
200.0000 mg | ORAL_CAPSULE | ORAL | Status: AC
Start: 2023-08-11 — End: 2023-08-11

## 2023-08-11 MED ORDER — HYDROMORPHONE HCL 1 MG/ML IJ SOLN
INTRAMUSCULAR | Status: AC
  Filled 2023-08-11: qty 1

## 2023-08-11 MED ORDER — GABAPENTIN 300 MG PO CAPS: 300.0000 mg | ORAL_CAPSULE | ORAL | Status: AC

## 2023-08-11 MED ORDER — IBUPROFEN 800 MG PO TABS: 800.0000 mg | ORAL_TABLET | Freq: Three times a day (TID) | ORAL | 0 refills | Status: DC | PRN

## 2023-08-11 MED ORDER — ROCURONIUM BROMIDE 10 MG/ML (PF) SYRINGE
PREFILLED_SYRINGE | INTRAVENOUS | Status: AC
  Filled 2023-08-11: qty 10

## 2023-08-11 MED ORDER — CHLORHEXIDINE GLUCONATE 0.12 % MT SOLN
OROMUCOSAL | Status: AC
  Administered 2023-08-11: 15 mL via OROMUCOSAL
  Filled 2023-08-11: qty 15

## 2023-08-11 MED ORDER — SODIUM CHLORIDE 0.9 % IR SOLN
Status: DC | PRN
  Administered 2023-08-11: 1000 mL

## 2023-08-11 MED ORDER — CHLORHEXIDINE GLUCONATE 0.12 % MT SOLN: 15.0000 mL | Freq: Once | OROMUCOSAL | Status: AC

## 2023-08-11 MED ORDER — ROCURONIUM BROMIDE 10 MG/ML (PF) SYRINGE
PREFILLED_SYRINGE | INTRAVENOUS | Status: DC | PRN
  Administered 2023-08-11: 90 mg via INTRAVENOUS

## 2023-08-11 MED ORDER — OXYCODONE HCL 5 MG PO TABS
5.0000 mg | ORAL_TABLET | Freq: Once | ORAL | Status: AC | PRN
  Administered 2023-08-11: 5 mg via ORAL

## 2023-08-11 MED ORDER — LIDOCAINE 2% (20 MG/ML) 5 ML SYRINGE
INTRAMUSCULAR | Status: DC | PRN
  Administered 2023-08-11: 100 mg via INTRAVENOUS

## 2023-08-11 MED ORDER — ONDANSETRON HCL 4 MG/2ML IJ SOLN
INTRAMUSCULAR | Status: AC
  Filled 2023-08-11: qty 2

## 2023-08-11 MED ORDER — ACETAMINOPHEN 500 MG PO TABS
ORAL_TABLET | ORAL | Status: AC
  Administered 2023-08-11: 1000 mg via ORAL
  Filled 2023-08-11: qty 2

## 2023-08-11 MED ORDER — CHLORHEXIDINE GLUCONATE CLOTH 2 % EX PADS: 6.0000 | MEDICATED_PAD | Freq: Once | CUTANEOUS | Status: DC

## 2023-08-11 MED ORDER — OXYCODONE HCL 5 MG/5ML PO SOLN: 5.0000 mg | Freq: Once | ORAL | Status: AC | PRN

## 2023-08-11 MED ORDER — AMISULPRIDE (ANTIEMETIC) 5 MG/2ML IV SOLN: 10.0000 mg | Freq: Once | INTRAVENOUS | Status: DC | PRN

## 2023-08-11 MED ORDER — ONDANSETRON HCL 4 MG/2ML IJ SOLN
INTRAMUSCULAR | Status: DC | PRN
Start: 2023-08-11 — End: 2023-08-11
  Administered 2023-08-11: 4 mg via INTRAVENOUS

## 2023-08-11 MED ORDER — LIDOCAINE 2% (20 MG/ML) 5 ML SYRINGE
INTRAMUSCULAR | Status: AC
  Filled 2023-08-11: qty 5

## 2023-08-11 MED ORDER — DEXAMETHASONE SODIUM PHOSPHATE 10 MG/ML IJ SOLN
INTRAMUSCULAR | Status: DC | PRN
  Administered 2023-08-11: 10 mg via INTRAVENOUS

## 2023-08-11 MED ORDER — FENTANYL CITRATE (PF) 250 MCG/5ML IJ SOLN
INTRAMUSCULAR | Status: DC | PRN
  Administered 2023-08-11: 150 ug via INTRAVENOUS

## 2023-08-11 MED ORDER — BUPIVACAINE-EPINEPHRINE 0.25% -1:200000 IJ SOLN
INTRAMUSCULAR | Status: DC | PRN
  Administered 2023-08-11: 5 mL

## 2023-08-11 MED ORDER — ACETAMINOPHEN 500 MG PO TABS: 1000.0000 mg | ORAL_TABLET | ORAL | Status: AC

## 2023-08-11 MED ORDER — ONDANSETRON HCL 4 MG/2ML IJ SOLN: 4.0000 mg | Freq: Once | INTRAMUSCULAR | Status: DC | PRN

## 2023-08-11 MED ORDER — LACTATED RINGERS IV SOLN: INTRAVENOUS | Status: DC

## 2023-08-11 MED ORDER — SUGAMMADEX SODIUM 200 MG/2ML IV SOLN
INTRAVENOUS | Status: DC | PRN
  Administered 2023-08-11: 400 mg via INTRAVENOUS

## 2023-08-11 MED ORDER — CEFAZOLIN SODIUM-DEXTROSE 3-4 GM/150ML-% IV SOLN
3.0000 g | INTRAVENOUS | Status: AC
  Administered 2023-08-11: 3 g via INTRAVENOUS
  Filled 2023-08-11: qty 150

## 2023-08-11 MED ORDER — PROPOFOL 10 MG/ML IV BOLUS
INTRAVENOUS | Status: DC | PRN
  Administered 2023-08-11: 200 mg via INTRAVENOUS

## 2023-08-11 MED ORDER — OXYCODONE HCL 5 MG PO TABS
ORAL_TABLET | ORAL | Status: AC
  Filled 2023-08-11: qty 1

## 2023-08-11 MED ORDER — DEXAMETHASONE SODIUM PHOSPHATE 10 MG/ML IJ SOLN
INTRAMUSCULAR | Status: AC
Start: 2023-08-11 — End: ?
  Filled 2023-08-11: qty 1

## 2023-08-11 MED ORDER — FENTANYL CITRATE (PF) 250 MCG/5ML IJ SOLN
INTRAMUSCULAR | Status: AC
  Filled 2023-08-11: qty 5

## 2023-08-11 MED ORDER — 0.9 % SODIUM CHLORIDE (POUR BTL) OPTIME
TOPICAL | Status: DC | PRN
  Administered 2023-08-11: 1000 mL

## 2023-08-11 MED ORDER — INDOCYANINE GREEN 25 MG IV SOLR
7.5000 mg | Freq: Once | INTRAVENOUS | Status: AC
  Administered 2023-08-11: 7.5 mg via INTRAVENOUS

## 2023-08-11 MED ORDER — MIDAZOLAM HCL 2 MG/2ML IJ SOLN
INTRAMUSCULAR | Status: AC
  Filled 2023-08-11: qty 2

## 2023-08-11 MED ORDER — MIDAZOLAM HCL 2 MG/2ML IJ SOLN
INTRAMUSCULAR | Status: DC | PRN
  Administered 2023-08-11: 2 mg via INTRAVENOUS

## 2023-08-11 MED ORDER — OXYCODONE HCL 5 MG PO TABS: 5.0000 mg | ORAL_TABLET | Freq: Four times a day (QID) | ORAL | 0 refills | Status: DC | PRN

## 2023-08-11 MED ORDER — GABAPENTIN 300 MG PO CAPS
ORAL_CAPSULE | ORAL | Status: AC
Start: 2023-08-11 — End: 2023-08-11
  Administered 2023-08-11: 300 mg via ORAL
  Filled 2023-08-11: qty 1

## 2023-08-11 SURGICAL SUPPLY — 32 items
BAG COUNTER SPONGE SURGICOUNT (BAG) ×1 IMPLANT
BLADE CLIPPER SURG (BLADE) IMPLANT
CANISTER SUCT 3000ML PPV (MISCELLANEOUS) ×1 IMPLANT
CHLORAPREP W/TINT 26 (MISCELLANEOUS) ×1 IMPLANT
CLIP APPLIE ROT 10 11.4 M/L (STAPLE) ×1 IMPLANT
COVER SURGICAL LIGHT HANDLE (MISCELLANEOUS) ×1 IMPLANT
DERMABOND ADVANCED .7 DNX12 (GAUZE/BANDAGES/DRESSINGS) ×1 IMPLANT
ELECTRODE REM PT RTRN 9FT ADLT (ELECTROSURGICAL) ×1 IMPLANT
GLOVE BIO SURGEON STRL SZ8 (GLOVE) ×1 IMPLANT
GLOVE BIOGEL PI IND STRL 8 (GLOVE) ×1 IMPLANT
GOWN STRL REUS W/ TWL LRG LVL3 (GOWN DISPOSABLE) ×2 IMPLANT
GOWN STRL REUS W/ TWL XL LVL3 (GOWN DISPOSABLE) ×1 IMPLANT
IRRIGATION SUCT STRKRFLW 2 WTP (MISCELLANEOUS) ×1 IMPLANT
KIT BASIN OR (CUSTOM PROCEDURE TRAY) ×1 IMPLANT
KIT IMAGING PINPOINTPAQ (MISCELLANEOUS) IMPLANT
KIT TURNOVER KIT B (KITS) ×1 IMPLANT
NS IRRIG 1000ML POUR BTL (IV SOLUTION) ×1 IMPLANT
PAD ARMBOARD POSITIONER FOAM (MISCELLANEOUS) ×1 IMPLANT
POUCH RETRIEVAL ECOSAC 10 (ENDOMECHANICALS) ×1 IMPLANT
SCISSORS LAP 5X35 DISP (ENDOMECHANICALS) ×1 IMPLANT
SET TUBE SMOKE EVAC HIGH FLOW (TUBING) ×1 IMPLANT
SLEEVE Z-THREAD 5X100MM (TROCAR) ×1 IMPLANT
SPECIMEN JAR SMALL (MISCELLANEOUS) ×1 IMPLANT
SUT MNCRL AB 4-0 PS2 18 (SUTURE) ×1 IMPLANT
TOWEL GREEN STERILE (TOWEL DISPOSABLE) ×1 IMPLANT
TOWEL GREEN STERILE FF (TOWEL DISPOSABLE) ×1 IMPLANT
TRAY LAPAROSCOPIC MC (CUSTOM PROCEDURE TRAY) ×1 IMPLANT
TROCAR 11X100 Z THREAD (TROCAR) ×1 IMPLANT
TROCAR BALLN 12MMX100 BLUNT (TROCAR) ×1 IMPLANT
TROCAR Z-THREAD OPTICAL 5X100M (TROCAR) ×1 IMPLANT
WARMER LAPAROSCOPE (MISCELLANEOUS) ×1 IMPLANT
WATER STERILE IRR 1000ML POUR (IV SOLUTION) ×1 IMPLANT

## 2023-08-11 NOTE — Anesthesia Procedure Notes (Signed)
 Procedure Name: Intubation Date/Time: 08/11/2023 11:14 AM  Performed by: Hebert Littler, CRNAPre-anesthesia Checklist: Patient identified, Emergency Drugs available, Suction available and Patient being monitored Patient Re-evaluated:Patient Re-evaluated prior to induction Oxygen Delivery Method: Circle System Utilized Preoxygenation: Pre-oxygenation with 100% oxygen Induction Type: IV induction Ventilation: Mask ventilation without difficulty Laryngoscope Size: Mac and 3 Grade View: Grade I Tube type: Oral Tube size: 7.0 mm Number of attempts: 1 Airway Equipment and Method: Stylet and Oral airway Placement Confirmation: ETT inserted through vocal cords under direct vision, positive ETCO2 and breath sounds checked- equal and bilateral Secured at: 22 cm Tube secured with: Tape Dental Injury: Teeth and Oropharynx as per pre-operative assessment

## 2023-08-11 NOTE — Transfer of Care (Signed)
 Immediate Anesthesia Transfer of Care Note  Patient: [name]  Procedure(s) Performed: LAPAROSCOPIC CHOLECYSTECTOMY (Abdomen)  Patient Location: PACU  Anesthesia Type:General  Level of Consciousness: awake  Airway & Oxygen Therapy: Patient Spontanous Breathing and Patient connected to face mask oxygen  Post-op Assessment: Report given to RN and Post -op Vital signs reviewed and stable  Post vital signs: Reviewed and stable  Last Vitals:  Vitals Value Taken Time  BP 135/72 08/11/23 1230  Temp    Pulse 74 08/11/23 1231  Resp 15 08/11/23 1231  SpO2 97 % 08/11/23 1231  Vitals shown include unfiled device data.  Last Pain:  Vitals:   08/11/23 0844  TempSrc:   PainSc: 0-No pain         Complications: No notable events documented.

## 2023-08-11 NOTE — H&P (Signed)
 History of Present Illness: [name] is a 63 y.o. female who is seen today as an office consultation for evaluation of New Consultation  Patient presents for evaluation of epigastric pain. She has had a 70-month history of postprandial epigastric pain. There is some associated nausea and vomiting. She has had 2 major attacks of pain radiating from her right upper quadrant of her abdomen to her chest. These have resolved. She has been worked up with ultrasound which showed sludge but certainly food seems to exacerbate the symptoms. She does have some nausea and vomiting from time to time with this as well.   Review of Systems: A complete review of systems was obtained from the patient. I have reviewed this information and discussed as appropriate with the patient. See HPI as well for other ROS.    Medical History: Past Medical History:  Diagnosis Date  Arthritis  GERD (gastroesophageal reflux disease)  Hypertension  Thyroid  disease   There is no problem list on file for this patient.  Past Surgical History:  Procedure Laterality Date  foot surgery  HYSTERECTOMY  shoulder surgery    No Known Allergies  Current Outpatient Medications on File Prior to Visit  Medication Sig Dispense Refill  amLODIPine  (NORVASC ) 10 MG tablet  cholecalciferol (VITAMIN D3) 2,000 unit capsule 1 tablet Orally Once a day  diclofenac  (VOLTAREN ) 75 MG EC tablet  estradioL (ESTRACE) 2 MG tablet Take 1 tablet by mouth once daily  fluticasone  propionate (FLONASE ) 50 mcg/actuation nasal spray  FUROsemide  (LASIX ) 40 MG tablet Take 1 tablet every day by oral route as needed for 30 days.  hydroCHLOROthiazide  (HYDRODIURIL ) 25 MG tablet  hydrOXYchloroQUINE  (PLAQUENIL ) 200 mg tablet  levothyroxine  (SYNTHROID ) 50 MCG tablet Take 1 tablet by mouth daily with breakfast  losartan  (COZAAR ) 100 MG tablet  naftifine  (NAFTIN ) 1 % cream Apply 1 Application! topically once daily  naproxen  sodium (ALEVE ) 220 MG tablet Take  1 tablet as needed by oral route.  omeprazole  (PRILOSEC) 20 MG DR capsule  potassium chloride  (KLOR-CON ) 10 mEq ER tablet  RESTASIS 0.05 % ophthalmic emulsion   No current facility-administered medications on file prior to visit.   Family History  Problem Relation Age of Onset  Skin cancer Mother  High blood pressure (Hypertension) Mother  Diabetes Mother  Skin cancer Other  Breast cancer Other    Social History   Tobacco Use  Smoking Status Never  Smokeless Tobacco Never    Social History   Socioeconomic History  Marital status: Married  Tobacco Use  Smoking status: Never  Smokeless tobacco: Never   Social Drivers of Corporate investment banker Strain: Low Risk (05/05/2023)  Received from [facility]  Overall Financial Resource Strain (CARDIA)  Difficulty of Paying Living Expenses: Not hard at all  Food Insecurity: No Food Insecurity (05/05/2023)  Received from [facility]  Hunger Vital Sign  Worried About Running Out of Food in the Last Year: Never true  Ran Out of Food in the Last Year: Never true  Transportation Needs: No Transportation Needs (05/05/2023)  Received from [facility] - Transportation  Lack of Transportation (Medical): No  Lack of Transportation (Non-Medical): No  Physical Activity: Insufficiently Active (05/05/2023)  Received from [facility]  Exercise Vital Sign  Days of Exercise per Week: 5 days  Minutes of Exercise per Session: 10 min  Stress: No Stress Concern Present (05/05/2023)  Received from [facility] - Occupational Stress Questionnaire  Feeling of Stress :  Only a little  Social Connections: Moderately Integrated (05/05/2023)  Received from [facility]  Social Connection and Isolation Panel [NHANES]  Frequency of Communication with Friends and Family: More than three times a week  Frequency of Social Gatherings with Friends and Family: Once a week  Attends Religious Services: More  than 4 times per year  Active Member of [employer] or Organizations: No  Marital Status: Married  Housing Stability: Unknown (06/10/2023)  Housing Stability Vital Sign  Homeless in the Last Year: No  Recent Concern: Housing Stability - High Risk (05/05/2023)  Received from [facility] Stability Vital Sign  Unable to Pay for Housing in the Last Year: Yes  Number of Times Moved in the Last Year: 0  Homeless in the Last Year: No   Objective:   Vitals:  06/10/23 0957  PainSc: 0-No pain   There is no height or weight on file to calculate BMI.  Physical Exam HENT:  Head: Normocephalic.  Cardiovascular:  Rate and Rhythm: Normal rate.  Pulmonary:  Effort: Pulmonary effort is normal.  Abdominal:  General: Abdomen is flat.  Palpations: Abdomen is soft.  Tenderness: There is no abdominal tenderness.  Neurological:  General: No focal deficit present.  Mental Status: She is alert.  Psychiatric:  Mood and Affect: Mood normal.     Labs, Imaging and Diagnostic Testing: Latest Reference Range & Units 06/01/23 09:27  Sodium 135 - 145 mEq/L 139  Potassium 3.5 - 5.1 mEq/L 3.7  Chloride 96 - 112 mEq/L 102  CO2 19 - 32 mEq/L 29  Glucose 70 - 99 mg/dL 85  BUN 6 - 23 mg/dL 19  Creatinine 4.16 - 6.06 mg/dL 3.01  Calcium 8.4 - 60.1 mg/dL 9.0  Alkaline Phosphatase 39 - 117 U/L 70  Albumin 3.5 - 5.2 g/dL 3.9  AST 0 - 37 U/L 14  ALT 0 - 35 U/L 13  Total Protein 6.0 - 8.3 g/dL 7.4  Total Bilirubin 0.2 - 1.2 mg/dL 0.9   CLINICAL DATA: Mid epigastric pain for 2-3 days, pain radiating to right shoulder, diarrhea  EXAM: ULTRASOUND ABDOMEN LIMITED RIGHT UPPER QUADRANT  COMPARISON: 08/15/2014  FINDINGS: Gallbladder:  Echogenic nonshadowing material layers dependently within the gallbladder, consistent with gallbladder sludge. No shadowing gallstones. No evidence of gallbladder wall thickening or pericholecystic fluid. Negative sonographic Murphy sign.  Common bile  duct:  Diameter: 5 mm  Liver:  Increased liver echotexture most consistent with hepatic steatosis. No focal liver abnormality. Portal vein is patent on color Doppler imaging with normal direction of blood flow towards the liver.  Other: None.  IMPRESSION: 1. Gallbladder sludge. No evidence of cholelithiasis or acute cholecystitis. 2. Increased liver echotexture most consistent with hepatic steatosis.   Electronically Signed By: Bobbye Burrow M.D. On: 05/06/2023 18:48 Assessment and Plan:   Diagnoses and all orders for this visit:  Biliary colic   Symptoms consistent with biliary colic. She does have sludge and may have small stones. Risks and benefits of cholecystectomy reviewed today. Alternative therapies to cholecystectomy reviewed today. She wishes to proceed with laparoscopic cholecystectomy. Risk of bleeding, infection, common bile duct injury, injury to internal structures, anesthesia risk, exacerbation of underlying medical problems, quality of life issues all reviewed today.  Sharlee Dawes, MD

## 2023-08-11 NOTE — Anesthesia Postprocedure Evaluation (Signed)
 Anesthesia Post Note  Patient: [name]  Procedure(s) Performed: LAPAROSCOPIC CHOLECYSTECTOMY (Abdomen)     Patient location during evaluation: PACU Anesthesia Type: General Level of consciousness: awake and alert, oriented and patient cooperative Pain management: pain level controlled Vital Signs Assessment: post-procedure vital signs reviewed and stable Respiratory status: spontaneous breathing, nonlabored ventilation and respiratory function stable Cardiovascular status: blood pressure returned to baseline and stable Postop Assessment: no apparent nausea or vomiting Anesthetic complications: no   No notable events documented.  Last Vitals:  Vitals:   08/11/23 1315 08/11/23 1330  BP: (!) 141/62 (!) 145/79  Pulse: 69 68  Resp: 15 16  Temp:  (!) 36.4 C  SpO2: 95% 94%    Last Pain:  Vitals:   08/11/23 1315  TempSrc:   PainSc: 4                  Jacquelyne Matte

## 2023-08-11 NOTE — Op Note (Signed)
 Laparoscopic Cholecystectomy with ICG Procedure Note  Indications: This patient presents with symptomatic gallbladder disease and will undergo laparoscopic cholecystectomy.The procedure has been discussed with the patient. Operative and non operative treatments have been discussed. Risks of surgery include bleeding, infection,  Common bile duct injury,  Injury to the stomach,liver, colon,small intestine, abdominal wall,  Diaphragm,  Major blood vessels,  And the need for an open procedure.  Other risks include worsening of medical problems, death,  DVT and pulmonary embolism, and cardiovascular events.   Medical options have also been discussed. The patient has been informed of long term expectations of surgery and non surgical options,  The patient agrees to proceed.     Pre-operative Diagnosis: Calculus of gallbladder with other cholecystitis, without mention of obstruction  Post-operative Diagnosis: Same  Surgeon: Rodrigo Clara MD     Anesthesia: General endotracheal anesthesia and Local anesthesia 0.25.% bupivacaine , with epinephrine   ASA Class: 2  Procedure Details  The patient was seen again in the Holding Room. The risks, benefits, complications, treatment options, and expected outcomes were discussed with the patient. The possibilities of reaction to medication, pulmonary aspiration, perforation of viscus, bleeding, recurrent infection, finding a normal gallbladder, the need for additional procedures, failure to diagnose a condition, the possible need to convert to an open procedure, and creating a complication requiring transfusion or operation were discussed with the patient. The patient and/or family concurred with the proposed plan, giving informed consent. The site of surgery properly noted/marked. The patient was taken to Operating Room, identified as [name] and the procedure verified as Laparoscopic Cholecystectomy with Intraoperative Cholangiograms. A Time Out was held and  the above information confirmed.  Prior to the induction of general anesthesia, antibiotic prophylaxis was administered. General endotracheal anesthesia was then administered and tolerated well. After the induction, the abdomen was prepped in the usual sterile fashion. The patient was positioned in the supine position with the left arm comfortably tucked, along with some reverse Trendelenburg.  Local anesthetic agent was injected into the skin near the umbilicus and an incision made. The midline fascia was incised and the Hasson technique was used to introduce a 12 mm port under direct vision. It was secured with a figure of eight Vicryl suture placed in the usual fashion. Pneumoperitoneum was then created with CO2 and tolerated well without any adverse changes in the patient's vital signs. Additional trocars were introduced under direct vision with an 11 mm trocar in the epigastrium and two  5 mm trocars in the right upper quadrant. All skin incisions were infiltrated with a local anesthetic agent before making the incision and placing the trocars.   The gallbladder was identified, the fundus grasped and retracted cephalad. Adhesions were lysed bluntly and with the electrocautery where indicated, taking care not to injure any adjacent organs or viscus. The infundibulum was grasped and retracted laterally, exposing the peritoneum overlying the triangle of Calot. This was then divided and exposed in a blunt fashion. The cystic duct was clearly identified and bluntly dissected circumferentially. The junctions of the gallbladder, cystic duct and common bile duct were clearly identified prior to the division of any linear structure.   ICG dye used and the cystic duct and CBD were visualized. The cystic duct was very small and would not accommodate a catheter.   The cystic duct was then  ligated with surgical clips  on the patient side and  clipped on the gallbladder side and divided. The cystic artery was  identified, dissected  free, ligated with clips and divided as well. Posterior cystic artery clipped and divided.  The gallbladder was dissected from the liver bed in retrograde fashion with the electrocautery. The gallbladder was removed. The liver bed was irrigated and inspected. Hemostasis was achieved with the electrocautery. Copious irrigation was utilized and was repeatedly aspirated until clear all particulate matter. Hemostasis was achieved with no signs  Of bleeding or bile leakage.  Pneumoperitoneum was completely reduced after viewing removal of the trocars under direct vision. The wound was thoroughly irrigated and the fascia was then closed with a figure of eight suture; the skin was then closed with 4 0 MONOCRYL  and a sterile dressing of Dermabond  was applied.  Instrument, sponge, and needle counts were correct at closure and at the conclusion of the case.   Findings:  Cholelithiasis  Estimated Blood Loss: Minimal         Drains: none          Total IV Fluids: per OR record          Specimens: Gallbladder           Complications: None; patient tolerated the procedure well.         Disposition: PACU - hemodynamically stable.         Condition: stable

## 2023-08-11 NOTE — Anesthesia Preprocedure Evaluation (Addendum)
 Anesthesia Evaluation  Patient identified by MRN, date of birth, ID band Patient awake    Reviewed: Allergy & Precautions, NPO status , Patient's Chart, lab work & pertinent test results, reviewed documented beta blocker date and time   Airway Mallampati: II       Dental no notable dental hx. (+) Dental Advisory Given, Teeth Intact   Pulmonary sleep apnea and Continuous Positive Airway Pressure Ventilation    Pulmonary exam normal breath sounds clear to auscultation       Cardiovascular hypertension, Pt. on medications Normal cardiovascular exam Rhythm:Regular Rate:Normal     Neuro/Psych negative neurological ROS  negative psych ROS   GI/Hepatic Neg liver ROS,GERD  Medicated and Controlled,,Biliary Colic    Endo/Other  Hypothyroidism  Class 3 obesityBMI 45  Renal/GU Renal InsufficiencyRenal diseaseCr 1.01  negative genitourinary   Musculoskeletal  (+) Arthritis , Osteoarthritis,    Abdominal  (+) + obese  Peds  Hematology  (+) Blood dyscrasia, anemia Hb 12.4, plt 242 SLE on Plaquenil    Anesthesia Other Findings   Reproductive/Obstetrics negative OB ROS                              Anesthesia Physical Anesthesia Plan  ASA: 3  Anesthesia Plan: General   Post-op Pain Management: Tylenol  PO (pre-op)*, Celebrex  PO (pre-op)*, Ketamine  IV* and Dilaudid  IV   Induction: Intravenous  PONV Risk Score and Plan: 4 or greater and Ondansetron , Dexamethasone , Midazolam  and Treatment may vary due to age or medical condition  Airway Management Planned: Oral ETT  Additional Equipment: None  Intra-op Plan:   Post-operative Plan: Extubation in OR  Informed Consent: I have reviewed the patients History and Physical, chart, labs and discussed the procedure including the risks, benefits and alternatives for the proposed anesthesia with the patient or authorized representative who has indicated  his/her understanding and acceptance.     Dental advisory given  Plan Discussed with: CRNA and Anesthesiologist  Anesthesia Plan Comments:         Anesthesia Quick Evaluation

## 2023-08-11 NOTE — Interval H&P Note (Signed)
 History and Physical Interval Note:  08/11/2023 9:29 AM  [name]  has presented today for surgery, with the diagnosis of BILIARY COLIC.  The various methods of treatment have been discussed with the patient and family. After consideration of risks, benefits and other options for treatment, the patient has consented to  Procedure(s) with comments: LAPAROSCOPIC CHOLECYSTECTOMY (N/A) - WITH ICG DYE as a surgical intervention.  The patient's history has been reviewed, patient examined, no change in status, stable for surgery.  I have reviewed the patient's chart and labs.  Questions were answered to the patient's satisfaction.   The procedure has been discussed with the patient. Operative and non operative treatments have been discussed. Risks of surgery include bleeding, infection,  Common bile duct injury,  Injury to the stomach,liver, colon,small intestine, abdominal wall,  Diaphragm,  Major blood vessels,  And the need for an open procedure.  Other risks include worsening of medical problems, death,  DVT and pulmonary embolism, and cardiovascular events.   Medical options have also been discussed. The patient has been informed of long term expectations of surgery and non surgical options,  The patient agrees to proceed.     [name]

## 2023-08-11 NOTE — Discharge Instructions (Addendum)
 ________________ [facility] ______[facility] LAPAROSCOPIC SURGERY: POST OP INSTRUCTIONS Always review your discharge instruction sheet given to you by the facility where your surgery was performed. IF YOU HAVE DISABILITY OR FAMILY LEAVE FORMS, YOU MUST BRING THEM TO THE OFFICE FOR PROCESSING.   DO NOT GIVE THEM TO YOUR DOCTOR.  A prescription for pain medication may be given to you upon discharge.  Take your pain medication as prescribed, if needed.  If narcotic pain medicine is not needed, then you may take acetaminophen  (Tylenol ) or ibuprofen  (Advil ) as needed. Take your usually prescribed medications unless otherwise directed. If you need a refill on your pain medication, please contact your pharmacy.  They will contact our office to request authorization. Prescriptions will not be filled after 5pm or on week-ends. You should follow a light diet the first few days after arrival home, such as soup and crackers, etc.  Be sure to include lots of fluids daily. Most patients will experience some swelling and bruising in the area of the incisions.  Ice packs will help.  Swelling and bruising can take several days to resolve.  It is common to experience some constipation if taking pain medication after surgery.  Increasing fluid intake and taking a stool softener (such as Colace) will usually help or prevent this problem from occurring.  A mild laxative (Milk of Magnesia or Miralax) should be taken according to package instructions if there are no bowel movements after 48 hours. Unless discharge instructions indicate otherwise, you may remove your bandages 24-48 hours after surgery, and you may shower at that time.  You may have steri-strips (small skin tapes) in place directly over the incision.  These strips should be left on the skin for 7-10 days.  If your surgeon used skin glue on the incision, you may shower in 24 hours.  The glue will flake off over the next 2-3 weeks.  Any sutures or  staples will be removed at the office during your follow-up visit. ACTIVITIES:  You may resume regular (light) daily activities beginning the next day--such as daily self-care, walking, climbing stairs--gradually increasing activities as tolerated.  You may have sexual intercourse when it is comfortable.  Refrain from any heavy lifting or straining until approved by your doctor. You may drive when you are no longer taking prescription pain medication, you can comfortably wear a seatbelt, and you can safely maneuver your car and apply brakes. RETURN TO WORK:  __________________________________________________________ [name] should see your doctor in the office for a follow-up appointment approximately 2-3 weeks after your surgery.  Make sure that you call for this appointment within a day or two after you arrive home to insure a convenient appointment time. OTHER INSTRUCTIONS: __________________________________________________________________________________________________________________________ __________________________________________________________________________________________________________________________ WHEN TO CALL YOUR DOCTOR: Fever over 101.0 Inability to urinate Continued bleeding from incision. Increased pain, redness, or drainage from the incision. Increasing abdominal pain  The clinic staff is available to answer your questions during regular business hours.  Please don't hesitate to call and ask to speak to one of the nurses for clinical concerns.  If you have a medical emergency, go to the nearest emergency room or call 911.  A surgeon from [facility] is always on call at the hospital. [address], [place], [place]  [address] ? [address] [address], [place], [place]   [phone] ? [phone] ? FAX [phone] Web site: [email or web address]  WHEN TO CALL YOUR DOCTOR: Fever over 101.0 Nausea and/or vomiting. Extreme swelling or  bruising. Continued bleeding from incision. Increased pain, redness, or drainage from the incision.  The clinic staff is available to answer your questions during regular business hours.  Please don't hesitate to call and ask to speak to one of the nurses for clinical concerns.  If you have a medical emergency, go to the nearest emergency room or call 911.  A surgeon from [facility] is always on call at the hospital.  For further questions, please visit [email or web address]

## 2023-08-12 ENCOUNTER — Encounter (HOSPITAL_COMMUNITY): Payer: Self-pay | Admitting: Surgery

## 2023-08-12 LAB — SURGICAL PATHOLOGY

## 2023-08-15 NOTE — Progress Notes (Signed)
 Agree with the assessment and plan as outlined by Boone Master, PA-C.  Nimrat Woolworth, DO, [facility]

## 2023-08-17 ENCOUNTER — Ambulatory Visit: Payer: Self-pay

## 2023-08-17 ENCOUNTER — Ambulatory Visit (HOSPITAL_BASED_OUTPATIENT_CLINIC_OR_DEPARTMENT_OTHER)
Admission: RE | Admit: 2023-08-17 | Discharge: 2023-08-17 | Disposition: A | Discharge status: Home / Self Care | Source: Ambulatory Visit | Attending: Physician Assistant | Admitting: Physician Assistant

## 2023-08-17 ENCOUNTER — Encounter: Payer: Self-pay | Admitting: Physician Assistant

## 2023-08-17 ENCOUNTER — Ambulatory Visit: Admitting: Physician Assistant

## 2023-08-17 VITALS — BP 134/71 | HR 72 | Ht 68.0 in | Wt 295.0 lb

## 2023-08-17 DIAGNOSIS — W19XXXA Unspecified fall, initial encounter: Secondary | ICD-10-CM | POA: Insufficient documentation

## 2023-08-17 DIAGNOSIS — R1011 Right upper quadrant pain: Secondary | ICD-10-CM

## 2023-08-17 NOTE — Progress Notes (Signed)
 Established patient visit   Patient: [name]   DOB: 12-27-1960   63 y.o. Female  MRN: [id number] Visit Date: 08/17/2023  Today's healthcare provider: Trenton Frock, PA-C   Cc. Fall with pain  Subjective     Pt is sp cholecystectomy 4/24, and fell yesterday 4/29 on her right side when getting her mail. Reports mechanical fall, no LOC, slight head hit with some swelling and tenderness but no bruising. Denies vision changes, dizziness.  Today with RUQ pain, almost under her breast. Tender to touch  Medications: Outpatient Medications Prior to Visit  Medication Sig   amLODipine  (NORVASC ) 10 MG tablet Take 1 tablet (10 mg total) by mouth daily.   Cholecalciferol (VITAMIN D ) 50 MCG (2000 UT) CAPS Take 1 capsule (2,000 Units total) by mouth daily.   estradiol (ESTRACE) 2 MG tablet Take 1 mg by mouth as needed. (Patient not taking: Reported on 08/03/2023)   furosemide  (LASIX ) 40 MG tablet 1 every 3 days if needed. (Patient taking differently: Take 40 mg by mouth daily as needed for fluid or edema.)   hydrochlorothiazide  (HYDRODIURIL ) 25 MG tablet Take 1 tablet (25 mg total) by mouth daily.   hydroxychloroquine  (PLAQUENIL ) 200 MG tablet Take 400 mg by mouth at bedtime.   levothyroxine  (SYNTHROID ) 50 MCG tablet Take 1 tablet (50 mcg total) by mouth daily. (Patient not taking: Reported on 08/03/2023)   losartan  (COZAAR ) 100 MG tablet Take 1 tablet (100 mg total) by mouth daily. (Patient not taking: Reported on 08/03/2023)   naproxen  sodium (ALEVE ) 220 MG tablet Take 440 mg by mouth daily as needed (pain).   omeprazole  (PRILOSEC) 20 MG capsule Take 1 capsule (20 mg total) by mouth daily. (Patient taking differently: Take 20 mg by mouth at bedtime.)   oxyCODONE  (OXY IR/ROXICODONE ) 5 MG immediate release tablet Take 1 tablet (5 mg total) by mouth every 6 (six) hours as needed for severe pain (pain score 7-10).   Polyethyl Glycol-Propyl Glycol (SYSTANE OP) Place 2 drops into both eyes in  the morning and at bedtime.   potassium chloride  (KLOR-CON  M) 10 MEQ tablet Take 2 tablets (20 mEq total) by mouth 2 (two) times daily. (Patient taking differently: Take 20 mEq by mouth at bedtime.)   RESTASIS 0.05 % ophthalmic emulsion Place 1 drop into both eyes daily. (Patient not taking: Reported on 08/03/2023)   [DISCONTINUED] ibuprofen  (ADVIL ) 800 MG tablet Take 1 tablet (800 mg total) by mouth every 8 (eight) hours as needed.   No facility-administered medications prior to visit.    Review of Systems  Constitutional:  Negative for fatigue and fever.  Respiratory:  Negative for cough and shortness of breath.   Cardiovascular:  Negative for chest pain and leg swelling.  Gastrointestinal:  Positive for abdominal pain.  Neurological:  Negative for dizziness and headaches.       Objective    BP 134/71   Pulse 72   Ht 5\' 8"  (1.727 m)   Wt 295 lb (133.8 kg)   BMI 44.85 kg/m    Physical Exam Vitals reviewed.  Constitutional:      Appearance: She is not ill-appearing.  HENT:     Head: Normocephalic.     Comments: Minimal edema right brow w/ no ecchymosis Eyes:     Conjunctiva/sclera: Conjunctivae normal.  Cardiovascular:     Rate and Rhythm: Normal rate.  Pulmonary:     Effort: Pulmonary effort is normal. No respiratory distress.  Abdominal:  Comments: Mildly tender to ribs under right breast. No discrete RUQ tenderness or + murphy's sign . No ecchymosis  Neurological:     Mental Status: She is alert and oriented to person, place, and time.  Psychiatric:        Mood and Affect: Mood normal.        Behavior: Behavior normal.     No results found for any visits on 08/17/23.  Assessment & Plan    RUQ pain -     US  ABDOMEN LIMITED RUQ (LIVER/GB) -     DG Ribs Unilateral Right  Fall, initial encounter -     US  ABDOMEN LIMITED RUQ (LIVER/GB) -     DG Ribs Unilateral Right   Recommending xray r/o rib fx, ruq us  to confirm no bleeding d/t recent chole. Low sus  given symptoms today.  Advised pt to monitor her symptoms ,rest, ice/heat, tylenol . Any dizziness or worsening of pain I recommend ED  Return if symptoms worsen or fail to improve.       Trenton Frock, PA-C  [facility] [phone] (phone) [phone] (fax)  [facility]

## 2023-08-17 NOTE — Telephone Encounter (Signed)
 Chief Complaint: Fall Symptoms: right side pain 8/10 with movement Frequency: Come and go, onset yesterday  Pertinent Negatives: Patient denies SOB, chest pain, bruising, swelling Disposition: [] ED /[] Urgent Care (no appt availability in office) / [x] Appointment(In office/virtual)/ []  [facility]/ [] Home Care/ [] Refused Recommended Disposition /[] [facility]/ []  Follow-up with PCP Additional Notes: Patient states she drove for the first time after having gallbladder surgery on 08/11/23. Patient states when she got home she must have tripped over her feet and fell on her right side. Patient reports having sharp pain 8/10 with movement. Care advice was given and patient has been scheduled for an appointment today for evaluation.    Copied from CRM [id number]. Topic: Clinical - Red Word Triage >> Aug 17, 2023  8:35 AM Albertha Alosa wrote: [facility] that prompted transfer to Nurse Triage: Patient called stating she had surgery for [facility] Bladder a few days ago, yesterday she fell and has a sharp pain under breast and is still hurting this morning Reason for Disposition  [1] MODERATE weakness (i.e., interferes with work, school, normal activities) AND [2] new-onset or worsening  Answer Assessment - Initial Assessment Questions 1. MECHANISM: "How did the fall happen?"     I think I tripped over something  2. DOMESTIC VIOLENCE AND ELDER ABUSE SCREENING: "Did you fall because someone pushed you or tried to hurt you?" If Yes, ask: "Are you safe now?"     No 3. ONSET: "When did the fall happen?" (e.g., minutes, hours, or days ago)     Yesterday  4. LOCATION: "What part of the body hit the ground?" (e.g., back, buttocks, head, hips, knees, hands, head, stomach)     The right side of my body 5. INJURY: "Did you hurt (injure) yourself when you fell?" If Yes, ask: "What did you injure? Tell me more about this?" (e.g., body area; type of injury; pain severity)"     Right side under the breast line  is hurting the most  6. PAIN: "Is there any pain?" If Yes, ask: "How bad is the pain?" (e.g., Scale 1-10; or mild,  moderate, severe)   - NONE (0): No pain   - MILD (1-3): Doesn't interfere with normal activities    - MODERATE (4-7): Interferes with normal activities or awakens from sleep    - SEVERE (8-10): Excruciating pain, unable to do any normal activities      8/10 with movement  7. SIZE: For cuts, bruises, or swelling, ask: "How large is it?" (e.g., inches or centimeters)      No 8. PREGNANCY: "Is there any chance you are pregnant?" "When was your last menstrual period?"     N/A 9. OTHER SYMPTOMS: "Do you have any other symptoms?" (e.g., dizziness, fever, weakness; new onset or worsening).      No 10. CAUSE: "What do you think caused the fall (or falling)?" (e.g., tripped, dizzy spell)       I think I tripped over my feet  Protocols used: Falls and [facility]

## 2023-08-18 ENCOUNTER — Ambulatory Visit (HOSPITAL_BASED_OUTPATIENT_CLINIC_OR_DEPARTMENT_OTHER)
Admission: RE | Admit: 2023-08-18 | Discharge: 2023-08-18 | Disposition: A | Discharge status: Home / Self Care | Source: Ambulatory Visit | Attending: Physician Assistant | Admitting: Physician Assistant

## 2023-08-18 ENCOUNTER — Encounter: Payer: Self-pay | Admitting: Physician Assistant

## 2023-08-18 DIAGNOSIS — K760 Fatty (change of) liver, not elsewhere classified: Secondary | ICD-10-CM | POA: Insufficient documentation

## 2023-08-18 DIAGNOSIS — Z9049 Acquired absence of other specified parts of digestive tract: Secondary | ICD-10-CM | POA: Diagnosis not present

## 2023-08-18 DIAGNOSIS — W19XXXA Unspecified fall, initial encounter: Secondary | ICD-10-CM | POA: Diagnosis not present

## 2023-08-18 DIAGNOSIS — R1011 Right upper quadrant pain: Secondary | ICD-10-CM | POA: Insufficient documentation

## 2023-08-19 ENCOUNTER — Telehealth: Payer: Self-pay | Admitting: Family Medicine

## 2023-08-19 NOTE — Telephone Encounter (Signed)
 Copied from CRM [id number]. Topic: General - Other >> Aug 19, 2023  2:16 PM [name] wrote: Reason for CRM: Patient called in stating she need an extension doctors not until 09/06/23 due to pain.

## 2023-08-20 ENCOUNTER — Other Ambulatory Visit: Payer: Self-pay | Admitting: Family Medicine

## 2023-08-20 ENCOUNTER — Encounter: Payer: Self-pay | Admitting: Family Medicine

## 2023-08-20 DIAGNOSIS — K769 Liver disease, unspecified: Secondary | ICD-10-CM

## 2023-08-29 NOTE — Addendum Note (Signed)
 Addended by: Roel Clarity R on: 08/29/2023 08:30 AM   Modules accepted: Orders

## 2023-09-14 ENCOUNTER — Inpatient Hospital Stay: Admission: RE | Admit: 2023-09-14 | Source: Ambulatory Visit

## 2023-10-03 ENCOUNTER — Ambulatory Visit: Payer: 59 | Admitting: Family Medicine

## 2023-10-03 ENCOUNTER — Ambulatory Visit: Admitting: Family Medicine

## 2023-10-10 ENCOUNTER — Ambulatory Visit: Admitting: Family Medicine

## 2023-10-10 ENCOUNTER — Encounter: Payer: Self-pay | Admitting: Family Medicine

## 2023-10-10 VITALS — BP 140/88 | HR 84 | Temp 98.0°F | Resp 18 | Ht 68.0 in | Wt 300.0 lb

## 2023-10-10 DIAGNOSIS — E876 Hypokalemia: Secondary | ICD-10-CM | POA: Diagnosis not present

## 2023-10-10 DIAGNOSIS — E039 Hypothyroidism, unspecified: Secondary | ICD-10-CM

## 2023-10-10 DIAGNOSIS — E559 Vitamin D deficiency, unspecified: Secondary | ICD-10-CM

## 2023-10-10 DIAGNOSIS — I10 Essential (primary) hypertension: Secondary | ICD-10-CM

## 2023-10-10 DIAGNOSIS — L853 Xerosis cutis: Secondary | ICD-10-CM

## 2023-10-10 DIAGNOSIS — R296 Repeated falls: Secondary | ICD-10-CM | POA: Diagnosis not present

## 2023-10-10 DIAGNOSIS — R600 Localized edema: Secondary | ICD-10-CM

## 2023-10-10 DIAGNOSIS — E785 Hyperlipidemia, unspecified: Secondary | ICD-10-CM

## 2023-10-10 MED ORDER — LOSARTAN POTASSIUM 100 MG PO TABS: 100.0000 mg | ORAL_TABLET | Freq: Every day | ORAL | 3 refills | Status: AC

## 2023-10-10 MED ORDER — POTASSIUM CHLORIDE CRYS ER 10 MEQ PO TBCR: 20.0000 meq | EXTENDED_RELEASE_TABLET | Freq: Two times a day (BID) | ORAL | 5 refills | Status: DC

## 2023-10-10 MED ORDER — FUROSEMIDE 40 MG PO TABS
ORAL_TABLET | ORAL | 1 refills | Status: AC
Start: 2023-10-10 — End: ?

## 2023-10-10 MED ORDER — HYDROCHLOROTHIAZIDE 25 MG PO TABS: 25.0000 mg | ORAL_TABLET | Freq: Every day | ORAL | 3 refills | Status: AC

## 2023-10-10 MED ORDER — AMLODIPINE BESYLATE 10 MG PO TABS: 10.0000 mg | ORAL_TABLET | Freq: Every day | ORAL | 3 refills | Status: AC

## 2023-10-10 MED ORDER — TRIAMCINOLONE ACETONIDE 0.1 % EX CREA: 1.0000 | TOPICAL_CREAM | Freq: Two times a day (BID) | CUTANEOUS | 3 refills | Status: AC

## 2023-10-10 NOTE — Assessment & Plan Note (Signed)
 Well controlled, no changes to meds. Encouraged heart healthy diet such as the DASH diet and exercise as tolerated.

## 2023-10-10 NOTE — Patient Instructions (Signed)

## 2023-10-10 NOTE — Assessment & Plan Note (Signed)
 Check labs

## 2023-10-10 NOTE — Progress Notes (Signed)
 Established Patient Office Visit  Subjective   Patient ID: [name], female    DOB: 1960/07/02  Age: 63 y.o. MRN: [id number]  Chief Complaint  Patient presents with   Hypertension   Follow-up    HPI Discussed the use of AI scribe software for clinical note transcription with the patient, who gave verbal consent to proceed.  History of Present Illness [name] is a 63 year old female with a history of high blood pressure who presents with concerns about recent falls and elevated blood pressure.  She has experienced two falls recently. The first fall occurred six to seven days after gallbladder surgery, and the second fall happened at work, where she fell on her left side. She did not seek emergency care after these falls but did see a provider for evaluation. The second fall occurred while walking on a path, possibly due to her shoes, which she has since discarded. She feels stress and nervousness about the possibility of falling again. She underwent an ultrasound and an x-ray of the ribs, and an MRI is scheduled for the end of the month. The pain from the falls has improved significantly, though she still feels 'icky'.  She has a history of high blood pressure, which is currently elevated. Her losartan  prescription has not been filled recently, which may be contributing to her high blood pressure. She used to be on automatic refills, but this stopped, possibly due to not having had a recent appointment. She has not been checking her blood pressure at home and needs refills for most of her medications. She has been in contact with her pharmacist frequently.  She has dry, itchy patches on her leg, which flare up and itch intensely at times. She inquires about a cream to help manage these symptoms.  She works at [employer] and lives near [employer], [place].   Patient Active Problem List   Diagnosis Date Noted   Diarrhea of presumed infectious origin 04/15/2023    Preventative health care 06/09/2021   Peripheral edema 02/27/2020   OSA on CPAP 09/19/2019   Acute non-recurrent pansinusitis 04/26/2019   Counseled about COVID-19 virus infection 04/26/2019   Insulin  resistance 03/07/2019   Vitamin D  deficiency 03/07/2019   Diverticulosis 03/07/2019   Obesity with alveolar hypoventilation and body mass 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 (HCC) 03/01/2019   Excessive daytime sleepiness 03/01/2019   Class 3 severe obesity with serious comorbidity and body mass index (BMI) of 45.0 to 49.9 in adult 03/01/2019   Gastroesophageal reflux disease without esophagitis 03/01/2019   Hypothyroidism 06/15/2018   SLE (systemic lupus erythematosus related syndrome) (HCC), followed by Rheumatology, Rx Plaquenil  10/13/2017   Hyperlipidemia 01/20/2012   Osteoarthritis 04/23/2009   Pain of upper abdomen 09/13/2008   Essential hypertension 05/18/2006   Past Medical History:  Diagnosis Date   Abdominal pain, other specified site    Acute upper respiratory infections of unspecified site    Allergy    seasonal   Anemia    Benign neoplasm of skin, site unspecified    Disturbance of skin sensation    Diverticulosis of colon (without mention of hemorrhage)    Family history of diabetes mellitus    Family history of ischemic heart disease    Family history of malignant neoplasm of genital organ, other    GERD (gastroesophageal reflux disease)    Hypertension    Hypokalemia    Hypothyroidism    Left shoulder pain  Low blood potassium    Post-menopausal    Rheumatoid arthritis (HCC)    Sleep apnea    wears cpap    Sprain and strain of unspecified site of knee and leg    Sprain of neck    Thyroid  disease    Unspecified essential hypertension    Past Surgical History:  Procedure Laterality Date   ABDOMINAL HYSTERECTOMY     BREAST BIOPSY Right 10/30/2021   BREAST LUMPECTOMY WITH RADIOACTIVE SEED LOCALIZATION Right 12/30/2021   Procedure: RIGHT BREAST LUMPECTOMY WITH  RADIOACTIVE SEED LOCALIZATION;  Surgeon: Vanderbilt Ned, MD;  Location: [facility];  Service: General;  Laterality: Right;   BUNIONECTOMY  2005   right foot   CHOLECYSTECTOMY N/A 08/11/2023   Procedure: LAPAROSCOPIC CHOLECYSTECTOMY;  Surgeon: Vanderbilt Ned, MD;  Location: [facility] OR;  Service: General;  Laterality: N/A;  WITH ICG DYE   COLONOSCOPY  6-12/2008   SHOULDER SURGERY     left;spurs 2-09- dr cayetano   TOTAL ABDOMINAL HYSTERECTOMY W/ BILATERAL SALPINGOOPHORECTOMY  2002   Social History   Tobacco Use   Smoking status: Never   Smokeless tobacco: Never  Vaping Use   Vaping status: Never Used  Substance Use Topics   Alcohol use: No   Drug use: No   Social History   Socioeconomic History   Marital status: Married    Spouse name: Not on file   Number of children: 0   Years of education: Not on file   Highest education level: Associate degree: occupational, Scientist, product/process development, or vocational program  Occupational History   Occupation: Forensic scientist CLERK    Employer: [employer], [employer]   Occupation: retired    Comment: 03/18/2021  Tobacco Use   Smoking status: Never   Smokeless tobacco: Never  Vaping Use   Vaping status: Never Used  Substance and Sexual Activity   Alcohol use: No   Drug use: No   Sexual activity: Yes    Partners: Male  Other Topics Concern   Not on file  Social History Narrative   Exercise -- walking 15 min x 3 a day    Social Drivers of Health   Financial Resource Strain: Low Risk  (10/09/2023)   Overall Financial Resource Strain (CARDIA)    Difficulty of Paying Living Expenses: Not very hard  Food Insecurity: Food Insecurity Present (10/09/2023)   Hunger Vital Sign    Worried About Running Out of Food in the Last Year: Sometimes true    Ran Out of Food in the Last Year: Sometimes true  Transportation Needs: No Transportation Needs (10/09/2023)   PRAPARE - Transportation    Lack of Transportation (Medical): No    Lack of Transportation  (Non-Medical): No  Physical Activity: Sufficiently Active (10/09/2023)   Exercise Vital Sign    Days of Exercise per Week: 5 days    Minutes of Exercise per Session: 30 min  Stress: No Stress Concern Present (10/09/2023)   [employer] of Occupational Health - Occupational Stress Questionnaire    Feeling of Stress: Only a little  Social Connections: Moderately Integrated (10/09/2023)   Social Connection and Isolation Panel    Frequency of Communication with Friends and Family: More than three times a week    Frequency of Social Gatherings with Friends and Family: More than three times a week    Attends Religious Services: 1 to 4 times per year    Active Member of [employer] or Organizations: No    Attends Banker Meetings: Not on  file    Marital Status: Married  Catering manager Violence: Not on file   Family Status  Relation Name Status   Mother  Alive   Father  Deceased   Sister  (Not Specified)   Brother  (Not Specified)   Mat Aunt  (Not Specified)   Cousin  (Not Specified)   Neg Hx  (Not Specified)  No partnership data on file   Family History  Problem Relation Age of Onset   Diabetes Mother    Hypertension Mother    Dementia Mother    Stroke Mother    Prostate cancer Father    Hypertension Father    Cancer Father    Hypertension Sister    Diabetes Brother    Stroke Maternal Aunt    Breast cancer Maternal Aunt    Breast cancer Cousin    Colon cancer Neg Hx    Colon polyps Neg Hx    Esophageal cancer Neg Hx    Rectal cancer Neg Hx    Stomach cancer Neg Hx    No Known Allergies    Review of Systems  Constitutional:  Negative for fever and malaise/fatigue.  HENT:  Negative for congestion.   Eyes:  Negative for blurred vision.  Respiratory:  Negative for cough and shortness of breath.   Cardiovascular:  Negative for chest pain, palpitations and leg swelling.  Gastrointestinal:  Negative for abdominal pain, blood in stool, nausea and vomiting.   Genitourinary:  Negative for dysuria and frequency.  Musculoskeletal:  Positive for falls. Negative for back pain.  Skin:  Negative for rash.  Neurological:  Negative for dizziness, loss of consciousness and headaches.  Endo/Heme/Allergies:  Negative for environmental allergies.  Psychiatric/Behavioral:  Negative for depression. The patient is not nervous/anxious.       Objective:     BP (!) 140/88 (BP Location: Left Arm, Patient Position: Sitting, Cuff Size: Large)   Pulse 84   Temp 98 F (36.7 C) (Oral)   Resp 18   Ht 5' 8 (1.727 m)   Wt 300 lb (136.1 kg)   SpO2 97%   BMI 45.61 kg/m  BP Readings from Last 3 Encounters:  10/10/23 (!) 140/88  08/17/23 134/71  08/11/23 (!) 145/79   Wt Readings from Last 3 Encounters:  10/10/23 300 lb (136.1 kg)  08/17/23 295 lb (133.8 kg)  08/11/23 294 lb (133.4 kg)   SpO2 Readings from Last 3 Encounters:  10/10/23 97%  08/11/23 94%  08/03/23 99%      Physical Exam Vitals and nursing note reviewed.  Constitutional:      General: She is not in acute distress.    Appearance: Normal appearance. She is well-developed.  HENT:     Head: Normocephalic and atraumatic.   Eyes:     General: No scleral icterus.       Right eye: No discharge.        Left eye: No discharge.    Cardiovascular:     Rate and Rhythm: Normal rate and regular rhythm.     Heart sounds: No murmur heard. Pulmonary:     Effort: Pulmonary effort is normal. No respiratory distress.     Breath sounds: Normal breath sounds.   Musculoskeletal:        General: No tenderness. Normal range of motion.     Cervical back: Normal range of motion and neck supple.     Right lower leg: No edema.     Left lower leg: No edema.  Skin:    General: Skin is warm and dry.   Neurological:     Mental Status: She is alert and oriented to person, place, and time.   Psychiatric:        Mood and Affect: Mood normal.        Behavior: Behavior normal.        Thought Content:  Thought content normal.        Judgment: Judgment normal.      No results found for any visits on 10/10/23.  Last CBC Lab Results  Component Value Date   WBC 4.3 08/03/2023   HGB 12.4 08/03/2023   HCT 38.4 08/03/2023   MCV 88.1 08/03/2023   MCH 28.4 08/03/2023   RDW 13.9 08/03/2023   PLT 242 08/03/2023   Last metabolic panel Lab Results  Component Value Date   GLUCOSE 82 08/03/2023   NA 141 08/03/2023   K 4.3 08/03/2023   CL 108 08/03/2023   CO2 26 08/03/2023   BUN 13 08/03/2023   CREATININE 1.01 (H) 08/03/2023   GFRNONAA >60 08/03/2023   CALCIUM 8.7 (L) 08/03/2023   PROT 7.4 06/01/2023   ALBUMIN 3.9 06/01/2023   LABGLOB 2.4 07/12/2019   AGRATIO 1.6 07/12/2019   BILITOT 0.9 06/01/2023   ALKPHOS 70 06/01/2023   AST 14 06/01/2023   ALT 13 06/01/2023   ANIONGAP 7 08/03/2023   Last lipids Lab Results  Component Value Date   CHOL 175 04/04/2023   HDL 63.40 04/04/2023   LDLCALC 95 04/04/2023   LDLDIRECT 128.5 04/30/2008   TRIG 80.0 04/04/2023   CHOLHDL 3 04/04/2023   Last hemoglobin A1c Lab Results  Component Value Date   HGBA1C 5.1 04/04/2023   Last thyroid  functions Lab Results  Component Value Date   TSH 2.42 04/04/2023   T3TOTAL 110 07/12/2019   T4TOTAL 10.1 06/14/2022   Last vitamin D  Lab Results  Component Value Date   VD25OH 48.74 04/04/2023   Last vitamin B12 and Folate Lab Results  Component Value Date   VITAMINB12 673 02/21/2019      The 10-year ASCVD risk score (Arnett DK, et al., 2019) is: 8.1%    Assessment & Plan:   Problem List Items Addressed This Visit       Unprioritized   Vitamin D  deficiency   Relevant Orders   VITAMIN D  25 Hydroxy (Vit-D Deficiency, Fractures)   Peripheral edema   Relevant Medications   furosemide  (LASIX ) 40 MG tablet   Hypothyroidism   Check labs       Relevant Orders   TSH   Hyperlipidemia   Encourage heart healthy diet such as MIND or DASH diet, increase exercise, avoid trans fats,  simple carbohydrates and processed foods, consider a krill or fish or flaxseed oil cap daily.        Relevant Medications   amLODipine  (NORVASC ) 10 MG tablet   furosemide  (LASIX ) 40 MG tablet   hydrochlorothiazide  (HYDRODIURIL ) 25 MG tablet   losartan  (COZAAR ) 100 MG tablet   Essential hypertension   Well controlled, no changes to meds. Encouraged heart healthy diet such as the DASH diet and exercise as tolerated.        Relevant Medications   amLODipine  (NORVASC ) 10 MG tablet   furosemide  (LASIX ) 40 MG tablet   hydrochlorothiazide  (HYDRODIURIL ) 25 MG tablet   losartan  (COZAAR ) 100 MG tablet   Other Relevant Orders   CBC with Differential/Platelet   Comprehensive metabolic panel with GFR   Lipid panel  TSH   Other Visit Diagnoses       Falls frequently    -  Primary   Relevant Orders   Ambulatory referral to Physical Therapy     Hypokalemia       Relevant Medications   potassium chloride  (KLOR-CON  M) 10 MEQ tablet   Other Relevant Orders   Comprehensive metabolic panel with GFR     Dry skin dermatitis       Relevant Medications   triamcinolone  cream (KENALOG ) 0.1 %     Assessment and Plan Assessment & Plan Hypertension   Her blood pressure is elevated, likely due to non-adherence to losartan , as it has not been refilled. Lack of home blood pressure monitoring may contribute to unawareness of her condition. Refill losartan  prescription. Perform blood work today. Encourage home blood pressure monitoring.  Fall Risk   She has experienced two recent falls, one post-gallbladder surgery and another while walking, possibly due to inappropriate footwear. She feels stressed and nervous about falling again, which affects her confidence in walking. Refer to physical therapy for balance improvement. Advise obtaining appropriate footwear. Encourage scheduling an appointment with physical therapy.  Pruritic Dermatitis   She reports itchy spots on her leg, possibly exacerbated by  stress, with dry patches present. Steroid cream alone may cause further dryness and itching. Prescribe steroid cream. Advise use of Eucerin cream as a moisturizer to be mixed with the steroid cream.    Return in about 6 months (around 04/10/2024), or if symptoms worsen or fail to improve, for fasting, annual exam.    Jamee JONELLE Antonio Cyndee, DO

## 2023-10-10 NOTE — Assessment & Plan Note (Signed)
 Encourage heart healthy diet such as MIND or DASH diet, increase exercise, avoid trans fats, simple carbohydrates and processed foods, consider a krill or fish or flaxseed oil cap daily.

## 2023-10-11 ENCOUNTER — Other Ambulatory Visit (INDEPENDENT_AMBULATORY_CARE_PROVIDER_SITE_OTHER)

## 2023-10-11 DIAGNOSIS — I10 Essential (primary) hypertension: Secondary | ICD-10-CM

## 2023-10-11 DIAGNOSIS — E559 Vitamin D deficiency, unspecified: Secondary | ICD-10-CM | POA: Diagnosis not present

## 2023-10-11 DIAGNOSIS — E039 Hypothyroidism, unspecified: Secondary | ICD-10-CM

## 2023-10-11 DIAGNOSIS — E876 Hypokalemia: Secondary | ICD-10-CM

## 2023-10-11 LAB — COMPREHENSIVE METABOLIC PANEL WITH GFR
ALT: 11 U/L (ref 0–35)
AST: 14 U/L (ref 0–37)
Albumin: 3.8 g/dL (ref 3.5–5.2)
Alkaline Phosphatase: 71 U/L (ref 39–117)
BUN: 18 mg/dL (ref 6–23)
CO2: 29 meq/L (ref 19–32)
Calcium: 9.1 mg/dL (ref 8.4–10.5)
Chloride: 108 meq/L (ref 96–112)
Creatinine, Ser: 0.99 mg/dL (ref 0.40–1.20)
GFR: 61 mL/min (ref >60.00)
Glucose, Bld: 89 mg/dL (ref 70–99)
Potassium: 3.8 meq/L (ref 3.5–5.1)
Sodium: 141 meq/L (ref 135–145)
Total Bilirubin: 0.9 mg/dL (ref 0.2–1.2)
Total Protein: 6.7 g/dL (ref 6.0–8.3)

## 2023-10-11 LAB — CBC WITH DIFFERENTIAL/PLATELET
Basophils Absolute: 0 10*3/uL (ref 0.0–0.1)
Basophils Relative: 1.1 % (ref 0.0–3.0)
Eosinophils Absolute: 0.2 10*3/uL (ref 0.0–0.7)
Eosinophils Relative: 3.8 % (ref 0.0–5.0)
HCT: 36.2 % (ref 36.0–46.0)
Hemoglobin: 11.9 g/dL — ABNORMAL LOW (ref 12.0–15.0)
Lymphocytes Relative: 28.9 % (ref 12.0–46.0)
Lymphs Abs: 1.2 10*3/uL (ref 0.7–4.0)
MCHC: 32.9 g/dL (ref 30.0–36.0)
MCV: 85 fl (ref 78.0–100.0)
Monocytes Absolute: 0.3 10*3/uL (ref 0.1–1.0)
Monocytes Relative: 7.5 % (ref 3.0–12.0)
Neutro Abs: 2.5 10*3/uL (ref 1.4–7.7)
Neutrophils Relative %: 58.7 % (ref 43.0–77.0)
Platelets: 256 10*3/uL (ref 150.0–400.0)
RBC: 4.26 Mil/uL (ref 3.87–5.11)
RDW: 14.2 % (ref 11.5–15.5)
WBC: 4.3 10*3/uL (ref 4.0–10.5)

## 2023-10-11 LAB — LIPID PANEL
Cholesterol: 178 mg/dL (ref 0–200)
HDL: 58.6 mg/dL (ref >39.00)
LDL Cholesterol: 107 mg/dL — ABNORMAL HIGH (ref 0–99)
NonHDL: 119.71
Total CHOL/HDL Ratio: 3
Triglycerides: 63 mg/dL (ref 0.0–149.0)
VLDL: 12.6 mg/dL (ref 0.0–40.0)

## 2023-10-11 LAB — VITAMIN D 25 HYDROXY (VIT D DEFICIENCY, FRACTURES): VITD: 40.64 ng/mL (ref 30.00–100.00)

## 2023-10-11 LAB — TSH: TSH: 3.11 u[IU]/mL (ref 0.35–5.50)

## 2023-10-16 ENCOUNTER — Ambulatory Visit: Payer: Self-pay | Admitting: Family Medicine

## 2023-10-16 ENCOUNTER — Ambulatory Visit
Admission: RE | Admit: 2023-10-16 | Discharge: 2023-10-16 | Disposition: A | Discharge status: Home / Self Care | Source: Ambulatory Visit | Attending: Family Medicine | Admitting: Family Medicine

## 2023-10-16 DIAGNOSIS — K769 Liver disease, unspecified: Secondary | ICD-10-CM

## 2023-10-16 MED ORDER — GADOPICLENOL 0.5 MMOL/ML IV SOLN
10.0000 mL | Freq: Once | INTRAVENOUS | Status: AC | PRN
  Administered 2023-10-16: 10 mL via INTRAVENOUS

## 2023-10-17 ENCOUNTER — Ambulatory Visit: Payer: Self-pay | Admitting: Family Medicine

## 2023-10-18 NOTE — Therapy (Addendum)
 OUTPATIENT PHYSICAL THERAPY NEURO EVALUATION   Patient Name: [name] MRN: [id number] DOB:Mar 30, 1961, 63 y.o., female Today's Date: 10/24/2023   END OF SESSION:  PT End of Session - 10/24/23 1530     Visit Number 1    Date for PT Re-Evaluation 01/16/24    Authorization Type [employer]    Authorization Time Period VL: 60    PT Start Time 1530    PT Stop Time 1620    PT Time Calculation (min) 50 min    Activity Tolerance Patient tolerated treatment well    Behavior During Therapy WFL for tasks assessed/performed          Past Medical History:  Diagnosis Date   Abdominal pain, other specified site    Acute upper respiratory infections of unspecified site    Allergy    seasonal   Anemia    Benign neoplasm of skin, site unspecified    Disturbance of skin sensation    Diverticulosis of colon (without mention of hemorrhage)    Family history of diabetes mellitus    Family history of ischemic heart disease    Family history of malignant neoplasm of genital organ, other    GERD (gastroesophageal reflux disease)    Hypertension    Hypokalemia    Hypothyroidism    Left shoulder pain    Low blood potassium    Post-menopausal    Rheumatoid arthritis (HCC)    Sleep apnea    wears cpap    Sprain and strain of unspecified site of knee and leg    Sprain of neck    Thyroid  disease    Unspecified essential hypertension    Past Surgical History:  Procedure Laterality Date   ABDOMINAL HYSTERECTOMY     BREAST BIOPSY Right 10/30/2021   BREAST LUMPECTOMY WITH RADIOACTIVE SEED LOCALIZATION Right 12/30/2021   Procedure: RIGHT BREAST LUMPECTOMY WITH RADIOACTIVE SEED LOCALIZATION;  Surgeon: Vanderbilt Ned, MD;  Location: [facility];  Service: General;  Laterality: Right;   BUNIONECTOMY  2005   right foot   CHOLECYSTECTOMY N/A 08/11/2023   Procedure: LAPAROSCOPIC CHOLECYSTECTOMY;  Surgeon: Vanderbilt Ned, MD;  Location: [facility];  Service: General;  Laterality: N/A;  WITH ICG  DYE   COLONOSCOPY  6-12/2008   SHOULDER SURGERY     left;spurs 2-09- dr cayetano   TOTAL ABDOMINAL HYSTERECTOMY W/ BILATERAL SALPINGOOPHORECTOMY  2002   Patient Active Problem List   Diagnosis Date Noted   Diarrhea of presumed infectious origin 04/15/2023   Preventative health care 06/09/2021   Peripheral edema 02/27/2020   OSA on CPAP 09/19/2019   Acute non-recurrent pansinusitis 04/26/2019   Counseled about COVID-19 virus infection 04/26/2019   Insulin  resistance 03/07/2019   Vitamin D  deficiency 03/07/2019   Diverticulosis 03/07/2019   Obesity with alveolar hypoventilation and body mass 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 (HCC) 03/01/2019   Excessive daytime sleepiness 03/01/2019   Class 3 severe obesity with serious comorbidity and body mass index (BMI) of 45.0 to 49.9 in adult 03/01/2019   Gastroesophageal reflux disease without esophagitis 03/01/2019   Hypothyroidism 06/15/2018   SLE (systemic lupus erythematosus related syndrome) (HCC), followed by Rheumatology, Rx Plaquenil  10/13/2017   Hyperlipidemia 01/20/2012   Osteoarthritis 04/23/2009   Pain of upper abdomen 09/13/2008   Essential hypertension 05/18/2006    PCP: Antonio Cyndee Jamee JONELLE, DO   REFERRING PROVIDER: Antonio Cyndee, Jamee JONELLE, DO  REFERRING DIAG: R29.6 (ICD-10-CM) - Falls frequently   THERAPY DIAG:  Repeated falls  Unsteadiness  on feet  Muscle weakness (generalized)  Difficulty in walking, not elsewhere classified  RATIONALE FOR EVALUATION AND TREATMENT: Rehabilitation  ONSET DATE: 08/17/2023  NEXT MD VISIT: 04/30/2024   SUBJECTIVE:                                                                                                                                                                                                         SUBJECTIVE STATEMENT: Pt reports she has felt like she has been walking leaning to 1 side or off-balance for a while now.  On 08/17/23, 6 days after her gall bladder surgery on 08/11/23  she fell while going to her mailbox. Then after returning to work on 09/06/23, she fell again.  She cannot recall any preceding events leading to the fall (no tripping or lightheadedness).  Due to the falls she has lost her confidence with walking and now avoids walking for exercise like she used to due to fear of possibility of falling again.  PAIN: Are you having pain? Yes: NPRS scale: 3/10  Pain location: upper abdomen  Pain description: intermittent empty pain like something is missing, aggravating ache  Aggravating factors: unpredictable  Relieving factors: resolves on its own   PERTINENT HISTORY:  Anemia, HTN, thyroid  disease/hypothyroidism, RA, OA - advanced R knee bone-on-bone, severe obesity, SLE, GERD, R breast lumpectomy, cholecystectomy 08/11/23   PRECAUTIONS: Fall  RED FLAGS: None  WEIGHT BEARING RESTRICTIONS: No  FALLS:  Has patient fallen in last 6 months? Yes. 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   OBJECTIVE: (objective measures completed at initial evaluation unless otherwise dated)  DIAGNOSTIC FINDINGS:  08/17/23 - EXAM: RIGHT RIBS - 2 VIEW CLINICAL DATA:  Right rib pain after fall yesterday. FINDINGS:  No fracture or other bone lesions are seen involving the ribs. IMPRESSION: Negative.  COGNITION: Overall cognitive status: Within functional limits for tasks assessed   SENSATION: WFL  COORDINATION: Grossly WFL but heel to shin limited by limited hip ROM  POSTURE:  Wide BOS  MUSCLE LENGTH: Hamstrings: mild  tight B ITB: mild tight B Piriformis: mild tight B Hip flexors: mod tight  R>L Quads: mod tight R>L Heelcord:   LOWER EXTREMITY MMT:    MMT Right eval Left eval  Hip flexion 4 4  Hip extension 3+ 4-  Hip abduction 3+ 3+  Hip adduction 3+ 3+  Hip internal rotation 4 4+  Hip external rotation 4- 4-  Knee flexion 4- 4  Knee extension 4 4+  Ankle dorsiflexion 4+ 4+  Ankle plantarflexion    Ankle inversion    Ankle eversion    (Blank rows = not tested)  BED MOBILITY:  Independent  TRANSFERS: Assistive device utilized: None  Sit to stand: Complete Independence and Modified independence - patient noted to demonstrate increased genu valgum with thighs pressing against each other, more pronounced with fatigue Stand to sit: Complete Independence Chair to chair: NT Floor: NT  GAIT: Distance walked: clinic distances Assistive device utilized: None Level of assistance: Complete Independence Gait pattern: decreased stride length, lateral hip instability, lateral lean- Right, lateral lean- Left, and wide BOS Comments: increased lateral sway   STAIRS:  Level of Assistance: SBA  Stair Negotiation Technique:Forwards Alternating Pattern on ascent, Step to Pattern on descent with Single Rail on Left  Number of Stairs: 14   Height of Stairs: 7 Comments: step-to pattern on descent leading with R LE d/t bone on bone OA in R knee  FUNCTIONAL TESTS:  5 times sit to stand: 24.78 sec, >15 sec indicates risk for recurrent falls Timed up and go (TUG): 15.00 sec, >13.5 sec indicates increased risk for falls 10 meter walk test: 10.06 sec Gait speed: 3.26 ft/sec Berg Balance Scale: TBD Functional gait assessment: 14/30, < 19 = high risk fall    PATIENT SURVEYS:  ABC scale: 890 / 1600 = 55.6 %, indicates moderate level of physical functioning with an increased risk for recurrent falls   TODAY'S TREATMENT:   10/24/2023  SELF CARE:  Reviewed eval findings, related fall risk and role of PT  in addressing identified deficits. Education provided on safety with transitional movements to reduce risk for falls related to orthostatic hypotension as pt notes she sometimes gets dizzy with positional changes.   PATIENT EDUCATION:  Education details: PT eval findings, anticipated POC, and precautions related to positional transitions to reduce dizziness and risk for loss of balance related to orthostatic hypotension  Person educated: Patient Education method: Explanation Education comprehension: verbalized understanding and needs further education  HOME EXERCISE PROGRAM: TBD   ASSESSMENT:  CLINICAL IMPRESSION: SHEELA MCCULLEY is a 63 y.o. female who was referred to physical therapy for evaluation and treatment for frequent falls.  She has experienced 2 falls in the past 6 months.  Patient presents with physical impairments of B LE weakness, impaired activity tolerance, impaired standing balance, impaired ambulation, and decreased safety awareness impacting safe and independent functional mobility.  Gait speed 3.26 ft/sec consistent with community ambulator (2.62 ft/sec is needed for community access), but shy of normal walking speed of 4.37 ft/sec.  Examination revealed patient is at risk for falls and functional decline as evidenced by the following objective test measures: TUG of 15.00 sec (>13.5 sec indicates increased risk for falls), 5xSTS of 24.78 sec (>15 sec indicates increased risk for falls and decreased BLE power), and FGA score of 14/30 (<19/30 indicates high risk for falls (.  ABC scale score of 55.6% indicates a moderate level of physical functioning with an increased risk for recurrent falls.  Mckynlee will benefit from skilled PT to address above deficits to improve mobility and activity tolerance to help  reach the maximal level of functional independence and mobility. Patient demonstrates understanding of this POC and is in agreement with this plan.   OBJECTIVE IMPAIRMENTS: Abnormal  gait, decreased activity tolerance, decreased balance, decreased coordination, decreased knowledge of condition, difficulty walking, decreased strength, dizziness, impaired flexibility, improper body mechanics, and postural dysfunction.   ACTIVITY LIMITATIONS: standing, squatting, and locomotion level  PARTICIPATION LIMITATIONS: community activity  PERSONAL FACTORS: Fitness, Past/current experiences, Time since onset of injury/illness/exacerbation, and 3+ comorbidities: Anemia, HTN, thyroid  disease/hypothyroidism, RA, OA, severe obesity, SLE, GERD, R breast lumpectomy, cholecystectomy 08/11/23  are also affecting patient's functional outcome.   REHAB POTENTIAL: Good  CLINICAL DECISION MAKING: Evolving/moderate complexity  EVALUATION COMPLEXITY: Moderate   GOALS: Goals reviewed with patient? Yes  SHORT TERM GOALS: Target date: 11/28/2023  Patient will be independent with initial HEP to improve outcomes and carryover.  Baseline:  Goal status: INITIAL  2.  Patient will be educated on strategies to decrease risk of falls.  Baseline:  Goal status: INITIAL  3.  Patient will demonstrate decreased TUG time to </= 13.5 sec to decrease risk for falls with transitional mobility. Baseline: 15.0 sec Goal status: INITIAL  4.  Patient will improve 5xSTS time to </= 20 seconds for improved efficiency and safety with transfers. Baseline: 24.78 sec Goal status: INITIAL   LONG TERM GOALS: Target date: 01/16/2024  Patient will be independent with advanced/ongoing HEP to facilitate ability to maintain/progress functional gains from skilled physical therapy services. Baseline:  Goal status: INITIAL  2.  Patient will be able to ambulate 600' w/o AD on variable surfaces with good safety to access community.  Baseline:  Goal status: INITIAL  3.  Patient will be able to step up/down curb safely with LRAD for safety with community ambulation.  Baseline:  Goal status: INITIAL   4.  Patient will  demonstrate improved B LE strength to >/= 4 to 4+/5 for improved stability and ease of mobility . Baseline: Refer to above LE MMT table Goal status: INITIAL  5.  Patient will improve 5xSTS time to </= 15 seconds for improved efficiency and safety with transfers. Baseline: 24.78 sec Goal status: INITIAL   6.  Patient will improve Berg score by at least 8 points or to >/= 52/56 to improve safety and stability with ADLs in standing and reduce risk for falls. (MCID= 8 points)  Baseline: TBD Goal status: INITIAL  7.  Patient will improve FGA score to at least 19/30 to improve gait stability and reduce risk for falls. Baseline: 14/30 Goal status: INITIAL  8.  Patient will report >/= 75% on ABC scale (MCID = 19%) to demonstrate improved balance confidence with functional mobility and gait. Baseline: 890 / 1600 = 55.6 % Goal status: INITIAL   PLAN:  PT FREQUENCY: 2x/week  PT DURATION: 8-12 weeks  PLANNED INTERVENTIONS: 97164- PT Re-evaluation, 97750- Physical Performance Testing, 97110-Therapeutic exercises, 97530- Therapeutic activity, 97112- Neuromuscular re-education, 97535- Self Care, [id number]- Manual therapy, [phone]- Gait training, [phone]- Aquatic Therapy, [phone]- Electrical stimulation (unattended), 97016- Vasopneumatic device, [id number]- Ionotophoresis 4mg /ml Dexamethasone , 79439 (1-2 muscles), [id number] (3+ muscles)- Dry Needling, Patient/Family education, Balance training, Stair training, Taping, Joint mobilization, DME instructions, and Cryotherapy  PLAN FOR NEXT SESSION: Complete Berg balance scale assessment; create initial HEP for core/lumbopelvic and LE strengthening   Elijah CHRISTELLA Hidden, PT 10/24/2023, 4:51 PM

## 2023-10-24 ENCOUNTER — Ambulatory Visit: Attending: Family Medicine | Admitting: Physical Therapy

## 2023-10-24 ENCOUNTER — Other Ambulatory Visit: Payer: Self-pay

## 2023-10-24 DIAGNOSIS — R262 Difficulty in walking, not elsewhere classified: Secondary | ICD-10-CM | POA: Insufficient documentation

## 2023-10-24 DIAGNOSIS — R296 Repeated falls: Secondary | ICD-10-CM | POA: Diagnosis present

## 2023-10-24 DIAGNOSIS — R2681 Unsteadiness on feet: Secondary | ICD-10-CM | POA: Diagnosis present

## 2023-10-24 DIAGNOSIS — M6281 Muscle weakness (generalized): Secondary | ICD-10-CM | POA: Insufficient documentation

## 2023-10-26 ENCOUNTER — Encounter: Payer: Self-pay | Admitting: Physical Therapy

## 2023-10-26 ENCOUNTER — Ambulatory Visit: Admitting: Physical Therapy

## 2023-10-26 DIAGNOSIS — R262 Difficulty in walking, not elsewhere classified: Secondary | ICD-10-CM

## 2023-10-26 DIAGNOSIS — R2681 Unsteadiness on feet: Secondary | ICD-10-CM

## 2023-10-26 DIAGNOSIS — R296 Repeated falls: Secondary | ICD-10-CM

## 2023-10-26 DIAGNOSIS — M6281 Muscle weakness (generalized): Secondary | ICD-10-CM

## 2023-10-26 NOTE — Therapy (Signed)
 OUTPATIENT PHYSICAL THERAPY TREATMENT   Patient Name: [name] MRN: [id number] DOB:1960-07-15, 63 y.o., female Today's Date: 10/26/2023   END OF SESSION:  PT End of Session - 10/26/23 0903     Visit Number 2    Date for PT Re-Evaluation 01/16/24    Authorization Type [employer]    Authorization Time Period VL: 60    PT Start Time 0903   Pt arrived late   PT Stop Time 0931    PT Time Calculation (min) 28 min    Activity Tolerance Patient tolerated treatment well    Behavior During Therapy WFL for tasks assessed/performed           Past Medical History:  Diagnosis Date   Abdominal pain, other specified site    Acute upper respiratory infections of unspecified site    Allergy    seasonal   Anemia    Benign neoplasm of skin, site unspecified    Disturbance of skin sensation    Diverticulosis of colon (without mention of hemorrhage)    Family history of diabetes mellitus    Family history of ischemic heart disease    Family history of malignant neoplasm of genital organ, other    GERD (gastroesophageal reflux disease)    Hypertension    Hypokalemia    Hypothyroidism    Left shoulder pain    Low blood potassium    Post-menopausal    Rheumatoid arthritis (HCC)    Sleep apnea    wears cpap    Sprain and strain of unspecified site of knee and leg    Sprain of neck    Thyroid  disease    Unspecified essential hypertension    Past Surgical History:  Procedure Laterality Date   ABDOMINAL HYSTERECTOMY     BREAST BIOPSY Right 10/30/2021   BREAST LUMPECTOMY WITH RADIOACTIVE SEED LOCALIZATION Right 12/30/2021   Procedure: RIGHT BREAST LUMPECTOMY WITH RADIOACTIVE SEED LOCALIZATION;  Surgeon: Vanderbilt Ned, MD;  Location: [facility];  Service: General;  Laterality: Right;   BUNIONECTOMY  2005   right foot   CHOLECYSTECTOMY N/A 08/11/2023   Procedure: LAPAROSCOPIC CHOLECYSTECTOMY;  Surgeon: Vanderbilt Ned, MD;  Location: [facility];  Service: General;  Laterality:  N/A;  WITH ICG DYE   COLONOSCOPY  6-12/2008   SHOULDER SURGERY     left;spurs 2-09- dr cayetano   TOTAL ABDOMINAL HYSTERECTOMY W/ BILATERAL SALPINGOOPHORECTOMY  2002   Patient Active Problem List   Diagnosis Date Noted   Diarrhea of presumed infectious origin 04/15/2023   Preventative health care 06/09/2021   Peripheral edema 02/27/2020   OSA on CPAP 09/19/2019   Acute non-recurrent pansinusitis 04/26/2019   Counseled about COVID-19 virus infection 04/26/2019   Insulin  resistance 03/07/2019   Vitamin D  deficiency 03/07/2019   Diverticulosis 03/07/2019   Obesity with alveolar hypoventilation and body mass 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 (HCC) 03/01/2019   Excessive daytime sleepiness 03/01/2019   Class 3 severe obesity with serious comorbidity and body mass index (BMI) of 45.0 to 49.9 in adult 03/01/2019   Gastroesophageal reflux disease without esophagitis 03/01/2019   Hypothyroidism 06/15/2018   SLE (systemic lupus erythematosus related syndrome) (HCC), followed by Rheumatology, Rx Plaquenil  10/13/2017   Hyperlipidemia 01/20/2012   Osteoarthritis 04/23/2009   Pain of upper abdomen 09/13/2008   Essential hypertension 05/18/2006    PCP: Antonio Cyndee Jamee JONELLE, DO   REFERRING PROVIDER: Antonio Cyndee, Jamee JONELLE, DO  REFERRING DIAG: R29.6 (ICD-10-CM) - Falls frequently   THERAPY DIAG:  Repeated falls  Unsteadiness on feet  Muscle weakness (generalized)  Difficulty in walking, not elsewhere classified  RATIONALE FOR EVALUATION AND TREATMENT: Rehabilitation  ONSET DATE: 08/17/2023  NEXT MD VISIT: 04/30/2024   SUBJECTIVE:                                                                                                                                                                                                         SUBJECTIVE STATEMENT: Pt reporting R knee pain with walking this morning.  EVAL:  Pt reports she has felt like she has been walking leaning to 1 side or off-balance  for a while now.  On 08/17/23, 6 days after her gall bladder surgery on 08/11/23 she fell while going to her mailbox. Then after returning to work on 09/06/23, she fell again.  She cannot recall any preceding events leading to the fall (no tripping or lightheadedness).  Due to the falls she has lost her confidence with walking and now avoids walking for exercise like she used to due to fear of possibility of falling again.  PAIN: Are you having pain? Yes: NPRS scale: 3-4/10  Pain location: R knee  Pain description: punching ache when she walks  Aggravating factors: walking  Relieving factors: moving knee while sitting, Aleve    PERTINENT HISTORY:  Anemia, HTN, thyroid  disease/hypothyroidism, RA, OA - advanced R knee bone-on-bone, severe obesity, SLE, GERD, R breast lumpectomy, cholecystectomy 08/11/23   PRECAUTIONS: Fall  RED FLAGS: None  WEIGHT BEARING RESTRICTIONS: No  FALLS:  Has patient fallen in last 6 months? Yes. 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   OBJECTIVE: (objective measures completed at initial evaluation unless otherwise dated)  DIAGNOSTIC FINDINGS:  08/17/23 - EXAM: RIGHT RIBS - 2 VIEW CLINICAL DATA:  Right rib pain after fall yesterday. FINDINGS:  No fracture or other bone lesions are seen involving the ribs. IMPRESSION: Negative.  COGNITION: Overall cognitive status: Within functional limits for tasks assessed   SENSATION: WFL  COORDINATION: Grossly WFL but heel to shin limited by limited hip ROM  POSTURE:  Wide  BOS  MUSCLE LENGTH: Hamstrings: mild tight  B ITB: mild tight B Piriformis: mild tight B Hip flexors: mod tight R>L Quads: mod tight R>L Heelcord:   LOWER EXTREMITY MMT:    MMT Right eval Left eval  Hip flexion 4 4  Hip extension 3+ 4-  Hip abduction 3+ 3+  Hip adduction 3+ 3+  Hip internal rotation 4 4+  Hip external rotation 4- 4-  Knee flexion 4- 4  Knee extension 4 4+  Ankle dorsiflexion 4+ 4+  Ankle plantarflexion    Ankle inversion    Ankle eversion    (Blank rows = not tested)  BED MOBILITY:  Independent  TRANSFERS: Assistive device utilized: None  Sit to stand: Complete Independence and Modified independence - patient noted to demonstrate increased genu valgum with thighs pressing against each other, more pronounced with fatigue Stand to sit: Complete Independence Chair to chair: NT Floor: NT  GAIT: Distance walked: clinic distances Assistive device utilized: None Level of assistance: Complete Independence Gait pattern: decreased stride length, lateral hip instability, lateral lean- Right, lateral lean- Left, and wide BOS Comments: increased lateral sway   STAIRS:  Level of Assistance: SBA  Stair Negotiation Technique:Forwards Alternating Pattern on ascent, Step to Pattern on descent with Single Rail on Left  Number of Stairs: 14   Height of Stairs: 7 Comments: step-to pattern on descent leading with R LE d/t bone on bone OA in R knee  FUNCTIONAL TESTS:  5 times sit to stand: 24.78 sec, >15 sec indicates risk for recurrent falls Timed up and go (TUG): 15.00 sec, >13.5 sec indicates increased risk for falls 10 meter walk test: 10.06 sec Gait speed: 3.26 ft/sec Berg Balance Scale: 49/56, 46-51 moderate (>50%) risk for falls (10/26/23) Functional gait assessment: 14/30, < 19 = high risk fall    PATIENT SURVEYS:  ABC scale: 890 / 1600 = 55.6 %, indicates moderate level of physical functioning with an increased risk for recurrent falls   TODAY'S  TREATMENT:   10/26/2023  PHYSICAL PERFORMANCE TEST or MEASUREMENT: Berg Balance Test  Sit to Stand Able to stand without using hands and stabilize independently   Standing Unsupported Able to stand safely 2 minutes   Sitting with Back Unsupported but Feet Supported on Floor or Stool Able to sit safely and securely 2 minutes   Stand to Sit Sits safely with minimal use of hands   Transfers Able to transfer safely, minor use of hands   Standing Unsupported with Eyes Closed Able to stand 10 seconds safely   Standing Unsupported with Feet Together Able to place feet together independently and stand 1 minute safely   From Standing, Reach Forward with Outstretched Arm Can reach confidently >25 cm (10)   From Standing Position, Pick up Object from Floor Able to pick up shoe, needs supervision   From Standing Position, Turn to Look Behind Over each Shoulder Looks behind one side only/other side shows less weight shift   Turn 360 Degrees Able to turn 360 degrees safely but slowly   Standing Unsupported, Alternately Place Feet on Step/Stool Able to stand independently and complete 8 steps >20 seconds   Standing Unsupported, One Foot in Front Able to place foot tandem independently and hold 30 seconds   Standing on One Leg Able to lift leg independently and hold equal to or more than 3 seconds   Total Score 49   Berg comment: 46-51 moderate (>50%) risk for falls       THERAPEUTIC EXERCISE: To improve strength.  Demonstration, verbal and tactile cues  throughout for technique. Hooklying TrA isometric 10 x 5 Hooklying TrA + hp ADD isometric 10 x 5 Hooklying TrA + GTB alt bent-knee fallout 10 x 3-5   10/24/2023  SELF CARE:  Reviewed eval findings, related fall risk and role of PT in addressing identified deficits. Education provided on safety with transitional movements to reduce risk for falls related to orthostatic hypotension as pt notes she sometimes gets dizzy with positional changes.   PATIENT  EDUCATION:  Education details: PT eval findings and initial HEP  Person educated: Patient Education method: Explanation, Demonstration, Verbal cues, Tactile cues, Handouts, and MedBridge[employer] app access provided Education comprehension: verbalized understanding, returned demonstration, verbal cues required, tactile cues required, and needs further education  HOME EXERCISE PROGRAM: Access Code: [email or web address] URL: [email or web address] Date: 10/26/2023 Prepared by: Elijah Hidden  Exercises - Supine Transversus Abdominis Bracing - Hands on Stomach  - 2 x daily - 7 x weekly - 2 sets - 10 reps - 5 sec hold - Supine Hip Adduction Isometric with Ball  - 2 x daily - 7 x weekly - 2 sets - 10 reps - 5 sec hold - Hooklying Single Leg Bent Knee Fallouts with Resistance  - 2 x daily - 7 x weekly - 2 sets - 10 reps - 3 sec hold   ASSESSMENT:  CLINICAL IMPRESSION: Berg balance scale completed with score of 49/56 indicating moderate (>50%) risk for falls.  Visit time limited due to patient late arrival, with remainder of visit focusing in initiation of HEP.  Will review these exercises and expand upon HEP as appropriate next visit.  [name] will benefit from continued skilled PT to address above deficits to improve mobility and activity tolerance with decreased pain interference.   EVAL: [name] is a 63 y.o. female who was referred to physical therapy for evaluation and treatment for frequent falls.  She has experienced 2 falls in the past 6 months.  Patient presents with physical impairments of B LE weakness, impaired activity tolerance, impaired standing balance, impaired ambulation, and decreased safety awareness impacting safe and independent functional mobility.  Gait speed 3.26 ft/sec consistent with community ambulator (2.62 ft/sec is needed for community access), but shy of normal walking speed of 4.37 ft/sec.  Examination revealed patient is at risk for falls and functional decline as  evidenced by the following objective test measures: TUG of 15.00 sec (>13.5 sec indicates increased risk for falls), 5xSTS of 24.78 sec (>15 sec indicates increased risk for falls and decreased BLE power), and FGA score of 14/30 (<19/30 indicates high risk for falls (.  ABC scale score of 55.6% indicates a moderate level of physical functioning with an increased risk for recurrent falls.  [name] will benefit from skilled PT to address above deficits to improve mobility and activity tolerance to help reach the maximal level of functional independence and mobility. Patient demonstrates understanding of this POC and is in agreement with this plan.   OBJECTIVE IMPAIRMENTS: Abnormal gait, decreased activity tolerance, decreased balance, decreased coordination, decreased knowledge of condition, difficulty walking, decreased strength, dizziness, impaired flexibility, improper body mechanics, and postural dysfunction.   ACTIVITY LIMITATIONS: standing, squatting, and locomotion level  PARTICIPATION LIMITATIONS: community activity  PERSONAL FACTORS: Fitness, Past/current experiences, Time since onset of injury/illness/exacerbation, and 3+ comorbidities: Anemia, HTN, thyroid  disease/hypothyroidism, RA, OA, severe obesity, SLE, GERD, R breast lumpectomy, cholecystectomy 08/11/23  are also affecting patient's functional outcome.   REHAB POTENTIAL: Good  CLINICAL DECISION MAKING: Evolving/moderate complexity  EVALUATION COMPLEXITY: Moderate  GOALS: Goals reviewed with patient? Yes  SHORT TERM GOALS: Target date: 11/28/2023  Patient will be independent with initial HEP to improve outcomes and carryover.  Baseline:  Goal status: IN PROGRESS - 10/26/23 - HEP initiated today  2.  Patient will be educated on strategies to decrease risk of falls.  Baseline:  Goal status: INITIAL  3.  Patient will demonstrate decreased TUG time to </= 13.5 sec to decrease risk for falls with transitional mobility. Baseline:  15.0 sec Goal status: INITIAL  4.  Patient will improve 5xSTS time to </= 20 seconds for improved efficiency and safety with transfers. Baseline: 24.78 sec Goal status: INITIAL   LONG TERM GOALS: Target date: 01/16/2024  Patient will be independent with advanced/ongoing HEP to facilitate ability to maintain/progress functional gains from skilled physical therapy services. Baseline:  Goal status: INITIAL  2.  Patient will be able to ambulate 600' w/o AD on variable surfaces with good safety to access community.  Baseline:  Goal status: INITIAL  3.  Patient will be able to step up/down curb safely with LRAD for safety with community ambulation.  Baseline:  Goal status: INITIAL   4.  Patient will demonstrate improved B LE strength to >/= 4 to 4+/5 for improved stability and ease of mobility . Baseline: Refer to above LE MMT table Goal status: INITIAL  5.  Patient will improve 5xSTS time to </= 15 seconds for improved efficiency and safety with transfers. Baseline: 24.78 sec Goal status: INITIAL   6.  Patient will improve Berg score by at least 8 points or to >/= 52/56 to improve safety and stability with ADLs in standing and reduce risk for falls. (MCID= 8 points)  Baseline: 49/56 (10/26/23) Goal status: INITIAL  7.  Patient will improve FGA score to at least 19/30 to improve gait stability and reduce risk for falls. Baseline: 14/30 Goal status: INITIAL  8.  Patient will report >/= 75% on ABC scale (MCID = 19%) to demonstrate improved balance confidence with functional mobility and gait. Baseline: 890 / 1600 = 55.6 % Goal status: INITIAL   PLAN:  PT FREQUENCY: 2x/week  PT DURATION: 8-12 weeks  PLANNED INTERVENTIONS: 97164- PT Re-evaluation, 97750- Physical Performance Testing, 97110-Therapeutic exercises, 97530- Therapeutic activity, 97112- Neuromuscular re-education, 97535- Self Care, [id number]- Manual therapy, [phone]- Gait training, [phone]- Aquatic Therapy, [phone]- Electrical  stimulation (unattended), 97016- Vasopneumatic device, [id number]- Ionotophoresis 4mg /ml Dexamethasone , 79439 (1-2 muscles), 20561 (3+ muscles)- Dry Needling, Patient/Family education, Balance training, Stair training, Taping, Joint mobilization, DME instructions, and Cryotherapy  PLAN FOR NEXT SESSION: review and expand upon initial HEP for core/lumbopelvic and LE strengthening; provide education in fall prevention strategies; initiate balance training   Elijah CHRISTELLA Hidden, PT 10/26/2023, 3:02 PM

## 2023-11-02 ENCOUNTER — Encounter: Payer: Self-pay | Admitting: Physical Therapy

## 2023-11-02 ENCOUNTER — Ambulatory Visit: Admitting: Physical Therapy

## 2023-11-02 DIAGNOSIS — R296 Repeated falls: Secondary | ICD-10-CM

## 2023-11-02 DIAGNOSIS — R262 Difficulty in walking, not elsewhere classified: Secondary | ICD-10-CM

## 2023-11-02 DIAGNOSIS — R2681 Unsteadiness on feet: Secondary | ICD-10-CM

## 2023-11-02 DIAGNOSIS — M6281 Muscle weakness (generalized): Secondary | ICD-10-CM

## 2023-11-02 NOTE — Therapy (Signed)
 OUTPATIENT PHYSICAL THERAPY TREATMENT   Patient Name: [name] MRN: [id number] DOB:20-Feb-1961, 63 y.o., female Today's Date: 11/02/2023   END OF SESSION:  PT End of Session - 11/02/23 0853     Visit Number 3    Date for PT Re-Evaluation 01/16/24    Authorization Type [employer]    Authorization Time Period VL: 60    PT Start Time 0853    PT Stop Time 0936    PT Time Calculation (min) 43 min    Activity Tolerance Patient tolerated treatment well;Patient limited by pain    Behavior During Therapy WFL for tasks assessed/performed            Past Medical History:  Diagnosis Date   Abdominal pain, other specified site    Acute upper respiratory infections of unspecified site    Allergy    seasonal   Anemia    Benign neoplasm of skin, site unspecified    Disturbance of skin sensation    Diverticulosis of colon (without mention of hemorrhage)    Family history of diabetes mellitus    Family history of ischemic heart disease    Family history of malignant neoplasm of genital organ, other    GERD (gastroesophageal reflux disease)    Hypertension    Hypokalemia    Hypothyroidism    Left shoulder pain    Low blood potassium    Post-menopausal    Rheumatoid arthritis (HCC)    Sleep apnea    wears cpap    Sprain and strain of unspecified site of knee and leg    Sprain of neck    Thyroid  disease    Unspecified essential hypertension    Past Surgical History:  Procedure Laterality Date   ABDOMINAL HYSTERECTOMY     BREAST BIOPSY Right 10/30/2021   BREAST LUMPECTOMY WITH RADIOACTIVE SEED LOCALIZATION Right 12/30/2021   Procedure: RIGHT BREAST LUMPECTOMY WITH RADIOACTIVE SEED LOCALIZATION;  Surgeon: Vanderbilt Ned, MD;  Location: [facility];  Service: General;  Laterality: Right;   BUNIONECTOMY  2005   right foot   CHOLECYSTECTOMY N/A 08/11/2023   Procedure: LAPAROSCOPIC CHOLECYSTECTOMY;  Surgeon: Vanderbilt Ned, MD;  Location: [facility];  Service: General;   Laterality: N/A;  WITH ICG DYE   COLONOSCOPY  6-12/2008   SHOULDER SURGERY     left;spurs 2-09- dr cayetano   TOTAL ABDOMINAL HYSTERECTOMY W/ BILATERAL SALPINGOOPHORECTOMY  2002   Patient Active Problem List   Diagnosis Date Noted   Diarrhea of presumed infectious origin 04/15/2023   Preventative health care 06/09/2021   Peripheral edema 02/27/2020   OSA on CPAP 09/19/2019   Acute non-recurrent pansinusitis 04/26/2019   Counseled about COVID-19 virus infection 04/26/2019   Insulin  resistance 03/07/2019   Vitamin D  deficiency 03/07/2019   Diverticulosis 03/07/2019   Obesity with alveolar hypoventilation and body mass 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 (HCC) 03/01/2019   Excessive daytime sleepiness 03/01/2019   Class 3 severe obesity with serious comorbidity and body mass index (BMI) of 45.0 to 49.9 in adult 03/01/2019   Gastroesophageal reflux disease without esophagitis 03/01/2019   Hypothyroidism 06/15/2018   SLE (systemic lupus erythematosus related syndrome) (HCC), followed by Rheumatology, Rx Plaquenil  10/13/2017   Hyperlipidemia 01/20/2012   Osteoarthritis 04/23/2009   Pain of upper abdomen 09/13/2008   Essential hypertension 05/18/2006    PCP: Antonio Cyndee Jamee JONELLE, DO   REFERRING PROVIDER: Antonio Cyndee, Jamee JONELLE, DO  REFERRING DIAG: R29.6 (ICD-10-CM) - Falls frequently   THERAPY DIAG:  Repeated falls  Unsteadiness on feet  Muscle weakness (generalized)  Difficulty in walking, not elsewhere classified  RATIONALE FOR EVALUATION AND TREATMENT: Rehabilitation  ONSET DATE: 08/17/2023  NEXT MD VISIT: 04/30/2024   SUBJECTIVE:                                                                                                                                                                                                         SUBJECTIVE STATEMENT: Pt reports her R knee popped while walking down 2 steps into her laundry room this morning - now having pain again, although pain had  subsided after last visit.  EVAL:  Pt reports she has felt like she has been walking leaning to 1 side or off-balance for a while now.  On 08/17/23, 6 days after her gall bladder surgery on 08/11/23 she fell while going to her mailbox. Then after returning to work on 09/06/23, she fell again.  She cannot recall any preceding events leading to the fall (no tripping or lightheadedness).  Due to the falls she has lost her confidence with walking and now avoids walking for exercise like she used to due to fear of possibility of falling again.  PAIN: Are you having pain? Yes: NPRS scale: 4/10  Pain location: R knee  Pain description: punching ache when she walks  Aggravating factors: walking  Relieving factors: moving knee while sitting, Aleve    PERTINENT HISTORY:  Anemia, HTN, thyroid  disease/hypothyroidism, RA, OA - advanced R knee bone-on-bone, severe obesity, SLE, GERD, R breast lumpectomy, cholecystectomy 08/11/23   PRECAUTIONS: Fall  RED FLAGS: None  WEIGHT BEARING RESTRICTIONS: No  FALLS:  Has patient fallen in last 6 months? Yes. 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   OBJECTIVE: (objective measures completed at initial evaluation unless otherwise dated)  DIAGNOSTIC FINDINGS:  08/17/23 - EXAM: RIGHT RIBS - 2 VIEW CLINICAL DATA:  Right rib pain after fall yesterday. FINDINGS:  No fracture or other bone lesions are seen involving the ribs. IMPRESSION: Negative.  COGNITION: Overall cognitive status: Within functional limits for tasks  assessed   SENSATION: WFL  COORDINATION: Grossly [facility]  but heel to shin limited by limited hip ROM  POSTURE:  Wide BOS  MUSCLE LENGTH: Hamstrings: mild tight B ITB: mild tight B Piriformis: mild tight B Hip flexors: mod tight R>L Quads: mod tight R>L Heelcord:   LOWER EXTREMITY MMT:    MMT Right eval Left eval  Hip flexion 4 4  Hip extension 3+ 4-  Hip abduction 3+ 3+  Hip adduction 3+ 3+  Hip internal rotation 4 4+  Hip external rotation 4- 4-  Knee flexion 4- 4  Knee extension 4 4+  Ankle dorsiflexion 4+ 4+  Ankle plantarflexion    Ankle inversion    Ankle eversion    (Blank rows = not tested)  BED MOBILITY:  Independent  TRANSFERS: Assistive device utilized: None  Sit to stand: Complete Independence and Modified independence - patient noted to demonstrate increased genu valgum with thighs pressing against each other, more pronounced with fatigue Stand to sit: Complete Independence Chair to chair: NT Floor: NT  GAIT: Distance walked: clinic distances Assistive device utilized: None Level of assistance: Complete Independence Gait pattern: decreased stride length, lateral hip instability, lateral lean- Right, lateral lean- Left, and wide BOS Comments: increased lateral sway   STAIRS:  Level of Assistance: SBA  Stair Negotiation Technique:Forwards Alternating Pattern on ascent, Step to Pattern on descent with Single Rail on Left  Number of Stairs: 14   Height of Stairs: 7 Comments: step-to pattern on descent leading with R LE d/t bone on bone OA in R knee  FUNCTIONAL TESTS:  5 times sit to stand: 24.78 sec, >15 sec indicates risk for recurrent falls Timed up and go (TUG): 15.00 sec, >13.5 sec indicates increased risk for falls 10 meter walk test: 10.06 sec Gait speed: 3.26 ft/sec Berg Balance Scale: 49/56, 46-51 moderate (>50%) risk for falls (10/26/23) Functional gait assessment: 14/30, < 19 = high risk fall    PATIENT SURVEYS:  ABC scale: 890 /  1600 = 55.6 %, indicates moderate level of physical functioning with an increased risk for recurrent falls   TODAY'S TREATMENT:   11/02/2023  THERAPEUTIC EXERCISE: To improve strength and endurance.  Demonstration, verbal and tactile cues throughout for technique.  NuStep - L3 x 6 min Bridge 10 x 3, 2 sets - cues to avoid holding her breath Hooklying TrA + hip ADD isometric 10 x 5, 2 sets Standing hip ABD 3 x 10   NEUROMUSCULAR RE-EDUCATION: To improve coordination, kinesthesia, and posture.  Seated TrA + hip flexion march 2 min x 3 sets Kinesiotape - chondromalacia patella pattern to R knee - 2 I strips medial and lateral to patella originating from tibial tuberosity and meeting at distal quads + horiz strip lateral to medial over distal patella/patellar tendon  THERAPEUTIC ACTIVITIES: To improve functional performance.  Demonstration, verbal and tactile cues throughout for technique. Sit to stand with arms forward 2 x 10- pt initially limited by R knee pain, improved w/ Ktaping     10/26/2023  PHYSICAL PERFORMANCE TEST or MEASUREMENT: Berg Balance Test  Sit to Stand Able to stand without using hands and stabilize independently   Standing Unsupported Able to stand safely 2 minutes   Sitting with Back Unsupported but Feet Supported on Floor or Stool Able to sit safely and securely 2 minutes   Stand to Sit Sits safely with minimal use of hands   Transfers Able to transfer safely, minor use of hands   Standing Unsupported with Eyes Closed Able to stand 10 seconds safely  Standing Unsupported with Feet Together Able to place feet together independently and stand 1 minute safely   From Standing, Reach Forward with Outstretched Arm Can reach confidently >25 cm (10)   From Standing Position, Pick up Object from Floor Able to pick up shoe, needs supervision   From Standing Position, Turn to Look Behind Over each Shoulder Looks behind one side only/other side shows less weight shift    Turn 360 Degrees Able to turn 360 degrees safely but slowly   Standing Unsupported, Alternately Place Feet on Step/Stool Able to stand independently and complete 8 steps >20 seconds   Standing Unsupported, One Foot in Front Able to place foot tandem independently and hold 30 seconds   Standing on One Leg Able to lift leg independently and hold equal to or more than 3 seconds   Total Score 49   Berg comment: 46-51 moderate (>50%) risk for falls       THERAPEUTIC EXERCISE: To improve strength.  Demonstration, verbal and tactile cues throughout for technique. Hooklying TrA isometric 10 x 5 Hooklying TrA + hp ADD isometric 10 x 5 Hooklying TrA + GTB alt bent-knee fallout 10 x 3-5   10/24/2023  SELF CARE:  Reviewed eval findings, related fall risk and role of PT in addressing identified deficits. Education provided on safety with transitional movements to reduce risk for falls related to orthostatic hypotension as pt notes she sometimes gets dizzy with positional changes.   PATIENT EDUCATION:  Education details: HEP review and HEP update  Person educated: Patient Education method: Explanation, Demonstration, Verbal cues, and MedBridgeGO app updated Education comprehension: verbalized understanding, returned demonstration, verbal cues required, tactile cues required, and needs further education  HOME EXERCISE PROGRAM: Access Code: [email or web address] URL: [email or web address] Date: 11/02/2023 Prepared by: Elijah Hidden  Exercises - Supine Transversus Abdominis Bracing - Hands on Stomach  - 2 x daily - 7 x weekly - 2 sets - 10 reps - 5 sec hold - Supine Hip Adduction Isometric with Ball  - 2 x daily - 7 x weekly - 2 sets - 10 reps - 5 sec hold - Hooklying Single Leg Bent Knee Fallouts with Resistance  - 2 x daily - 7 x weekly - 2 sets - 10 reps - 3 sec hold - Bridge  - 1 x daily - 7 x weekly - 3 sets - 10 reps - 3-5 sec hold - Seated March  - 1 x daily - 7 x weekly - 3 sets - 10  reps - Sit to Stand Without Arm Support  - 1 x daily - 7 x weekly - 2 sets - 10 reps - 3 sec hold - Standing Hip Abduction with Counter Support  - 1 x daily - 7 x weekly - 3 sets - 10 reps - 2-3 sec hold   ASSESSMENT:  CLINICAL IMPRESSION: Today's visit focused primarily on strengthening Toluwani's core and LEs. She was able to perform each activity but c/o R knee pain w/ STS which improved after K-tape applied for support and patellar tracking. Will review new exercises added to her HEP as appropriate at her next visit. Harryette will benefit from receiving continued skilled PT to address deficits with strength, mobility, balance, and activity tolerance with decreased amount of pain.    EVAL: MORGANN WOODBURN is a 63 y.o. female who was referred to physical therapy for evaluation and treatment for frequent falls.  She has experienced 2 falls in the past 6 months.  Patient presents with physical impairments  of B LE weakness, impaired activity tolerance, impaired standing balance, impaired ambulation, and decreased safety awareness impacting safe and independent functional mobility.  Gait speed 3.26 ft/sec consistent with community ambulator (2.62 ft/sec is needed for community access), but shy of normal walking speed of 4.37 ft/sec.  Examination revealed patient is at risk for falls and functional decline as evidenced by the following objective test measures: TUG of 15.00 sec (>13.5 sec indicates increased risk for falls), 5xSTS of 24.78 sec (>15 sec indicates increased risk for falls and decreased BLE power), and FGA score of 14/30 (<19/30 indicates high risk for falls (.  ABC scale score of 55.6% indicates a moderate level of physical functioning with an increased risk for recurrent falls.  Saher will benefit from skilled PT to address above deficits to improve mobility and activity tolerance to help reach the maximal level of functional independence and mobility. Patient demonstrates understanding of this POC  and is in agreement with this plan.   OBJECTIVE IMPAIRMENTS: Abnormal gait, decreased activity tolerance, decreased balance, decreased coordination, decreased knowledge of condition, difficulty walking, decreased strength, dizziness, impaired flexibility, improper body mechanics, and postural dysfunction.   ACTIVITY LIMITATIONS: standing, squatting, and locomotion level  PARTICIPATION LIMITATIONS: community activity  PERSONAL FACTORS: Fitness, Past/current experiences, Time since onset of injury/illness/exacerbation, and 3+ comorbidities: Anemia, HTN, thyroid  disease/hypothyroidism, RA, OA, severe obesity, SLE, GERD, R breast lumpectomy, cholecystectomy 08/11/23  are also affecting patient's functional outcome.   REHAB POTENTIAL: Good  CLINICAL DECISION MAKING: Evolving/moderate complexity  EVALUATION COMPLEXITY: Moderate   GOALS: Goals reviewed with patient? Yes  SHORT TERM GOALS: Target date: 11/28/2023  Patient will be independent with initial HEP to improve outcomes and carryover.  Baseline:  10/26/23 - HEP initiated today Goal status: IN PROGRESS - 11/02/23 - HEP updated today  2.  Patient will be educated on strategies to decrease risk of falls.  Baseline:  Goal status: INITIAL  3.  Patient will demonstrate decreased TUG time to </= 13.5 sec to decrease risk for falls with transitional mobility. Baseline: 15.0 sec Goal status: INITIAL  4.  Patient will improve 5xSTS time to </= 20 seconds for improved efficiency and safety with transfers. Baseline: 24.78 sec Goal status: INITIAL   LONG TERM GOALS: Target date: 01/16/2024  Patient will be independent with advanced/ongoing HEP to facilitate ability to maintain/progress functional gains from skilled physical therapy services. Baseline:  Goal status: INITIAL  2.  Patient will be able to ambulate 600' w/o AD on variable surfaces with good safety to access community.  Baseline:  Goal status: INITIAL  3.  Patient will be  able to step up/down curb safely with LRAD for safety with community ambulation.  Baseline:  Goal status: INITIAL   4.  Patient will demonstrate improved B LE strength to >/= 4 to 4+/5 for improved stability and ease of mobility . Baseline: Refer to above LE MMT table Goal status: INITIAL  5.  Patient will improve 5xSTS time to </= 15 seconds for improved efficiency and safety with transfers. Baseline: 24.78 sec Goal status: INITIAL   6.  Patient will improve Berg score by at least 8 points or to >/= 52/56 to improve safety and stability with ADLs in standing and reduce risk for falls. (MCID= 8 points)  Baseline: 49/56 (10/26/23) Goal status: INITIAL  7.  Patient will improve FGA score to at least 19/30 to improve gait stability and reduce risk for falls. Baseline: 14/30 Goal status: INITIAL  8.  Patient will report >/=  75% on ABC scale (MCID = 19%) to demonstrate improved balance confidence with functional mobility and gait. Baseline: 890 / 1600 = 55.6 % Goal status: INITIAL   PLAN:  PT FREQUENCY: 2x/week  PT DURATION: 8-12 weeks  PLANNED INTERVENTIONS: 97164- PT Re-evaluation, 97750- Physical Performance Testing, 97110-Therapeutic exercises, 97530- Therapeutic activity, 97112- Neuromuscular re-education, 97535- Self Care, [id number]- Manual therapy, [phone]- Gait training, [phone]- Aquatic Therapy, [phone]- Electrical stimulation (unattended), 97016- Vasopneumatic device, [id number]- Ionotophoresis 4mg /ml Dexamethasone , 79439 (1-2 muscles), 20561 (3+ muscles)- Dry Needling, Patient/Family education, Balance training, Stair training, Taping, Joint mobilization, DME instructions, and Cryotherapy  PLAN FOR NEXT SESSION: review and expand upon HEP for core/lumbopelvic and LE strengthening; provide education in fall prevention strategies; initiate balance training   Elijah CHRISTELLA Hidden, PT 11/02/2023, 2:22 PM

## 2023-11-04 ENCOUNTER — Ambulatory Visit

## 2023-11-04 DIAGNOSIS — R2681 Unsteadiness on feet: Secondary | ICD-10-CM

## 2023-11-04 DIAGNOSIS — M6281 Muscle weakness (generalized): Secondary | ICD-10-CM

## 2023-11-04 DIAGNOSIS — R296 Repeated falls: Secondary | ICD-10-CM

## 2023-11-04 DIAGNOSIS — R262 Difficulty in walking, not elsewhere classified: Secondary | ICD-10-CM

## 2023-11-04 NOTE — Therapy (Signed)
 OUTPATIENT PHYSICAL THERAPY TREATMENT   Patient Name: ROLENA KNUTSON MRN: [id number] DOB:07-23-1960, 63 y.o., female Today's Date: 11/04/2023   END OF SESSION:  PT End of Session - 11/04/23 0903     Visit Number 4    Date for PT Re-Evaluation 01/16/24    Authorization Type [employer]    Authorization Time Period VL: 60    PT Start Time 0857   pt late   PT Stop Time 0930    PT Time Calculation (min) 33 min    Activity Tolerance Patient tolerated treatment well;Patient limited by pain    Behavior During Therapy WFL for tasks assessed/performed             Past Medical History:  Diagnosis Date   Abdominal pain, other specified site    Acute upper respiratory infections of unspecified site    Allergy    seasonal   Anemia    Benign neoplasm of skin, site unspecified    Disturbance of skin sensation    Diverticulosis of colon (without mention of hemorrhage)    Family history of diabetes mellitus    Family history of ischemic heart disease    Family history of malignant neoplasm of genital organ, other    GERD (gastroesophageal reflux disease)    Hypertension    Hypokalemia    Hypothyroidism    Left shoulder pain    Low blood potassium    Post-menopausal    Rheumatoid arthritis (HCC)    Sleep apnea    wears cpap    Sprain and strain of unspecified site of knee and leg    Sprain of neck    Thyroid  disease    Unspecified essential hypertension    Past Surgical History:  Procedure Laterality Date   ABDOMINAL HYSTERECTOMY     BREAST BIOPSY Right 10/30/2021   BREAST LUMPECTOMY WITH RADIOACTIVE SEED LOCALIZATION Right 12/30/2021   Procedure: RIGHT BREAST LUMPECTOMY WITH RADIOACTIVE SEED LOCALIZATION;  Surgeon: Vanderbilt Ned, MD;  Location: [facility];  Service: General;  Laterality: Right;   BUNIONECTOMY  2005   right foot   CHOLECYSTECTOMY N/A 08/11/2023   Procedure: LAPAROSCOPIC CHOLECYSTECTOMY;  Surgeon: Vanderbilt Ned, MD;  Location: [facility];  Service:  General;  Laterality: N/A;  WITH ICG DYE   COLONOSCOPY  6-12/2008   SHOULDER SURGERY     left;spurs 2-09- dr cayetano   TOTAL ABDOMINAL HYSTERECTOMY W/ BILATERAL SALPINGOOPHORECTOMY  2002   Patient Active Problem List   Diagnosis Date Noted   Diarrhea of presumed infectious origin 04/15/2023   Preventative health care 06/09/2021   Peripheral edema 02/27/2020   OSA on CPAP 09/19/2019   Acute non-recurrent pansinusitis 04/26/2019   Counseled about COVID-19 virus infection 04/26/2019   Insulin  resistance 03/07/2019   Vitamin D  deficiency 03/07/2019   Diverticulosis 03/07/2019   Obesity with alveolar hypoventilation and body mass 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 (HCC) 03/01/2019   Excessive daytime sleepiness 03/01/2019   Class 3 severe obesity with serious comorbidity and body mass index (BMI) of 45.0 to 49.9 in adult 03/01/2019   Gastroesophageal reflux disease without esophagitis 03/01/2019   Hypothyroidism 06/15/2018   SLE (systemic lupus erythematosus related syndrome) (HCC), followed by Rheumatology, Rx Plaquenil  10/13/2017   Hyperlipidemia 01/20/2012   Osteoarthritis 04/23/2009   Pain of upper abdomen 09/13/2008   Essential hypertension 05/18/2006    PCP: Antonio Cyndee Jamee JONELLE, DO   REFERRING PROVIDER: Antonio Cyndee, Jamee JONELLE, DO  REFERRING DIAG: R29.6 (ICD-10-CM) - Falls frequently  THERAPY DIAG:  Repeated falls  Unsteadiness on feet  Muscle weakness (generalized)  Difficulty in walking, not elsewhere classified  RATIONALE FOR EVALUATION AND TREATMENT: Rehabilitation  ONSET DATE: 08/17/2023  NEXT MD VISIT: 04/30/2024   SUBJECTIVE:                                                                                                                                                                                                         SUBJECTIVE STATEMENT: Pt reports she is sore from exercises today, still pain in R knee.  EVAL:  Pt reports she has felt like she has been  walking leaning to 1 side or off-balance for a while now.  On 08/17/23, 6 days after her gall bladder surgery on 08/11/23 she fell while going to her mailbox. Then after returning to work on 09/06/23, she fell again.  She cannot recall any preceding events leading to the fall (no tripping or lightheadedness).  Due to the falls she has lost her confidence with walking and now avoids walking for exercise like she used to due to fear of possibility of falling again.  PAIN: Are you having pain? Yes: NPRS scale: 3-4/10  Pain location: R knee  Pain description: punching ache when she walks  Aggravating factors: walking  Relieving factors: moving knee while sitting, Aleve    PERTINENT HISTORY:  Anemia, HTN, thyroid  disease/hypothyroidism, RA, OA - advanced R knee bone-on-bone, severe obesity, SLE, GERD, R breast lumpectomy, cholecystectomy 08/11/23   PRECAUTIONS: Fall  RED FLAGS: None  WEIGHT BEARING RESTRICTIONS: No  FALLS:  Has patient fallen in last 6 months? Yes. 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   OBJECTIVE: (objective measures completed at initial evaluation unless otherwise dated)  DIAGNOSTIC FINDINGS:  08/17/23 - EXAM: RIGHT RIBS - 2 VIEW CLINICAL DATA:  Right rib pain after fall yesterday. FINDINGS:  No fracture or other bone lesions are seen involving the ribs. IMPRESSION: Negative.  COGNITION: Overall cognitive status: Within functional limits for tasks assessed   SENSATION: WFL  COORDINATION: Grossly WFL but heel to shin  limited by limited hip ROM  POSTURE:  Wide  BOS  MUSCLE LENGTH: Hamstrings: mild tight B ITB: mild tight B Piriformis: mild tight B Hip flexors: mod tight R>L Quads: mod tight R>L Heelcord:   LOWER EXTREMITY MMT:    MMT Right eval Left eval  Hip flexion 4 4  Hip extension 3+ 4-  Hip abduction 3+ 3+  Hip adduction 3+ 3+  Hip internal rotation 4 4+  Hip external rotation 4- 4-  Knee flexion 4- 4  Knee extension 4 4+  Ankle dorsiflexion 4+ 4+  Ankle plantarflexion    Ankle inversion    Ankle eversion    (Blank rows = not tested)  BED MOBILITY:  Independent  TRANSFERS: Assistive device utilized: None  Sit to stand: Complete Independence and Modified independence - patient noted to demonstrate increased genu valgum with thighs pressing against each other, more pronounced with fatigue Stand to sit: Complete Independence Chair to chair: NT Floor: NT  GAIT: Distance walked: clinic distances Assistive device utilized: None Level of assistance: Complete Independence Gait pattern: decreased stride length, lateral hip instability, lateral lean- Right, lateral lean- Left, and wide BOS Comments: increased lateral sway   STAIRS:  Level of Assistance: SBA  Stair Negotiation Technique:Forwards Alternating Pattern on ascent, Step to Pattern on descent with Single Rail on Left  Number of Stairs: 14   Height of Stairs: 7 Comments: step-to pattern on descent leading with R LE d/t bone on bone OA in R knee  FUNCTIONAL TESTS:  5 times sit to stand: 24.78 sec, >15 sec indicates risk for recurrent falls Timed up and go (TUG): 15.00 sec, >13.5 sec indicates increased risk for falls 10 meter walk test: 10.06 sec Gait speed: 3.26 ft/sec Berg Balance Scale: 49/56, 46-51 moderate (>50%) risk for falls (10/26/23) Functional gait assessment: 14/30, < 19 = high risk fall    PATIENT SURVEYS:  ABC scale: 890 / 1600 = 55.6 %, indicates moderate level of physical functioning with an  increased risk for recurrent falls   TODAY'S TREATMENT:  11/04/23 NEUROMUSCULAR RE-EDUCATION: To improve coordination, kinesthesia, posture, and proprioception.  Standing hip abduction 10 BLE Standing hip extension x 10 BLE Standing HS curls x 10 BLE Standing marches x 10 BLE Bridges + TrA set x 20  THERAPEUTIC EXERCISE: To improve strength, endurance, ROM, and flexibility.  Nustep L3x23min SLR BLE x 10 Supine LTR x 10 B  11/02/2023  THERAPEUTIC EXERCISE: To improve strength and endurance.  Demonstration, verbal and tactile cues throughout for technique.  NuStep - L3 x 6 min Bridge 10 x 3, 2 sets - cues to avoid holding her breath Hooklying TrA + hip ADD isometric 10 x 5, 2 sets Standing hip ABD 3 x 10   NEUROMUSCULAR RE-EDUCATION: To improve coordination, kinesthesia, and posture.  Seated TrA + hip flexion march 2 min x 3 sets Kinesiotape - chondromalacia patella pattern to R knee - 2 I strips medial and lateral to patella originating from tibial tuberosity and meeting at distal quads + horiz strip lateral to medial over distal patella/patellar tendon  THERAPEUTIC ACTIVITIES: To improve functional performance.  Demonstration, verbal and tactile cues throughout for technique. Sit to stand with arms forward 2 x 10- pt initially limited by R knee pain, improved w/ Ktaping     10/26/2023  PHYSICAL PERFORMANCE TEST or MEASUREMENT: Berg Balance Test  Sit to Stand Able to stand without using hands and stabilize independently   Standing Unsupported Able to stand safely 2 minutes   Sitting with Back Unsupported but Feet Supported on Floor  or Stool Able to sit safely and securely 2 minutes   Stand to Sit Sits safely with minimal use of hands   Transfers Able to transfer safely, minor use of hands   Standing Unsupported with Eyes Closed Able to stand 10 seconds safely   Standing Unsupported with Feet Together Able to place feet together independently and stand 1 minute safely   From  Standing, Reach Forward with Outstretched Arm Can reach confidently >25 cm (10)   From Standing Position, Pick up Object from Floor Able to pick up shoe, needs supervision   From Standing Position, Turn to Look Behind Over each Shoulder Looks behind one side only/other side shows less weight shift   Turn 360 Degrees Able to turn 360 degrees safely but slowly   Standing Unsupported, Alternately Place Feet on Step/Stool Able to stand independently and complete 8 steps >20 seconds   Standing Unsupported, One Foot in Front Able to place foot tandem independently and hold 30 seconds   Standing on One Leg Able to lift leg independently and hold equal to or more than 3 seconds   Total Score 49   Berg comment: 46-51 moderate (>50%) risk for falls       THERAPEUTIC EXERCISE: To improve strength.  Demonstration, verbal and tactile cues throughout for technique. Hooklying TrA isometric 10 x 5 Hooklying TrA + hp ADD isometric 10 x 5 Hooklying TrA + GTB alt bent-knee fallout 10 x 3-5   10/24/2023  SELF CARE:  Reviewed eval findings, related fall risk and role of PT in addressing identified deficits. Education provided on safety with transitional movements to reduce risk for falls related to orthostatic hypotension as pt notes she sometimes gets dizzy with positional changes.   PATIENT EDUCATION:  Education details: HEP review and HEP update  Person educated: Patient Education method: Explanation, Demonstration, Verbal cues, and MedBridgeGO app updated Education comprehension: verbalized understanding, returned demonstration, verbal cues required, tactile cues required, and needs further education  HOME EXERCISE PROGRAM: Access Code: [email or web address] URL: [email or web address] Date: 11/04/2023 Prepared by: Sol Gaskins  Exercises - Supine Transversus Abdominis Bracing - Hands on Stomach  - 2 x daily - 7 x weekly - 2 sets - 10 reps - 5 sec hold - Supine Hip Adduction Isometric with Ball   - 2 x daily - 7 x weekly - 2 sets - 10 reps - 5 sec hold - Hooklying Single Leg Bent Knee Fallouts with Resistance  - 2 x daily - 7 x weekly - 2 sets - 10 reps - 3 sec hold - Bridge  - 1 x daily - 7 x weekly - 3 sets - 10 reps - 3-5 sec hold - Seated March  - 1 x daily - 7 x weekly - 3 sets - 10 reps - Sit to Stand Without Arm Support  - 1 x daily - 7 x weekly - 2 sets - 10 reps - 3 sec hold - Standing Hip Abduction with Counter Support  - 1 x daily - 7 x weekly - 2 sets - 10 reps - 2-3 sec hold - Standing Hip Extension with Counter Support  - 1 x daily - 7 x weekly - 2 sets - 10 reps - Standing Marching  - 1 x daily - 7 x weekly - 2 sets - 10 reps   ASSESSMENT:  CLINICAL IMPRESSION: Advanced work on hip strengthening in standing to improve muscular co-contractions. We progressed her HEP for more standing exercises. She noted the tape help a  little bit but after having to climb many stairs at work her pain increased again. Carlene will benefit from receiving continued skilled PT to address deficits with strength, mobility, balance, and activity tolerance with decreased amount of pain.    EVAL: DARE SPILLMAN is a 63 y.o. female who was referred to physical therapy for evaluation and treatment for frequent falls.  She has experienced 2 falls in the past 6 months.  Patient presents with physical impairments of B LE weakness, impaired activity tolerance, impaired standing balance, impaired ambulation, and decreased safety awareness impacting safe and independent functional mobility.  Gait speed 3.26 ft/sec consistent with community ambulator (2.62 ft/sec is needed for community access), but shy of normal walking speed of 4.37 ft/sec.  Examination revealed patient is at risk for falls and functional decline as evidenced by the following objective test measures: TUG of 15.00 sec (>13.5 sec indicates increased risk for falls), 5xSTS of 24.78 sec (>15 sec indicates increased risk for falls and decreased BLE  power), and FGA score of 14/30 (<19/30 indicates high risk for falls (.  ABC scale score of 55.6% indicates a moderate level of physical functioning with an increased risk for recurrent falls.  Shaterria will benefit from skilled PT to address above deficits to improve mobility and activity tolerance to help reach the maximal level of functional independence and mobility. Patient demonstrates understanding of this POC and is in agreement with this plan.   OBJECTIVE IMPAIRMENTS: Abnormal gait, decreased activity tolerance, decreased balance, decreased coordination, decreased knowledge of condition, difficulty walking, decreased strength, dizziness, impaired flexibility, improper body mechanics, and postural dysfunction.   ACTIVITY LIMITATIONS: standing, squatting, and locomotion level  PARTICIPATION LIMITATIONS: community activity  PERSONAL FACTORS: Fitness, Past/current experiences, Time since onset of injury/illness/exacerbation, and 3+ comorbidities: Anemia, HTN, thyroid  disease/hypothyroidism, RA, OA, severe obesity, SLE, GERD, R breast lumpectomy, cholecystectomy 08/11/23  are also affecting patient's functional outcome.   REHAB POTENTIAL: Good  CLINICAL DECISION MAKING: Evolving/moderate complexity  EVALUATION COMPLEXITY: Moderate   GOALS: Goals reviewed with patient? Yes  SHORT TERM GOALS: Target date: 11/28/2023  Patient will be independent with initial HEP to improve outcomes and carryover.  Baseline:  10/26/23 - HEP initiated today Goal status: IN PROGRESS - 11/02/23 - HEP updated today  2.  Patient will be educated on strategies to decrease risk of falls.  Baseline:  Goal status: INITIAL  3.  Patient will demonstrate decreased TUG time to </= 13.5 sec to decrease risk for falls with transitional mobility. Baseline: 15.0 sec Goal status: INITIAL  4.  Patient will improve 5xSTS time to </= 20 seconds for improved efficiency and safety with transfers. Baseline: 24.78 sec Goal  status: INITIAL   LONG TERM GOALS: Target date: 01/16/2024  Patient will be independent with advanced/ongoing HEP to facilitate ability to maintain/progress functional gains from skilled physical therapy services. Baseline:  Goal status: INITIAL  2.  Patient will be able to ambulate 600' w/o AD on variable surfaces with good safety to access community.  Baseline:  Goal status: INITIAL  3.  Patient will be able to step up/down curb safely with LRAD for safety with community ambulation.  Baseline:  Goal status: INITIAL   4.  Patient will demonstrate improved B LE strength to >/= 4 to 4+/5 for improved stability and ease of mobility . Baseline: Refer to above LE MMT table Goal status: INITIAL  5.  Patient will improve 5xSTS time to </= 15 seconds for improved efficiency and safety with transfers. Baseline:  24.78 sec Goal status: INITIAL   6.  Patient will improve Berg score by at least 8 points or to >/= 52/56 to improve safety and stability with ADLs in standing and reduce risk for falls. (MCID= 8 points)  Baseline: 49/56 (10/26/23) Goal status: INITIAL  7.  Patient will improve FGA score to at least 19/30 to improve gait stability and reduce risk for falls. Baseline: 14/30 Goal status: INITIAL  8.  Patient will report >/= 75% on ABC scale (MCID = 19%) to demonstrate improved balance confidence with functional mobility and gait. Baseline: 890 / 1600 = 55.6 % Goal status: INITIAL   PLAN:  PT FREQUENCY: 2x/week  PT DURATION: 8-12 weeks  PLANNED INTERVENTIONS: 97164- PT Re-evaluation, 97750- Physical Performance Testing, 97110-Therapeutic exercises, 97530- Therapeutic activity, 97112- Neuromuscular re-education, 97535- Self Care, [id number]- Manual therapy, [phone]- Gait training, [phone]- Aquatic Therapy, [phone]- Electrical stimulation (unattended), 97016- Vasopneumatic device, [id number]- Ionotophoresis 4mg /ml Dexamethasone , 79439 (1-2 muscles), 20561 (3+ muscles)- Dry Needling, Patient/Family  education, Balance training, Stair training, Taping, Joint mobilization, DME instructions, and Cryotherapy  PLAN FOR NEXT SESSION: review and expand upon HEP for core/lumbopelvic and LE strengthening; provide education in fall prevention strategies; initiate balance training   Sol LITTIE Gaskins, PTA 11/04/2023, 9:54 AM

## 2023-11-07 ENCOUNTER — Encounter: Payer: Self-pay | Admitting: Physical Therapy

## 2023-11-07 ENCOUNTER — Ambulatory Visit: Admitting: Physical Therapy

## 2023-11-07 DIAGNOSIS — R296 Repeated falls: Secondary | ICD-10-CM | POA: Diagnosis not present

## 2023-11-07 DIAGNOSIS — M6281 Muscle weakness (generalized): Secondary | ICD-10-CM

## 2023-11-07 DIAGNOSIS — R2681 Unsteadiness on feet: Secondary | ICD-10-CM

## 2023-11-07 DIAGNOSIS — R262 Difficulty in walking, not elsewhere classified: Secondary | ICD-10-CM

## 2023-11-07 NOTE — Therapy (Signed)
 OUTPATIENT PHYSICAL THERAPY TREATMENT   Patient Name: [name] MRN: [id number] DOB:Jul 13, 1960, 63 y.o., female Today's Date: 11/07/2023   END OF SESSION:  PT End of Session - 11/07/23 1538     Visit Number 5    Date for PT Re-Evaluation 01/16/24    Authorization Type [employer]    Authorization Time Period VL: 60    PT Start Time 1537    PT Stop Time 1617    PT Time Calculation (min) 40 min    Activity Tolerance Patient tolerated treatment well;Patient limited by pain    Behavior During Therapy WFL for tasks assessed/performed             Past Medical History:  Diagnosis Date   Abdominal pain, other specified site    Acute upper respiratory infections of unspecified site    Allergy    seasonal   Anemia    Benign neoplasm of skin, site unspecified    Disturbance of skin sensation    Diverticulosis of colon (without mention of hemorrhage)    Family history of diabetes mellitus    Family history of ischemic heart disease    Family history of malignant neoplasm of genital organ, other    GERD (gastroesophageal reflux disease)    Hypertension    Hypokalemia    Hypothyroidism    Left shoulder pain    Low blood potassium    Post-menopausal    Rheumatoid arthritis (HCC)    Sleep apnea    wears cpap    Sprain and strain of unspecified site of knee and leg    Sprain of neck    Thyroid  disease    Unspecified essential hypertension    Past Surgical History:  Procedure Laterality Date   ABDOMINAL HYSTERECTOMY     BREAST BIOPSY Right 10/30/2021   BREAST LUMPECTOMY WITH RADIOACTIVE SEED LOCALIZATION Right 12/30/2021   Procedure: RIGHT BREAST LUMPECTOMY WITH RADIOACTIVE SEED LOCALIZATION;  Surgeon: Vanderbilt Ned, MD;  Location: [facility];  Service: General;  Laterality: Right;   BUNIONECTOMY  2005   right foot   CHOLECYSTECTOMY N/A 08/11/2023   Procedure: LAPAROSCOPIC CHOLECYSTECTOMY;  Surgeon: Vanderbilt Ned, MD;  Location: [facility];  Service: General;   Laterality: N/A;  WITH ICG DYE   COLONOSCOPY  6-12/2008   SHOULDER SURGERY     left;spurs 2-09- dr cayetano   TOTAL ABDOMINAL HYSTERECTOMY W/ BILATERAL SALPINGOOPHORECTOMY  2002   Patient Active Problem List   Diagnosis Date Noted   Diarrhea of presumed infectious origin 04/15/2023   Preventative health care 06/09/2021   Peripheral edema 02/27/2020   OSA on CPAP 09/19/2019   Acute non-recurrent pansinusitis 04/26/2019   Counseled about COVID-19 virus infection 04/26/2019   Insulin  resistance 03/07/2019   Vitamin D  deficiency 03/07/2019   Diverticulosis 03/07/2019   Obesity with alveolar hypoventilation and body mass 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 (HCC) 03/01/2019   Excessive daytime sleepiness 03/01/2019   Class 3 severe obesity with serious comorbidity and body mass index (BMI) of 45.0 to 49.9 in adult 03/01/2019   Gastroesophageal reflux disease without esophagitis 03/01/2019   Hypothyroidism 06/15/2018   SLE (systemic lupus erythematosus related syndrome) (HCC), followed by Rheumatology, Rx Plaquenil  10/13/2017   Hyperlipidemia 01/20/2012   Osteoarthritis 04/23/2009   Pain of upper abdomen 09/13/2008   Essential hypertension 05/18/2006    PCP: Antonio Cyndee Jamee JONELLE, DO   REFERRING PROVIDER: Antonio Cyndee, Jamee JONELLE, DO  REFERRING DIAG: R29.6 (ICD-10-CM) - Falls frequently   THERAPY DIAG:  Repeated falls  Unsteadiness on feet  Muscle weakness (generalized)  Difficulty in walking, not elsewhere classified  RATIONALE FOR EVALUATION AND TREATMENT: Rehabilitation  ONSET DATE: 08/17/2023  NEXT MD VISIT: 04/30/2024   SUBJECTIVE:                                                                                                                                                                                                         SUBJECTIVE STATEMENT: Pt reports that her knee is feeling good today. Pt reports that her exercises are still going well. States that her soreness is  starting to get better the more she does her exercises.   EVAL:  Pt reports she has felt like she has been walking leaning to 1 side or off-balance for a while now.  On 08/17/23, 6 days after her gall bladder surgery on 08/11/23 she fell while going to her mailbox. Then after returning to work on 09/06/23, she fell again.  She cannot recall any preceding events leading to the fall (no tripping or lightheadedness).  Due to the falls she has lost her confidence with walking and now avoids walking for exercise like she used to due to fear of possibility of falling again.  PAIN: Are you having pain? Yes: NPRS scale: 3-4/10  Pain location: R knee  Pain description: punching ache when she walks  Aggravating factors: walking  Relieving factors: moving knee while sitting, Aleve    PERTINENT HISTORY:  Anemia, HTN, thyroid  disease/hypothyroidism, RA, OA - advanced R knee bone-on-bone, severe obesity, SLE, GERD, R breast lumpectomy, cholecystectomy 08/11/23   PRECAUTIONS: Fall  RED FLAGS: None  WEIGHT BEARING RESTRICTIONS: No  FALLS:  Has patient fallen in last 6 months? Yes. 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   OBJECTIVE: (objective measures completed at initial evaluation unless otherwise dated)  DIAGNOSTIC FINDINGS:  08/17/23 - EXAM: RIGHT RIBS - 2 VIEW CLINICAL DATA:  Right rib pain after fall yesterday. FINDINGS:  No fracture or other bone lesions are seen involving the ribs. IMPRESSION: Negative.  COGNITION: Overall cognitive status: Within  functional limits for tasks assessed   SENSATION:  WFL  COORDINATION: Grossly WFL but heel to shin limited by limited hip ROM  POSTURE:  Wide BOS  MUSCLE LENGTH: Hamstrings: mild tight B ITB: mild tight B Piriformis: mild tight B Hip flexors: mod tight R>L Quads: mod tight R>L Heelcord:   LOWER EXTREMITY MMT:    MMT Right eval Left eval  Hip flexion 4 4  Hip extension 3+ 4-  Hip abduction 3+ 3+  Hip adduction 3+ 3+  Hip internal rotation 4 4+  Hip external rotation 4- 4-  Knee flexion 4- 4  Knee extension 4 4+  Ankle dorsiflexion 4+ 4+  Ankle plantarflexion    Ankle inversion    Ankle eversion    (Blank rows = not tested)  BED MOBILITY:  Independent  TRANSFERS: Assistive device utilized: None  Sit to stand: Complete Independence and Modified independence - patient noted to demonstrate increased genu valgum with thighs pressing against each other, more pronounced with fatigue Stand to sit: Complete Independence Chair to chair: NT Floor: NT  GAIT: Distance walked: clinic distances Assistive device utilized: None Level of assistance: Complete Independence Gait pattern: decreased stride length, lateral hip instability, lateral lean- Right, lateral lean- Left, and wide BOS Comments: increased lateral sway   STAIRS:  Level of Assistance: SBA  Stair Negotiation Technique:Forwards Alternating Pattern on ascent, Step to Pattern on descent with Single Rail on Left  Number of Stairs: 14   Height of Stairs: 7 Comments: step-to pattern on descent leading with R LE d/t bone on bone OA in R knee  FUNCTIONAL TESTS:  5 times sit to stand: 24.78 sec, >15 sec indicates risk for recurrent falls Timed up and go (TUG): 15.00 sec, >13.5 sec indicates increased risk for falls 10 meter walk test: 10.06 sec Gait speed: 3.26 ft/sec Berg Balance Scale: 49/56, 46-51 moderate (>50%) risk for falls (10/26/23) Functional gait assessment: 14/30, < 19 = high risk fall    PATIENT  SURVEYS:  ABC scale: 890 / 1600 = 55.6 %, indicates moderate level of physical functioning with an increased risk for recurrent falls   TODAY'S TREATMENT:  11/07/23 THERAPEUTIC EXERCISE: To improve strength, endurance, ROM, and flexibility.  NuStep L3x6 min  LTRs- w/ 3s holds  SLR BLE x 10 ea   NEUROMUSCULAR RE-EDUCATION: To improve coordination, kinesthesia, posture, and proprioception.  Bridges + TrA + abd- 2x10  Standing hip extension x 10 BLE Standing hip abduction 10 BLE Standing Marches w/ 3# ankle wts- 3x1'  Side Steps (hallway) w/ 3# ankle wts - 4x10 ft STS x10- pt instructed to stagger L foot slightly in front of R to offload RLE  Kinesiotape - chondromalacia patella pattern to R knee - 2 I strips medial and lateral to patella originating from tibial tuberosity and meeting at distal quads + horiz strip lateral to medial over distal patella/patellar tendon  11/04/23 NEUROMUSCULAR RE-EDUCATION: To improve coordination, kinesthesia, posture, and proprioception.  Standing hip abduction 10 BLE Standing hip extension x 10 BLE Standing HS curls x 10 BLE Standing marches x 10 BLE Bridges + TrA set x 20  THERAPEUTIC EXERCISE: To improve strength, endurance, ROM, and flexibility.  Nustep L3x52min SLR BLE x 10 Supine LTR x 10 B  11/02/2023  THERAPEUTIC EXERCISE: To improve strength and endurance.  Demonstration, verbal and tactile cues throughout for technique.  NuStep - L3 x 6 min Bridge 10 x 3, 2 sets - cues to avoid holding her breath Hooklying TrA + hip ADD isometric 10 x 5, 2 sets Standing  hip ABD 3 x 10   NEUROMUSCULAR RE-EDUCATION: To improve coordination, kinesthesia, and posture.  Seated TrA + hip flexion march 2 min x 3 sets Kinesiotape - chondromalacia patella pattern to R knee - 2 I strips medial and lateral to patella originating from tibial tuberosity and meeting at distal quads + horiz strip lateral to medial over distal patella/patellar  tendon  THERAPEUTIC ACTIVITIES: To improve functional performance.  Demonstration, verbal and tactile cues throughout for technique. Sit to stand with arms forward 2 x 10- pt initially limited by R knee pain, improved w/ Ktaping     10/26/2023  PHYSICAL PERFORMANCE TEST or MEASUREMENT: Berg Balance Test  Sit to Stand Able to stand without using hands and stabilize independently   Standing Unsupported Able to stand safely 2 minutes   Sitting with Back Unsupported but Feet Supported on Floor or Stool Able to sit safely and securely 2 minutes   Stand to Sit Sits safely with minimal use of hands   Transfers Able to transfer safely, minor use of hands   Standing Unsupported with Eyes Closed Able to stand 10 seconds safely   Standing Unsupported with Feet Together Able to place feet together independently and stand 1 minute safely   From Standing, Reach Forward with Outstretched Arm Can reach confidently >25 cm (10)   From Standing Position, Pick up Object from Floor Able to pick up shoe, needs supervision   From Standing Position, Turn to Look Behind Over each Shoulder Looks behind one side only/other side shows less weight shift   Turn 360 Degrees Able to turn 360 degrees safely but slowly   Standing Unsupported, Alternately Place Feet on Step/Stool Able to stand independently and complete 8 steps >20 seconds   Standing Unsupported, One Foot in Front Able to place foot tandem independently and hold 30 seconds   Standing on One Leg Able to lift leg independently and hold equal to or more than 3 seconds   Total Score 49   Berg comment: 46-51 moderate (>50%) risk for falls       THERAPEUTIC EXERCISE: To improve strength.  Demonstration, verbal and tactile cues throughout for technique. Hooklying TrA isometric 10 x 5 Hooklying TrA + hp ADD isometric 10 x 5 Hooklying TrA + GTB alt bent-knee fallout 10 x 3-5   10/24/2023  SELF CARE:  Reviewed eval findings, related fall risk and role of PT  in addressing identified deficits. Education provided on safety with transitional movements to reduce risk for falls related to orthostatic hypotension as pt notes she sometimes gets dizzy with positional changes.   PATIENT EDUCATION:  Education details: HEP review and HEP update  Person educated: Patient Education method: Explanation, Demonstration, Verbal cues, and MedBridge[employer] app updated Education comprehension: verbalized understanding, returned demonstration, verbal cues required, tactile cues required, and needs further education  HOME EXERCISE PROGRAM: Access Code: [email or web address] URL: [email or web address] Date: 11/04/2023 Prepared by: Braylin Clark  Exercises - Supine Transversus Abdominis Bracing - Hands on Stomach  - 2 x daily - 7 x weekly - 2 sets - 10 reps - 5 sec hold - Supine Hip Adduction Isometric with Ball  - 2 x daily - 7 x weekly - 2 sets - 10 reps - 5 sec hold - Hooklying Single Leg Bent Knee Fallouts with Resistance  - 2 x daily - 7 x weekly - 2 sets - 10 reps - 3 sec hold - Bridge  - 1 x daily - 7 x weekly - 3 sets - 10  reps - 3-5 sec hold - Seated March  - 1 x daily - 7 x weekly - 3 sets - 10 reps - Sit to Stand Without Arm Support  - 1 x daily - 7 x weekly - 2 sets - 10 reps - 3 sec hold - Standing Hip Abduction with Counter Support  - 1 x daily - 7 x weekly - 2 sets - 10 reps - 2-3 sec hold - Standing Hip Extension with Counter Support  - 1 x daily - 7 x weekly - 2 sets - 10 reps - Standing Marching  - 1 x daily - 7 x weekly - 2 sets - 10 reps   ASSESSMENT:  CLINICAL IMPRESSION: Continued working on both hip and core strengthening in today's session. Pt was able to perform all exercises today w/ no complaints of inc'd pain in R knee until the end of today's session. Pt expressed some discomfort with STS. Pt had her knee Ktaped again today since she stated that helps. Pt was instructed on Ktaping on her own at home. [name] will benefit from skilled PT  intervention to address deficits with strength, mobility, balance, and activity tolerance w/ dec'd amount of pain.     EVAL: [name] is a 64 y.o. female who was referred to physical therapy for evaluation and treatment for frequent falls.  She has experienced 2 falls in the past 6 months.  Patient presents with physical impairments of B LE weakness, impaired activity tolerance, impaired standing balance, impaired ambulation, and decreased safety awareness impacting safe and independent functional mobility.  Gait speed 3.26 ft/sec consistent with community ambulator (2.62 ft/sec is needed for community access), but shy of normal walking speed of 4.37 ft/sec.  Examination revealed patient is at risk for falls and functional decline as evidenced by the following objective test measures: TUG of 15.00 sec (>13.5 sec indicates increased risk for falls), 5xSTS of 24.78 sec (>15 sec indicates increased risk for falls and decreased BLE power), and FGA score of 14/30 (<19/30 indicates high risk for falls (.  ABC scale score of 55.6% indicates a moderate level of physical functioning with an increased risk for recurrent falls.  [name] will benefit from skilled PT to address above deficits to improve mobility and activity tolerance to help reach the maximal level of functional independence and mobility. Patient demonstrates understanding of this POC and is in agreement with this plan.   OBJECTIVE IMPAIRMENTS: Abnormal gait, decreased activity tolerance, decreased balance, decreased coordination, decreased knowledge of condition, difficulty walking, decreased strength, dizziness, impaired flexibility, improper body mechanics, and postural dysfunction.   ACTIVITY LIMITATIONS: standing, squatting, and locomotion level  PARTICIPATION LIMITATIONS: community activity  PERSONAL FACTORS: Fitness, Past/current experiences, Time since onset of injury/illness/exacerbation, and 3+ comorbidities: Anemia, HTN, thyroid   disease/hypothyroidism, RA, OA, severe obesity, SLE, GERD, R breast lumpectomy, cholecystectomy 08/11/23  are also affecting patient's functional outcome.   REHAB POTENTIAL: Good  CLINICAL DECISION MAKING: Evolving/moderate complexity  EVALUATION COMPLEXITY: Moderate   GOALS: Goals reviewed with patient? Yes  SHORT TERM GOALS: Target date: 11/28/2023  Patient will be independent with initial HEP to improve outcomes and carryover.  Baseline:  10/26/23 - HEP initiated today Goal status: IN PROGRESS - 11/02/23 - HEP updated today  2.  Patient will be educated on strategies to decrease risk of falls.  Baseline:  Goal status: INITIAL  3.  Patient will demonstrate decreased TUG time to </= 13.5 sec to decrease risk for falls with transitional mobility. Baseline: 15.0  sec Goal status: INITIAL  4.  Patient will improve 5xSTS time to </= 20 seconds for improved efficiency and safety with transfers. Baseline: 24.78 sec Goal status: INITIAL   LONG TERM GOALS: Target date: 01/16/2024  Patient will be independent with advanced/ongoing HEP to facilitate ability to maintain/progress functional gains from skilled physical therapy services. Baseline:  Goal status: INITIAL  2.  Patient will be able to ambulate 600' w/o AD on variable surfaces with good safety to access community.  Baseline:  Goal status: INITIAL  3.  Patient will be able to step up/down curb safely with LRAD for safety with community ambulation.  Baseline:  Goal status: INITIAL   4.  Patient will demonstrate improved B LE strength to >/= 4 to 4+/5 for improved stability and ease of mobility . Baseline: Refer to above LE MMT table Goal status: INITIAL  5.  Patient will improve 5xSTS time to </= 15 seconds for improved efficiency and safety with transfers. Baseline: 24.78 sec Goal status: INITIAL   6.  Patient will improve Berg score by at least 8 points or to >/= 52/56 to improve safety and stability with ADLs in standing  and reduce risk for falls. (MCID= 8 points)  Baseline: 49/56 (10/26/23) Goal status: INITIAL  7.  Patient will improve FGA score to at least 19/30 to improve gait stability and reduce risk for falls. Baseline: 14/30 Goal status: INITIAL  8.  Patient will report >/= 75% on ABC scale (MCID = 19%) to demonstrate improved balance confidence with functional mobility and gait. Baseline: 890 / 1600 = 55.6 % Goal status: INITIAL   PLAN:  PT FREQUENCY: 2x/week  PT DURATION: 8-12 weeks  PLANNED INTERVENTIONS: 97164- PT Re-evaluation, 97750- Physical Performance Testing, 97110-Therapeutic exercises, 97530- Therapeutic activity, 97112- Neuromuscular re-education, 97535- Self Care, [id number]- Manual therapy, [phone]- Gait training, [phone]- Aquatic Therapy, [phone]- Electrical stimulation (unattended), 97016- Vasopneumatic device, [id number]- Ionotophoresis 4mg /ml Dexamethasone , 79439 (1-2 muscles), [id number] (3+ muscles)- Dry Needling, Patient/Family education, Balance training, Stair training, Taping, Joint mobilization, DME instructions, and Cryotherapy  PLAN FOR NEXT SESSION: review and expand upon HEP for core/lumbopelvic and LE strengthening; provide education in fall prevention strategies; initiate balance training to decrease fall risk   Maloree Uplinger Jerrye, Student-PT 11/07/2023, 5:16 PM

## 2023-11-09 ENCOUNTER — Ambulatory Visit

## 2023-11-09 DIAGNOSIS — M6281 Muscle weakness (generalized): Secondary | ICD-10-CM

## 2023-11-09 DIAGNOSIS — R296 Repeated falls: Secondary | ICD-10-CM

## 2023-11-09 DIAGNOSIS — R262 Difficulty in walking, not elsewhere classified: Secondary | ICD-10-CM

## 2023-11-09 DIAGNOSIS — R2681 Unsteadiness on feet: Secondary | ICD-10-CM

## 2023-11-09 NOTE — Therapy (Signed)
 OUTPATIENT PHYSICAL THERAPY TREATMENT   Patient Name: [name] MRN: [id number] DOB:[birth year], 63 y.o., female Today's Date: 11/09/2023   END OF SESSION:  PT End of Session - 11/09/23 0901     Visit Number 6    Date for PT Re-Evaluation 01/16/24    Authorization Type [employer]    Authorization Time Period VL: 60    PT Start Time 0859   pt late   PT Stop Time 0930    PT Time Calculation (min) 31 min    Activity Tolerance Patient tolerated treatment well    Behavior During Therapy WFL for tasks assessed/performed              Past Medical History:  Diagnosis Date   Abdominal pain, other specified site    Acute upper respiratory infections of unspecified site    Allergy    seasonal   Anemia    Benign neoplasm of skin, site unspecified    Disturbance of skin sensation    Diverticulosis of colon (without mention of hemorrhage)    Family history of diabetes mellitus    Family history of ischemic heart disease    Family history of malignant neoplasm of genital organ, other    GERD (gastroesophageal reflux disease)    Hypertension    Hypokalemia    Hypothyroidism    Left shoulder pain    Low blood potassium    Post-menopausal    Rheumatoid arthritis (HCC)    Sleep apnea    wears cpap    Sprain and strain of unspecified site of knee and leg    Sprain of neck    Thyroid  disease    Unspecified essential hypertension    Past Surgical History:  Procedure Laterality Date   ABDOMINAL HYSTERECTOMY     BREAST BIOPSY Right 10/30/2021   BREAST LUMPECTOMY WITH RADIOACTIVE SEED LOCALIZATION Right 12/30/2021   Procedure: RIGHT BREAST LUMPECTOMY WITH RADIOACTIVE SEED LOCALIZATION;  Surgeon: Vanderbilt Ned, MD;  [facility];  Service: General;  Laterality: Right;   BUNIONECTOMY  2005   right foot   CHOLECYSTECTOMY N/A 08/11/2023   Procedure: LAPAROSCOPIC CHOLECYSTECTOMY;  Surgeon: Vanderbilt Ned, MD;  Location: [facility];  Service: General;  Laterality:  N/A;  WITH ICG DYE   COLONOSCOPY  6-12/2008   SHOULDER SURGERY     left;spurs 2-09- dr cayetano   TOTAL ABDOMINAL HYSTERECTOMY W/ BILATERAL SALPINGOOPHORECTOMY  2002   Patient Active Problem List   Diagnosis Date Noted   Diarrhea of presumed infectious origin 04/15/2023   Preventative health care 06/09/2021   Peripheral edema 02/27/2020   OSA on CPAP 09/19/2019   Acute non-recurrent pansinusitis 04/26/2019   Counseled about COVID-19 virus infection 04/26/2019   Insulin  resistance 03/07/2019   Vitamin D  deficiency 03/07/2019   Diverticulosis 03/07/2019   Obesity with alveolar hypoventilation and body mass 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 (HCC) 03/01/2019   Excessive daytime sleepiness 03/01/2019   Class 3 severe obesity with serious comorbidity and body mass index (BMI) of 45.0 to 49.9 in adult 03/01/2019   Gastroesophageal reflux disease without esophagitis 03/01/2019   Hypothyroidism 06/15/2018   SLE (systemic lupus erythematosus related syndrome) (HCC), followed by Rheumatology, Rx Plaquenil  10/13/2017   Hyperlipidemia 01/20/2012   Osteoarthritis 04/23/2009   Pain of upper abdomen 09/13/2008   Essential hypertension 05/18/2006    PCP: Antonio Cyndee Jamee JONELLE, DO   REFERRING PROVIDER: Antonio Cyndee Jamee JONELLE, DO  REFERRING DIAG: R29.6 (ICD-10-CM) - Falls frequently   THERAPY  DIAG:  Repeated falls  Unsteadiness on feet  Muscle weakness (generalized)  Difficulty in walking, not elsewhere classified  RATIONALE FOR EVALUATION AND TREATMENT: Rehabilitation  ONSET DATE: 08/17/2023  NEXT MD VISIT: 04/30/2024   SUBJECTIVE:                                                                                                                                                                                                         SUBJECTIVE STATEMENT: Pt reports she arrived late today d/t having to help her mom out of bed.  EVAL:  Pt reports she has felt like she has been walking leaning to 1  side or off-balance for a while now.  On 08/17/23, 6 days after her gall bladder surgery on 08/11/23 she fell while going to her mailbox. Then after returning to work on 09/06/23, she fell again.  She cannot recall any preceding events leading to the fall (no tripping or lightheadedness).  Due to the falls she has lost her confidence with walking and now avoids walking for exercise like she used to due to fear of possibility of falling again.  PAIN: Are you having pain? Yes: NPRS scale: 0/10  Pain location: R knee  Pain description: punching ache when she walks  Aggravating factors: walking  Relieving factors: moving knee while sitting, Aleve    PERTINENT HISTORY:  Anemia, HTN, thyroid  disease/hypothyroidism, RA, OA - advanced R knee bone-on-bone, severe obesity, SLE, GERD, R breast lumpectomy, cholecystectomy 08/11/23   PRECAUTIONS: Fall  RED FLAGS: None  WEIGHT BEARING RESTRICTIONS: No  FALLS:  Has patient fallen in last 6 months? Yes. 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   OBJECTIVE: (objective measures completed at initial evaluation unless otherwise dated)  DIAGNOSTIC FINDINGS:  08/17/23 - EXAM: RIGHT RIBS - 2 VIEW CLINICAL DATA:  Right rib pain after fall yesterday. FINDINGS:  No fracture or other bone lesions are seen involving the ribs. IMPRESSION: Negative.  COGNITION: Overall cognitive status: Within functional limits for tasks assessed   SENSATION: WFL  COORDINATION: Grossly WFL but heel to shin limited by limited hip  ROM  POSTURE:  Wide BOS  MUSCLE LENGTH: Hamstrings: mild tight B ITB: mild tight B Piriformis: mild tight B Hip flexors: mod tight R>L Quads: mod tight R>L Heelcord:   LOWER EXTREMITY MMT:    MMT Right eval Left eval  Hip flexion 4 4  Hip extension 3+ 4-  Hip abduction 3+ 3+  Hip adduction 3+ 3+  Hip internal rotation 4 4+  Hip external rotation 4- 4-  Knee flexion 4- 4  Knee extension 4 4+  Ankle dorsiflexion 4+ 4+  Ankle plantarflexion    Ankle inversion    Ankle eversion    (Blank rows = not tested)  BED MOBILITY:  Independent  TRANSFERS: Assistive device utilized: None  Sit to stand: Complete Independence and Modified independence - patient noted to demonstrate increased genu valgum with thighs pressing against each other, more pronounced with fatigue Stand to sit: Complete Independence Chair to chair: NT Floor: NT  GAIT: Distance walked: clinic distances Assistive device utilized: None Level of assistance: Complete Independence Gait pattern: decreased stride length, lateral hip instability, lateral lean- Right, lateral lean- Left, and wide BOS Comments: increased lateral sway   STAIRS:  Level of Assistance: SBA  Stair Negotiation Technique:Forwards Alternating Pattern on ascent, Step to Pattern on descent with Single Rail on Left  Number of Stairs: 14   Height of Stairs: 7 Comments: step-to pattern on descent leading with R LE d/t bone on bone OA in R knee  FUNCTIONAL TESTS:  5 times sit to stand: 24.78 sec, >15 sec indicates risk for recurrent falls Timed up and go (TUG): 15.00 sec, >13.5 sec indicates increased risk for falls 10 meter walk test: 10.06 sec Gait speed: 3.26 ft/sec Berg Balance Scale: 49/56, 46-51 moderate (>50%) risk for falls (10/26/23) Functional gait assessment: 14/30, < 19 = high risk fall    PATIENT SURVEYS:  ABC scale: 890 / 1600 = 55.6 %, indicates moderate level of physical functioning with an increased risk for recurrent  falls   TODAY'S TREATMENT:  11/09/23 THERAPEUTIC EXERCISE: To improve strength, endurance, ROM, and flexibility.  NuStep L5x6 min Leg curls 20lb 2x10 BLE  NEUROMUSCULAR RE-EDUCATION: To improve kinesthesia and proprioception.  Mini squat 2x10 at counter Single leg RDL 2 x 10 at counter BLE Standing hip extension 2 x 10 BLE 2lb Standing hip abduction 2 x 10 BLE 2lb  11/07/23 THERAPEUTIC EXERCISE: To improve strength, endurance, ROM, and flexibility.  NuStep L3x6 min  LTRs- w/ 3s holds  SLR BLE x 10 ea   NEUROMUSCULAR RE-EDUCATION: To improve coordination, kinesthesia, posture, and proprioception.  Bridges + TrA + abd- 2x10  Standing hip extension x 10 BLE Standing hip abduction 10 BLE Standing Marches w/ 3# ankle wts- 3x1'  Side Steps (hallway) w/ 3# ankle wts - 4x10 ft STS x10- pt instructed to stagger L foot slightly in front of R to offload RLE  Kinesiotape - chondromalacia patella pattern to R knee - 2 I strips medial and lateral to patella originating from tibial tuberosity and meeting at distal quads + horiz strip lateral to medial over distal patella/patellar tendon  11/04/23 NEUROMUSCULAR RE-EDUCATION: To improve coordination, kinesthesia, posture, and proprioception.  Standing hip abduction 10 BLE Standing hip extension x 10 BLE Standing HS curls x 10 BLE Standing marches x 10 BLE Bridges + TrA set x 20  THERAPEUTIC EXERCISE: To improve strength, endurance, ROM, and flexibility.  Nustep L3x67min SLR BLE x 10 Supine LTR x 10 B  11/02/2023  THERAPEUTIC EXERCISE: To improve strength and  endurance.  Demonstration, verbal and tactile cues throughout for technique.  NuStep - L3 x 6 min Bridge 10 x 3, 2 sets - cues to avoid holding her breath Hooklying TrA + hip ADD isometric 10 x 5, 2 sets Standing hip ABD 3 x 10   NEUROMUSCULAR RE-EDUCATION: To improve coordination, kinesthesia, and posture.  Seated TrA + hip flexion march 2 min x 3 sets Kinesiotape -  chondromalacia patella pattern to R knee - 2 I strips medial and lateral to patella originating from tibial tuberosity and meeting at distal quads + horiz strip lateral to medial over distal patella/patellar tendon  THERAPEUTIC ACTIVITIES: To improve functional performance.  Demonstration, verbal and tactile cues throughout for technique. Sit to stand with arms forward 2 x 10- pt initially limited by R knee pain, improved w/ Ktaping     10/26/2023  PHYSICAL PERFORMANCE TEST or MEASUREMENT: Berg Balance Test  Sit to Stand Able to stand without using hands and stabilize independently   Standing Unsupported Able to stand safely 2 minutes   Sitting with Back Unsupported but Feet Supported on Floor or Stool Able to sit safely and securely 2 minutes   Stand to Sit Sits safely with minimal use of hands   Transfers Able to transfer safely, minor use of hands   Standing Unsupported with Eyes Closed Able to stand 10 seconds safely   Standing Unsupported with Feet Together Able to place feet together independently and stand 1 minute safely   From Standing, Reach Forward with Outstretched Arm Can reach confidently >25 cm (10)   From Standing Position, Pick up Object from Floor Able to pick up shoe, needs supervision   From Standing Position, Turn to Look Behind Over each Shoulder Looks behind one side only/other side shows less weight shift   Turn 360 Degrees Able to turn 360 degrees safely but slowly   Standing Unsupported, Alternately Place Feet on Step/Stool Able to stand independently and complete 8 steps >20 seconds   Standing Unsupported, One Foot in Front Able to place foot tandem independently and hold 30 seconds   Standing on One Leg Able to lift leg independently and hold equal to or more than 3 seconds   Total Score 49   Berg comment: 46-51 moderate (>50%) risk for falls       THERAPEUTIC EXERCISE: To improve strength.  Demonstration, verbal and tactile cues throughout for  technique. Hooklying TrA isometric 10 x 5 Hooklying TrA + hp ADD isometric 10 x 5 Hooklying TrA + GTB alt bent-knee fallout 10 x 3-5   10/24/2023  SELF CARE:  Reviewed eval findings, related fall risk and role of PT in addressing identified deficits. Education provided on safety with transitional movements to reduce risk for falls related to orthostatic hypotension as pt notes she sometimes gets dizzy with positional changes.   PATIENT EDUCATION:  Education details: HEP review and HEP update  Person educated: Patient Education method: Explanation, Demonstration, Verbal cues, and MedBridge[employer] app updated Education comprehension: verbalized understanding, returned demonstration, verbal cues required, tactile cues required, and needs further education  HOME EXERCISE PROGRAM: Access Code: [email or web address] URL: [email or web address] Date: 11/09/2023 Prepared by: Trichelle Lehan  Exercises - Supine Transversus Abdominis Bracing - Hands on Stomach  - 2 x daily - 7 x weekly - 2 sets - 10 reps - 5 sec hold - Supine Hip Adduction Isometric with Ball  - 2 x daily - 7 x weekly - 2 sets - 10 reps - 5 sec hold - Hooklying  Single Leg Bent Knee Fallouts with Resistance  - 2 x daily - 7 x weekly - 2 sets - 10 reps - 3 sec hold - Bridge  - 1 x daily - 7 x weekly - 3 sets - 10 reps - 3-5 sec hold - Seated March  - 1 x daily - 7 x weekly - 3 sets - 10 reps - Sit to Stand Without Arm Support  - 1 x daily - 7 x weekly - 2 sets - 10 reps - 3 sec hold - Standing Marching  - 1 x daily - 7 x weekly - 2 sets - 10 reps - Standing Hip Abduction with Resistance at Ankles and Counter Support  - 1 x daily - 7 x weekly - 2 sets - 10 reps - Standing Hip Extension with Resistance at Ankles and Counter Support  - 1 x daily - 7 x weekly - 2 sets - 10 reps   ASSESSMENT:  CLINICAL IMPRESSION: Advanced work on hip strengthening in standing to improve muscular co-contractions and proprioception. She arrived 14 min  late today, so our time was limited. She has shown improvement in TUG score and met STG #3 today. Progressed HEP to RTB with hip abd and ext. Pt with good response to treatment and progressing well toward goals. [name] will benefit from skilled PT intervention to address deficits with strength, mobility, balance, and activity tolerance w/ dec'd amount of pain.     EVAL: [name] is a 63 y.o. female who was referred to physical therapy for evaluation and treatment for frequent falls.  She has experienced 2 falls in the past 6 months.  Patient presents with physical impairments of B LE weakness, impaired activity tolerance, impaired standing balance, impaired ambulation, and decreased safety awareness impacting safe and independent functional mobility.  Gait speed 3.26 ft/sec consistent with community ambulator (2.62 ft/sec is needed for community access), but shy of normal walking speed of 4.37 ft/sec.  Examination revealed patient is at risk for falls and functional decline as evidenced by the following objective test measures: TUG of 15.00 sec (>13.5 sec indicates increased risk for falls), 5xSTS of 24.78 sec (>15 sec indicates increased risk for falls and decreased BLE power), and FGA score of 14/30 (<19/30 indicates high risk for falls (.  ABC scale score of 55.6% indicates a moderate level of physical functioning with an increased risk for recurrent falls.  Dava will benefit from skilled PT to address above deficits to improve mobility and activity tolerance to help reach the maximal level of functional independence and mobility. Patient demonstrates understanding of this POC and is in agreement with this plan.   OBJECTIVE IMPAIRMENTS: Abnormal gait, decreased activity tolerance, decreased balance, decreased coordination, decreased knowledge of condition, difficulty walking, decreased strength, dizziness, impaired flexibility, improper body mechanics, and postural dysfunction.   ACTIVITY  LIMITATIONS: standing, squatting, and locomotion level  PARTICIPATION LIMITATIONS: community activity  PERSONAL FACTORS: Fitness, Past/current experiences, Time since onset of injury/illness/exacerbation, and 3+ comorbidities: Anemia, HTN, thyroid  disease/hypothyroidism, RA, OA, severe obesity, SLE, GERD, R breast lumpectomy, cholecystectomy 08/11/23  are also affecting patient's functional outcome.   REHAB POTENTIAL: Good  CLINICAL DECISION MAKING: Evolving/moderate complexity  EVALUATION COMPLEXITY: Moderate   GOALS: Goals reviewed with patient? Yes  SHORT TERM GOALS: Target date: 11/28/2023  Patient will be independent with initial HEP to improve outcomes and carryover.  Baseline:  10/26/23 - HEP initiated today Goal status: IN PROGRESS - 11/02/23 - HEP updated today  2.  Patient will be educated on strategies to decrease risk of falls.  Baseline:  Goal status: IN PROGRESS  3.  Patient will demonstrate decreased TUG time to </= 13.5 sec to decrease risk for falls with transitional mobility. Baseline: 15.0 sec Goal status: MET- 11/09/23- 11.64 sec  4.  Patient will improve 5xSTS time to </= 20 seconds for improved efficiency and safety with transfers. Baseline: 24.78 sec Goal status: IN PROGRESS   LONG TERM GOALS: Target date: 01/16/2024  Patient will be independent with advanced/ongoing HEP to facilitate ability to maintain/progress functional gains from skilled physical therapy services. Baseline:  Goal status: IN PROGRESS  2.  Patient will be able to ambulate 600' w/o AD on variable surfaces with good safety to access community.  Baseline:  Goal status: IN PROGRESS  3.  Patient will be able to step up/down curb safely with LRAD for safety with community ambulation.  Baseline:  Goal status: IN PROGRESS   4.  Patient will demonstrate improved B LE strength to >/= 4 to 4+/5 for improved stability and ease of mobility . Baseline: Refer to above LE MMT table Goal status:  IN PROGRESS  5.  Patient will improve 5xSTS time to </= 15 seconds for improved efficiency and safety with transfers. Baseline: 24.78 sec Goal status: IN PROGRESS   6.  Patient will improve Berg score by at least 8 points or to >/= 52/56 to improve safety and stability with ADLs in standing and reduce risk for falls. (MCID= 8 points)  Baseline: 49/56 (10/26/23) Goal status: IN PROGRESS  7.  Patient will improve FGA score to at least 19/30 to improve gait stability and reduce risk for falls. Baseline: 14/30 Goal status: IN PROGRESS  8.  Patient will report >/= 75% on ABC scale (MCID = 19%) to demonstrate improved balance confidence with functional mobility and gait. Baseline: 890 / 1600 = 55.6 % Goal status: IN PROGRESS   PLAN:  PT FREQUENCY: 2x/week  PT DURATION: 8-12 weeks  PLANNED INTERVENTIONS: 97164- PT Re-evaluation, 97750- Physical Performance Testing, 97110-Therapeutic exercises, 97530- Therapeutic activity, 97112- Neuromuscular re-education, 97535- Self Care, [id number]- Manual therapy, [id number]- Gait training, [phone]- Aquatic Therapy, [phone]- Electrical stimulation (unattended), 97016- Vasopneumatic device, [id number]- Ionotophoresis 4mg /ml Dexamethasone , 79439 (1-2 muscles), [id number] (3+ muscles)- Dry Needling, Patient/Family education, Balance training, Stair training, Taping, Joint mobilization, DME instructions, and Cryotherapy  PLAN FOR NEXT SESSION: continue progressing core/lumbopelvic and LE strengthening; provide education in fall prevention strategies; initiate balance training to decrease fall risk   Sol LITTIE Gaskins, PTA 11/09/2023, 9:32 AM

## 2023-11-14 ENCOUNTER — Ambulatory Visit: Admitting: Physical Therapy

## 2023-11-16 ENCOUNTER — Ambulatory Visit

## 2023-11-16 DIAGNOSIS — R296 Repeated falls: Secondary | ICD-10-CM

## 2023-11-16 DIAGNOSIS — R262 Difficulty in walking, not elsewhere classified: Secondary | ICD-10-CM

## 2023-11-16 DIAGNOSIS — M6281 Muscle weakness (generalized): Secondary | ICD-10-CM

## 2023-11-16 DIAGNOSIS — R2681 Unsteadiness on feet: Secondary | ICD-10-CM

## 2023-11-16 NOTE — Therapy (Signed)
 OUTPATIENT PHYSICAL THERAPY TREATMENT   Patient Name: [name] MRN: [id number] DOB:[birth year], 63 y.o., female Today's Date: 11/16/2023   END OF SESSION:  PT End of Session - 11/16/23 0907     Visit Number 7    Date for PT Re-Evaluation 01/16/24    Authorization Type [employer]    Authorization Time Period VL: 60    PT Start Time 0900   pt late   PT Stop Time 0941    PT Time Calculation (min) 41 min    Activity Tolerance Patient tolerated treatment well    Behavior During Therapy WFL for tasks assessed/performed               Past Medical History:  Diagnosis Date   Abdominal pain, other specified site    Acute upper respiratory infections of unspecified site    Allergy    seasonal   Anemia    Benign neoplasm of skin, site unspecified    Disturbance of skin sensation    Diverticulosis of colon (without mention of hemorrhage)    Family history of diabetes mellitus    Family history of ischemic heart disease    Family history of malignant neoplasm of genital organ, other    GERD (gastroesophageal reflux disease)    Hypertension    Hypokalemia    Hypothyroidism    Left shoulder pain    Low blood potassium    Post-menopausal    Rheumatoid arthritis (HCC)    Sleep apnea    wears cpap    Sprain and strain of unspecified site of knee and leg    Sprain of neck    Thyroid  disease    Unspecified essential hypertension    Past Surgical History:  Procedure Laterality Date   ABDOMINAL HYSTERECTOMY     BREAST BIOPSY Right 10/30/2021   BREAST LUMPECTOMY WITH RADIOACTIVE SEED LOCALIZATION Right 12/30/2021   Procedure: RIGHT BREAST LUMPECTOMY WITH RADIOACTIVE SEED LOCALIZATION;  Surgeon: Vanderbilt Ned, MD;  Location: [facility];  Service: General;  Laterality: Right;   BUNIONECTOMY  2005   right foot   CHOLECYSTECTOMY N/A 08/11/2023   Procedure: LAPAROSCOPIC CHOLECYSTECTOMY;  Surgeon: Vanderbilt Ned, MD;  Location: [facility];  Service: General;  Laterality:  N/A;  WITH ICG DYE   COLONOSCOPY  6-12/2008   SHOULDER SURGERY     left;spurs 2-09- dr cayetano   TOTAL ABDOMINAL HYSTERECTOMY W/ BILATERAL SALPINGOOPHORECTOMY  2002   Patient Active Problem List   Diagnosis Date Noted   Diarrhea of presumed infectious origin 04/15/2023   Preventative health care 06/09/2021   Peripheral edema 02/27/2020   OSA on CPAP 09/19/2019   Acute non-recurrent pansinusitis 04/26/2019   Counseled about COVID-19 virus infection 04/26/2019   Insulin  resistance 03/07/2019   Vitamin D  deficiency 03/07/2019   Diverticulosis 03/07/2019   Obesity with alveolar hypoventilation and body mass 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 (HCC) 03/01/2019   Excessive daytime sleepiness 03/01/2019   Class 3 severe obesity with serious comorbidity and body mass index (BMI) of 45.0 to 49.9 in adult 03/01/2019   Gastroesophageal reflux disease without esophagitis 03/01/2019   Hypothyroidism 06/15/2018   SLE (systemic lupus erythematosus related syndrome) (HCC), followed by Rheumatology, Rx Plaquenil  10/13/2017   Hyperlipidemia 01/20/2012   Osteoarthritis 04/23/2009   Pain of upper abdomen 09/13/2008   Essential hypertension 05/18/2006    PCP: Antonio Cyndee Jamee JONELLE, DO   REFERRING PROVIDER: Antonio Cyndee, Jamee JONELLE, DO  REFERRING DIAG: R29.6 (ICD-10-CM) - Falls frequently  THERAPY DIAG:  Repeated falls  Unsteadiness on feet  Muscle weakness (generalized)  Difficulty in walking, not elsewhere classified  RATIONALE FOR EVALUATION AND TREATMENT: Rehabilitation  ONSET DATE: 08/17/2023  NEXT MD VISIT: 04/30/2024   SUBJECTIVE:                                                                                                                                                                                                         SUBJECTIVE STATEMENT: Pt reports no new complaints.  EVAL:  Pt reports she has felt like she has been walking leaning to 1 side or off-balance for a while now.  On  08/17/23, 6 days after her gall bladder surgery on 08/11/23 she fell while going to her mailbox. Then after returning to work on 09/06/23, she fell again.  She cannot recall any preceding events leading to the fall (no tripping or lightheadedness).  Due to the falls she has lost her confidence with walking and now avoids walking for exercise like she used to due to fear of possibility of falling again.  PAIN: Are you having pain? Yes: NPRS scale: 0/10  Pain location: R knee  Pain description: punching ache when she walks  Aggravating factors: walking  Relieving factors: moving knee while sitting, Aleve    PERTINENT HISTORY:  Anemia, HTN, thyroid  disease/hypothyroidism, RA, OA - advanced R knee bone-on-bone, severe obesity, SLE, GERD, R breast lumpectomy, cholecystectomy 08/11/23   PRECAUTIONS: Fall  RED FLAGS: None  WEIGHT BEARING RESTRICTIONS: No  FALLS:  Has patient fallen in last 6 months? Yes. 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   OBJECTIVE: (objective measures completed at initial evaluation unless otherwise dated)  DIAGNOSTIC FINDINGS:  08/17/23 - EXAM: RIGHT RIBS - 2 VIEW CLINICAL DATA:  Right rib pain after fall yesterday. FINDINGS:  No fracture or other bone lesions are seen involving the ribs. IMPRESSION: Negative.  COGNITION: Overall cognitive status: Within functional limits for tasks assessed   SENSATION: WFL  COORDINATION: Grossly WFL but heel to shin limited by limited hip ROM  POSTURE:  Wide BOS  MUSCLE  LENGTH: Hamstrings: mild tight B  ITB: mild tight B Piriformis: mild tight B Hip flexors: mod tight R>L Quads: mod tight R>L Heelcord:   LOWER EXTREMITY MMT:    MMT Right eval Left eval  Hip flexion 4 4  Hip extension 3+ 4-  Hip abduction 3+ 3+  Hip adduction 3+ 3+  Hip internal rotation 4 4+  Hip external rotation 4- 4-  Knee flexion 4- 4  Knee extension 4 4+  Ankle dorsiflexion 4+ 4+  Ankle plantarflexion    Ankle inversion    Ankle eversion    (Blank rows = not tested)  BED MOBILITY:  Independent  TRANSFERS: Assistive device utilized: None  Sit to stand: Complete Independence and Modified independence - patient noted to demonstrate increased genu valgum with thighs pressing against each other, more pronounced with fatigue Stand to sit: Complete Independence Chair to chair: NT Floor: NT  GAIT: Distance walked: clinic distances Assistive device utilized: None Level of assistance: Complete Independence Gait pattern: decreased stride length, lateral hip instability, lateral lean- Right, lateral lean- Left, and wide BOS Comments: increased lateral sway   STAIRS:  Level of Assistance: SBA  Stair Negotiation Technique:Forwards Alternating Pattern on ascent, Step to Pattern on descent with Single Rail on Left  Number of Stairs: 14   Height of Stairs: 7 Comments: step-to pattern on descent leading with R LE d/t bone on bone OA in R knee  FUNCTIONAL TESTS:  5 times sit to stand: 24.78 sec, >15 sec indicates risk for recurrent falls Timed up and go (TUG): 15.00 sec, >13.5 sec indicates increased risk for falls 10 meter walk test: 10.06 sec Gait speed: 3.26 ft/sec Berg Balance Scale: 49/56, 46-51 moderate (>50%) risk for falls (10/26/23) Functional gait assessment: 14/30, < 19 = high risk fall    PATIENT SURVEYS:  ABC scale: 890 / 1600 = 55.6 %, indicates moderate level of physical functioning with an increased risk for recurrent falls   TODAY'S TREATMENT:   11/16/23 THERAPEUTIC EXERCISE: To improve strength, endurance, ROM, and flexibility.  NuStep L5x6 min Assess 5xSTS see STG#4 Leg curls 25lb 2x10 BLE Leg ext 10lb x 20 BLE  THERAPEUTIC ACTIVITIES: To improve functional performance.  Single leg RDL 2x10 at counter BLE Sidesteps RTB at ankles 4x20 ft Monster walks RTB at ankes 4x79ft Lateral step ups 4' RLE x 20 with UE support Fwd step ups 4' RLE x 20 UE support  11/09/23 THERAPEUTIC EXERCISE: To improve strength, endurance, ROM, and flexibility.  NuStep L5x6 min Leg curls 20lb 2x10 BLE  NEUROMUSCULAR RE-EDUCATION: To improve kinesthesia and proprioception.  Mini squat 2x10 at counter Single leg RDL 2 x 10 at counter BLE Standing hip extension 2 x 10 BLE 2lb Standing hip abduction 2 x 10 BLE 2lb  11/07/23 THERAPEUTIC EXERCISE: To improve strength, endurance, ROM, and flexibility.  NuStep L3x6 min  LTRs- w/ 3s holds  SLR BLE x 10 ea   NEUROMUSCULAR RE-EDUCATION: To improve coordination, kinesthesia, posture, and proprioception.  Bridges + TrA + abd- 2x10  Standing hip extension x 10 BLE Standing hip abduction 10 BLE Standing Marches w/ 3# ankle wts- 3x1'  Side Steps (hallway) w/ 3# ankle wts - 4x10 ft STS x10- pt instructed to stagger L foot slightly in front of R to offload RLE  Kinesiotape - chondromalacia patella pattern to R knee - 2 I strips medial and lateral to patella originating from tibial tuberosity and meeting at distal quads + horiz strip lateral to medial over distal patella/patellar tendon  PATIENT EDUCATION:  Education details: HEP review and HEP update  Person educated: Patient Education method: Explanation, Demonstration, Verbal cues, and MedBridgeGO app updated Education comprehension: verbalized understanding, returned demonstration, verbal cues required, tactile cues required, and needs further education  HOME EXERCISE PROGRAM: Access Code: [email or web address] URL:  [email or web address] Date: 11/16/2023 Prepared by: Sol Gaskins  Exercises - Supine Transversus Abdominis Bracing - Hands on Stomach  - 2 x daily - 7 x weekly - 2 sets - 10 reps - 5 sec hold - Supine Hip Adduction Isometric with Ball  - 2 x daily - 7 x weekly - 2 sets - 10 reps - 5 sec hold - Hooklying Single Leg Bent Knee Fallouts with Resistance  - 2 x daily - 7 x weekly - 2 sets - 10 reps - 3 sec hold - Bridge  - 1 x daily - 7 x weekly - 3 sets - 10 reps - 3-5 sec hold - Seated March  - 1 x daily - 7 x weekly - 3 sets - 10 reps - Sit to Stand Without Arm Support  - 1 x daily - 7 x weekly - 2 sets - 10 reps - 3 sec hold - Standing Marching  - 1 x daily - 7 x weekly - 2 sets - 10 reps - Standing Hip Abduction with Resistance at Ankles and Counter Support  - 1 x daily - 7 x weekly - 2 sets - 10 reps - Standing Hip Extension with Resistance at Ankles and Counter Support  - 1 x daily - 7 x weekly - 2 sets - 10 reps - Band Walks  - 1 x daily - 7 x weekly - 2 sets - 10 reps   ASSESSMENT:  CLINICAL IMPRESSION: Pt responded well to treatment. Progressed dynamic strengthening to improve gait and balance combined with hip strengthening. Introduced step ups as well with good response. Pt is progressing towards STG #4, improving TUG score by 4 points.  Anaisha will benefit from skilled PT intervention to address deficits with strength, mobility, balance, and activity tolerance w/ dec'd amount of pain.     EVAL: DONNALYNN WHEELESS is a 63 y.o. female who was referred to physical therapy for evaluation and treatment for frequent falls.  She has experienced 2 falls in the past 6 months.  Patient presents with physical impairments of B LE weakness, impaired activity tolerance, impaired standing balance, impaired ambulation, and decreased safety awareness impacting safe and independent functional mobility.  Gait speed 3.26 ft/sec consistent with community ambulator (2.62 ft/sec is needed for  community access), but shy of normal walking speed of 4.37 ft/sec.  Examination revealed patient is at risk for falls and functional decline as evidenced by the following objective test measures: TUG of 15.00 sec (>13.5 sec indicates increased risk for falls), 5xSTS of 24.78 sec (>15 sec indicates increased risk for falls and decreased BLE power), and FGA score of 14/30 (<19/30 indicates high risk for falls (.  ABC scale score of 55.6% indicates a moderate level of physical functioning with an increased risk for recurrent falls.  Mykiah will benefit from skilled PT to address above deficits to improve mobility and activity tolerance to help reach the maximal level of functional independence and mobility. Patient demonstrates understanding of this POC and is in agreement with this plan.   OBJECTIVE IMPAIRMENTS: Abnormal gait, decreased activity tolerance, decreased balance, decreased coordination, decreased knowledge of condition, difficulty walking, decreased strength, dizziness, impaired flexibility, improper body mechanics, and postural dysfunction.  ACTIVITY LIMITATIONS: standing, squatting, and locomotion level  PARTICIPATION LIMITATIONS: community activity  PERSONAL FACTORS: Fitness, Past/current experiences, Time since onset of injury/illness/exacerbation, and 3+ comorbidities: Anemia, HTN, thyroid  disease/hypothyroidism, RA, OA, severe obesity, SLE, GERD, R breast lumpectomy, cholecystectomy 08/11/23  are also affecting patient's functional outcome.   REHAB POTENTIAL: Good  CLINICAL DECISION MAKING: Evolving/moderate complexity  EVALUATION COMPLEXITY: Moderate   GOALS: Goals reviewed with patient? Yes  SHORT TERM GOALS: Target date: 11/28/2023  Patient will be independent with initial HEP to improve outcomes and carryover.  Baseline:  10/26/23 - HEP initiated today Goal status: IN PROGRESS - 11/02/23 - HEP updated today  2.  Patient will be educated on strategies to decrease risk of  falls.  Baseline:  Goal status: IN PROGRESS  3.  Patient will demonstrate decreased TUG time to </= 13.5 sec to decrease risk for falls with transitional mobility. Baseline: 15.0 sec Goal status: MET- 11/09/23- 11.64 sec  4.  Patient will improve 5xSTS time to </= 20 seconds for improved efficiency and safety with transfers. Baseline: 24.78 sec Goal status: PARTIALLY MET - 11/16/23 20 sec no UE support  LONG TERM GOALS: Target date: 01/16/2024  Patient will be independent with advanced/ongoing HEP to facilitate ability to maintain/progress functional gains from skilled physical therapy services. Baseline:  Goal status: IN PROGRESS  2.  Patient will be able to ambulate 600' w/o AD on variable surfaces with good safety to access community.  Baseline:  Goal status: IN PROGRESS  3.  Patient will be able to step up/down curb safely with LRAD for safety with community ambulation.  Baseline:  Goal status: IN PROGRESS   4.  Patient will demonstrate improved B LE strength to >/= 4 to 4+/5 for improved stability and ease of mobility . Baseline: Refer to above LE MMT table Goal status: IN PROGRESS  5.  Patient will improve 5xSTS time to </= 15 seconds for improved efficiency and safety with transfers. Baseline: 24.78 sec Goal status: IN PROGRESS   6.  Patient will improve Berg score by at least 8 points or to >/= 52/56 to improve safety and stability with ADLs in standing and reduce risk for falls. (MCID= 8 points)  Baseline: 49/56 (10/26/23) Goal status: IN PROGRESS  7.  Patient will improve FGA score to at least 19/30 to improve gait stability and reduce risk for falls. Baseline: 14/30 Goal status: IN PROGRESS  8.  Patient will report >/= 75% on ABC scale (MCID = 19%) to demonstrate improved balance confidence with functional mobility and gait. Baseline: 890 / 1600 = 55.6 % Goal status: IN PROGRESS   PLAN:  PT FREQUENCY: 2x/week  PT DURATION: 8-12 weeks  PLANNED INTERVENTIONS:  97164- PT Re-evaluation, 97750- Physical Performance Testing, 97110-Therapeutic exercises, 97530- Therapeutic activity, 97112- Neuromuscular re-education, 97535- Self Care, [id number]- Manual therapy, [phone]- Gait training, [phone]- Aquatic Therapy, [phone]- Electrical stimulation (unattended), 97016- Vasopneumatic device, [id number]- Ionotophoresis 4mg /ml Dexamethasone , 79439 (1-2 muscles), [id number] (3+ muscles)- Dry Needling, Patient/Family education, Balance training, Stair training, Taping, Joint mobilization, DME instructions, and Cryotherapy  PLAN FOR NEXT SESSION: continue progressing core/lumbopelvic and LE strengthening; provide education in fall prevention strategies; initiate balance training to decrease fall risk   Traeson Dusza L Wen [place], PTA 11/16/2023, 9:45 AM

## 2023-11-17 ENCOUNTER — Other Ambulatory Visit: Payer: Self-pay | Admitting: Obstetrics and Gynecology

## 2023-11-17 DIAGNOSIS — R928 Other abnormal and inconclusive findings on diagnostic imaging of breast: Secondary | ICD-10-CM

## 2023-11-22 ENCOUNTER — Other Ambulatory Visit: Payer: Self-pay | Admitting: Obstetrics and Gynecology

## 2023-11-22 ENCOUNTER — Ambulatory Visit
Admission: RE | Admit: 2023-11-22 | Discharge: 2023-11-22 | Disposition: A | Discharge status: Home / Self Care | Source: Ambulatory Visit | Attending: Obstetrics and Gynecology | Admitting: Obstetrics and Gynecology

## 2023-11-22 DIAGNOSIS — R928 Other abnormal and inconclusive findings on diagnostic imaging of breast: Secondary | ICD-10-CM

## 2023-11-22 DIAGNOSIS — R921 Mammographic calcification found on diagnostic imaging of breast: Secondary | ICD-10-CM

## 2023-11-28 ENCOUNTER — Ambulatory Visit
Admission: RE | Admit: 2023-11-28 | Discharge: 2023-11-28 | Disposition: A | Discharge status: Home / Self Care | Source: Ambulatory Visit | Attending: Obstetrics and Gynecology

## 2023-11-28 ENCOUNTER — Ambulatory Visit
Admission: RE | Admit: 2023-11-28 | Discharge: 2023-11-28 | Disposition: A | Discharge status: Home / Self Care | Source: Ambulatory Visit | Attending: Obstetrics and Gynecology | Admitting: Obstetrics and Gynecology

## 2023-11-28 DIAGNOSIS — R928 Other abnormal and inconclusive findings on diagnostic imaging of breast: Secondary | ICD-10-CM

## 2023-11-28 DIAGNOSIS — R921 Mammographic calcification found on diagnostic imaging of breast: Secondary | ICD-10-CM

## 2023-11-28 HISTORY — PX: BREAST BIOPSY: SHX20

## 2023-11-29 LAB — SURGICAL PATHOLOGY

## 2023-11-30 ENCOUNTER — Ambulatory Visit: Attending: Family Medicine | Admitting: Physical Therapy

## 2023-11-30 DIAGNOSIS — R296 Repeated falls: Secondary | ICD-10-CM | POA: Diagnosis present

## 2023-11-30 DIAGNOSIS — M6281 Muscle weakness (generalized): Secondary | ICD-10-CM | POA: Insufficient documentation

## 2023-11-30 DIAGNOSIS — R2681 Unsteadiness on feet: Secondary | ICD-10-CM | POA: Insufficient documentation

## 2023-11-30 DIAGNOSIS — R262 Difficulty in walking, not elsewhere classified: Secondary | ICD-10-CM | POA: Diagnosis present

## 2023-11-30 NOTE — Therapy (Signed)
 OUTPATIENT PHYSICAL THERAPY TREATMENT   Patient Name: [name] MRN: [id number] DOB:1960/10/22, 63 y.o., female Today's Date: 11/30/2023   END OF SESSION:  PT End of Session - 11/30/23 0857     Visit Number 8    Date for PT Re-Evaluation 01/16/24    Authorization Type [employer]    Authorization Time Period VL: 60    PT Start Time 0857   Pt arrived late   PT Stop Time 0936    PT Time Calculation (min) 39 min    Activity Tolerance Patient tolerated treatment well    Behavior During Therapy WFL for tasks assessed/performed               Past Medical History:  Diagnosis Date   Abdominal pain, other specified site    Acute upper respiratory infections of unspecified site    Allergy    seasonal   Anemia    Benign neoplasm of skin, site unspecified    Disturbance of skin sensation    Diverticulosis of colon (without mention of hemorrhage)    Family history of diabetes mellitus    Family history of ischemic heart disease    Family history of malignant neoplasm of genital organ, other    GERD (gastroesophageal reflux disease)    Hypertension    Hypokalemia    Hypothyroidism    Left shoulder pain    Low blood potassium    Post-menopausal    Rheumatoid arthritis (HCC)    Sleep apnea    wears cpap    Sprain and strain of unspecified site of knee and leg    Sprain of neck    Thyroid  disease    Unspecified essential hypertension    Past Surgical History:  Procedure Laterality Date   ABDOMINAL HYSTERECTOMY     BREAST BIOPSY Right 10/30/2021   BREAST BIOPSY Left 11/28/2023   MM LT BREAST BX W LOC DEV 1ST LESION IMAGE BX SPEC STEREO GUIDE 11/28/2023 [facility]   BREAST LUMPECTOMY WITH RADIOACTIVE SEED LOCALIZATION Right 12/30/2021   Procedure: RIGHT BREAST LUMPECTOMY WITH RADIOACTIVE SEED LOCALIZATION;  Surgeon: Vanderbilt Ned, MD;  Location: [facility];  Service: General;  Laterality: Right;   BUNIONECTOMY  2005   right foot   CHOLECYSTECTOMY N/A  08/11/2023   Procedure: LAPAROSCOPIC CHOLECYSTECTOMY;  Surgeon: Vanderbilt Ned, MD;  Location: [facility];  Service: General;  Laterality: N/A;  WITH ICG DYE   COLONOSCOPY  6-12/2008   SHOULDER SURGERY     left;spurs 2-09- dr cayetano   TOTAL ABDOMINAL HYSTERECTOMY W/ BILATERAL SALPINGOOPHORECTOMY  2002   Patient Active Problem List   Diagnosis Date Noted   Diarrhea of presumed infectious origin 04/15/2023   Preventative health care 06/09/2021   Peripheral edema 02/27/2020   OSA on CPAP 09/19/2019   Acute non-recurrent pansinusitis 04/26/2019   Counseled about COVID-19 virus infection 04/26/2019   Insulin  resistance 03/07/2019   Vitamin D  deficiency 03/07/2019   Diverticulosis 03/07/2019   Obesity with alveolar hypoventilation and body mass 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 (HCC) 03/01/2019   Excessive daytime sleepiness 03/01/2019   Class 3 severe obesity with serious comorbidity and body mass index (BMI) of 45.0 to 49.9 in adult 03/01/2019   Gastroesophageal reflux disease without esophagitis 03/01/2019   Hypothyroidism 06/15/2018   SLE (systemic lupus erythematosus related syndrome) (HCC), followed by Rheumatology, Rx Plaquenil  10/13/2017   Hyperlipidemia 01/20/2012   Osteoarthritis 04/23/2009   Pain of upper abdomen 09/13/2008   Essential hypertension 05/18/2006  PCP: Antonio Meth, Jamee SAUNDERS, DO   REFERRING PROVIDER: Antonio Meth Jamee SAUNDERS, DO  REFERRING DIAG: R29.6 (ICD-10-CM) - Falls frequently   THERAPY DIAG:  Repeated falls  Unsteadiness on feet  Muscle weakness (generalized)  Difficulty in walking, not elsewhere classified  RATIONALE FOR EVALUATION AND TREATMENT: Rehabilitation  ONSET DATE: 08/17/2023  NEXT MD VISIT: 04/30/2024   SUBJECTIVE:                                                                                                                                                                                                         SUBJECTIVE STATEMENT: Pt reports  no new complaints with pain. She is still doing most exercises at work except bridges. Feeling pretty tired today.   EVAL:  Pt reports she has felt like she has been walking leaning to 1 side or off-balance for a while now.  On 08/17/23, 6 days after her gall bladder surgery on 08/11/23 she fell while going to her mailbox. Then after returning to work on 09/06/23, she fell again.  She cannot recall any preceding events leading to the fall (no tripping or lightheadedness).  Due to the falls she has lost her confidence with walking and now avoids walking for exercise like she used to due to fear of possibility of falling again.  PAIN: Are you having pain? Yes: NPRS scale: 0/10  Pain location: R knee  Pain description: punching ache when she walks  Aggravating factors: walking  Relieving factors: moving knee while sitting, Aleve    PERTINENT HISTORY:  Anemia, HTN, thyroid  disease/hypothyroidism, RA, OA - advanced R knee bone-on-bone, severe obesity, SLE, GERD, R breast lumpectomy, cholecystectomy 08/11/23   PRECAUTIONS: Fall  RED FLAGS: None  WEIGHT BEARING RESTRICTIONS: No  FALLS:  Has patient fallen in last 6 months? Yes. 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   OBJECTIVE: (objective measures completed at initial evaluation unless otherwise dated)  DIAGNOSTIC FINDINGS:  08/17/23 - EXAM: RIGHT RIBS - 2 VIEW CLINICAL DATA:  Right rib pain after fall yesterday. FINDINGS:  No fracture or other bone lesions are seen involving the ribs. IMPRESSION:  Negative.  COGNITION: Overall cognitive status: Within functional limits for tasks assessed   SENSATION: WFL  COORDINATION: Grossly WFL but heel to shin limited by limited hip ROM  POSTURE:  Wide BOS  MUSCLE LENGTH: Hamstrings: mild tight B ITB: mild tight B Piriformis: mild tight B Hip flexors: mod tight R>L Quads: mod tight R>L Heelcord:   LOWER EXTREMITY MMT:    MMT Right eval Left eval R 11/30/23 L 11/30/23  Hip flexion 4 4 4+ 4  Hip extension 3+ 4- 4- 4-  Hip abduction 3+ 3+ 4- 4-  Hip adduction 3+ 3+ 4+ 4+  Hip internal rotation 4 4+ 4+ 4+  Hip external rotation 4- 4- 4+ 4-  Knee flexion 4- 4 4 4+  Knee extension 4 4+ 5 5  Ankle dorsiflexion 4+ 4+ 4+ 4+  Ankle plantarflexion      Ankle inversion      Ankle eversion      (Blank rows = not tested)  BED MOBILITY:  Independent  TRANSFERS: Assistive device utilized: None  Sit to stand: Complete Independence and Modified independence - patient noted to demonstrate increased genu valgum with thighs pressing against each other, more pronounced with fatigue Stand to sit: Complete Independence Chair to chair: NT Floor: NT  GAIT: Distance walked: clinic distances Assistive device utilized: None Level of assistance: Complete Independence Gait pattern: decreased stride length, lateral hip instability, lateral lean- Right, lateral lean- Left, and wide BOS Comments: increased lateral sway   STAIRS:  Level of Assistance: SBA  Stair Negotiation Technique:Forwards Alternating Pattern on ascent, Step to Pattern on descent with Single Rail on Left  Number of Stairs: 14   Height of Stairs: 7 Comments: step-to pattern on descent leading with R LE d/t bone on bone OA in R knee  FUNCTIONAL TESTS:  5 times sit to stand: 24.78 sec, >15 sec indicates risk for recurrent falls Timed up and go (TUG): 15.00 sec, >13.5 sec indicates increased risk for falls 10 meter walk test: 10.06 sec Gait speed: 3.26 ft/sec Berg Balance  Scale: 49/56, 46-51 moderate (>50%) risk for falls (10/26/23) Functional gait assessment: 14/30, < 19 = high risk fall    PATIENT SURVEYS:  ABC scale: 890 / 1600 = 55.6 %, indicates moderate level of physical functioning with an increased risk for recurrent falls   TODAY'S TREATMENT:   11/30/23 THERAPEUTIC EXERCISE: To improve strength, endurance, ROM, and flexibility.  NuStep L5x17min  Seated Marches, 2x30   THERAPEUTIC ACTIVITIES: To improve functional performance.  5xSTS  LE MMT Fwd Step ups w/ High Knee March, x10 B w/ 3# 4- pt required CGA for both, needed to hold on ladder w/ leading with RLE  Lateral Stepping onto 4 2x10 B   NEUROMUSCULAR RE-EDUCATION: To improve kinesthesia and proprioception.  Standing hip abd, x15 B 3#  Standing hip ext x15 , B 3#  Standing hip add x10, B 3#   11/16/23 THERAPEUTIC EXERCISE: To improve strength, endurance, ROM, and flexibility.  NuStep L5x6 min Assess 5xSTS see STG#4 Leg curls 25lb 2x10 BLE Leg ext 10lb x 20 BLE  THERAPEUTIC ACTIVITIES: To improve functional performance.  Single leg RDL 2x10 at counter BLE Sidesteps RTB at ankles 4x20 ft Monster walks RTB at ankes 4x67ft Lateral step ups 4' RLE x 20 with UE support Fwd step ups 4' RLE x 20 UE support   11/09/23 THERAPEUTIC EXERCISE: To improve strength, endurance, ROM, and flexibility.  NuStep L5x6 min Leg curls 20lb 2x10 BLE  NEUROMUSCULAR RE-EDUCATION: To  improve kinesthesia and proprioception.  Mini squat 2x10 at counter Single leg RDL 2 x 10 at counter BLE Standing hip extension 2 x 10 BLE 2lb Standing hip abduction 2 x 10 BLE 2lb   11/07/23 THERAPEUTIC EXERCISE: To improve strength, endurance, ROM, and flexibility.  NuStep L3x6 min  LTRs- w/ 3s holds  SLR BLE x 10 ea   NEUROMUSCULAR RE-EDUCATION: To improve coordination, kinesthesia, posture, and proprioception.  Bridges + TrA + abd- 2x10  Standing hip extension x 10 BLE Standing hip abduction 10  BLE Standing Marches w/ 3# ankle wts- 3x1'  Side Steps (hallway) w/ 3# ankle wts - 4x10 ft STS x10- pt instructed to stagger L foot slightly in front of R to offload RLE  Kinesiotape - chondromalacia patella pattern to R knee - 2 I strips medial and lateral to patella originating from tibial tuberosity and meeting at distal quads + horiz strip lateral to medial over distal patella/patellar tendon   PATIENT EDUCATION:  Education details: HEP review and HEP update  Person educated: Patient Education method: Explanation, Demonstration, Verbal cues, and MedBridge[employer] app updated Education comprehension: verbalized understanding, returned demonstration, verbal cues required, tactile cues required, and needs further education  HOME EXERCISE PROGRAM: Access Code: [email or web address] URL: [email or web address] Date: 11/30/2023 Prepared by: Eusebio Saba  Exercises - Supine Transversus Abdominis Bracing - Hands on Stomach  - 2 x daily - 7 x weekly - 2 sets - 10 reps - 5 sec hold - Supine Hip Adduction Isometric with Ball  - 2 x daily - 7 x weekly - 2 sets - 10 reps - 5 sec hold - Hooklying Single Leg Bent Knee Fallouts with Resistance  - 2 x daily - 7 x weekly - 2 sets - 10 reps - 3 sec hold - Seated March  - 1 x daily - 7 x weekly - 3 sets - 10 reps - Sit to Stand Without Arm Support  - 1 x daily - 7 x weekly - 2 sets - 10 reps - 3 sec hold - Standing Marching  - 1 x daily - 7 x weekly - 2 sets - 10 reps - Standing Hip Abduction with Resistance at Ankles and Counter Support  - 1 x daily - 7 x weekly - 2 sets - 10 reps - Standing Hip Extension with Resistance at Ankles and Counter Support  - 1 x daily - 7 x weekly - 2 sets - 10 reps - Band Walks  - 1 x daily - 7 x weekly - 2 sets - 10 reps   ASSESSMENT:  CLINICAL IMPRESSION: Pierrette reports that she is still doing well, no new reports of pain or any falls today. She has been able to do her HEP with the exception of glute bridges as she  cannot do those at work when she does her other exercises. Since pt is doing standing hip ext w/ resistance, removed glute bridges from her HEP and suggested continuing with standing hip ext to target those muscles. Patient performed a 5xSTS today twice, the first trial was performed in 23s with good control of her descent from standing. Her 2nd trial was done in 19.2s which is less than 20s but, there was less control of her descent with faster movement noted when assessing STG #4. Pt made improvement with LE strength shown in MMT chart above for LTG #4. Pt was able to perform all activities today focused of continued LE strength w/ ankle weights today. Performed step ups with weights  and contralateral high knee marches, noted inc'd difficulty w/ RLE as her leading leg in comparison to her L side. Pt is progressing well with therapy and will continue to benefit from skilled PT to address remaining deficits to reduce fall risk and improve her mobility, balance, and activity tolerance w/ dec'd pain interference.    EVAL: KERY HALTIWANGER is a 63 y.o. female who was referred to physical therapy for evaluation and treatment for frequent falls.  She has experienced 2 falls in the past 6 months.  Patient presents with physical impairments of B LE weakness, impaired activity tolerance, impaired standing balance, impaired ambulation, and decreased safety awareness impacting safe and independent functional mobility.  Gait speed 3.26 ft/sec consistent with community ambulator (2.62 ft/sec is needed for community access), but shy of normal walking speed of 4.37 ft/sec.  Examination revealed patient is at risk for falls and functional decline as evidenced by the following objective test measures: TUG of 15.00 sec (>13.5 sec indicates increased risk for falls), 5xSTS of 24.78 sec (>15 sec indicates increased risk for falls and decreased BLE power), and FGA score of 14/30 (<19/30 indicates high risk for falls (.  ABC scale score  of 55.6% indicates a moderate level of physical functioning with an increased risk for recurrent falls.  Arnett will benefit from skilled PT to address above deficits to improve mobility and activity tolerance to help reach the maximal level of functional independence and mobility. Patient demonstrates understanding of this POC and is in agreement with this plan.   OBJECTIVE IMPAIRMENTS: Abnormal gait, decreased activity tolerance, decreased balance, decreased coordination, decreased knowledge of condition, difficulty walking, decreased strength, dizziness, impaired flexibility, improper body mechanics, and postural dysfunction.   ACTIVITY LIMITATIONS: standing, squatting, and locomotion level  PARTICIPATION LIMITATIONS: community activity  PERSONAL FACTORS: Fitness, Past/current experiences, Time since onset of injury/illness/exacerbation, and 3+ comorbidities: Anemia, HTN, thyroid  disease/hypothyroidism, RA, OA, severe obesity, SLE, GERD, R breast lumpectomy, cholecystectomy 08/11/23  are also affecting patient's functional outcome.   REHAB POTENTIAL: Good  CLINICAL DECISION MAKING: Evolving/moderate complexity  EVALUATION COMPLEXITY: Moderate   GOALS: Goals reviewed with patient? Yes  SHORT TERM GOALS: Target date: 11/28/2023  Patient will be independent with initial HEP to improve outcomes and carryover.  Baseline:  10/26/23 - HEP initiated today 11/02/23 - HEP updated today  Goal status: MET - 11/30/23 pt is consistent with HEP, made modifications today  2.  Patient will be educated on strategies to decrease risk of falls.  Baseline:  Goal status: IN PROGRESS  3.  Patient will demonstrate decreased TUG time to </= 13.5 sec to decrease risk for falls with transitional mobility. Baseline: 15.0 sec Goal status: MET - 11/09/23 - 11.64 sec  4.  Patient will improve 5xSTS time to </= 20 seconds for improved efficiency and safety with transfers. Baseline: 24.78 sec 11/16/23 20 sec no UE  support Goal status: PARTIALLY MET - 11/30/23 - 1st attempt: 23.0s no UE support, 2nd attempt: 19.2s no UE support w/ less control of descent   LONG TERM GOALS: Target date: 01/16/2024  Patient will be independent with advanced/ongoing HEP to facilitate ability to maintain/progress functional gains from skilled physical therapy services. Baseline:  Goal status: IN PROGRESS  2.  Patient will be able to ambulate 600' w/o AD on variable surfaces with good safety to access community.  Baseline:  Goal status: IN PROGRESS  3.  Patient will be able to step up/down curb safely with LRAD for safety with community ambulation.  Baseline:  Goal status: IN PROGRESS   4.  Patient will demonstrate improved B LE strength to >/= 4 to 4+/5 for improved stability and ease of mobility . Baseline: Refer to above LE MMT table Goal status: IN PROGRESS- 11/30/23- met for all except hip abd, ext, and L hip ER  5.  Patient will improve 5xSTS time to </= 15 seconds for improved efficiency and safety with transfers. Baseline: 24.78 sec Goal status: IN PROGRESS   6.  Patient will improve Berg score by at least 8 points or to >/= 52/56 to improve safety and stability with ADLs in standing and reduce risk for falls. (MCID= 8 points)  Baseline: 49/56 (10/26/23) Goal status: IN PROGRESS  7.  Patient will improve FGA score to at least 19/30 to improve gait stability and reduce risk for falls. Baseline: 14/30 Goal status: IN PROGRESS  8.  Patient will report >/= 75% on ABC scale (MCID = 19%) to demonstrate improved balance confidence with functional mobility and gait. Baseline: 890 / 1600 = 55.6 % Goal status: IN PROGRESS   PLAN:  PT FREQUENCY: 2x/week  PT DURATION: 8-12 weeks  PLANNED INTERVENTIONS: 97164- PT Re-evaluation, 97750- Physical Performance Testing, 97110-Therapeutic exercises, 97530- Therapeutic activity, 97112- Neuromuscular re-education, 97535- Self Care, [id number]- Manual therapy, [phone]- Gait  training, [phone]- Aquatic Therapy, [phone]- Electrical stimulation (unattended), 97016- Vasopneumatic device, [id number]- Ionotophoresis 4mg /ml Dexamethasone , 79439 (1-2 muscles), 20561 (3+ muscles)- Dry Needling, Patient/Family education, Balance training, Stair training, Taping, Joint mobilization, DME instructions, and Cryotherapy  PLAN FOR NEXT SESSION: provide education in fall prevention strategies; continue progressing core/lumbopelvic and LE strengthening; initiate balance training to decrease fall risk, continue stepping activities for balance    Darnetta Kesselman, Student-PT 11/30/2023, 9:57 AM

## 2023-12-02 ENCOUNTER — Ambulatory Visit

## 2023-12-05 ENCOUNTER — Ambulatory Visit: Admitting: Physical Therapy

## 2023-12-05 DIAGNOSIS — R296 Repeated falls: Secondary | ICD-10-CM

## 2023-12-05 DIAGNOSIS — R2681 Unsteadiness on feet: Secondary | ICD-10-CM

## 2023-12-05 DIAGNOSIS — M6281 Muscle weakness (generalized): Secondary | ICD-10-CM

## 2023-12-05 DIAGNOSIS — R262 Difficulty in walking, not elsewhere classified: Secondary | ICD-10-CM

## 2023-12-05 NOTE — Therapy (Signed)
 OUTPATIENT PHYSICAL THERAPY TREATMENT   Patient Name: DHRITHI RICHE MRN: [id number] DOB:10/13/1960, 63 y.o., female Today's Date: 12/05/2023   END OF SESSION:  PT End of Session - 12/05/23 1025     Visit Number 9    Date for PT Re-Evaluation 01/16/24    Authorization Type [employer]    Authorization Time Period VL: 60    PT Start Time 1025   pt arrived late   PT Stop Time 1104    PT Time Calculation (min) 39 min    Activity Tolerance Patient tolerated treatment well    Behavior During Therapy WFL for tasks assessed/performed               Past Medical History:  Diagnosis Date   Abdominal pain, other specified site    Acute upper respiratory infections of unspecified site    Allergy    seasonal   Anemia    Benign neoplasm of skin, site unspecified    Disturbance of skin sensation    Diverticulosis of colon (without mention of hemorrhage)    Family history of diabetes mellitus    Family history of ischemic heart disease    Family history of malignant neoplasm of genital organ, other    GERD (gastroesophageal reflux disease)    Hypertension    Hypokalemia    Hypothyroidism    Left shoulder pain    Low blood potassium    Post-menopausal    Rheumatoid arthritis (HCC)    Sleep apnea    wears cpap    Sprain and strain of unspecified site of knee and leg    Sprain of neck    Thyroid  disease    Unspecified essential hypertension    Past Surgical History:  Procedure Laterality Date   ABDOMINAL HYSTERECTOMY     BREAST BIOPSY Right 10/30/2021   BREAST BIOPSY Left 11/28/2023   MM LT BREAST BX W LOC DEV 1ST LESION IMAGE BX SPEC STEREO GUIDE 11/28/2023 [facility]   BREAST LUMPECTOMY WITH RADIOACTIVE SEED LOCALIZATION Right 12/30/2021   Procedure: RIGHT BREAST LUMPECTOMY WITH RADIOACTIVE SEED LOCALIZATION;  Surgeon: Vanderbilt Ned, MD;  Location: [facility];  Service: General;  Laterality: Right;   BUNIONECTOMY  2005   right foot   CHOLECYSTECTOMY N/A  08/11/2023   Procedure: LAPAROSCOPIC CHOLECYSTECTOMY;  Surgeon: Vanderbilt Ned, MD;  Location: [facility];  Service: General;  Laterality: N/A;  WITH ICG DYE   COLONOSCOPY  6-12/2008   SHOULDER SURGERY     left;spurs 2-09- dr cayetano   TOTAL ABDOMINAL HYSTERECTOMY W/ BILATERAL SALPINGOOPHORECTOMY  2002   Patient Active Problem List   Diagnosis Date Noted   Diarrhea of presumed infectious origin 04/15/2023   Preventative health care 06/09/2021   Peripheral edema 02/27/2020   OSA on CPAP 09/19/2019   Acute non-recurrent pansinusitis 04/26/2019   Counseled about COVID-19 virus infection 04/26/2019   Insulin  resistance 03/07/2019   Vitamin D  deficiency 03/07/2019   Diverticulosis 03/07/2019   Obesity with alveolar hypoventilation and body mass 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 (HCC) 03/01/2019   Excessive daytime sleepiness 03/01/2019   Class 3 severe obesity with serious comorbidity and body mass index (BMI) of 45.0 to 49.9 in adult 03/01/2019   Gastroesophageal reflux disease without esophagitis 03/01/2019   Hypothyroidism 06/15/2018   SLE (systemic lupus erythematosus related syndrome) (HCC), followed by Rheumatology, Rx Plaquenil  10/13/2017   Hyperlipidemia 01/20/2012   Osteoarthritis 04/23/2009   Pain of upper abdomen 09/13/2008   Essential hypertension 05/18/2006  PCP: Antonio Meth, Jamee SAUNDERS, DO   REFERRING PROVIDER: Antonio Meth Jamee SAUNDERS, DO  REFERRING DIAG: R29.6 (ICD-10-CM) - Falls frequently   THERAPY DIAG:  Repeated falls  Unsteadiness on feet  Muscle weakness (generalized)  Difficulty in walking, not elsewhere classified  RATIONALE FOR EVALUATION AND TREATMENT: Rehabilitation  ONSET DATE: 08/17/2023  NEXT MD VISIT: 04/30/2024   SUBJECTIVE:                                                                                                                                                                                                         SUBJECTIVE STATEMENT: Pt reports  she has been dealing with personal things with family regarding her mother. Her mother had a scare on Friday so, she has been caring for her more. Has been doing her HEP when she can.   EVAL:  Pt reports she has felt like she has been walking leaning to 1 side or off-balance for a while now.  On 08/17/23, 6 days after her gall bladder surgery on 08/11/23 she fell while going to her mailbox. Then after returning to work on 09/06/23, she fell again.  She cannot recall any preceding events leading to the fall (no tripping or lightheadedness).  Due to the falls she has lost her confidence with walking and now avoids walking for exercise like she used to due to fear of possibility of falling again.  PAIN: Are you having pain? Yes: NPRS scale: 3/10 (R knee)  Pain location: R knee  Pain description: punching ache when she walks  Aggravating factors: walking  Relieving factors: moving knee while sitting, Aleve    PERTINENT HISTORY:  Anemia, HTN, thyroid  disease/hypothyroidism, RA, OA - advanced R knee bone-on-bone, severe obesity, SLE, GERD, R breast lumpectomy, cholecystectomy 08/11/23   PRECAUTIONS: Fall  RED FLAGS: None  WEIGHT BEARING RESTRICTIONS: No  FALLS:  Has patient fallen in last 6 months? Yes. 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   OBJECTIVE: (objective measures completed at initial evaluation unless otherwise dated)  DIAGNOSTIC FINDINGS:  08/17/23 - EXAM: RIGHT RIBS - 2 VIEW CLINICAL DATA:  Right rib  pain after fall yesterday. FINDINGS:   No fracture or other bone lesions are seen involving the ribs. IMPRESSION: Negative.  COGNITION: Overall cognitive status: Within functional limits for tasks assessed   SENSATION: WFL  COORDINATION: Grossly WFL but heel to shin limited by limited hip ROM  POSTURE:  Wide BOS  MUSCLE LENGTH: Hamstrings: mild tight B ITB: mild tight B Piriformis: mild tight B Hip flexors: mod tight R>L Quads: mod tight R>L Heelcord:   LOWER EXTREMITY MMT:    MMT Right eval Left eval R 11/30/23 L 11/30/23  Hip flexion 4 4 4+ 4  Hip extension 3+ 4- 4- 4-  Hip abduction 3+ 3+ 4- 4-  Hip adduction 3+ 3+ 4+ 4+  Hip internal rotation 4 4+ 4+ 4+  Hip external rotation 4- 4- 4+ 4-  Knee flexion 4- 4 4 4+  Knee extension 4 4+ 5 5  Ankle dorsiflexion 4+ 4+ 4+ 4+  Ankle plantarflexion      Ankle inversion      Ankle eversion      (Blank rows = not tested)  BED MOBILITY:  Independent  TRANSFERS: Assistive device utilized: None  Sit to stand: Complete Independence and Modified independence - patient noted to demonstrate increased genu valgum with thighs pressing against each other, more pronounced with fatigue Stand to sit: Complete Independence Chair to chair: NT Floor: NT  GAIT: Distance walked: clinic distances Assistive device utilized: None Level of assistance: Complete Independence Gait pattern: decreased stride length, lateral hip instability, lateral lean- Right, lateral lean- Left, and wide BOS Comments: increased lateral sway   STAIRS:  Level of Assistance: SBA  Stair Negotiation Technique:Forwards Alternating Pattern on ascent, Step to Pattern on descent with Single Rail on Left  Number of Stairs: 14   Height of Stairs: 7 Comments: step-to pattern on descent leading with R LE d/t bone on bone OA in R knee  FUNCTIONAL TESTS:  5 times sit to stand: 24.78 sec, >15 sec indicates risk for recurrent falls Timed up and go (TUG): 15.00 sec, >13.5 sec indicates increased risk for  falls 10 meter walk test: 10.06 sec Gait speed: 3.26 ft/sec Berg Balance Scale: 49/56, 46-51 moderate (>50%) risk for falls (10/26/23) Functional gait assessment: 14/30, < 19 = high risk fall    PATIENT SURVEYS:  ABC scale: 890 / 1600 = 55.6 %, indicates moderate level of physical functioning with an increased risk for recurrent falls   TODAY'S TREATMENT:  12/05/23 THERAPEUTIC EXERCISE: To improve strength, endurance, ROM, and flexibility. NuStep L5x51min  Standing Marches, 3# 3x30 - slow and controlled  THERAPEUTIC ACTIVITIES: To improve functional performance.  Fwd Step Ups w/ high knee marches x 10 B, 4 step  STS x15 from chair, no UE support  Lateral Step ups and Over on 4 step 2x10 B  NEUROMUSCULAR RE-EDUCATION: To improve kinesthesia and proprioception SLS 3x30s, B flat surface w/ countertop support  R SLS clock balance (12,3,6 o'clock)  R SLS w/ contra hip ext to tap cone at 6 o'clock   Standing hip abd x15 B  Standing hip ext x10 B   11/30/23 THERAPEUTIC EXERCISE: To improve strength, endurance, ROM, and flexibility.  NuStep L5x17min  Seated Marches, 2x30   THERAPEUTIC ACTIVITIES: To improve functional performance.  5xSTS  LE MMT Fwd Step ups w/ High Knee March, x10 B w/ 3# 4- pt required CGA for both, needed to hold on ladder w/ leading with RLE  Lateral Stepping onto 4 2x10 B   NEUROMUSCULAR RE-EDUCATION:  To improve kinesthesia and proprioception.  Standing hip abd, x15 B 3#  Standing hip ext x15 , B 3#  Standing hip add x10, B 3#   11/16/23 THERAPEUTIC EXERCISE: To improve strength, endurance, ROM, and flexibility.  NuStep L5x6 min Assess 5xSTS see STG#4 Leg curls 25lb 2x10 BLE Leg ext 10lb x 20 BLE  THERAPEUTIC ACTIVITIES: To improve functional performance.  Single leg RDL 2x10 at counter BLE Sidesteps RTB at ankles 4x20 ft Monster walks RTB at ankes 4x71ft Lateral step ups 4' RLE x 20 with UE support Fwd step ups 4' RLE x 20 UE  support   11/09/23 THERAPEUTIC EXERCISE: To improve strength, endurance, ROM, and flexibility.  NuStep L5x6 min Leg curls 20lb 2x10 BLE  NEUROMUSCULAR RE-EDUCATION: To improve kinesthesia and proprioception.  Mini squat 2x10 at counter Single leg RDL 2 x 10 at counter BLE Standing hip extension 2 x 10 BLE 2lb Standing hip abduction 2 x 10 BLE 2lb   11/07/23 THERAPEUTIC EXERCISE: To improve strength, endurance, ROM, and flexibility.  NuStep L3x6 min  LTRs- w/ 3s holds  SLR BLE x 10 ea   NEUROMUSCULAR RE-EDUCATION: To improve coordination, kinesthesia, posture, and proprioception.  Bridges + TrA + abd- 2x10  Standing hip extension x 10 BLE Standing hip abduction 10 BLE Standing Marches w/ 3# ankle wts- 3x1'  Side Steps (hallway) w/ 3# ankle wts - 4x10 ft STS x10- pt instructed to stagger L foot slightly in front of R to offload RLE  Kinesiotape - chondromalacia patella pattern to R knee - 2 I strips medial and lateral to patella originating from tibial tuberosity and meeting at distal quads + horiz strip lateral to medial over distal patella/patellar tendon   PATIENT EDUCATION:  Education details: HEP review and HEP update  Person educated: Patient Education method: Explanation, Demonstration, Verbal cues, and MedBridge[employer] app updated Education comprehension: verbalized understanding, returned demonstration, verbal cues required, tactile cues required, and needs further education  HOME EXERCISE PROGRAM: Access Code: [email or web address] URL: [email or web address] Date: 11/30/2023 Prepared by: Eusebio Saba  Exercises - Supine Transversus Abdominis Bracing - Hands on Stomach  - 2 x daily - 7 x weekly - 2 sets - 10 reps - 5 sec hold - Supine Hip Adduction Isometric with Ball  - 2 x daily - 7 x weekly - 2 sets - 10 reps - 5 sec hold - Hooklying Single Leg Bent Knee Fallouts with Resistance  - 2 x daily - 7 x weekly - 2 sets - 10 reps - 3 sec hold - Seated March  - 1  x daily - 7 x weekly - 3 sets - 10 reps - Sit to Stand Without Arm Support  - 1 x daily - 7 x weekly - 2 sets - 10 reps - 3 sec hold - Standing Marching  - 1 x daily - 7 x weekly - 2 sets - 10 reps - Standing Hip Abduction with Resistance at Ankles and Counter Support  - 1 x daily - 7 x weekly - 2 sets - 10 reps - Standing Hip Extension with Resistance at Ankles and Counter Support  - 1 x daily - 7 x weekly - 2 sets - 10 reps - Band Walks  - 1 x daily - 7 x weekly - 2 sets - 10 reps   ASSESSMENT:  CLINICAL IMPRESSION: Addisson is still not having much pain. She states that her R knee pain did return some on Friday but, not as bad as before. She  states that she got up out of bed in a hurry to care for her ill mother and thinks she got up on her knee wrong. She has been doing her HEP whenever she can. Her time is limited right now since she is having to care for her mother more. Today, pt was able to perform all activities without any increases in her R knee pain which stayed at 3/10 for the duration of the session. She was introduced to more SLS activities today which she expressed having tried to do more of at home. She was able to maintain R SLS in 30 bouts with occasional UE support on the counter top. Pt has inc'd difficulty with performing R SLS with cone taps going behind her to the 6 o'clock point which could be a combination of her hip extension weakness and difficulty with balance. Pt's STS quality and technique is improving, noted less use of momentum to stand up today, pt was able to perform 15 reps at an inc'd speed until around #9. Lael will benefit from skilled PT to address above deficits to improve mobility and activity tolerance to help reach the maximal level of functional independence and mobility.    EVAL: JEARLEAN DEMAURO is a 63 y.o. female who was referred to physical therapy for evaluation and treatment for frequent falls.  She has experienced 2 falls in the past 6 months.  Patient  presents with physical impairments of B LE weakness, impaired activity tolerance, impaired standing balance, impaired ambulation, and decreased safety awareness impacting safe and independent functional mobility.  Gait speed 3.26 ft/sec consistent with community ambulator (2.62 ft/sec is needed for community access), but shy of normal walking speed of 4.37 ft/sec.  Examination revealed patient is at risk for falls and functional decline as evidenced by the following objective test measures: TUG of 15.00 sec (>13.5 sec indicates increased risk for falls), 5xSTS of 24.78 sec (>15 sec indicates increased risk for falls and decreased BLE power), and FGA score of 14/30 (<19/30 indicates high risk for falls (.  ABC scale score of 55.6% indicates a moderate level of physical functioning with an increased risk for recurrent falls.  Skyllar will benefit from skilled PT to address above deficits to improve mobility and activity tolerance to help reach the maximal level of functional independence and mobility. Patient demonstrates understanding of this POC and is in agreement with this plan.   OBJECTIVE IMPAIRMENTS: Abnormal gait, decreased activity tolerance, decreased balance, decreased coordination, decreased knowledge of condition, difficulty walking, decreased strength, dizziness, impaired flexibility, improper body mechanics, and postural dysfunction.   ACTIVITY LIMITATIONS: standing, squatting, and locomotion level  PARTICIPATION LIMITATIONS: community activity  PERSONAL FACTORS: Fitness, Past/current experiences, Time since onset of injury/illness/exacerbation, and 3+ comorbidities: Anemia, HTN, thyroid  disease/hypothyroidism, RA, OA, severe obesity, SLE, GERD, R breast lumpectomy, cholecystectomy 08/11/23  are also affecting patient's functional outcome.   REHAB POTENTIAL: Good  CLINICAL DECISION MAKING: Evolving/moderate complexity  EVALUATION COMPLEXITY: Moderate   GOALS: Goals reviewed with patient?  Yes  SHORT TERM GOALS: Target date: 11/28/2023  Patient will be independent with initial HEP to improve outcomes and carryover.  Baseline:  10/26/23 - HEP initiated today 11/02/23 - HEP updated today  Goal status: MET - 11/30/23 pt is consistent with HEP, made modifications today  2.  Patient will be educated on strategies to decrease risk of falls.  Baseline:  Goal status: IN PROGRESS  3.  Patient will demonstrate decreased TUG time to </= 13.5 sec to decrease  risk for falls with transitional mobility. Baseline: 15.0 sec Goal status: MET - 11/09/23 - 11.64 sec  4.  Patient will improve 5xSTS time to </= 20 seconds for improved efficiency and safety with transfers. Baseline: 24.78 sec 11/16/23 20 sec no UE support Goal status: PARTIALLY MET - 11/30/23 - 1st attempt: 23.0s no UE support, 2nd attempt: 19.2s no UE support w/ less control of descent   LONG TERM GOALS: Target date: 01/16/2024  Patient will be independent with advanced/ongoing HEP to facilitate ability to maintain/progress functional gains from skilled physical therapy services. Baseline:  Goal status: IN PROGRESS  2.  Patient will be able to ambulate 600' w/o AD on variable surfaces with good safety to access community.  Baseline:  Goal status: IN PROGRESS  3.  Patient will be able to step up/down curb safely with LRAD for safety with community ambulation.  Baseline:  Goal status: IN PROGRESS   4.  Patient will demonstrate improved B LE strength to >/= 4 to 4+/5 for improved stability and ease of mobility . Baseline: Refer to above LE MMT table Goal status: IN PROGRESS- 11/30/23- met for all except hip abd, ext, and L hip ER  5.  Patient will improve 5xSTS time to </= 15 seconds for improved efficiency and safety with transfers. Baseline: 24.78 sec Goal status: IN PROGRESS   6.  Patient will improve Berg score by at least 8 points or to >/= 52/56 to improve safety and stability with ADLs in standing and reduce risk for  falls. (MCID= 8 points)  Baseline: 49/56 (10/26/23) Goal status: IN PROGRESS  7.  Patient will improve FGA score to at least 19/30 to improve gait stability and reduce risk for falls. Baseline: 14/30 Goal status: IN PROGRESS  8.  Patient will report >/= 75% on ABC scale (MCID = 19%) to demonstrate improved balance confidence with functional mobility and gait. Baseline: 890 / 1600 = 55.6 % Goal status: IN PROGRESS   PLAN:  PT FREQUENCY: 2x/week  PT DURATION: 8-12 weeks  PLANNED INTERVENTIONS: 02835- PT Re-evaluation, 97750- Physical Performance Testing, 97110-Therapeutic exercises, 97530- Therapeutic activity, 97112- Neuromuscular re-education, 97535- Self Care, 02859- Manual therapy, [phone]- Gait training, [phone]- Aquatic Therapy, [phone]- Electrical stimulation (unattended), [id number]- Vasopneumatic device, [id number]- Ionotophoresis 4mg /ml Dexamethasone , 79439 (1-2 muscles), 20561 (3+ muscles)- Dry Needling, Patient/Family education, Balance training, Stair training, Taping, Joint mobilization, DME instructions, and Cryotherapy  PLAN FOR NEXT SESSION: provide education in fall prevention strategies (STG #2); continue progressing core/lumbopelvic and LE strengthening; continue with balance training to decrease fall risk, continue stepping activities for balance (focus on RLE- seems more difficult for pt)    Eusebio Saba, Student-PT 12/05/2023, 12:33 PM

## 2023-12-08 ENCOUNTER — Ambulatory Visit

## 2023-12-08 DIAGNOSIS — R296 Repeated falls: Secondary | ICD-10-CM | POA: Diagnosis not present

## 2023-12-08 DIAGNOSIS — R262 Difficulty in walking, not elsewhere classified: Secondary | ICD-10-CM

## 2023-12-08 DIAGNOSIS — M6281 Muscle weakness (generalized): Secondary | ICD-10-CM

## 2023-12-08 DIAGNOSIS — R2681 Unsteadiness on feet: Secondary | ICD-10-CM

## 2023-12-08 NOTE — Therapy (Signed)
 OUTPATIENT PHYSICAL THERAPY TREATMENT   Patient Name: [name] MRN: [id number] DOB:11/29/1960, 63 y.o., female Today's Date: 12/08/2023   END OF SESSION:  PT End of Session - 12/08/23 0900     Visit Number 10    Date for PT Re-Evaluation 01/16/24    Authorization Type [employer]    Authorization Time Period VL: 60    PT Start Time 0859    PT Stop Time 0930    PT Time Calculation (min) 31 min    Activity Tolerance Patient tolerated treatment well    Behavior During Therapy WFL for tasks assessed/performed               Past Medical History:  Diagnosis Date   Abdominal pain, other specified site    Acute upper respiratory infections of unspecified site    Allergy    seasonal   Anemia    Benign neoplasm of skin, site unspecified    Disturbance of skin sensation    Diverticulosis of colon (without mention of hemorrhage)    Family history of diabetes mellitus    Family history of ischemic heart disease    Family history of malignant neoplasm of genital organ, other    GERD (gastroesophageal reflux disease)    Hypertension    Hypokalemia    Hypothyroidism    Left shoulder pain    Low blood potassium    Post-menopausal    Rheumatoid arthritis (HCC)    Sleep apnea    wears cpap    Sprain and strain of unspecified site of knee and leg    Sprain of neck    Thyroid  disease    Unspecified essential hypertension    Past Surgical History:  Procedure Laterality Date   ABDOMINAL HYSTERECTOMY     BREAST BIOPSY Right 10/30/2021   BREAST BIOPSY Left 11/28/2023   MM LT BREAST BX W LOC DEV 1ST LESION IMAGE BX SPEC STEREO GUIDE 11/28/2023 [facility]   BREAST LUMPECTOMY WITH RADIOACTIVE SEED LOCALIZATION Right 12/30/2021   Procedure: RIGHT BREAST LUMPECTOMY WITH RADIOACTIVE SEED LOCALIZATION;  Surgeon: Vanderbilt Ned, MD;  Location: [facility];  Service: General;  Laterality: Right;   BUNIONECTOMY  2005   right foot   CHOLECYSTECTOMY N/A 08/11/2023    Procedure: LAPAROSCOPIC CHOLECYSTECTOMY;  Surgeon: Vanderbilt Ned, MD;  Location: [facility];  Service: General;  Laterality: N/A;  WITH ICG DYE   COLONOSCOPY  6-12/2008   SHOULDER SURGERY     left;spurs 2-09- dr cayetano   TOTAL ABDOMINAL HYSTERECTOMY W/ BILATERAL SALPINGOOPHORECTOMY  2002   Patient Active Problem List   Diagnosis Date Noted   Diarrhea of presumed infectious origin 04/15/2023   Preventative health care 06/09/2021   Peripheral edema 02/27/2020   OSA on CPAP 09/19/2019   Acute non-recurrent pansinusitis 04/26/2019   Counseled about COVID-19 virus infection 04/26/2019   Insulin  resistance 03/07/2019   Vitamin D  deficiency 03/07/2019   Diverticulosis 03/07/2019   Obesity with alveolar hypoventilation and body mass 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 (HCC) 03/01/2019   Excessive daytime sleepiness 03/01/2019   Class 3 severe obesity with serious comorbidity and body mass index (BMI) of 45.0 to 49.9 in adult 03/01/2019   Gastroesophageal reflux disease without esophagitis 03/01/2019   Hypothyroidism 06/15/2018   SLE (systemic lupus erythematosus related syndrome) (HCC), followed by Rheumatology, Rx Plaquenil  10/13/2017   Hyperlipidemia 01/20/2012   Osteoarthritis 04/23/2009   Pain of upper abdomen 09/13/2008   Essential hypertension 05/18/2006    PCP: Antonio Meth,  Jamee SAUNDERS, DO   REFERRING PROVIDER: Antonio Meth, Jamee SAUNDERS, DO  REFERRING DIAG: R29.6 (ICD-10-CM) - Falls frequently   THERAPY DIAG:  Repeated falls  Unsteadiness on feet  Muscle weakness (generalized)  Difficulty in walking, not elsewhere classified  RATIONALE FOR EVALUATION AND TREATMENT: Rehabilitation  ONSET DATE: 08/17/2023  NEXT MD VISIT: 04/30/2024   SUBJECTIVE:                                                                                                                                                                                                         SUBJECTIVE STATEMENT: Pain last Friday went up  to 6/10, she was at hospital with mom and she turned the wrong way and her R knee popped, the rest of the weekend she had increased pain.  EVAL:  Pt reports she has felt like she has been walking leaning to 1 side or off-balance for a while now.  On 08/17/23, 6 days after her gall bladder surgery on 08/11/23 she fell while going to her mailbox. Then after returning to work on 09/06/23, she fell again.  She cannot recall any preceding events leading to the fall (no tripping or lightheadedness).  Due to the falls she has lost her confidence with walking and now avoids walking for exercise like she used to due to fear of possibility of falling again.  PAIN: Are you having pain? Yes: NPRS scale: 2-3/10 (R knee)  Pain location: R knee  Pain description: punching ache when she walks  Aggravating factors: walking  Relieving factors: moving knee while sitting, Aleve    PERTINENT HISTORY:  Anemia, HTN, thyroid  disease/hypothyroidism, RA, OA - advanced R knee bone-on-bone, severe obesity, SLE, GERD, R breast lumpectomy, cholecystectomy 08/11/23   PRECAUTIONS: Fall  RED FLAGS: None  WEIGHT BEARING RESTRICTIONS: No  FALLS:  Has patient fallen in last 6 months? Yes. 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   OBJECTIVE: (objective measures completed at initial evaluation unless otherwise dated)  DIAGNOSTIC FINDINGS:  08/17/23 - EXAM: RIGHT RIBS - 2 VIEW CLINICAL DATA:  Right rib pain after fall yesterday. FINDINGS:  No fracture  or other bone lesions are seen  involving the ribs. IMPRESSION: Negative.  COGNITION: Overall cognitive status: Within functional limits for tasks assessed   SENSATION: WFL  COORDINATION: Grossly WFL but heel to shin limited by limited hip ROM  POSTURE:  Wide BOS  MUSCLE LENGTH: Hamstrings: mild tight B ITB: mild tight B Piriformis: mild tight B Hip flexors: mod tight R>L Quads: mod tight R>L Heelcord:   LOWER EXTREMITY MMT:    MMT Right eval Left eval R 11/30/23 L 11/30/23  Hip flexion 4 4 4+ 4  Hip extension 3+ 4- 4- 4-  Hip abduction 3+ 3+ 4- 4-  Hip adduction 3+ 3+ 4+ 4+  Hip internal rotation 4 4+ 4+ 4+  Hip external rotation 4- 4- 4+ 4-  Knee flexion 4- 4 4 4+  Knee extension 4 4+ 5 5  Ankle dorsiflexion 4+ 4+ 4+ 4+  Ankle plantarflexion      Ankle inversion      Ankle eversion      (Blank rows = not tested)  BED MOBILITY:  Independent  TRANSFERS: Assistive device utilized: None  Sit to stand: Complete Independence and Modified independence - patient noted to demonstrate increased genu valgum with thighs pressing against each other, more pronounced with fatigue Stand to sit: Complete Independence Chair to chair: NT Floor: NT  GAIT: Distance walked: clinic distances Assistive device utilized: None Level of assistance: Complete Independence Gait pattern: decreased stride length, lateral hip instability, lateral lean- Right, lateral lean- Left, and wide BOS Comments: increased lateral sway   STAIRS:  Level of Assistance: SBA  Stair Negotiation Technique:Forwards Alternating Pattern on ascent, Step to Pattern on descent with Single Rail on Left  Number of Stairs: 14   Height of Stairs: 7 Comments: step-to pattern on descent leading with R LE d/t bone on bone OA in R knee  FUNCTIONAL TESTS:  5 times sit to stand: 24.78 sec, >15 sec indicates risk for recurrent falls Timed up and go (TUG): 15.00 sec, >13.5 sec indicates increased risk for falls 10 meter walk test: 10.06 sec Gait  speed: 3.26 ft/sec Berg Balance Scale: 49/56, 46-51 moderate (>50%) risk for falls (10/26/23) Functional gait assessment: 14/30, < 19 = high risk fall    PATIENT SURVEYS:  ABC scale: 890 / 1600 = 55.6 %, indicates moderate level of physical functioning with an increased risk for recurrent falls   TODAY'S TREATMENT:  12/08/23 THERAPEUTIC EXERCISE: To improve strength, endurance, ROM, and flexibility. NuStep L5x9min  Assessed curbs  NEUROMUSCULAR RE-EDUCATION: To improve kinesthesia and proprioception 5xSTS- 16.48 sec  [facility] PT Assessment - 12/08/23 0001       Berg Balance Test   Sit to Stand Able to stand without using hands and stabilize independently    Standing Unsupported Able to stand safely 2 minutes    Sitting with Back Unsupported but Feet Supported on Floor or Stool Able to sit safely and securely 2 minutes    Stand to Sit Sits safely with minimal use of hands    Transfers Able to transfer safely, minor use of hands    Standing Unsupported with Eyes Closed Able to stand 10 seconds safely    Standing Unsupported with Feet Together Able to place feet together independently and stand 1 minute safely    From Standing, Reach Forward with Outstretched Arm Can reach confidently >25 cm (10)    From Standing Position, Pick up Object from Floor Able to pick up shoe safely and easily    From  Standing Position, Turn to Look Behind Over each Shoulder Looks behind from both sides and weight shifts well    Turn 360 Degrees Able to turn 360 degrees safely in 4 seconds or less    Standing Unsupported, Alternately Place Feet on Step/Stool Able to stand independently and safely and complete 8 steps in 20 seconds    Standing Unsupported, One Foot in Front Able to place foot tandem independently and hold 30 seconds    Standing on One Leg Able to lift leg independently and hold > 10 seconds    Total Score 56      Functional Gait  Assessment   Gait Level Surface Walks 20 ft in less than 5.5 sec, no  assistive devices, good speed, no evidence for imbalance, normal gait pattern, deviates no more than 6 in outside of the 12 in walkway width.    Change in Gait Speed Able to smoothly change walking speed without loss of balance or gait deviation. Deviate no more than 6 in outside of the 12 in walkway width.    Gait with Horizontal Head Turns Performs head turns smoothly with no change in gait. Deviates no more than 6 in outside 12 in walkway width    Gait with Vertical Head Turns Performs head turns with no change in gait. Deviates no more than 6 in outside 12 in walkway width.    Gait and Pivot Turn Pivot turns safely within 3 sec and stops quickly with no loss of balance.    Step Over Obstacle Is able to step over 2 stacked shoe boxes taped together (9 in total height) without changing gait speed. No evidence of imbalance.    Gait with Narrow Base of Support Ambulates 7-9 steps.    Gait with Eyes Closed Walks 20 ft, uses assistive device, slower speed, mild gait deviations, deviates 6-10 in outside 12 in walkway width. Ambulates 20 ft in less than 9 sec but greater than 7 sec.    Ambulating Backwards Walks 20 ft, no assistive devices, good speed, no evidence for imbalance, normal gait    Steps Alternating feet, must use rail.    Total Score 27          12/05/23 THERAPEUTIC EXERCISE: To improve strength, endurance, ROM, and flexibility. NuStep L5x54min  Standing Marches, 3# 3x30 - slow and controlled  THERAPEUTIC ACTIVITIES: To improve functional performance.  Fwd Step Ups w/ high knee marches x 10 B, 4 step  STS x15 from chair, no UE support  Lateral Step ups and Over on 4 step 2x10 B  NEUROMUSCULAR RE-EDUCATION: To improve kinesthesia and proprioception SLS 3x30s, B flat surface w/ countertop support  R SLS clock balance (12,3,6 o'clock)  R SLS w/ contra hip ext to tap cone at 6 o'clock   Standing hip abd x15 B  Standing hip ext x10 B   11/30/23 THERAPEUTIC EXERCISE: To improve  strength, endurance, ROM, and flexibility.  NuStep L5x85min  Seated Marches, 2x30   THERAPEUTIC ACTIVITIES: To improve functional performance.  5xSTS  LE MMT Fwd Step ups w/ High Knee March, x10 B w/ 3# 4- pt required CGA for both, needed to hold on ladder w/ leading with RLE  Lateral Stepping onto 4 2x10 B   NEUROMUSCULAR RE-EDUCATION: To improve kinesthesia and proprioception.  Standing hip abd, x15 B 3#  Standing hip ext x15 , B 3#  Standing hip add x10, B 3#   11/16/23 THERAPEUTIC EXERCISE: To improve strength, endurance, ROM, and flexibility.  NuStep  L5x6 min Assess 5xSTS see STG#4 Leg curls 25lb 2x10 BLE Leg ext 10lb x 20 BLE  THERAPEUTIC ACTIVITIES: To improve functional performance.  Single leg RDL 2x10 at counter BLE Sidesteps RTB at ankles 4x20 ft Monster walks RTB at ankes 4x56ft Lateral step ups 4' RLE x 20 with UE support Fwd step ups 4' RLE x 20 UE support   11/09/23 THERAPEUTIC EXERCISE: To improve strength, endurance, ROM, and flexibility.  NuStep L5x6 min Leg curls 20lb 2x10 BLE  NEUROMUSCULAR RE-EDUCATION: To improve kinesthesia and proprioception.  Mini squat 2x10 at counter Single leg RDL 2 x 10 at counter BLE Standing hip extension 2 x 10 BLE 2lb Standing hip abduction 2 x 10 BLE 2lb   11/07/23 THERAPEUTIC EXERCISE: To improve strength, endurance, ROM, and flexibility.  NuStep L3x6 min  LTRs- w/ 3s holds  SLR BLE x 10 ea   NEUROMUSCULAR RE-EDUCATION: To improve coordination, kinesthesia, posture, and proprioception.  Bridges + TrA + abd- 2x10  Standing hip extension x 10 BLE Standing hip abduction 10 BLE Standing Marches w/ 3# ankle wts- 3x1'  Side Steps (hallway) w/ 3# ankle wts - 4x10 ft STS x10- pt instructed to stagger L foot slightly in front of R to offload RLE  Kinesiotape - chondromalacia patella pattern to R knee - 2 I strips medial and lateral to patella originating from tibial tuberosity and meeting at distal quads + horiz  strip lateral to medial over distal patella/patellar tendon   PATIENT EDUCATION:  Education details: HEP review and HEP update  Person educated: Patient Education method: Explanation, Demonstration, Verbal cues, and MedBridge[employer] app updated Education comprehension: verbalized understanding, returned demonstration, verbal cues required, tactile cues required, and needs further education  HOME EXERCISE PROGRAM: Access Code: [email or web address] URL: [email or web address] Date: 11/30/2023 Prepared by: Eusebio Saba  Exercises - Supine Transversus Abdominis Bracing - Hands on Stomach  - 2 x daily - 7 x weekly - 2 sets - 10 reps - 5 sec hold - Supine Hip Adduction Isometric with Ball  - 2 x daily - 7 x weekly - 2 sets - 10 reps - 5 sec hold - Hooklying Single Leg Bent Knee Fallouts with Resistance  - 2 x daily - 7 x weekly - 2 sets - 10 reps - 3 sec hold - Seated March  - 1 x daily - 7 x weekly - 3 sets - 10 reps - Sit to Stand Without Arm Support  - 1 x daily - 7 x weekly - 2 sets - 10 reps - 3 sec hold - Standing Marching  - 1 x daily - 7 x weekly - 2 sets - 10 reps - Standing Hip Abduction with Resistance at Ankles and Counter Support  - 1 x daily - 7 x weekly - 2 sets - 10 reps - Standing Hip Extension with Resistance at Ankles and Counter Support  - 1 x daily - 7 x weekly - 2 sets - 10 reps - Band Walks  - 1 x daily - 7 x weekly - 2 sets - 10 reps   ASSESSMENT:  CLINICAL IMPRESSION: [name] has progressed very well with PT. She has done very well on the BERG and FGA, taking her out of the fall risk categories. Her score on 5xSTS has improved from 24 sec to 16 secs. She was able to navigate a curb with no issues today. Assessment limited today d/t patient's late arrival. She has met LTGs #3, #6,#7 today and STG #3. Pt may be  able to transition to HEP early d/t progress she has been making. [name] will benefit from skilled PT to address above deficits to improve mobility and activity  tolerance to help reach the maximal level of functional independence and mobility.    EVAL: [name] is a 63 y.o. female who was referred to physical therapy for evaluation and treatment for frequent falls.  She has experienced 2 falls in the past 6 months.  Patient presents with physical impairments of B LE weakness, impaired activity tolerance, impaired standing balance, impaired ambulation, and decreased safety awareness impacting safe and independent functional mobility.  Gait speed 3.26 ft/sec consistent with community ambulator (2.62 ft/sec is needed for community access), but shy of normal walking speed of 4.37 ft/sec.  Examination revealed patient is at risk for falls and functional decline as evidenced by the following objective test measures: TUG of 15.00 sec (>13.5 sec indicates increased risk for falls), 5xSTS of 24.78 sec (>15 sec indicates increased risk for falls and decreased BLE power), and FGA score of 14/30 (<19/30 indicates high risk for falls (.  ABC scale score of 55.6% indicates a moderate level of physical functioning with an increased risk for recurrent falls.  [name] will benefit from skilled PT to address above deficits to improve mobility and activity tolerance to help reach the maximal level of functional independence and mobility. Patient demonstrates understanding of this POC and is in agreement with this plan.   OBJECTIVE IMPAIRMENTS: Abnormal gait, decreased activity tolerance, decreased balance, decreased coordination, decreased knowledge of condition, difficulty walking, decreased strength, dizziness, impaired flexibility, improper body mechanics, and postural dysfunction.   ACTIVITY LIMITATIONS: standing, squatting, and locomotion level  PARTICIPATION LIMITATIONS: community activity  PERSONAL FACTORS: Fitness, Past/current experiences, Time since onset of injury/illness/exacerbation, and 3+ comorbidities: Anemia, HTN, thyroid  disease/hypothyroidism, RA, OA, severe  obesity, SLE, GERD, R breast lumpectomy, cholecystectomy 08/11/23  are also affecting patient's functional outcome.   REHAB POTENTIAL: Good  CLINICAL DECISION MAKING: Evolving/moderate complexity  EVALUATION COMPLEXITY: Moderate   GOALS: Goals reviewed with patient? Yes  SHORT TERM GOALS: Target date: 11/28/2023  Patient will be independent with initial HEP to improve outcomes and carryover.  Baseline:  10/26/23 - HEP initiated today 11/02/23 - HEP updated today  Goal status: MET - 11/30/23 pt is consistent with HEP, made modifications today  2.  Patient will be educated on strategies to decrease risk of falls.  Baseline:  Goal status: MET- 12/08/23  3.  Patient will demonstrate decreased TUG time to </= 13.5 sec to decrease risk for falls with transitional mobility. Baseline: 15.0 sec Goal status: MET - 11/09/23 - 11.64 sec  4.  Patient will improve 5xSTS time to </= 20 seconds for improved efficiency and safety with transfers. Baseline: 24.78 sec 11/16/23 20 sec no UE support Goal status: MET - 12/08/23  LONG TERM GOALS: Target date: 01/16/2024  Patient will be independent with advanced/ongoing HEP to facilitate ability to maintain/progress functional gains from skilled physical therapy services. Baseline:  Goal status: IN PROGRESS  2.  Patient will be able to ambulate 600' w/o AD on variable surfaces with good safety to access community.  Baseline:  Goal status: IN PROGRESS  3.  Patient will be able to step up/down curb safely with LRAD for safety with community ambulation.  Baseline:  Goal status: MET- 12/08/23 no AD, safe negotiation   4.  Patient will demonstrate improved B LE strength to >/= 4 to 4+/5 for improved stability and ease of mobility .  Baseline: Refer to above LE MMT table Goal status: IN PROGRESS- 11/30/23- met for all except hip abd, ext, and L hip ER  5.  Patient will improve 5xSTS time to </= 15 seconds for improved efficiency and safety with  transfers. Baseline: 24.78 sec Goal status: IN PROGRESS - 12/08/23 16.48  6.  Patient will improve Berg score by at least 8 points or to >/= 52/56 to improve safety and stability with ADLs in standing and reduce risk for falls. (MCID= 8 points)  Baseline: 49/56 (10/26/23) Goal status: MET- 12/08/23 56/56  7.  Patient will improve FGA score to at least 19/30 to improve gait stability and reduce risk for falls. Baseline: 14/30 Goal status: MET- 12/08/23  8.  Patient will report >/= 75% on ABC scale (MCID = 19%) to demonstrate improved balance confidence with functional mobility and gait. Baseline: 890 / 1600 = 55.6 % Goal status: IN PROGRESS   PLAN:  PT FREQUENCY: 2x/week  PT DURATION: 8-12 weeks  PLANNED INTERVENTIONS: [id number]- PT Re-evaluation, 97750- Physical Performance Testing, 97110-Therapeutic exercises, 97530- Therapeutic activity, [id number]- Neuromuscular re-education, 97535- Self Care, [id number]- Manual therapy, [phone]- Gait training, [phone]- Aquatic Therapy, [phone]- Electrical stimulation (unattended), 97016- Vasopneumatic device, [id number]- Ionotophoresis 4mg /ml Dexamethasone , 79439 (1-2 muscles), 20561 (3+ muscles)- Dry Needling, Patient/Family education, Balance training, Stair training, Taping, Joint mobilization, DME instructions, and Cryotherapy  PLAN FOR NEXT SESSION: assess remaining goals; prepare for transition to HEP?   Sol LITTIE Gaskins, PTA 12/08/2023, 9:30 AM

## 2023-12-12 ENCOUNTER — Ambulatory Visit

## 2023-12-15 ENCOUNTER — Ambulatory Visit: Admitting: Rehabilitation

## 2023-12-15 ENCOUNTER — Encounter: Payer: Self-pay | Admitting: Rehabilitation

## 2023-12-15 DIAGNOSIS — R262 Difficulty in walking, not elsewhere classified: Secondary | ICD-10-CM

## 2023-12-15 DIAGNOSIS — M6281 Muscle weakness (generalized): Secondary | ICD-10-CM

## 2023-12-15 DIAGNOSIS — R2681 Unsteadiness on feet: Secondary | ICD-10-CM

## 2023-12-15 DIAGNOSIS — R296 Repeated falls: Secondary | ICD-10-CM

## 2023-12-15 NOTE — Therapy (Signed)
 OUTPATIENT PHYSICAL THERAPY TREATMENT   Patient Name: [name] MRN: [id number] DOB:1961-02-18, 63 y.o., female Today's Date: 12/15/2023   END OF SESSION:  PT End of Session - 12/15/23 0941     Visit Number 11    Date for PT Re-Evaluation 01/16/24    Authorization Type [employer]    Authorization Time Period VL: 60    PT Start Time 0900    PT Stop Time 0932    PT Time Calculation (min) 32 min    Activity Tolerance Patient tolerated treatment well    Behavior During Therapy WFL for tasks assessed/performed                Past Medical History:  Diagnosis Date   Abdominal pain, other specified site    Acute upper respiratory infections of unspecified site    Allergy    seasonal   Anemia    Benign neoplasm of skin, site unspecified    Disturbance of skin sensation    Diverticulosis of colon (without mention of hemorrhage)    Family history of diabetes mellitus    Family history of ischemic heart disease    Family history of malignant neoplasm of genital organ, other    GERD (gastroesophageal reflux disease)    Hypertension    Hypokalemia    Hypothyroidism    Left shoulder pain    Low blood potassium    Post-menopausal    Rheumatoid arthritis (HCC)    Sleep apnea    wears cpap    Sprain and strain of unspecified site of knee and leg    Sprain of neck    Thyroid  disease    Unspecified essential hypertension    Past Surgical History:  Procedure Laterality Date   ABDOMINAL HYSTERECTOMY     BREAST BIOPSY Right 10/30/2021   BREAST BIOPSY Left 11/28/2023   MM LT BREAST BX W LOC DEV 1ST LESION IMAGE BX SPEC STEREO GUIDE 11/28/2023 [facility]   BREAST LUMPECTOMY WITH RADIOACTIVE SEED LOCALIZATION Right 12/30/2021   Procedure: RIGHT BREAST LUMPECTOMY WITH RADIOACTIVE SEED LOCALIZATION;  Surgeon: Vanderbilt Ned, MD;  Location: [facility];  Service: General;  Laterality: Right;   BUNIONECTOMY  2005   right foot   CHOLECYSTECTOMY N/A 08/11/2023    Procedure: LAPAROSCOPIC CHOLECYSTECTOMY;  Surgeon: Vanderbilt Ned, MD;  Location: [facility];  Service: General;  Laterality: N/A;  WITH ICG DYE   COLONOSCOPY  6-12/2008   SHOULDER SURGERY     left;spurs 2-09- dr cayetano   TOTAL ABDOMINAL HYSTERECTOMY W/ BILATERAL SALPINGOOPHORECTOMY  2002   Patient Active Problem List   Diagnosis Date Noted   Diarrhea of presumed infectious origin 04/15/2023   Preventative health care 06/09/2021   Peripheral edema 02/27/2020   OSA on CPAP 09/19/2019   Acute non-recurrent pansinusitis 04/26/2019   Counseled about COVID-19 virus infection 04/26/2019   Insulin  resistance 03/07/2019   Vitamin D  deficiency 03/07/2019   Diverticulosis 03/07/2019   Obesity with alveolar hypoventilation and body mass 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 (HCC) 03/01/2019   Excessive daytime sleepiness 03/01/2019   Class 3 severe obesity with serious comorbidity and body mass index (BMI) of 45.0 to 49.9 in adult 03/01/2019   Gastroesophageal reflux disease without esophagitis 03/01/2019   Hypothyroidism 06/15/2018   SLE (systemic lupus erythematosus related syndrome) (HCC), followed by Rheumatology, Rx Plaquenil  10/13/2017   Hyperlipidemia 01/20/2012   Osteoarthritis 04/23/2009   Pain of upper abdomen 09/13/2008   Essential hypertension 05/18/2006    PCP: Antonio  Cyndee Jamee SAUNDERS, DO   REFERRING PROVIDER: Antonio Cyndee, Jamee SAUNDERS, DO  REFERRING DIAG: R29.6 (ICD-10-CM) - Falls frequently   THERAPY DIAG:  Repeated falls  Unsteadiness on feet  Muscle weakness (generalized)  Difficulty in walking, not elsewhere classified  RATIONALE FOR EVALUATION AND TREATMENT: Rehabilitation  ONSET DATE: 08/17/2023  NEXT MD VISIT: 04/30/2024   SUBJECTIVE:                                                                                                                                                                                                         SUBJECTIVE STATEMENT: States feels better.   Still having some R knee pain of 1-2/10.  Denies any falls or med changes  EVAL:  Pt reports she has felt like she has been walking leaning to 1 side or off-balance for a while now.  On 08/17/23, 6 days after her gall bladder surgery on 08/11/23 she fell while going to her mailbox. Then after returning to work on 09/06/23, she fell again.  She cannot recall any preceding events leading to the fall (no tripping or lightheadedness).  Due to the falls she has lost her confidence with walking and now avoids walking for exercise like she used to due to fear of possibility of falling again.  PAIN: Are you having pain? Yes: NPRS scale: 2-3/10 (R knee)  Pain location: R knee  Pain description: punching ache when she walks  Aggravating factors: walking  Relieving factors: moving knee while sitting, Aleve    PERTINENT HISTORY:  Anemia, HTN, thyroid  disease/hypothyroidism, RA, OA - advanced R knee bone-on-bone, severe obesity, SLE, GERD, R breast lumpectomy, cholecystectomy 08/11/23   PRECAUTIONS: Fall  RED FLAGS: None  WEIGHT BEARING RESTRICTIONS: No  FALLS:  Has patient fallen in last 6 months? Yes. 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   OBJECTIVE: (objective measures completed at initial evaluation unless otherwise dated)  DIAGNOSTIC FINDINGS:  08/17/23 - EXAM: RIGHT RIBS - 2 VIEW CLINICAL DATA:  Right rib pain after fall yesterday. FINDINGS:  No fracture or other bone lesions are seen involving the ribs. IMPRESSION: Negative.  COGNITION:  Overall  cognitive status: Within functional limits for tasks assessed   SENSATION: WFL  COORDINATION: Grossly WFL but heel to shin limited by limited hip ROM  POSTURE:  Wide BOS  MUSCLE LENGTH: Hamstrings: mild tight B ITB: mild tight B Piriformis: mild tight B Hip flexors: mod tight R>L Quads: mod tight R>L Heelcord:   LOWER EXTREMITY MMT:    MMT Right eval Left eval R 11/30/23 L 11/30/23  Hip flexion 4 4 4+ 4  Hip extension 3+ 4- 4- 4-  Hip abduction 3+ 3+ 4- 4-  Hip adduction 3+ 3+ 4+ 4+  Hip internal rotation 4 4+ 4+ 4+  Hip external rotation 4- 4- 4+ 4-  Knee flexion 4- 4 4 4+  Knee extension 4 4+ 5 5  Ankle dorsiflexion 4+ 4+ 4+ 4+  Ankle plantarflexion      Ankle inversion      Ankle eversion      (Blank rows = not tested)  BED MOBILITY:  Independent  TRANSFERS: Assistive device utilized: None  Sit to stand: Complete Independence and Modified independence - patient noted to demonstrate increased genu valgum with thighs pressing against each other, more pronounced with fatigue Stand to sit: Complete Independence Chair to chair: NT Floor: NT  GAIT: Distance walked: clinic distances Assistive device utilized: None Level of assistance: Complete Independence Gait pattern: decreased stride length, lateral hip instability, lateral lean- Right, lateral lean- Left, and wide BOS Comments: increased lateral sway   STAIRS:  Level of Assistance: SBA  Stair Negotiation Technique:Forwards Alternating Pattern on ascent, Step to Pattern on descent with Single Rail on Left  Number of Stairs: 14   Height of Stairs: 7 Comments: step-to pattern on descent leading with R LE d/t bone on bone OA in R knee  FUNCTIONAL TESTS:  5 times sit to stand: 24.78 sec, >15 sec indicates risk for recurrent falls Timed up and go (TUG): 15.00 sec, >13.5 sec indicates increased risk for falls 10 meter walk test: 10.06 sec Gait speed: 3.26 ft/sec Berg Balance Scale: 49/56, 46-51 moderate  (>50%) risk for falls (10/26/23) Functional gait assessment: 14/30, < 19 = high risk fall    PATIENT SURVEYS:  ABC scale: 890 / 1600 = 55.6 %, indicates moderate level of physical functioning with an increased risk for recurrent falls   TODAY'S TREATMENT:  12/15/23 THERAPEUTIC EXERCISE: To improve strength, endurance, ROM, and flexibility. NuStep L5x32min  THERAPEUTIC ACTIVITIES: To improve functional performance.  Demonstration, verbal and tactile cues throughout for technique. Standing hip ext RTB x 15 BLE Standing hip ABD RTB x 15 BLE Standing hip/knee flexion RTB x 15 BLE  NEUROMUSCULAR RE-EDUCATION: To improve balance. Monster walks 1/2 lap blue TB x 2 Clock touch 12:6 x 3 laps BLE Clock cone touch 12-6:00 x 3 laps BLE Step up to aerobic and foam x 10 BLE  12/08/23 THERAPEUTIC EXERCISE: To improve strength, endurance, ROM, and flexibility. NuStep L5x8min  Assessed curbs  NEUROMUSCULAR RE-EDUCATION: To improve kinesthesia and proprioception 5xSTS- 16.48 sec  [facility] PT Assessment - 12/08/23 0001       Berg Balance Test   Sit to Stand Able to stand without using hands and stabilize independently    Standing Unsupported Able to stand safely 2 minutes    Sitting with Back Unsupported but Feet Supported on Floor or Stool Able to sit safely and securely 2 minutes    Stand to Sit Sits safely with minimal use of hands    Transfers Able to transfer safely, minor  use of hands    Standing Unsupported with Eyes Closed Able to stand 10 seconds safely    Standing Unsupported with Feet Together Able to place feet together independently and stand 1 minute safely    From Standing, Reach Forward with Outstretched Arm Can reach confidently >25 cm (10)    From Standing Position, Pick up Object from Floor Able to pick up shoe safely and easily    From Standing Position, Turn to Look Behind Over each Shoulder Looks behind from both sides and weight shifts well    Turn 360 Degrees Able to turn 360  degrees safely in 4 seconds or less    Standing Unsupported, Alternately Place Feet on Step/Stool Able to stand independently and safely and complete 8 steps in 20 seconds    Standing Unsupported, One Foot in Front Able to place foot tandem independently and hold 30 seconds    Standing on One Leg Able to lift leg independently and hold > 10 seconds    Total Score 56      Functional Gait  Assessment   Gait Level Surface Walks 20 ft in less than 5.5 sec, no assistive devices, good speed, no evidence for imbalance, normal gait pattern, deviates no more than 6 in outside of the 12 in walkway width.    Change in Gait Speed Able to smoothly change walking speed without loss of balance or gait deviation. Deviate no more than 6 in outside of the 12 in walkway width.    Gait with Horizontal Head Turns Performs head turns smoothly with no change in gait. Deviates no more than 6 in outside 12 in walkway width    Gait with Vertical Head Turns Performs head turns with no change in gait. Deviates no more than 6 in outside 12 in walkway width.    Gait and Pivot Turn Pivot turns safely within 3 sec and stops quickly with no loss of balance.    Step Over Obstacle Is able to step over 2 stacked shoe boxes taped together (9 in total height) without changing gait speed. No evidence of imbalance.    Gait with Narrow Base of Support Ambulates 7-9 steps.    Gait with Eyes Closed Walks 20 ft, uses assistive device, slower speed, mild gait deviations, deviates 6-10 in outside 12 in walkway width. Ambulates 20 ft in less than 9 sec but greater than 7 sec.    Ambulating Backwards Walks 20 ft, no assistive devices, good speed, no evidence for imbalance, normal gait    Steps Alternating feet, must use rail.    Total Score 27          12/05/23 THERAPEUTIC EXERCISE: To improve strength, endurance, ROM, and flexibility. NuStep L5x25min  Standing Marches, 3# 3x30 - slow and controlled  THERAPEUTIC ACTIVITIES: To improve  functional performance.  Fwd Step Ups w/ high knee marches x 10 B, 4 step  STS x15 from chair, no UE support  Lateral Step ups and Over on 4 step 2x10 B  NEUROMUSCULAR RE-EDUCATION: To improve kinesthesia and proprioception SLS 3x30s, B flat surface w/ countertop support  R SLS clock balance (12,3,6 o'clock)  R SLS w/ contra hip ext to tap cone at 6 o'clock   Standing hip abd x15 B  Standing hip ext x10 B   PATIENT EDUCATION:  Education details: adding step ups to HEP  Person educated: Patient Education method: Explanation, Demonstration, Verbal cues, and MedBridge[employer] app updated Education comprehension: verbalized understanding, returned demonstration, verbal cues required,  tactile cues required, and needs further education  HOME EXERCISE PROGRAM: Access Code: [email or web address] URL: [email or web address] Date: 12/15/2023 Prepared by: Garnette Montclair  Exercises - Supine Transversus Abdominis Bracing - Hands on Stomach  - 2 x daily - 7 x weekly - 2 sets - 10 reps - 5 sec hold - Supine Hip Adduction Isometric with Ball  - 2 x daily - 7 x weekly - 2 sets - 10 reps - 5 sec hold - Hooklying Single Leg Bent Knee Fallouts with Resistance  - 2 x daily - 7 x weekly - 2 sets - 10 reps - 3 sec hold - Seated March  - 1 x daily - 7 x weekly - 3 sets - 10 reps - Sit to Stand Without Arm Support  - 1 x daily - 7 x weekly - 2 sets - 10 reps - 3 sec hold - Standing Marching  - 1 x daily - 7 x weekly - 2 sets - 10 reps - Standing Hip Abduction with Resistance at Ankles and Counter Support  - 1 x daily - 7 x weekly - 2 sets - 10 reps - Standing Hip Extension with Resistance at Ankles and Counter Support  - 1 x daily - 7 x weekly - 2 sets - 10 reps - Band Walks  - 1 x daily - 7 x weekly - 2 sets - 10 reps - Step Up  - 1 x daily - 7 x weekly - 3 sets - 10 reps - Lateral Step Up  - 1 x daily - 7 x weekly - 3 sets - 10 reps  ASSESSMENT:  CLINICAL IMPRESSION: Patient is doing well.  Still  having difficulty arriving on time.  30 minute session today.  R knee pain limits her a little bit today, but she is able to progress with higher level balance and strengthening activities.   She is progressing to goals.  PT remains necessary for residual strength, gait, balance deficits.  Continue per POC  EVAL: TIONA RUANE is a 63 y.o. female who was referred to physical therapy for evaluation and treatment for frequent falls.  She has experienced 2 falls in the past 6 months.  Patient presents with physical impairments of B LE weakness, impaired activity tolerance, impaired standing balance, impaired ambulation, and decreased safety awareness impacting safe and independent functional mobility.  Gait speed 3.26 ft/sec consistent with community ambulator (2.62 ft/sec is needed for community access), but shy of normal walking speed of 4.37 ft/sec.  Examination revealed patient is at risk for falls and functional decline as evidenced by the following objective test measures: TUG of 15.00 sec (>13.5 sec indicates increased risk for falls), 5xSTS of 24.78 sec (>15 sec indicates increased risk for falls and decreased BLE power), and FGA score of 14/30 (<19/30 indicates high risk for falls (.  ABC scale score of 55.6% indicates a moderate level of physical functioning with an increased risk for recurrent falls.  Paiton will benefit from skilled PT to address above deficits to improve mobility and activity tolerance to help reach the maximal level of functional independence and mobility. Patient demonstrates understanding of this POC and is in agreement with this plan.   OBJECTIVE IMPAIRMENTS: Abnormal gait, decreased activity tolerance, decreased balance, decreased coordination, decreased knowledge of condition, difficulty walking, decreased strength, dizziness, impaired flexibility, improper body mechanics, and postural dysfunction.   ACTIVITY LIMITATIONS: standing, squatting, and locomotion  level  PARTICIPATION LIMITATIONS: community activity  PERSONAL FACTORS: Fitness, Past/current experiences, Time since  onset of injury/illness/exacerbation, and 3+ comorbidities: Anemia, HTN, thyroid  disease/hypothyroidism, RA, OA, severe obesity, SLE, GERD, R breast lumpectomy, cholecystectomy 08/11/23  are also affecting patient's functional outcome.   REHAB POTENTIAL: Good  CLINICAL DECISION MAKING: Evolving/moderate complexity  EVALUATION COMPLEXITY: Moderate   GOALS: Goals reviewed with patient? Yes  SHORT TERM GOALS: Target date: 11/28/2023  Patient will be independent with initial HEP to improve outcomes and carryover.  Baseline:  10/26/23 - HEP initiated today 11/02/23 - HEP updated today  Goal status: MET - 11/30/23 pt is consistent with HEP, made modifications today  2.  Patient will be educated on strategies to decrease risk of falls.  Baseline:  Goal status: MET- 12/08/23  3.  Patient will demonstrate decreased TUG time to </= 13.5 sec to decrease risk for falls with transitional mobility. Baseline: 15.0 sec Goal status: MET - 11/09/23 - 11.64 sec  4.  Patient will improve 5xSTS time to </= 20 seconds for improved efficiency and safety with transfers. Baseline: 24.78 sec 11/16/23 20 sec no UE support Goal status: MET - 12/08/23  LONG TERM GOALS: Target date: 01/16/2024  Patient will be independent with advanced/ongoing HEP to facilitate ability to maintain/progress functional gains from skilled physical therapy services. Baseline:  Goal status: IN PROGRESS  2.  Patient will be able to ambulate 600' w/o AD on variable surfaces with good safety to access community.  Baseline:  8/28:  no device in clinic today with supervision.   Goal status: IN PROGRESS  3.  Patient will be able to step up/down curb safely with LRAD for safety with community ambulation.  Baseline:  Goal status: MET- 12/08/23 no AD, safe negotiation   4.  Patient will demonstrate improved B LE strength  to >/= 4 to 4+/5 for improved stability and ease of mobility . Baseline: Refer to above LE MMT table Goal status: IN PROGRESS- 11/30/23- met for all except hip abd, ext, and L hip ER  5.  Patient will improve 5xSTS time to </= 15 seconds for improved efficiency and safety with transfers. Baseline: 24.78 sec Goal status: IN PROGRESS - 12/08/23 16.48  6.  Patient will improve Berg score by at least 8 points or to >/= 52/56 to improve safety and stability with ADLs in standing and reduce risk for falls. (MCID= 8 points)  Baseline: 49/56 (10/26/23) Goal status: MET- 12/08/23 56/56  7.  Patient will improve FGA score to at least 19/30 to improve gait stability and reduce risk for falls. Baseline: 14/30 Goal status: MET- 12/08/23  8.  Patient will report >/= 75% on ABC scale (MCID = 19%) to demonstrate improved balance confidence with functional mobility and gait. Baseline: 890 / 1600 = 55.6 % Goal status: IN PROGRESS   PLAN:  PT FREQUENCY: 2x/week  PT DURATION: 8-12 weeks  PLANNED INTERVENTIONS: 97164- PT Re-evaluation, 97750- Physical Performance Testing, 97110-Therapeutic exercises, 97530- Therapeutic activity, 97112- Neuromuscular re-education, 97535- Self Care, [id number]- Manual therapy, [phone]- Gait training, [phone]- Aquatic Therapy, [phone]- Electrical stimulation (unattended), 97016- Vasopneumatic device, [id number]- Ionotophoresis 4mg /ml Dexamethasone , 79439 (1-2 muscles), 20561 (3+ muscles)- Dry Needling, Patient/Family education, Balance training, Stair training, Taping, Joint mobilization, DME instructions, and Cryotherapy  PLAN FOR NEXT SESSION: Assess curb and outdoor gait, complete ABC scale; prepare for d/c  Calypso Hagarty, PT 12/15/2023, 9:48 AM

## 2024-04-06 ENCOUNTER — Other Ambulatory Visit: Payer: Self-pay | Admitting: Family Medicine

## 2024-04-06 DIAGNOSIS — R1013 Epigastric pain: Secondary | ICD-10-CM

## 2024-04-30 ENCOUNTER — Encounter: Admitting: Family Medicine

## 2024-05-08 ENCOUNTER — Encounter: Payer: Self-pay | Admitting: Family Medicine

## 2024-05-08 ENCOUNTER — Other Ambulatory Visit (HOSPITAL_BASED_OUTPATIENT_CLINIC_OR_DEPARTMENT_OTHER): Payer: Self-pay

## 2024-05-08 ENCOUNTER — Ambulatory Visit: Admitting: Family Medicine

## 2024-05-08 VITALS — BP 126/76 | HR 77 | Temp 97.7°F | Resp 18 | Ht 68.0 in | Wt 288.6 lb

## 2024-05-08 DIAGNOSIS — E559 Vitamin D deficiency, unspecified: Secondary | ICD-10-CM

## 2024-05-08 DIAGNOSIS — E039 Hypothyroidism, unspecified: Secondary | ICD-10-CM | POA: Diagnosis not present

## 2024-05-08 DIAGNOSIS — E785 Hyperlipidemia, unspecified: Secondary | ICD-10-CM

## 2024-05-08 DIAGNOSIS — M329 Systemic lupus erythematosus, unspecified: Secondary | ICD-10-CM

## 2024-05-08 DIAGNOSIS — Z23 Encounter for immunization: Secondary | ICD-10-CM

## 2024-05-08 DIAGNOSIS — I10 Essential (primary) hypertension: Secondary | ICD-10-CM | POA: Diagnosis not present

## 2024-05-08 DIAGNOSIS — Z0000 Encounter for general adult medical examination without abnormal findings: Secondary | ICD-10-CM

## 2024-05-08 LAB — LIPID PANEL
Cholesterol: 145 mg/dL (ref 28–200)
HDL: 59 mg/dL
LDL Cholesterol: 75 mg/dL (ref 10–99)
NonHDL: 86.03
Total CHOL/HDL Ratio: 2
Triglycerides: 57 mg/dL (ref 10.0–149.0)
VLDL: 11.4 mg/dL (ref 0.0–40.0)

## 2024-05-08 LAB — CBC WITH DIFFERENTIAL/PLATELET
Basophils Absolute: 0.1 K/uL (ref 0.0–0.1)
Basophils Relative: 1.1 % (ref 0.0–3.0)
Eosinophils Absolute: 0.2 K/uL (ref 0.0–0.7)
Eosinophils Relative: 3.1 % (ref 0.0–5.0)
HCT: 36.3 % (ref 36.0–46.0)
Hemoglobin: 12.3 g/dL (ref 12.0–15.0)
Lymphocytes Relative: 23.2 % (ref 12.0–46.0)
Lymphs Abs: 1.1 K/uL (ref 0.7–4.0)
MCHC: 33.8 g/dL (ref 30.0–36.0)
MCV: 84.5 fl (ref 78.0–100.0)
Monocytes Absolute: 0.5 K/uL (ref 0.1–1.0)
Monocytes Relative: 10.1 % (ref 3.0–12.0)
Neutro Abs: 3.1 K/uL (ref 1.4–7.7)
Neutrophils Relative %: 62.5 % (ref 43.0–77.0)
Platelets: 210 K/uL (ref 150.0–400.0)
RBC: 4.3 Mil/uL (ref 3.87–5.11)
RDW: 13.1 % (ref 11.5–15.5)
WBC: 4.9 K/uL (ref 4.0–10.5)

## 2024-05-08 LAB — COMPREHENSIVE METABOLIC PANEL WITH GFR
ALT: 9 U/L (ref 3–35)
AST: 11 U/L (ref 5–37)
Albumin: 3.6 g/dL (ref 3.5–5.2)
Alkaline Phosphatase: 53 U/L (ref 39–117)
BUN: 18 mg/dL (ref 6–23)
CO2: 32 meq/L (ref 19–32)
Calcium: 8.9 mg/dL (ref 8.4–10.5)
Chloride: 103 meq/L (ref 96–112)
Creatinine, Ser: 1.04 mg/dL (ref 0.40–1.20)
GFR: 57.27 mL/min — ABNORMAL LOW
Glucose, Bld: 79 mg/dL (ref 70–99)
Potassium: 3.2 meq/L — ABNORMAL LOW (ref 3.5–5.1)
Sodium: 140 meq/L (ref 135–145)
Total Bilirubin: 0.8 mg/dL (ref 0.2–1.2)
Total Protein: 6.3 g/dL (ref 6.0–8.3)

## 2024-05-08 LAB — VITAMIN D 25 HYDROXY (VIT D DEFICIENCY, FRACTURES): VITD: 49.02 ng/mL (ref 30.00–100.00)

## 2024-05-08 LAB — TSH: TSH: 2.64 u[IU]/mL (ref 0.35–5.50)

## 2024-05-08 MED ORDER — COMIRNATY 30 MCG/0.3ML IM SUSY
0.3000 mL | PREFILLED_SYRINGE | Freq: Once | INTRAMUSCULAR | 0 refills | Status: AC
  Filled 2024-05-08: qty 0.3, 1d supply, fill #0

## 2024-05-08 NOTE — Patient Instructions (Signed)
 Preventive Care 64-64 Years Old, Female  Preventive care refers to lifestyle choices and visits with your health care provider that can promote health and wellness. Preventive care visits are also called wellness exams.  What can I expect for my preventive care visit?  Counseling  Your health care provider may ask you questions about your:  Medical history, including:  Past medical problems.  Family medical history.  Pregnancy history.  Current health, including:  Menstrual cycle.  Method of birth control.  Emotional well-being.  Home life and relationship well-being.  Sexual activity and sexual health.  Lifestyle, including:  Alcohol, nicotine or tobacco, and drug use.  Access to firearms.  Diet, exercise, and sleep habits.  Work and work Astronomer.  Sunscreen use.  Safety issues such as seatbelt and bike helmet use.  Physical exam  Your health care provider will check your:  Height and weight. These may be used to calculate your BMI (body mass index). BMI is a measurement that tells if you are at a healthy weight.  Waist circumference. This measures the distance around your waistline. This measurement also tells if you are at a healthy weight and may help predict your risk of certain diseases, such as type 2 diabetes and high blood pressure.  Heart rate and blood pressure.  Body temperature.  Skin for abnormal spots.  What immunizations do I need?    Vaccines are usually given at 64 ages, according to a schedule. Your health care provider will recommend vaccines for you based on your age, medical history, and lifestyle or other factors, such as travel or where you work.  What tests do I need?  Screening  Your health care provider may recommend screening tests for certain conditions. This may include:  Lipid and cholesterol levels.  Diabetes screening. This is done by checking your blood sugar (glucose) after you have not eaten for a while (fasting).  Pelvic exam and Pap test.  Hepatitis B test.  Hepatitis C  test.  HIV (human immunodeficiency virus) test.  STI (sexually transmitted infection) testing, if you are at risk.  Lung cancer screening.  Colorectal cancer screening.  Mammogram. Talk with your health care provider about when you should start having regular mammograms. This may depend on whether you have a family history of breast cancer.  BRCA-related cancer screening. This may be done if you have a family history of breast, ovarian, tubal, or peritoneal cancers.  Bone density scan. This is done to screen for osteoporosis.  Talk with your health care provider about your test results, treatment options, and if necessary, the need for more tests.  Follow these instructions at home:  Eating and drinking    Eat a diet that includes fresh fruits and vegetables, whole grains, lean protein, and low-fat dairy products.  Take vitamin and mineral supplements as recommended by your health care provider.  Do not drink alcohol if:  Your health care provider tells you not to drink.  You are pregnant, may be pregnant, or are planning to become pregnant.  If you drink alcohol:  Limit how much you have to 0-1 drink a day.  Know how much alcohol is in your drink. In the [place] one drink equals one 12 oz bottle of beer (355 mL), one 5 oz glass of wine (148 mL), or one 1 oz glass of hard liquor (44 mL).  Lifestyle  Brush your teeth every morning and night with fluoride toothpaste. Floss one time each day.  Exercise for at least  30 minutes 5 or more days each week.  Do not use any products that contain nicotine or tobacco. These products include cigarettes, chewing tobacco, and vaping devices, such as e-cigarettes. If you need help quitting, ask your health care provider.  Do not use drugs.  If you are sexually active, practice safe sex. Use a condom or other form of protection to prevent STIs.  If you do not wish to become pregnant, use a form of birth control. If you plan to become pregnant, see your health care provider for a  prepregnancy visit.  Take aspirin only as told by your health care provider. Make sure that you understand how much to take and what form to take. Work with your health care provider to find out whether it is safe and beneficial for you to take aspirin daily.  Find healthy ways to manage stress, such as:  Meditation, yoga, or listening to music.  Journaling.  Talking to a trusted person.  Spending time with friends and family.  Minimize exposure to UV radiation to reduce your risk of skin cancer.  Safety  Always wear your seat belt while driving or riding in a vehicle.  Do not drive:  If you have been drinking alcohol. Do not ride with someone who has been drinking.  When you are tired or distracted.  While texting.  If you have been using any mind-altering substances or drugs.  Wear a helmet and other protective equipment during sports activities.  If you have firearms in your house, make sure you follow all gun safety procedures.  Seek help if you have been physically or sexually abused.  What's next?  Visit your health care provider once a year for an annual wellness visit.  Ask your health care provider how often you should have your eyes and teeth checked.  Stay up to date on all vaccines.  This information is not intended to replace advice given to you by your health care provider. Make sure you discuss any questions you have with your health care provider.  Document Revised: 10/01/2020 Document Reviewed: 10/01/2020  Elsevier Patient Education  2024 [employer].

## 2024-05-08 NOTE — Assessment & Plan Note (Signed)
 Ghm utd Check labs  See AVS  Health Maintenance  Topic Date Due   Pneumococcal Vaccine: 50+ Years (1 of 1 - PCV) Never done   COVID-19 Vaccine (7 - 2025-26 season) 12/19/2023   Colonoscopy  08/02/2024   Mammogram  11/21/2024   DTaP/Tdap/Td (3 - Td or Tdap) 01/13/2028   Influenza Vaccine  Completed   HPV VACCINES (No Doses Required) Completed   Hepatitis C Screening  Completed   HIV Screening  Completed   Zoster Vaccines- Shingrix   Completed   Hepatitis B Vaccines 19-59 Average Risk  Aged Out   Meningococcal B Vaccine  Aged Out

## 2024-05-08 NOTE — Assessment & Plan Note (Signed)
Check labs Cont' meds 

## 2024-05-08 NOTE — Progress Notes (Signed)
 "  Subjective:    Patient ID: [name], female    DOB: 1960-09-20, 64 y.o.   MRN: [id number]  Chief Complaint  Patient presents with   Annual Exam    Pt states fasting     HPI Patient is in today for cpe.  Discussed the use of AI scribe software for clinical note transcription with the patient, who gave verbal consent to proceed.  History of Present Illness [name] is a 64 year old female who presents for an annual physical exam and flu vaccination.  She inquires about the pneumonia vaccine, noting she has never received it.  She experiences a burning sensation in her throat that began after drinking tea a couple of days ago. No burping is present, and the tea was not excessively hot.  She reports a sensation of discomfort in her abdomen since her gallbladder removal in late 2024. She describes it as feeling rough and like something empty is inside, but denies pain, burping, or diarrhea. She does not take any medication for this sensation.  Her family history includes a niece in her mid-twenties who has recently started experiencing seizures again, having had them as a child.  She maintains regular physical activity by walking and has upcoming appointments with her eye doctor and OB/GYN. She visits the dentist every six months.    Past Medical History:  Diagnosis Date   Abdominal pain, other specified site    Acute upper respiratory infections of unspecified site    Allergy    seasonal   Anemia    Benign neoplasm of skin, site unspecified    Disturbance of skin sensation    Diverticulosis of colon (without mention of hemorrhage)    Family history of diabetes mellitus    Family history of ischemic heart disease    Family history of malignant neoplasm of genital organ, other    GERD (gastroesophageal reflux disease)    Hypertension    Hypokalemia    Hypothyroidism    Left shoulder pain    Low blood potassium    Post-menopausal    Rheumatoid arthritis (HCC)     Sleep apnea    wears cpap    Sprain and strain of unspecified site of knee and leg    Sprain of neck    Thyroid  disease    Unspecified essential hypertension     Past Surgical History:  Procedure Laterality Date   ABDOMINAL HYSTERECTOMY     BREAST BIOPSY Right 10/30/2021   BREAST BIOPSY Left 11/28/2023   MM LT BREAST BX W LOC DEV 1ST LESION IMAGE BX SPEC STEREO GUIDE 11/28/2023 [facility]   BREAST LUMPECTOMY WITH RADIOACTIVE SEED LOCALIZATION Right 12/30/2021   Procedure: RIGHT BREAST LUMPECTOMY WITH RADIOACTIVE SEED LOCALIZATION;  Surgeon: Vanderbilt Ned, MD;  Location: [facility];  Service: General;  Laterality: Right;   BUNIONECTOMY  2005   right foot   CHOLECYSTECTOMY N/A 08/11/2023   Procedure: LAPAROSCOPIC CHOLECYSTECTOMY;  Surgeon: Vanderbilt Ned, MD;  Location: [facility];  Service: General;  Laterality: N/A;  WITH ICG DYE   COLONOSCOPY  6-12/2008   SHOULDER SURGERY     left;spurs 2-09- dr cayetano   TOTAL ABDOMINAL HYSTERECTOMY W/ BILATERAL SALPINGOOPHORECTOMY  2002    Family History  Problem Relation Age of Onset   Diabetes Mother    Hypertension Mother    Dementia Mother    Stroke Mother    Prostate cancer Father    Hypertension Father    Cancer  Father    Hypertension Sister    Diabetes Brother    Stroke Maternal Aunt    Breast cancer Maternal Aunt    Breast cancer Cousin    Seizures Niece    Colon cancer Neg Hx    Colon polyps Neg Hx    Esophageal cancer Neg Hx    Rectal cancer Neg Hx    Stomach cancer Neg Hx     Social History   Socioeconomic History   Marital status: Married    Spouse name: Not on file   Number of children: 0   Years of education: Not on file   Highest education level: Associate degree: occupational, scientist, product/process development, or vocational program  Occupational History   Occupation: FORENSIC SCIENTIST CLERK    Employer: [employer]   Occupation: retired    Comment: 03/18/2021  Tobacco Use   Smoking status: Never   Smokeless  tobacco: Never  Vaping Use   Vaping status: Never Used  Substance and Sexual Activity   Alcohol use: No   Drug use: No   Sexual activity: Yes    Partners: Male  Other Topics Concern   Not on file  Social History Narrative   Exercise -- walking 15 min x 3 a day    Social Drivers of Health   Tobacco Use: Low Risk (05/08/2024)   Patient History    Smoking Tobacco Use: Never    Smokeless Tobacco Use: Never    Passive Exposure: Not on file  Financial Resource Strain: Low Risk (05/08/2024)   Overall Financial Resource Strain (CARDIA)    Difficulty of Paying Living Expenses: Not hard at all  Food Insecurity: No Food Insecurity (05/08/2024)   Epic    Worried About Radiation Protection Practitioner of Food in the Last Year: Never true    Ran Out of Food in the Last Year: Never true  Transportation Needs: No Transportation Needs (05/08/2024)   Epic    Lack of Transportation (Medical): No    Lack of Transportation (Non-Medical): No  Physical Activity: Insufficiently Active (05/08/2024)   Exercise Vital Sign    Days of Exercise per Week: 3 days    Minutes of Exercise per Session: 20 min  Stress: No Stress Concern Present (05/08/2024)   [employer] of Occupational Health - Occupational Stress Questionnaire    Feeling of Stress: Not at all  Social Connections: Socially Integrated (05/08/2024)   Social Connection and Isolation Panel    Frequency of Communication with Friends and Family: More than three times a week    Frequency of Social Gatherings with Friends and Family: More than three times a week    Attends Religious Services: More than 4 times per year    Active Member of Clubs or Organizations: Yes    Attends Banker Meetings: More than 4 times per year    Marital Status: Married  Catering Manager Violence: Not on file  Depression (PHQ2-9): Low Risk (05/08/2024)   Depression (PHQ2-9)    PHQ-2 Score: 0  Alcohol Screen: Low Risk (05/08/2024)   Alcohol Screen    Last Alcohol Screening  Score (AUDIT): 1  Housing: High Risk (05/08/2024)   Epic    Unable to Pay for Housing in the Last Year: Yes    Number of Times Moved in the Last Year: 0    Homeless in the Last Year: No  Utilities: Not on file  Health Literacy: Not on file    Outpatient Medications Prior to Visit  Medication Sig Dispense Refill  amLODipine  (NORVASC ) 10 MG tablet Take 1 tablet (10 mg total) by mouth daily. 90 tablet 3   Cholecalciferol (VITAMIN D ) 50 MCG (2000 UT) CAPS Take 1 capsule (2,000 Units total) by mouth daily. 30 capsule 0   estradiol (ESTRACE) 2 MG tablet Take 1 mg by mouth as needed.     furosemide  (LASIX ) 40 MG tablet 1 every 3 days if needed. 90 tablet 1   hydrochlorothiazide  (HYDRODIURIL ) 25 MG tablet Take 1 tablet (25 mg total) by mouth daily. 90 tablet 3   hydroxychloroquine  (PLAQUENIL ) 200 MG tablet Take 400 mg by mouth at bedtime.     losartan  (COZAAR ) 100 MG tablet Take 1 tablet (100 mg total) by mouth daily. 90 tablet 3   naproxen  sodium (ALEVE ) 220 MG tablet Take 440 mg by mouth daily as needed (pain).     omeprazole  (PRILOSEC) 20 MG capsule TAKE 1 CAPSULE BY MOUTH DAILY 30 capsule 2   Polyethyl Glycol-Propyl Glycol (SYSTANE OP) Place 2 drops into both eyes in the morning and at bedtime.     potassium chloride  (KLOR-CON  M) 10 MEQ tablet Take 2 tablets (20 mEq total) by mouth 2 (two) times daily. 120 tablet 5   triamcinolone  cream (KENALOG ) 0.1 % Apply 1 Application topically 2 (two) times daily. 30 g 3   oxyCODONE  (OXY IR/ROXICODONE ) 5 MG immediate release tablet Take 1 tablet (5 mg total) by mouth every 6 (six) hours as needed for severe pain (pain score 7-10). (Patient not taking: Reported on 10/24/2023) 15 tablet 0   No facility-administered medications prior to visit.    No Known Allergies  Review of Systems  Constitutional:  Negative for fever and malaise/fatigue.  HENT:  Negative for congestion.   Eyes:  Negative for blurred vision.  Respiratory:  Negative for shortness of  breath.   Cardiovascular:  Negative for chest pain, palpitations and leg swelling.  Gastrointestinal:  Negative for abdominal pain, blood in stool and nausea.  Genitourinary:  Negative for dysuria and frequency.  Musculoskeletal:  Negative for falls.  Skin:  Negative for rash.  Neurological:  Negative for dizziness, loss of consciousness and headaches.  Endo/Heme/Allergies:  Negative for environmental allergies.  Psychiatric/Behavioral:  Negative for depression. The patient is not nervous/anxious.        Objective:    Physical Exam Vitals and nursing note reviewed.  Constitutional:      General: She is not in acute distress.    Appearance: Normal appearance. She is well-developed.  HENT:     Head: Normocephalic and atraumatic.     Right Ear: Tympanic membrane, ear canal and external ear normal. There is no impacted cerumen.     Left Ear: Tympanic membrane, ear canal and external ear normal. There is no impacted cerumen.     Nose: Nose normal.     Mouth/Throat:     Mouth: Mucous membranes are moist.     Pharynx: Oropharynx is clear. No oropharyngeal exudate or posterior oropharyngeal erythema.  Eyes:     General: No scleral icterus.       Right eye: No discharge.        Left eye: No discharge.     Conjunctiva/sclera: Conjunctivae normal.     Pupils: Pupils are equal, round, and reactive to light.  Neck:     Thyroid : No thyromegaly or thyroid  tenderness.     Vascular: No JVD.  Cardiovascular:     Rate and Rhythm: Normal rate and regular rhythm.     Heart sounds: Normal  heart sounds. No murmur heard. Pulmonary:     Effort: Pulmonary effort is normal. No respiratory distress.     Breath sounds: Normal breath sounds.  Abdominal:     General: Bowel sounds are normal. There is no distension.     Palpations: Abdomen is soft. There is no mass.     Tenderness: There is no abdominal tenderness. There is no guarding or rebound.  Genitourinary:    Vagina: Normal.  Musculoskeletal:         General: Normal range of motion.     Cervical back: Normal range of motion and neck supple.     Right lower leg: No edema.     Left lower leg: No edema.  Lymphadenopathy:     Cervical: No cervical adenopathy.  Skin:    General: Skin is warm and dry.     Findings: No erythema or rash.  Neurological:     Mental Status: She is alert and oriented to person, place, and time.     Cranial Nerves: No cranial nerve deficit.     Deep Tendon Reflexes: Reflexes are normal and symmetric.  Psychiatric:        Mood and Affect: Mood normal.        Behavior: Behavior normal.        Thought Content: Thought content normal.        Judgment: Judgment normal.     BP 126/76 (BP Location: Left Arm, Patient Position: Sitting, Cuff Size: Large)   Pulse 77   Temp 97.7 F (36.5 C) (Oral)   Resp 18   Ht 5' 8 (1.727 m)   Wt 288 lb 9.6 oz (130.9 kg)   SpO2 99%   BMI 43.88 kg/m  Wt Readings from Last 3 Encounters:  05/08/24 288 lb 9.6 oz (130.9 kg)  10/10/23 300 lb (136.1 kg)  08/17/23 295 lb (133.8 kg)    Diabetic Foot Exam - Simple   No data filed    Lab Results  Component Value Date   WBC 4.3 10/11/2023   HGB 11.9 (L) 10/11/2023   HCT 36.2 10/11/2023   PLT 256.0 10/11/2023   GLUCOSE 89 10/11/2023   CHOL 178 10/11/2023   TRIG 63.0 10/11/2023   HDL 58.60 10/11/2023   LDLDIRECT 128.5 04/30/2008   LDLCALC 107 (H) 10/11/2023   ALT 11 10/11/2023   AST 14 10/11/2023   NA 141 10/11/2023   K 3.8 10/11/2023   CL 108 10/11/2023   CREATININE 0.99 10/11/2023   BUN 18 10/11/2023   CO2 29 10/11/2023   TSH 3.11 10/11/2023   INR 1.1 (H) 06/01/2023   HGBA1C 5.1 04/04/2023    Lab Results  Component Value Date   TSH 3.11 10/11/2023   Lab Results  Component Value Date   WBC 4.3 10/11/2023   HGB 11.9 (L) 10/11/2023   HCT 36.2 10/11/2023   MCV 85.0 10/11/2023   PLT 256.0 10/11/2023   Lab Results  Component Value Date   NA 141 10/11/2023   K 3.8 10/11/2023   CO2 29 10/11/2023    GLUCOSE 89 10/11/2023   BUN 18 10/11/2023   CREATININE 0.99 10/11/2023   BILITOT 0.9 10/11/2023   ALKPHOS 71 10/11/2023   AST 14 10/11/2023   ALT 11 10/11/2023   PROT 6.7 10/11/2023   ALBUMIN 3.8 10/11/2023   CALCIUM 9.1 10/11/2023   ANIONGAP 7 08/03/2023   GFR 61.00 10/11/2023   Lab Results  Component Value Date   CHOL 178 10/11/2023  Lab Results  Component Value Date   HDL 58.60 10/11/2023   Lab Results  Component Value Date   LDLCALC 107 (H) 10/11/2023   Lab Results  Component Value Date   TRIG 63.0 10/11/2023   Lab Results  Component Value Date   CHOLHDL 3 10/11/2023   Lab Results  Component Value Date   HGBA1C 5.1 04/04/2023       Assessment & Plan:  Preventative health care Assessment & Plan: Ghm utd Check labs  See AVS  Health Maintenance  Topic Date Due   Pneumococcal Vaccine: 50+ Years (1 of 1 - PCV) Never done   COVID-19 Vaccine (7 - 2025-26 season) 12/19/2023   Colonoscopy  08/02/2024   Mammogram  11/21/2024   DTaP/Tdap/Td (3 - Td or Tdap) 01/13/2028   Influenza Vaccine  Completed   HPV VACCINES (No Doses Required) Completed   Hepatitis C Screening  Completed   HIV Screening  Completed   Zoster Vaccines- Shingrix   Completed   Hepatitis B Vaccines 19-59 Average Risk  Aged Out   Meningococcal B Vaccine  Aged Out     Orders: -     CBC with Differential/Platelet -     Comprehensive metabolic panel with GFR -     Lipid panel -     TSH  Essential hypertension Assessment & Plan: Well controlled, no changes to meds. Encouraged heart healthy diet such as the DASH diet and exercise as tolerated.    Orders: -     CBC with Differential/Platelet -     Comprehensive metabolic panel with GFR -     TSH  Hyperlipidemia, unspecified hyperlipidemia type Assessment & Plan: Encourage heart healthy diet such as MIND or DASH diet, increase exercise, avoid trans fats, simple carbohydrates and processed foods, consider a krill or fish or flaxseed oil  cap daily.    Orders: -     Comprehensive metabolic panel with GFR -     Lipid panel  Acquired hypothyroidism Assessment & Plan: Check labs Con't meds  Orders: -     TSH  Vitamin D  deficiency -     VITAMIN D  25 Hydroxy (Vit-D Deficiency, Fractures)  SLE (systemic lupus erythematosus related syndrome) (HCC) Assessment & Plan: Per rheum   Need for influenza vaccination -     Flu vaccine trivalent PF, 6mos and older(Flulaval,Afluria,Fluarix,Fluzone)  Assessment and Plan Assessment & Plan Influenza vaccination   She was due for her annual influenza vaccination with no contraindications reported. Administered influenza vaccine today. Assessment and Plan Assessment & Plan Influenza vaccination   She was due for her annual influenza vaccination with no contraindications reported. Administered influenza vaccine today.  g6   Mattison Stuckey R Lowne Chase, DO  "

## 2024-05-08 NOTE — Assessment & Plan Note (Signed)
Per rheum 

## 2024-05-08 NOTE — Assessment & Plan Note (Signed)
 Encourage heart healthy diet such as MIND or DASH diet, increase exercise, avoid trans fats, simple carbohydrates and processed foods, consider a krill or fish or flaxseed oil cap daily.

## 2024-05-08 NOTE — Assessment & Plan Note (Signed)
 Well controlled, no changes to meds. Encouraged heart healthy diet such as the DASH diet and exercise as tolerated.

## 2024-05-09 ENCOUNTER — Ambulatory Visit: Payer: Self-pay | Admitting: Family Medicine

## 2024-05-09 MED ORDER — POTASSIUM CHLORIDE CRYS ER 20 MEQ PO TBCR: 20.0000 meq | EXTENDED_RELEASE_TABLET | Freq: Every day | ORAL | 2 refills | Status: AC

## 2024-11-05 ENCOUNTER — Ambulatory Visit: Admitting: Family Medicine
